# Patient Record
Sex: Female | Born: 1980 | Race: White | Hispanic: No | Smoking: Current some day smoker
Health system: Southern US, Community
[De-identification: ages and names within clinical notes are randomized; demographics above are authoritative.]

## PROBLEM LIST (undated history)

## (undated) DIAGNOSIS — G43909 Migraine, unspecified, not intractable, without status migrainosus: Secondary | ICD-10-CM

## (undated) DIAGNOSIS — Z9221 Personal history of antineoplastic chemotherapy: Secondary | ICD-10-CM

## (undated) DIAGNOSIS — D649 Anemia, unspecified: Secondary | ICD-10-CM

## (undated) DIAGNOSIS — Z923 Personal history of irradiation: Secondary | ICD-10-CM

## (undated) DIAGNOSIS — C801 Malignant (primary) neoplasm, unspecified: Secondary | ICD-10-CM

## (undated) DIAGNOSIS — Z8541 Personal history of malignant neoplasm of cervix uteri: Secondary | ICD-10-CM

## (undated) DIAGNOSIS — N189 Chronic kidney disease, unspecified: Secondary | ICD-10-CM

## (undated) DIAGNOSIS — N135 Crossing vessel and stricture of ureter without hydronephrosis: Secondary | ICD-10-CM

## (undated) HISTORY — PX: MANDIBLE FRACTURE SURGERY: SHX706

## (undated) HISTORY — PX: CHOLECYSTECTOMY: SHX55

---

## 2003-01-31 ENCOUNTER — Emergency Department (HOSPITAL_COMMUNITY): Admission: EM | Admit: 2003-01-31 | Discharge: 2003-01-31 | Payer: Self-pay | Admitting: Emergency Medicine

## 2003-02-22 ENCOUNTER — Encounter: Payer: Self-pay | Admitting: *Deleted

## 2003-02-22 ENCOUNTER — Emergency Department (HOSPITAL_COMMUNITY): Admission: EM | Admit: 2003-02-22 | Discharge: 2003-02-22 | Payer: Self-pay | Admitting: *Deleted

## 2003-04-09 ENCOUNTER — Inpatient Hospital Stay (HOSPITAL_COMMUNITY): Admission: EM | Admit: 2003-04-09 | Discharge: 2003-04-11 | Payer: Self-pay | Admitting: Emergency Medicine

## 2003-04-09 HISTORY — PX: INCISION AND DRAINAGE ABSCESS: SHX5864

## 2004-09-25 ENCOUNTER — Emergency Department (HOSPITAL_COMMUNITY): Admission: EM | Admit: 2004-09-25 | Discharge: 2004-09-25 | Payer: Self-pay | Admitting: Emergency Medicine

## 2007-01-13 ENCOUNTER — Emergency Department (HOSPITAL_COMMUNITY): Admission: EM | Admit: 2007-01-13 | Discharge: 2007-01-13 | Payer: Self-pay | Admitting: Emergency Medicine

## 2007-05-07 ENCOUNTER — Emergency Department (HOSPITAL_COMMUNITY): Admission: EM | Admit: 2007-05-07 | Discharge: 2007-05-07 | Payer: Self-pay | Admitting: Emergency Medicine

## 2007-09-24 ENCOUNTER — Emergency Department (HOSPITAL_COMMUNITY): Admission: EM | Admit: 2007-09-24 | Discharge: 2007-09-24 | Payer: Self-pay | Admitting: Emergency Medicine

## 2007-10-03 ENCOUNTER — Emergency Department (HOSPITAL_COMMUNITY): Admission: EM | Admit: 2007-10-03 | Discharge: 2007-10-04 | Payer: Self-pay | Admitting: Emergency Medicine

## 2007-10-14 ENCOUNTER — Emergency Department (HOSPITAL_COMMUNITY): Admission: EM | Admit: 2007-10-14 | Discharge: 2007-10-14 | Payer: Self-pay | Admitting: Emergency Medicine

## 2007-10-23 ENCOUNTER — Emergency Department (HOSPITAL_COMMUNITY): Admission: EM | Admit: 2007-10-23 | Discharge: 2007-10-23 | Payer: Self-pay | Admitting: Emergency Medicine

## 2007-10-28 ENCOUNTER — Emergency Department (HOSPITAL_COMMUNITY): Admission: EM | Admit: 2007-10-28 | Discharge: 2007-10-29 | Payer: Self-pay | Admitting: Emergency Medicine

## 2007-11-12 ENCOUNTER — Emergency Department (HOSPITAL_COMMUNITY): Admission: EM | Admit: 2007-11-12 | Discharge: 2007-11-12 | Payer: Self-pay | Admitting: Emergency Medicine

## 2007-11-24 ENCOUNTER — Emergency Department (HOSPITAL_COMMUNITY): Admission: EM | Admit: 2007-11-24 | Discharge: 2007-11-24 | Payer: Self-pay | Admitting: Emergency Medicine

## 2007-12-29 ENCOUNTER — Emergency Department (HOSPITAL_COMMUNITY): Admission: EM | Admit: 2007-12-29 | Discharge: 2007-12-29 | Payer: Self-pay | Admitting: Emergency Medicine

## 2008-02-10 ENCOUNTER — Emergency Department (HOSPITAL_COMMUNITY): Admission: EM | Admit: 2008-02-10 | Discharge: 2008-02-10 | Payer: Self-pay | Admitting: Emergency Medicine

## 2008-04-28 ENCOUNTER — Emergency Department (HOSPITAL_COMMUNITY): Admission: EM | Admit: 2008-04-28 | Discharge: 2008-04-28 | Payer: Self-pay | Admitting: Emergency Medicine

## 2008-08-03 ENCOUNTER — Emergency Department (HOSPITAL_COMMUNITY): Admission: EM | Admit: 2008-08-03 | Discharge: 2008-08-03 | Payer: Self-pay | Admitting: Emergency Medicine

## 2008-09-21 IMAGING — CR DG ABDOMEN ACUTE W/ 1V CHEST
3 series · 3 of 3 positions shown · non-contrast
Comparison: No abdomen films.  Chest x-ray 01/13/2007

CLINICAL DATA: Abdominal pain, nausea and vomiting

ACUTE ABDOMEN SERIES (ABDOMEN 2 VIEW & CHEST 1 VIEW)

[view not recorded (1 of 3)]
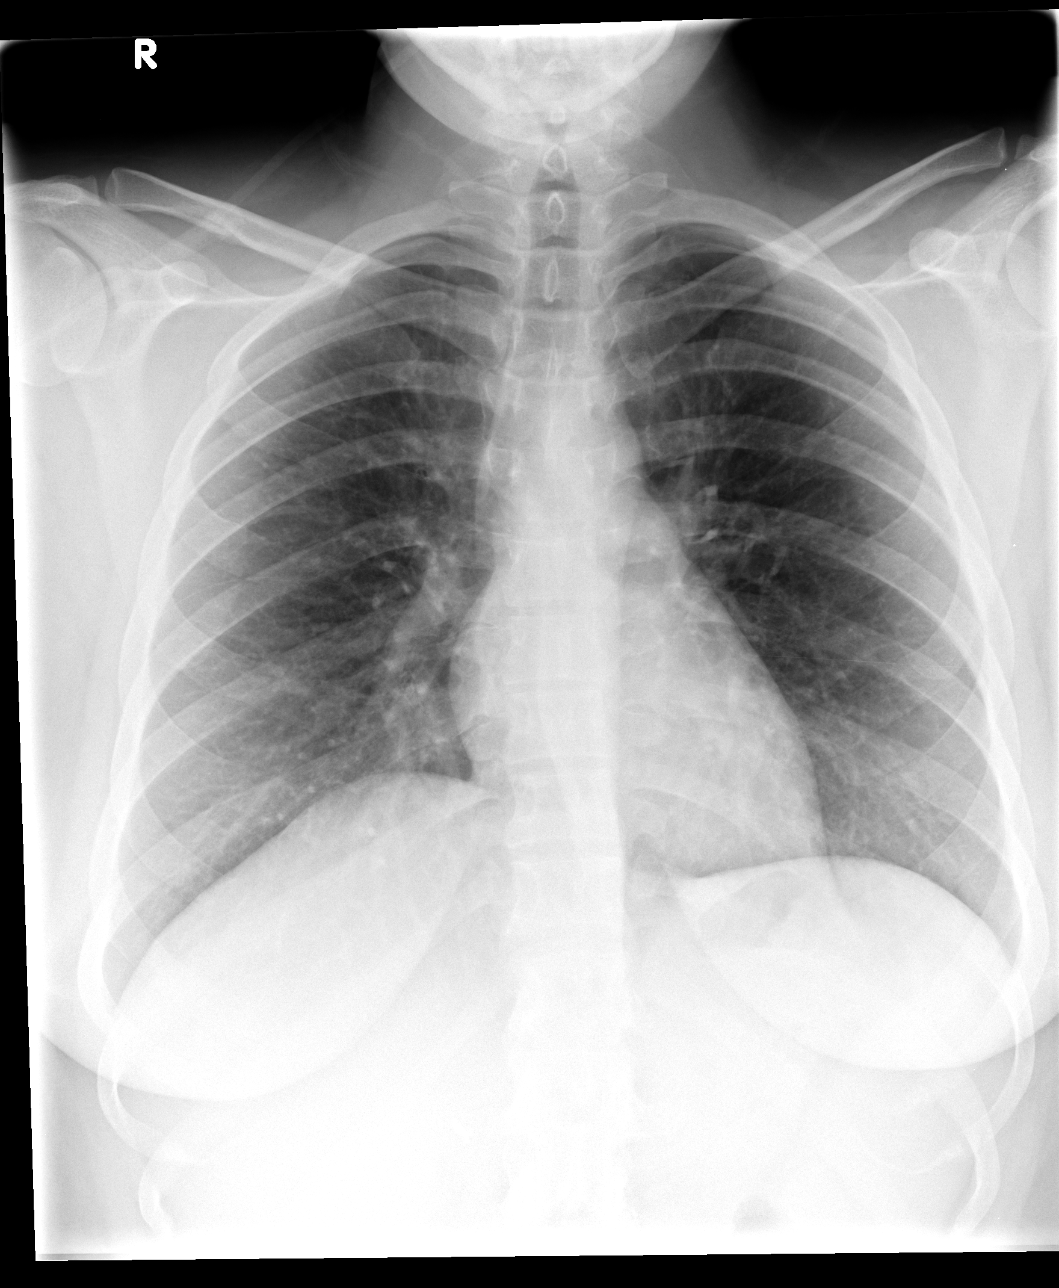

[view not recorded (2 of 3)]
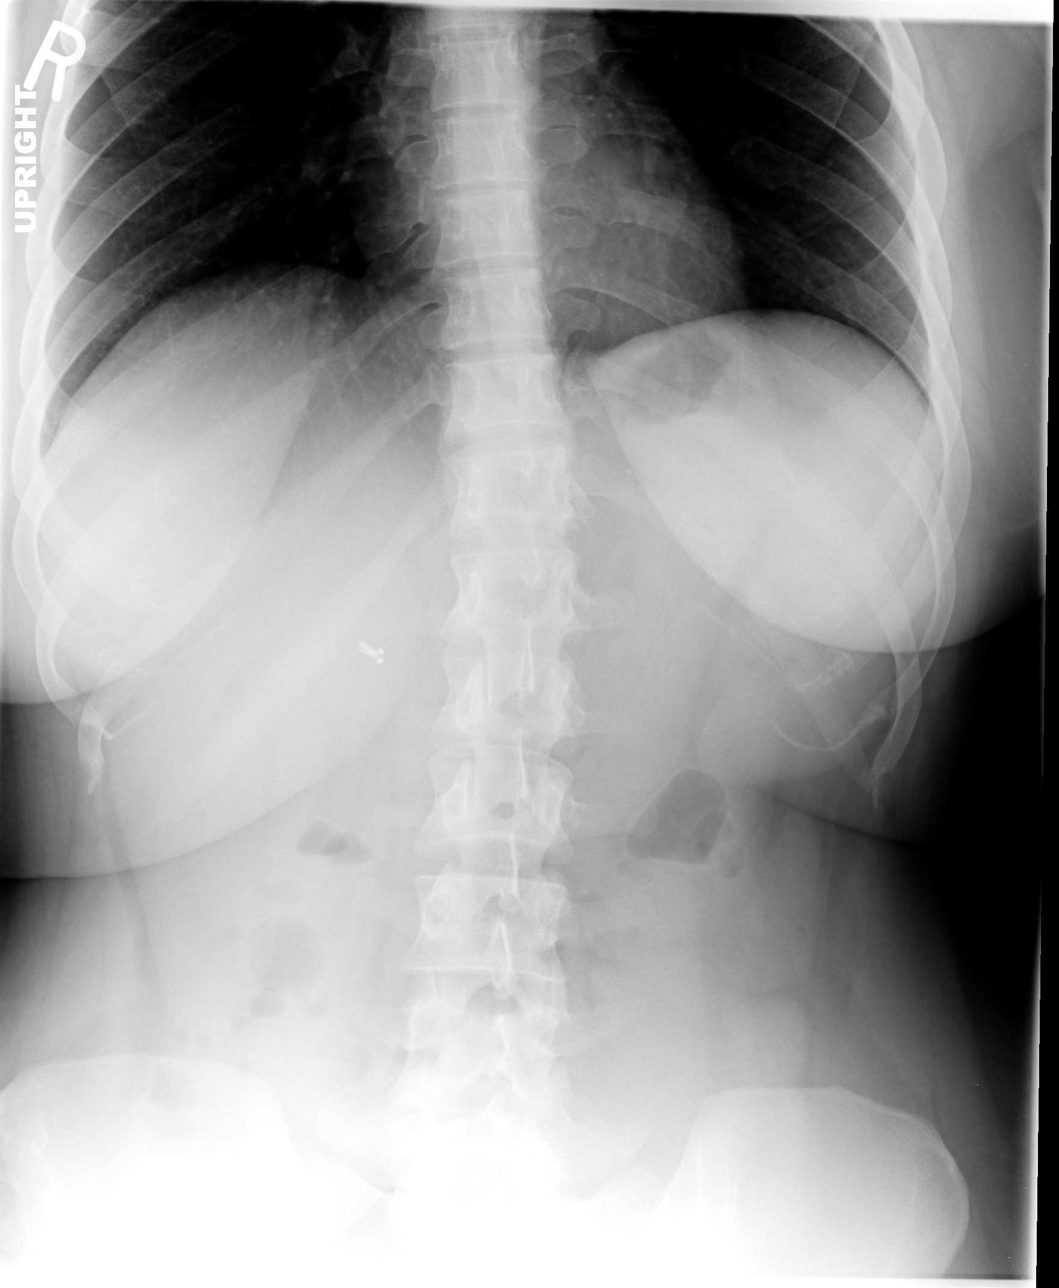

[view not recorded (3 of 3)]
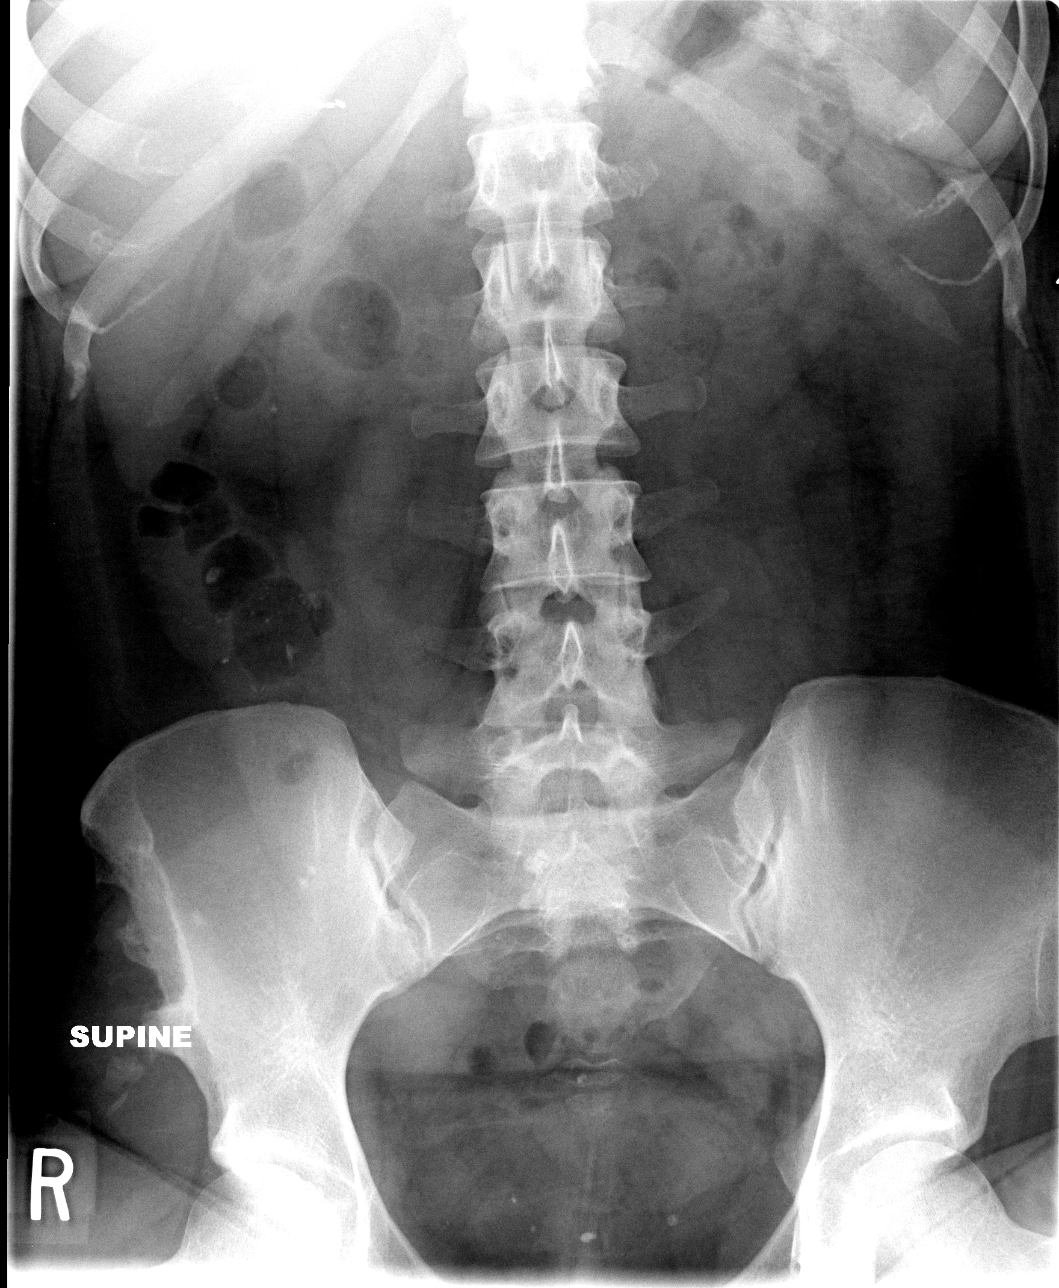

[3 of 3 positions shown; findings below may reference images not displayed]

FINDINGS: The lungs are clear.  The cardiac and mediastinal
silhouette is normal.  There is no bony abnormality. No change from
prior.

Intestinal gas pattern nonobstructive.  No evidence for free air.
Previous cholecystectomy.  Radiopaque material overlies the colon
on the right, likely retained barium or ingested food.  No acute
osseous findings. Popcorn like calcification overlying the right
sacrum likely represents a small calcified lymph node.
IMPRESSION: Negative.

## 2008-10-09 ENCOUNTER — Emergency Department (HOSPITAL_COMMUNITY): Admission: EM | Admit: 2008-10-09 | Discharge: 2008-10-09 | Payer: Self-pay | Admitting: Emergency Medicine

## 2009-08-31 DIAGNOSIS — Z8541 Personal history of malignant neoplasm of cervix uteri: Secondary | ICD-10-CM

## 2009-08-31 HISTORY — DX: Personal history of malignant neoplasm of cervix uteri: Z85.41

## 2010-11-10 LAB — DIFFERENTIAL
Basophils Absolute: 0 10*3/uL (ref 0.0–0.1)
Lymphocytes Relative: 15 % (ref 12–46)
Monocytes Absolute: 0.2 10*3/uL (ref 0.1–1.0)
Monocytes Relative: 2 % — ABNORMAL LOW (ref 3–12)
Neutro Abs: 9.6 10*3/uL — ABNORMAL HIGH (ref 1.7–7.7)

## 2010-11-10 LAB — BASIC METABOLIC PANEL
Calcium: 9.5 mg/dL (ref 8.4–10.5)
GFR calc Af Amer: >60 mL/min (ref >60)
GFR calc non Af Amer: >60 mL/min (ref >60)
Sodium: 141 mEq/L (ref 135–145)

## 2010-11-10 LAB — CBC
Hemoglobin: 13.2 g/dL (ref 12.0–15.0)
RBC: 4.22 MIL/uL (ref 3.87–5.11)
RDW: 14.1 % (ref 11.5–15.5)
WBC: 11.5 10*3/uL — ABNORMAL HIGH (ref 4.0–10.5)

## 2010-11-10 LAB — URINE MICROSCOPIC-ADD ON

## 2010-11-10 LAB — URINALYSIS, ROUTINE W REFLEX MICROSCOPIC
Hgb urine dipstick: NEGATIVE
Specific Gravity, Urine: >1.03 — ABNORMAL HIGH (ref 1.005–1.030)
Urobilinogen, UA: 0.2 mg/dL (ref 0.0–1.0)

## 2010-11-10 LAB — RAPID URINE DRUG SCREEN, HOSP PERFORMED
Amphetamines: NOT DETECTED
Barbiturates: NOT DETECTED
Tetrahydrocannabinol: POSITIVE — AB

## 2010-12-16 NOTE — Op Note (Signed)
NAME:  Brewer, Marie                         ACCOUNT NO.:  [id number]   MEDICAL RECORD NO.:  [id number]                   PATIENT TYPE:  INP   LOCATION:  6123                                 FACILITY:  [facility]   PHYSICIAN:  Kathlene November., D.D.S.        DATE OF BIRTH:  [birth year]   DATE OF PROCEDURE:  04/09/2003  DATE OF DISCHARGE:                                 OPERATIVE REPORT   PREOPERATIVE DIAGNOSIS:  Right submandibular space abscess with multiple  necrotic and abscessed teeth including teeth numbers 30, 31, 32, 17, 18, and  19.   POSTOPERATIVE DIAGNOSIS:  Right submandibular space abscess with multiple  necrotic and abscessed teeth including teeth numbers 30, 31, 32, 17, 18, and  19.   OPERATION PERFORMED:  Extraoral incision and drainage of right submandibular  and masticator spaces and extraction of teeth 17, 18, 19, 30, 31 and 32.   SURGEON:  Verneda Skill, D.D.S.   ANESTHESIA:  General orotracheal anesthesia.   INDICATIONS FOR PROCEDURE:  Marie Brewer is an otherwise healthy 30 year old female  who presented to the [facility] Emergency Department early in the morning of  April 09, 2003 with massive right facial swelling and pain.  Prompt  surgical incision and drainage was therefore indicated.   DESCRIPTION OF PROCEDURE:  The patient was brought to the operating room in  satisfactory preoperative condition, placed on the operating room table in  the supine position.  Following successful induction of general anesthesia  via oral endotracheal intubation the patient was prepped and draped in the  usual sterile fashion for a procedure of this type.  Initially, the oral  cavity was thoroughly irrigated with normal saline and suctioned dry.  Next,  an oropharyngeal throat pack was placed which was removed at the conclusion  of the case.  Following this, attention was directed extraorally into the  right submandibular region.  Approximately 72mL of a 2% lidocaine  solution  with 1:100,000 epinephrine was infiltrated into the subcutaneous tissues.  Next, a #15 scalpel was used to create an incision extending approximately 1  cm in length and this was created about 3 cm inferior to the right angle of  the mandible in the neck.  Blunt dissection was then carried out with a  hemostat through the skin and subcutaneous tissues and the hemostat was  directed superiorly to the inferior border of the mandible.  Upon  advancement of the hemostat, copious purulent material exited through the  incision. Approximately 7 to 43mL of purulent drainage was obtained.  Loculations were broken up both medially and lateral to the angle of the  mandible.  Next, attention was directed intraorally where teeth numbers 30,  31, and impacted 32 were then surgically removed.  The operative site was  then thoroughly irrigated and closed intraorally with 3-0 interrupted  chromic gut sutures.  A 6 cm x 1/2 inch Penrose drain was then placed into  the  extraoral wound and sutured to the skin at the exit with 4-0 nylon  sutures.  A sterile dressing was then applied over the drainage site.  Attention was then directed contralaterally, local anesthesia was also  administered intraorally.  Approximately 11mL of local anesthetic was  injected in total and teeth numbers 17, 18 and 19 which were necrotic were  also surgically removed.  The operative  site again was debrided, irrigated and sutured with interrupted 3-0 chromic  gut sutures.  This completed the procedure.  The throat pack was removed and  the patient was allowed to recover from general anesthesia and was  transported to the post anesthesia care unit in satisfactory postoperative  condition.                                                Kathlene November., D.D.S.    JWB/MEDQ  D:  04/09/2003  T:  04/09/2003  Job:  [id number]

## 2010-12-16 NOTE — Discharge Summary (Signed)
   NAME:  Marie Brewer, Marie Brewer                         ACCOUNT NO.:  [id number]   MEDICAL RECORD NO.:  [id number]                   PATIENT TYPE:  INP   LOCATION:  6123                                 FACILITY:  [facility]   PHYSICIAN:  Kathlene November., D.D.S.        DATE OF BIRTH:  [birth year]   DATE OF ADMISSION:  04/08/2003  DATE OF DISCHARGE:  04/11/2003                                 DISCHARGE SUMMARY   HOSPITAL COURSE:  Marie Brewer was initially seen at [facility] in  [place], [place], by ENT, Dr. Tamala Julian, on April 08, 2003.  She was then  referred to [facility] as they were not equipped to  treat her.   She presented to the emergency department at [facility]  at approximately 2:30 a.m. on April 09, 2003, with massive right facial  and pharyngeal swelling.  Immediate treatment was consequently indicated.  The patient had a CT scan which revealed a large cystic abscess in the right  angle of the mandible region extending medially and inferior to the  mandible.   Subsequently, she was taken to the operating room and incision and drainage  was performed extraorally.  Necrotic teeth 17, 18, 19, 30, 31 and 32 were  surgically removed.  The operative site was thoroughly irrigated and a half  inch Penrose drain, 6 cm in length, was then sutured at the exit wound of  the skin approximately 3 cm inferior to the right angle of the mandible  inferior border.  The patient was then treated with IV antibiotics,  Clindamycin 600 mg q.6h., for the following two days.  She defervesced,  facial swelling was reduced.  Clinically she improved.  She had no dysphagia  and minimal trismus upon discharge on Saturday, April 11, 2003.   She was given home care instructions including maintaining a sterile  dressing over the drain, p.o. saline rinses q.i.d., chlorhexidine p.o.  rinses b.i.d., clindamycin 300 mg p.o. q.6h. and Vicodin one to two  tablets  q.6h. p.r.n. pain.  She was instructed to call the office on Monday,  April 13, 2003, for a follow-up appointment at [phone].                                                Kathlene November., D.D.S.    JWB/MEDQ  D:  04/13/2003  T:  04/13/2003  Job:  [id number]

## 2011-04-24 LAB — URINALYSIS, ROUTINE W REFLEX MICROSCOPIC
Glucose, UA: NEGATIVE
Nitrite: NEGATIVE
Urobilinogen, UA: 0.2

## 2011-04-24 LAB — URINE MICROSCOPIC-ADD ON

## 2011-04-24 LAB — PREGNANCY, URINE: Preg Test, Ur: NEGATIVE

## 2011-04-25 LAB — DIFFERENTIAL
Basophils Absolute: 0
Basophils Relative: 0
Eosinophils Absolute: 0.1
Monocytes Absolute: 0.7
Monocytes Relative: 4
Neutro Abs: 16.7 — ABNORMAL HIGH

## 2011-04-25 LAB — URINALYSIS, ROUTINE W REFLEX MICROSCOPIC
Bilirubin Urine: NEGATIVE
Glucose, UA: NEGATIVE
Hgb urine dipstick: NEGATIVE
Ketones, ur: NEGATIVE

## 2011-04-25 LAB — URINE MICROSCOPIC-ADD ON

## 2011-04-25 LAB — COMPREHENSIVE METABOLIC PANEL
ALT: 41 — ABNORMAL HIGH
Albumin: 4
Alkaline Phosphatase: 72
BUN: 14
Chloride: 102
Glucose, Bld: 134 — ABNORMAL HIGH
Potassium: 3.7
Sodium: 137
Total Bilirubin: 0.7

## 2011-04-25 LAB — CBC
HCT: 42.5
Hemoglobin: 14.9
Platelets: 279
WBC: 18 — ABNORMAL HIGH

## 2011-10-23 ENCOUNTER — Emergency Department (HOSPITAL_COMMUNITY)
Admission: EM | Admit: 2011-10-23 | Discharge: 2011-10-24 | Disposition: A | Discharge status: Home / Self Care | Payer: Medicaid Other | Attending: Emergency Medicine | Admitting: Emergency Medicine

## 2011-10-23 ENCOUNTER — Encounter (HOSPITAL_COMMUNITY): Payer: Self-pay | Admitting: Emergency Medicine

## 2011-10-23 DIAGNOSIS — G629 Polyneuropathy, unspecified: Secondary | ICD-10-CM

## 2011-10-23 DIAGNOSIS — G609 Hereditary and idiopathic neuropathy, unspecified: Secondary | ICD-10-CM | POA: Insufficient documentation

## 2011-10-23 DIAGNOSIS — G43909 Migraine, unspecified, not intractable, without status migrainosus: Secondary | ICD-10-CM | POA: Insufficient documentation

## 2011-10-23 DIAGNOSIS — R209 Unspecified disturbances of skin sensation: Secondary | ICD-10-CM | POA: Insufficient documentation

## 2011-10-23 DIAGNOSIS — F17200 Nicotine dependence, unspecified, uncomplicated: Secondary | ICD-10-CM | POA: Insufficient documentation

## 2011-10-23 HISTORY — DX: Migraine, unspecified, not intractable, without status migrainosus: G43.909

## 2011-10-23 HISTORY — DX: Personal history of malignant neoplasm of cervix uteri: Z85.41

## 2011-10-23 LAB — DIFFERENTIAL
Basophils Absolute: 0 10*3/uL (ref 0.0–0.1)
Basophils Relative: 0 % (ref 0–1)
Eosinophils Absolute: 0.2 10*3/uL (ref 0.0–0.7)
Eosinophils Relative: 2 % (ref 0–5)
Lymphocytes Relative: 27 % (ref 12–46)
Lymphs Abs: 2.3 K/uL (ref 0.7–4.0)
Monocytes Absolute: 0.7 10*3/uL (ref 0.1–1.0)
Monocytes Relative: 8 % (ref 3–12)
Neutro Abs: 5.3 K/uL (ref 1.7–7.7)
Neutrophils Relative %: 63 % (ref 43–77)

## 2011-10-23 LAB — BASIC METABOLIC PANEL WITH GFR
BUN: 13 mg/dL (ref 6–23)
Chloride: 101 meq/L (ref 96–112)
Glucose, Bld: 93 mg/dL (ref 70–99)
Potassium: 4.2 meq/L (ref 3.5–5.1)

## 2011-10-23 LAB — CBC
HCT: 37.9 % (ref 36.0–46.0)
Hemoglobin: 12.4 g/dL (ref 12.0–15.0)
MCH: 30.8 pg (ref 26.0–34.0)
MCHC: 32.7 g/dL (ref 30.0–36.0)
MCV: 94 fL (ref 78.0–100.0)
Platelets: 249 10*3/uL (ref 150–400)
RBC: 4.03 MIL/uL (ref 3.87–5.11)
RDW: 13.8 % (ref 11.5–15.5)
WBC: 8.5 10*3/uL (ref 4.0–10.5)

## 2011-10-23 LAB — BASIC METABOLIC PANEL
CO2: 31 mEq/L (ref 19–32)
Calcium: 10 mg/dL (ref 8.4–10.5)
Creatinine, Ser: 0.88 mg/dL (ref 0.50–1.10)
GFR calc Af Amer: >90 mL/min (ref >90)
GFR calc non Af Amer: 87 mL/min — ABNORMAL LOW (ref >90)
Sodium: 140 mEq/L (ref 135–145)

## 2011-10-23 NOTE — ED Notes (Signed)
PT. REPORTS MIGRAINE HEADACHE WITH RIGHT ARM/HAND NUMBNESS/TINGLING FOR SEVERAL DAYS , DENIES NAUSEA .

## 2011-10-24 ENCOUNTER — Encounter (HOSPITAL_COMMUNITY): Payer: Self-pay | Admitting: Emergency Medicine

## 2011-10-24 MED ORDER — KETOROLAC TROMETHAMINE 30 MG/ML IJ SOLN
30.0000 mg | Freq: Once | INTRAMUSCULAR | Status: AC
  Administered 2011-10-24: 30 mg via INTRAMUSCULAR
  Filled 2011-10-24: qty 1

## 2011-10-24 MED ORDER — METOCLOPRAMIDE HCL 5 MG/ML IJ SOLN
10.0000 mg | Freq: Once | INTRAMUSCULAR | Status: AC
  Administered 2011-10-24: 10 mg via INTRAMUSCULAR
  Filled 2011-10-24: qty 2

## 2011-10-24 MED ORDER — DIPHENHYDRAMINE HCL 25 MG PO CAPS
50.0000 mg | ORAL_CAPSULE | Freq: Once | ORAL | Status: AC
  Administered 2011-10-24: 50 mg via ORAL
  Filled 2011-10-24: qty 2

## 2011-10-24 NOTE — ED Notes (Signed)
Pt reports migraine since yesterday am.  States that she has vomited x several times, pt has photophobia.  Pt ambulatory without difficulty.  Hx of migraines.

## 2011-10-24 NOTE — ED Provider Notes (Addendum)
History     CSN: [id number]  Arrival date & time 10/23/11  2152   First MD Initiated Contact with Patient 10/24/11 0119      Chief Complaint  Patient presents with  . Migraine    (Consider location/radiation/quality/duration/timing/severity/associated sxs/prior treatment) HPI Comments: Patient reports that she's had intermittent tingling and numbness to her right hand and wrist and part of her forearm. She reports no trauma. She denies headache until she developed a typical migraine this morning. She denies weakness. She reports that she does not use her right arm excessively and does not currently work. She denies weakness. She denies numbness or weakness to her face, legs. She reports that today she has had some nausea, photophobia with pain at her temples similar to prior migraines. She reports that she has not had a migraine in several years however. She denies any recent trauma. She has a significant history of cervical cancer that is currently in remission. She reports it was stage II that she was treated with radiation and chemotherapy. She reports no blurred or loss of vision. She reports the numbness and tingling seems worse when she wakes up from sleep.  She reports is starts at elbow, tracks along the top of her forearm only and into middle 3 fingers.    Patient is a 31 y.o. female presenting with migraine. The history is provided by the patient.  Migraine Associated symptoms include headaches. Pertinent negatives include no shortness of breath.    Past Medical History  Diagnosis Date  . History of cervical cancer   . Migraine headache     Past Surgical History  Procedure Date  . Mandible fracture surgery     History reviewed. No pertinent family history.  History  Substance Use Topics  . Smoking status: Current Everyday Smoker  . Smokeless tobacco: Not on file  . Alcohol Use: No    OB History    Grav Para Term Preterm Abortions TAB SAB Ect Mult Living          Review of Systems  Constitutional: Negative for fever and chills.  HENT: Negative for neck pain and neck stiffness.   Eyes: Positive for photophobia. Negative for pain, redness and visual disturbance.  Respiratory: Negative for shortness of breath.   Skin: Negative for rash.  Neurological: Positive for numbness and headaches. Negative for facial asymmetry and weakness.  All other systems reviewed and are negative.    Allergies  Augmentin; Imitrex; and Keflex  Home Medications   Current Outpatient Rx  Name Route Sig Dispense Refill  . ESTROGENS CONJUGATED 25 MG IJ SOLR Intravenous Inject 25 mg into the vein 2 (two) times a week.    . IBUPROFEN 200 MG PO TABS Oral Take 400 mg by mouth every 6 (six) hours as needed. For pain      BP 107/63  Pulse 74  Temp(Src) 98.4 F (36.9 C) (Oral)  Resp 20  SpO2 99%  Physical Exam  Nursing note and vitals reviewed. Constitutional: She is oriented to person, place, and time. She appears well-developed and well-nourished. No distress.  HENT:  Head: Normocephalic and atraumatic.  Eyes: Pupils are equal, round, and reactive to light. No scleral icterus.  Neck: Normal range of motion. Neck supple.  Cardiovascular: Normal rate.   Pulmonary/Chest: Effort normal.  Abdominal: Soft.  Musculoskeletal: Normal range of motion. She exhibits no edema and no tenderness.  Neurological: She is alert and oriented to person, place, and time. She has normal strength and normal  reflexes. No cranial nerve deficit. She exhibits normal muscle tone. Coordination normal. GCS eye subscore is 4. GCS verbal subscore is 5. GCS motor subscore is 6.  Skin: Skin is warm and dry. No rash noted.  Psychiatric: She has a normal mood and affect.    ED Course  Procedures (including critical care time)  Labs Reviewed  BASIC METABOLIC PANEL - Abnormal; Notable for the following:    GFR calc non Af Amer 87 (*)    All other components within normal limits  CBC    DIFFERENTIAL   No results found.   1. Peripheral neuropathy   2. Migraine       MDM  Pt reports numbness and tingling is just to middle 3 fingers, and along inner portion along radial side of forearm.  Appears to be along median nerve distribution, likely a peripheral neuropathy, she is told to follow up with PCP.  IM migraine cocktail given.  No fever or neck stiffness.  [place] for discharge to home.  Pt is reassured, agrees with pain.          Saddie Benders. Dorna Mai, MD 10/24/11 0140  Saddie Benders. Lenoir Facchini, MD 10/24/11 0140

## 2012-05-05 ENCOUNTER — Encounter (HOSPITAL_COMMUNITY): Payer: Self-pay | Admitting: *Deleted

## 2012-05-05 ENCOUNTER — Emergency Department (HOSPITAL_COMMUNITY)
Admission: EM | Admit: 2012-05-05 | Discharge: 2012-05-05 | Disposition: A | Discharge status: Home / Self Care | Payer: Self-pay | Attending: Emergency Medicine | Admitting: Emergency Medicine

## 2012-05-05 DIAGNOSIS — G5603 Carpal tunnel syndrome, bilateral upper limbs: Secondary | ICD-10-CM

## 2012-05-05 DIAGNOSIS — Z8541 Personal history of malignant neoplasm of cervix uteri: Secondary | ICD-10-CM | POA: Insufficient documentation

## 2012-05-05 DIAGNOSIS — F17200 Nicotine dependence, unspecified, uncomplicated: Secondary | ICD-10-CM | POA: Insufficient documentation

## 2012-05-05 DIAGNOSIS — G5600 Carpal tunnel syndrome, unspecified upper limb: Secondary | ICD-10-CM | POA: Insufficient documentation

## 2012-05-05 NOTE — Progress Notes (Signed)
Orthopedic Tech Progress Note Patient Details:  Marie Brewer 1981-02-22 [id number]  Ortho Devices Type of Ortho Device: Thumb velcro splint Ortho Device/Splint Location: RIGHT THUMB SPICA SPLINT Ortho Device/Splint Interventions: Application   Cammer, Theodoro Parma 05/05/2012, 12:20 PM

## 2012-05-05 NOTE — ED Notes (Signed)
Pt in c/o wrist pain and finger swelling and numbness over the last few months, states symptoms have worsened recently and they are worse at night.

## 2012-05-05 NOTE — ED Notes (Signed)
Ortho paged for thumb spica splint. 

## 2012-05-05 NOTE — ED Provider Notes (Signed)
History     CSN: [id number]  Arrival date & time 05/05/12  [id number]   First MD Initiated Contact with Patient 05/05/12 [phone]      Chief Complaint  Patient presents with  . Wrist Pain    (Consider location/radiation/quality/duration/timing/severity/associated sxs/prior treatment) The history is provided by the patient and medical records.    Marie Brewer is a 31 y.o. female presents to the emergency department complaining of tingling in hands and fingers.  The onset of the symptoms was  gradual starting 10 months ago.  The patient has associated paresthesias, weakness, difficulty writing.  The symptoms have been  intermittent, gradually worsened.  nothing makes the symptoms worse and hanging her hand off the side of the bed makes symptoms better.  The patient denies fever, chills, neck pain, headache, , abdominal pain, nausea, vomiting, diarrhea.  Pt states episodes began in January and came and went intermittently until July when the episode persisted.  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  Pt describes the pain as pins and needles with intermittent swelling of the finger.  Denies trauma, fall or known injury to the extremity.  Past Medical History  Diagnosis Date  . History of cervical cancer   . Migraine headache     Past Surgical History  Procedure Date  . Mandible fracture surgery     History reviewed. No pertinent family history.  History  Substance Use Topics  . Smoking status: Current Every Day Smoker  . Smokeless tobacco: Not on file  . Alcohol Use: No    OB History    Grav Para Term Preterm Abortions TAB SAB Ect Mult Living                  Review of Systems  Musculoskeletal: Positive for arthralgias. Negative for joint swelling.  Skin: Negative for color change and wound.  Neurological: Positive for weakness and numbness.  All other systems reviewed and are negative.    Allergies  Amoxicillin-pot clavulanate;  Imitrex; and Cephalexin  Home Medications   Current Outpatient Rx  Name Route Sig Dispense Refill  . IBUPROFEN 200 MG PO TABS Oral Take 400 mg by mouth every 6 (six) hours as needed. For pain      BP 135/57  Pulse 101  Temp 98 F (36.7 C) (Oral)  Resp 20  SpO2 100%  Physical Exam  Nursing note and vitals reviewed. Constitutional: She is oriented to person, place, and time. She appears well-developed and well-nourished. No distress.  HENT:  Head: Normocephalic and atraumatic.  Eyes: Conjunctivae normal are normal.  Cardiovascular: Normal rate, regular rhythm, normal heart sounds and intact distal pulses.  Exam reveals no gallop and no friction rub.   No murmur heard.      Capillary refill less than 3 seconds  Pulmonary/Chest: Effort normal and breath sounds normal. No respiratory distress. She has no wheezes. She has no rales.  Musculoskeletal: She exhibits no edema and no tenderness.       ROM: full ROM without pain  Neurological: She is alert and oriented to person, place, and time. She has normal reflexes. Coordination normal.       Sensation to dull and sharp intact Strength 5/5 in the wrist bilaterally; strong grip strength bilaterally Positive Tinel's and Phalen's - worse on the Right Negative Finkelstein test  Skin: Skin is warm and dry. No rash noted. She is not diaphoretic.    ED Course  Procedures (including critical care time)  Labs Reviewed - No data to display No results found.   1. Carpal tunnel syndrome on both sides       MDM  [name] presents for numbness, tingling and weakness in the R hand/wrist.  Pt with positive Tinel's and Phalen's signs, negative Finkelstein sign indicating carpal tunnel syndrome.  Pt with c/o paresthesias, but is neurologically intact.  No indication for imaging as there was no known injury and this has been of gradual onset.  Pt given a thumb spica and instructed to f/u with hand surgery.  This was all discussed with  the patient.  I have also discussed reasons to return immediately to the ER.  Patient expresses understanding and agrees with plan.   1. Medications: Usual home medications 2. Treatment: Rest, ice, wear brace 3. Follow Up: With Dr. Caralyn Guile with hand surgery.        Jarrett Soho Georganne Siple, PA-C 05/05/12 1718

## 2012-05-05 NOTE — ED Notes (Signed)
Does repetitive work as Scientist, water quality. C/o ongoing right wrist pain and tingling. Also states that left wrist has recently started hurting and tingling too.

## 2012-05-06 NOTE — ED Provider Notes (Signed)
Medical screening examination/treatment/procedure(s) were performed by non-physician practitioner and as supervising physician I was immediately available for consultation/collaboration.  Jasper Riling. Alvino Chapel, MD 05/06/12 [phone]

## 2013-07-08 ENCOUNTER — Ambulatory Visit: Payer: MEDICAID | Admitting: Gynecologic Oncology

## 2013-07-30 ENCOUNTER — Ambulatory Visit: Payer: BC Managed Care – PPO | Attending: Gynecologic Oncology | Admitting: Gynecologic Oncology

## 2013-07-30 ENCOUNTER — Encounter: Payer: Self-pay | Admitting: Gynecologic Oncology

## 2013-07-30 VITALS — BP 136/78 | HR 80 | Temp 99.0°F | Resp 20 | Ht 67.0 in | Wt 289.6 lb

## 2013-07-30 DIAGNOSIS — R635 Abnormal weight gain: Secondary | ICD-10-CM

## 2013-07-30 DIAGNOSIS — C539 Malignant neoplasm of cervix uteri, unspecified: Secondary | ICD-10-CM

## 2013-07-30 NOTE — Addendum Note (Signed)
Addended by: Joylene John D on: 07/30/2013 01:39 PM   Modules accepted: Orders

## 2013-07-30 NOTE — Patient Instructions (Addendum)
Return to clinic in 4 months to see Dr. Benjie Karvonen and return to see Dr. Alycia Rossetti in 8 months. Follow up with your other providers as scheduled.

## 2013-07-30 NOTE — Progress Notes (Signed)
Consult Note: Gyn-Onc  Marie Brewer 32 y.o. female  CC:  Chief Complaint  Patient presents with  . Cervical Cancer    Follow up    HPI: Patient is seen today in consultation request of Dr. Benjie Karvonen as a transfer care.   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 She was dispositioned to chemotherapy with radiation which she completed in June of 2011. She's been followed at [facility] since that time. The patient currently lives in [place] wished to transfer care as this is closer for her. Prior to her diagnosis the patient did have a history of high grade dysplasia in March of 2004. A LEEP was recommended that the patient did not followup. In September 2004 Pap smear again showed atypical squamous cells of undetermined significance but the patient was again lost to followup until she presented with heavy vaginal bleeding in February of 2011 to the emergency room [facility]. At that time and exam revealed an eroding ulcerating 4 cm necrotic cervical mass. Biopsies were performed that was consistent with squamous cell carcinoma cervix.  Pretreatment PET CT was negative for recurrent disease. Posttreatment PET scan was similarly unremarkable. She did undergo exam under anesthesia and biopsies 11/17/2012 that were negative. This was done secondary with significant pelvic pain as well as intermittent vaginal bleeding. Her last pelvic examination was in November 2014. Pap smear at that time revealed  low-grade dysplasia and was negative for high risk HPV.  The patient continues to have issues with pelvic pain. She been on methadone clinic and dispensed 6 tablets every week. She states that the methadone much better than any other pain regimen has for her in the past. It does cause her that $100 a week. She said that she is slowly been weaning herself as had she's  required less pain medication. She states that she has occasional pain with voiding and has a history of frequent UTIs does not had one in about a year. She does have occasional dyspareunia. Occasionally she'll have some spotting. She states she uses her dilator 2 days a week. She continues to smoke approximately 6 cigarettes per day. The biggest issue she has gained a fairly significant amount of weight. She states that prior to starting on methadone her baseline weight was about 150-160 pounds and now she weighs 289 pounds. She would be interested in seeing a nutritionist here at [facility].  Review of Systems:  Constitutional:  Denies fever. Skin: No rash, sores, jaundice, itching, or dryness.  Cardiovascular: No chest pain, shortness of breath, or edema  Pulmonary: No cough or wheeze.  Gastro Intestinal: No nausea, vomiting, constipation, or diarrhea reported. No bright red blood per rectum or change in bowel movement.  Genitourinary: No frequency, urgency, or dysuria.  Occ. vaginal bleeding,no discharge.  Musculoskeletal: No myalgia, arthralgia, joint swelling or pain.  Neurologic: No weakness, numbness, or change in gait.  Psychology: No changes    Current Meds:  Outpatient Encounter Prescriptions as of 07/30/2013  Medication Sig  . ibuprofen (ADVIL,MOTRIN) 200 MG tablet Take 400 mg by mouth every 6 (six) hours as needed. For pain  . METHADONE HCL PO Take 130 mg by mouth daily.    Allergy:  Allergies  Allergen Reactions  . Amoxicillin-Pot Clavulanate Anaphylaxis  . Imitrex [Sumatriptan Base] Other (See Comments)    Heart races  . Cephalexin Rash  Social Hx:   History   Social History  . Marital Status: Divorced    Spouse Name: N/A    Number of Children: N/A  . Years of Education: N/A   Occupational History  . Not on file.   Social History Main Topics  . Smoking status: Current Every Day Smoker  . Smokeless tobacco: Not on file  . Alcohol Use: No  . Drug Use: No   . Sexual Activity:    Other Topics Concern  . Not on file   Social History Narrative  . No narrative on file    Past Surgical Hx:  Past Surgical History  Procedure Laterality Date  . Mandible fracture surgery      Past Medical Hx:  Past Medical History  Diagnosis Date  . History of cervical cancer   . Migraine headache     Oncology Hx:    Cervical ca   09/27/2009 Initial Diagnosis Cervical ca, IIB SCCA diagnosed in [facility]    - 01/27/2010 Radiation Therapy Compelted chemorads and brachy   11/18/2010 Surgery EUA  and biopsies negative    Family Hx: No family history on file.  Vitals:  Blood pressure 136/78, pulse 80, temperature 99 F (37.2 C), temperature source Oral, resp. rate 20, height 5\' 7"  (1.702 m), weight 289 lb 9.6 oz (131.362 kg).  Physical Exam:  Well-nourished well-developed female in no acute distress.  Neck: Supple, no lymphadenopathy, no thyromegaly.  Lungs: Clear to auscultation bilaterally.  Cardiovascular: Regular rate and rhythm.  Abdomen: Morbidly obese, soft, nontender, and nondistended. There are no palpable masses or hepatosplenomegaly. Exam is limited by habitus.  Groins: No lymphadenopathy.  Extremities: No edema.  Pelvic: Normal external female genitalia. The vagina slightly atrophic. The cervix is agglutinated with the vagina. There are no visible lesions. Bimanual examination reveals an agglutinated cervix/vagina. There are no palpable mass or nodularity but exam is limited by habitus.  Assessment/Plan: 32 year old with history of a stage IIB squamous cell carcinoma the cervix treated with primarily chemoradiation at [facility]. She completed all therapy in June of 2011 has no evidence of recurrent disease. Secondary to her persistent pain will get her scheduled for CT scan of the abdomen and pelvis to rule out any evidence of recurrence. Clinically it sounds like her pain is slightly improving but still persistent. I  will contact her with these results. We'll get her scheduled to see a nutritionist at [place] long to assist with what she believes is methadone related weight gain. Her weight is the most doubled in the past several years which is fairly excessive. She will followup with Dr. Benjie Karvonen in 4 months return to see [place] in 8 months.  Adeleine Pask A., MD 07/30/2013, 11:19 AM

## 2013-08-08 ENCOUNTER — Ambulatory Visit (HOSPITAL_COMMUNITY)
Admission: RE | Admit: 2013-08-08 | Discharge: 2013-08-08 | Disposition: A | Discharge status: Home / Self Care | Payer: BC Managed Care – PPO | Source: Ambulatory Visit | Attending: Gynecologic Oncology | Admitting: Gynecologic Oncology

## 2013-08-11 ENCOUNTER — Telehealth: Payer: Self-pay | Admitting: *Deleted

## 2013-08-11 NOTE — Telephone Encounter (Signed)
Call to pt regarding no show to CT scan 1/9.Unable to reach pt, lmovm for pt to call back to r/s scan.

## 2013-08-13 ENCOUNTER — Telehealth: Payer: Self-pay | Admitting: *Deleted

## 2013-08-13 ENCOUNTER — Encounter: Payer: BC Managed Care – PPO | Admitting: Nutrition

## 2013-08-13 NOTE — Telephone Encounter (Signed)
Called Nutrition regarding pt referral appt, nutrition called pt on Jan 5 and lmovm for pt. Called pt today and lmovm ph# [phone] gave pt number for nutrition with message for pt to call and r/s her appt.

## 2013-08-13 NOTE — Telephone Encounter (Signed)
Pt called states " I r/s my CT scan for 1/23, I didn't make it to the last one as I overslept and the message to remind me was on a different phone number that I don't use anymore." Verified pt's phone number in system- [phone]- pt verbalized this is the correct # to contact her at. Pt also requested to r/s her nutrition appt as this was missed as well. Will contact nutrition to r/s pt appt.

## 2013-08-18 ENCOUNTER — Ambulatory Visit: Payer: BC Managed Care – PPO | Admitting: Dietician

## 2013-08-18 ENCOUNTER — Telehealth: Payer: Self-pay | Admitting: Gynecologic Oncology

## 2013-08-18 NOTE — Telephone Encounter (Signed)
Spoke with the patient about request for a different site for nutrition education and management.  Spoke with Ernestene Kiel, who recommended the patient call Iver Nestle with [facility].  Contact information given to the patient.  Patient advised to call the office for any questions or concerns.

## 2013-08-22 ENCOUNTER — Ambulatory Visit (HOSPITAL_COMMUNITY): Admission: RE | Admit: 2013-08-22 | Payer: BC Managed Care – PPO | Source: Ambulatory Visit

## 2013-10-02 ENCOUNTER — Ambulatory Visit: Payer: BC Managed Care – PPO | Admitting: Family Medicine

## 2015-06-11 ENCOUNTER — Emergency Department (INDEPENDENT_AMBULATORY_CARE_PROVIDER_SITE_OTHER)
Admission: EM | Admit: 2015-06-11 | Discharge: 2015-06-11 | Disposition: A | Discharge status: Home / Self Care | Payer: No Typology Code available for payment source | Source: Home / Self Care

## 2015-06-11 ENCOUNTER — Encounter (HOSPITAL_COMMUNITY): Payer: Self-pay | Admitting: Emergency Medicine

## 2015-06-11 DIAGNOSIS — I8312 Varicose veins of left lower extremity with inflammation: Secondary | ICD-10-CM | POA: Diagnosis not present

## 2015-06-11 MED ORDER — SULFAMETHOXAZOLE-TRIMETHOPRIM 800-160 MG PO TABS: 1.0000 | ORAL_TABLET | Freq: Two times a day (BID) | ORAL | Status: AC

## 2015-06-11 MED ORDER — SILVER SULFADIAZINE 1 % EX CREA: 1.0000 "application " | TOPICAL_CREAM | Freq: Every day | CUTANEOUS | Status: DC

## 2015-06-11 NOTE — ED Provider Notes (Signed)
CSN: [id number]     Arrival date & time 06/11/15  1422 History   None    Chief Complaint  Patient presents with  . Leg Swelling   (Consider location/radiation/quality/duration/timing/severity/associated sxs/prior Treatment) Patient is a 34 y.o. female presenting with rash. The history is provided by the patient.  Rash Location:  Leg Leg rash location:  L lower leg Quality: blistering, redness and swelling   Severity:  Moderate Onset quality:  Gradual Duration:  2 weeks Progression:  Unchanged Chronicity:  Recurrent (similar event 110mo ago.) Context comment:  Stands 18hr on job   Past Medical History  Diagnosis Date  . History of cervical cancer   . Migraine headache    Past Surgical History  Procedure Laterality Date  . Mandible fracture surgery     No family history on file. Social History  Substance Use Topics  . Smoking status: Current Every Day Smoker  . Smokeless tobacco: None  . Alcohol Use: No   OB History    No data available     Review of Systems  Constitutional: Negative.   Musculoskeletal: Positive for gait problem.  Skin: Positive for rash.  All other systems reviewed and are negative.   Allergies  Amoxicillin-pot clavulanate; Imitrex; and Cephalexin  Home Medications   Prior to Admission medications   Medication Sig Start Date End Date Taking? Authorizing Provider  ibuprofen (ADVIL,MOTRIN) 200 MG tablet Take 400 mg by mouth every 6 (six) hours as needed. For pain    Historical Provider, MD  METHADONE HCL PO Take 130 mg by mouth daily.    Historical Provider, MD  silver sulfADIAZINE (SILVADENE) 1 % cream Apply 1 application topically daily. Thin layer after washing tid 06/11/15   Billy Fischer, MD  sulfamethoxazole-trimethoprim (BACTRIM DS,SEPTRA DS) 800-160 MG tablet Take 1 tablet by mouth 2 (two) times daily. 06/11/15 06/18/15  Billy Fischer, MD   Meds Ordered and Administered this Visit  Medications - No data to display  BP 104/71 mmHg   Pulse 73  Temp(Src) 98.1 F (36.7 C) (Oral)  Resp 16  SpO2 98% No data found.   Physical Exam  Constitutional: She is oriented to person, place, and time. She appears well-developed and well-nourished. She appears distressed.  Musculoskeletal: She exhibits edema and tenderness.  Neurological: She is alert and oriented to person, place, and time.  Skin: Skin is warm and dry. Rash noted. There is erythema.  Blistering erythema and edema to left lower leg, sl warmth  Nursing note and vitals reviewed.   ED Course  Procedures (including critical care time)  Labs Review Labs Reviewed - No data to display  Imaging Review No results found.   Visual Acuity Review  Right Eye Distance:   Left Eye Distance:   Bilateral Distance:    Right Eye Near:   Left Eye Near:    Bilateral Near:         MDM   1. Acute stasis dermatitis, left        Billy Fischer, MD 06/11/15 1525

## 2015-06-11 NOTE — Discharge Instructions (Signed)
Wash 3 times a day, you must wear support hose, see your doctor as needed.

## 2015-06-11 NOTE — ED Notes (Signed)
Reports a 5 month history of left lower leg swelling and blisters.  Reports it seemed to be healing.  2 weeks ago, leg condition worsened again.

## 2016-08-01 ENCOUNTER — Emergency Department (HOSPITAL_COMMUNITY)
Admission: EM | Admit: 2016-08-01 | Discharge: 2016-08-01 | Disposition: A | Discharge status: Home / Self Care | Payer: Worker's Compensation | Attending: Emergency Medicine | Admitting: Emergency Medicine

## 2016-08-01 ENCOUNTER — Emergency Department (HOSPITAL_COMMUNITY): Payer: Worker's Compensation

## 2016-08-01 ENCOUNTER — Encounter (HOSPITAL_COMMUNITY): Payer: Self-pay

## 2016-08-01 DIAGNOSIS — Y929 Unspecified place or not applicable: Secondary | ICD-10-CM | POA: Diagnosis not present

## 2016-08-01 DIAGNOSIS — Y939 Activity, unspecified: Secondary | ICD-10-CM | POA: Insufficient documentation

## 2016-08-01 DIAGNOSIS — M62838 Other muscle spasm: Secondary | ICD-10-CM | POA: Diagnosis not present

## 2016-08-01 DIAGNOSIS — M25512 Pain in left shoulder: Secondary | ICD-10-CM | POA: Diagnosis present

## 2016-08-01 DIAGNOSIS — Y990 Civilian activity done for income or pay: Secondary | ICD-10-CM | POA: Insufficient documentation

## 2016-08-01 DIAGNOSIS — F17200 Nicotine dependence, unspecified, uncomplicated: Secondary | ICD-10-CM | POA: Insufficient documentation

## 2016-08-01 DIAGNOSIS — W228XXA Striking against or struck by other objects, initial encounter: Secondary | ICD-10-CM | POA: Diagnosis not present

## 2016-08-01 NOTE — Discharge Instructions (Signed)
Please continue taking your Aleve at home as needed for pain. Use warm compress on the area to help relieve pain.  SEEK MEDICAL CARE IF:  Your cramps or spasms get more severe, more frequent, or do not improve over time.

## 2016-08-01 NOTE — ED Triage Notes (Signed)
Pt presents for evaluation of L shoulder pain beginning Saturday following metal poles falling on shoulder while working. Pt denies LOC associated with event. Pt denies bruising to L shoulder, full ROM. Reports soreness, states job will not let her return until examined. Pt. AxO x4.

## 2016-08-01 NOTE — ED Notes (Signed)
Patient transported to X-ray 

## 2016-08-01 NOTE — ED Provider Notes (Signed)
[facility] DEPT Provider Note   CSN: [id number] Arrival date & time: 08/01/16  4098  By signing my name below, I, Soijett Blue, attest that this documentation has been prepared under the direction and in the presence of Heath Lark, PA-C Electronically Signed: Soijett Blue, ED Scribe. 08/01/16. 1:07 PM.  History   Chief Complaint Chief Complaint  Patient presents with  . Shoulder Pain    HPI Marie Brewer is a 36 y.o. female who presents to the Emergency Department complaining of 5/10, gradually improving, left shoulder pain onset 3 days ago. Pt notes that she was at work when metal pans and 10 gallon tubs fell, striking her left upper back and left shoulder prior to the onset of her symptoms. Pt states that she is in the ED today due to her place of employment requesting a work note. She has tried aleve with relief of her symptoms. She denies numbness, tingling, swelling, CP, SOB, fever, chills, nausea, vomiting, diarrhea, and any other symptoms.    The history is provided by the patient. No language interpreter was used.    Past Medical History:  Diagnosis Date  . History of cervical cancer   . Migraine headache     Patient Active Problem List   Diagnosis Date Noted  . Cervical ca ([place]) 07/30/2013    Past Surgical History:  Procedure Laterality Date  . MANDIBLE FRACTURE SURGERY      OB History    No data available       Home Medications    Prior to Admission medications   Medication Sig Start Date End Date Taking? Authorizing Provider  ibuprofen (ADVIL,MOTRIN) 200 MG tablet Take 400 mg by mouth every 6 (six) hours as needed. For pain    Historical Provider, MD  METHADONE HCL PO Take 130 mg by mouth daily.    Historical Provider, MD  silver sulfADIAZINE (SILVADENE) 1 % cream Apply 1 application topically daily. Thin layer after washing tid 06/11/15   Billy Fischer, MD    Family History No family history on file.  Social History Social History  Substance  Use Topics  . Smoking status: Current Every Day Smoker  . Smokeless tobacco: Not on file  . Alcohol use No     Allergies   Amoxicillin-pot clavulanate; Imitrex [sumatriptan base]; and Cephalexin   Review of Systems Review of Systems  Constitutional: Negative for chills and fever.  Respiratory: Negative for shortness of breath.   Cardiovascular: Negative for chest pain.  Gastrointestinal: Negative for diarrhea, nausea and vomiting.  Musculoskeletal: Positive for arthralgias (left shoulder) and back pain (left upper back). Negative for joint swelling.  Neurological: Negative for numbness.       No tingling     Physical Exam Updated Vital Signs BP 127/76 (BP Location: Right Arm)   Pulse (!) 57   Temp 98.4 F (36.9 C) (Oral)   Resp 20   Ht 5\' 5"  (1.651 m)   Wt 113.4 kg   SpO2 100%   BMI 41.60 kg/m   Physical Exam  Constitutional: She is oriented to person, place, and time. She appears well-developed and well-nourished. No distress.  HENT:  Head: Normocephalic and atraumatic.  Eyes: EOM are normal.  Neck: Neck supple.  Cardiovascular: Normal rate.   Pulmonary/Chest: Effort normal. No respiratory distress.  Abdominal: She exhibits no distension.  Musculoskeletal: Normal range of motion. She exhibits tenderness.       Left shoulder: She exhibits no tenderness, no bony tenderness, no swelling and no  deformity.  Left shoulder without erythema, edema, or deformities. Sensory intact bilaterally. Muscles strength 5/5. Negative Hawkins-Kennedy impingement test. Negative neer's test. Negative empty can test. No TTP of left shoulder. TTP of left upper back/trapezius.   Neurological: She is alert and oriented to person, place, and time.  Skin: Skin is warm and dry.  Psychiatric: She has a normal mood and affect. Her behavior is normal.  Nursing note and vitals reviewed.    ED Treatments / Results  DIAGNOSTIC STUDIES: Oxygen Saturation is 100% on RA, nl by my interpretation.      COORDINATION OF CARE: 1:01 PM Discussed treatment plan with pt at bedside which includes symptomatic treatment and pt agreed to plan.   Radiology Dg Shoulder Left  Result Date: 08/01/2016 CLINICAL DATA:  Fall onto left shoulder.  Left shoulder pain. EXAM: LEFT SHOULDER - 2+ VIEW COMPARISON:  None. FINDINGS: No acute bony abnormality. Specifically, no fracture, subluxation, or dislocation. Soft tissues are intact. Early spurring at the greater tuberosity at the rotator cuff insertion. IMPRESSION: No acute bony abnormality. Electronically Signed   By: Rolm Baptise M.D.   On: 08/01/2016 12:04    Procedures Procedures (including critical care time)  Medications Ordered in ED Medications - No data to display   Initial Impression / Assessment and Plan / ED Course  I have reviewed the triage vital signs and the nursing notes.  Pertinent imaging results that were available during my care of the patient were reviewed by me and considered in my medical decision making (see chart for details).  Clinical Course   Patient is a 36 year old female presenting with left upper back pain. She states she is just here to get medical clearance for work. On exam pt afebrile, VSS, NAD. Patient left shoulder not TTP to palpation. Full active and passive ROM. Left paraspinal/ trapezius TTP. No edema, erythema visualized. No midline, cervical, thoracic, or lumbar spine tenderness.  Patient X-Ray negative for obvious fracture or dislocation.  Pt advised to follow up with PCP. Conservative therapy recommended and discussed. Patient will be discharged home & is agreeable with above plan. Returns precautions discussed. Pt appears safe for discharge.  Final Clinical Impressions(s) / ED Diagnoses   Final diagnoses:  Muscle spasm    New Prescriptions Discharge Medication List as of 08/01/2016  1:02 PM     I personally performed the services described in this documentation, which was scribed in my presence. The  recorded information has been reviewed and is accurate.     [place], [place] 08/01/16 [place], MD 08/01/16 2028

## 2017-11-13 ENCOUNTER — Encounter (HOSPITAL_COMMUNITY): Payer: Self-pay | Admitting: Emergency Medicine

## 2017-11-13 ENCOUNTER — Emergency Department (HOSPITAL_COMMUNITY)
Admission: EM | Admit: 2017-11-13 | Discharge: 2017-11-13 | Disposition: A | Discharge status: Home / Self Care | Payer: Self-pay | Attending: Emergency Medicine | Admitting: Emergency Medicine

## 2017-11-13 ENCOUNTER — Emergency Department (HOSPITAL_COMMUNITY): Payer: Self-pay

## 2017-11-13 DIAGNOSIS — R109 Unspecified abdominal pain: Secondary | ICD-10-CM | POA: Insufficient documentation

## 2017-11-13 DIAGNOSIS — N390 Urinary tract infection, site not specified: Secondary | ICD-10-CM | POA: Insufficient documentation

## 2017-11-13 DIAGNOSIS — Z79899 Other long term (current) drug therapy: Secondary | ICD-10-CM | POA: Insufficient documentation

## 2017-11-13 DIAGNOSIS — F17210 Nicotine dependence, cigarettes, uncomplicated: Secondary | ICD-10-CM | POA: Insufficient documentation

## 2017-11-13 LAB — BASIC METABOLIC PANEL
ANION GAP: 8 (ref 5–15)
BUN: 14 mg/dL (ref 6–20)
CALCIUM: 9.3 mg/dL (ref 8.9–10.3)
CO2: 29 mmol/L (ref 22–32)
Chloride: 101 mmol/L (ref 101–111)
Creatinine, Ser: 0.8 mg/dL (ref 0.44–1.00)
GLUCOSE: 132 mg/dL — AB (ref 65–99)
Potassium: 4.6 mmol/L (ref 3.5–5.1)
Sodium: 138 mmol/L (ref 135–145)

## 2017-11-13 LAB — URINALYSIS, ROUTINE W REFLEX MICROSCOPIC
BACTERIA UA: NONE SEEN
BILIRUBIN URINE: NEGATIVE
Glucose, UA: NEGATIVE mg/dL
Ketones, ur: NEGATIVE mg/dL
Nitrite: NEGATIVE
Protein, ur: NEGATIVE mg/dL
SPECIFIC GRAVITY, URINE: 1.025 (ref 1.005–1.030)
pH: 6 (ref 5.0–8.0)

## 2017-11-13 LAB — CBC
HCT: 37.1 % (ref 36.0–46.0)
HEMOGLOBIN: 11.8 g/dL — AB (ref 12.0–15.0)
MCH: 28 pg (ref 26.0–34.0)
MCHC: 31.8 g/dL (ref 30.0–36.0)
MCV: 88.1 fL (ref 78.0–100.0)
PLATELETS: 294 10*3/uL (ref 150–400)
RBC: 4.21 MIL/uL (ref 3.87–5.11)
RDW: 13.7 % (ref 11.5–15.5)
WBC: 10 10*3/uL (ref 4.0–10.5)

## 2017-11-13 LAB — I-STAT BETA HCG BLOOD, ED (MC, WL, AP ONLY)

## 2017-11-13 MED ORDER — NITROFURANTOIN MONOHYD MACRO 100 MG PO CAPS: 100.0000 mg | ORAL_CAPSULE | Freq: Two times a day (BID) | ORAL | 0 refills | Status: DC

## 2017-11-13 NOTE — Discharge Instructions (Addendum)
Follow-up with your OB/GYN for further evaluation for the enlarged inguinal lymph nodes found on your CT.

## 2017-11-13 NOTE — ED Provider Notes (Signed)
[facility] EMERGENCY DEPARTMENT Provider Note   CSN: [id number] Arrival date & time: 11/13/17  3762     History   Chief Complaint Chief Complaint  Patient presents with  . Urinary Tract Infection    HPI Marie Brewer is a 37 y.o. female for the past medical history of cervical cancer, who presents to ED for evaluation of urinary frequency, dysuria and lower abdominal head she states that this feels similar to her prior UTIs.  Began 2 days ago.  She does not take any medications prior to arrival to help with symptoms.  Denies any fevers, back pain, history of kidney stones, hematuria, vaginal bleeding, vaginal discharge.  HPI  Past Medical History:  Diagnosis Date  . History of cervical cancer   . Migraine headache     Patient Active Problem List   Diagnosis Date Noted  . Cervical ca ([place]) 07/30/2013    Past Surgical History:  Procedure Laterality Date  . MANDIBLE FRACTURE SURGERY       OB History   None      Home Medications    Prior to Admission medications   Medication Sig Start Date End Date Taking? Authorizing Provider  ibuprofen (ADVIL,MOTRIN) 200 MG tablet Take 400 mg by mouth every 6 (six) hours as needed. For pain    [provider]  METHADONE HCL PO Take 130 mg by mouth daily.    [provider]  nitrofurantoin, macrocrystal-monohydrate, (MACROBID) 100 MG capsule Take 1 capsule (100 mg total) by mouth 2 (two) times daily. 11/13/17   Teresha Hanks, PA-C  silver sulfADIAZINE (SILVADENE) 1 % cream Apply 1 application topically daily. Thin layer after washing tid 06/11/15   Billy Fischer, MD    Family History History reviewed. No pertinent family history.  Social History Social History   Tobacco Use  . Smoking status: Current Every Day Smoker  . Smokeless tobacco: Never Used  Substance Use Topics  . Alcohol use: No  . Drug use: No     Allergies   Amoxicillin-pot clavulanate; Imitrex [sumatriptan base]; and  Cephalexin   Review of Systems Review of Systems  Constitutional: Negative for appetite change, chills and fever.  HENT: Negative for ear pain, rhinorrhea, sneezing and sore throat.   Eyes: Negative for photophobia and visual disturbance.  Respiratory: Negative for cough, chest tightness, shortness of breath and wheezing.   Cardiovascular: Negative for chest pain and palpitations.  Gastrointestinal: Positive for abdominal pain. Negative for blood in stool, constipation, diarrhea, nausea and vomiting.  Genitourinary: Positive for dysuria and frequency. Negative for decreased urine volume, hematuria and urgency.  Musculoskeletal: Negative for myalgias.  Skin: Negative for rash.  Neurological: Negative for dizziness, weakness and light-headedness.     Physical Exam Updated Vital Signs BP 127/70 (BP Location: Right Arm)   Pulse (!) 54   Temp 97.9 F (36.6 C) (Oral)   Resp 18   SpO2 99%   Physical Exam  Constitutional: She appears well-developed and well-nourished. No distress.  Nontoxic appearing and in no acute distress.  HENT:  Head: Normocephalic and atraumatic.  Nose: Nose normal.  Eyes: Conjunctivae and EOM are normal. Right eye exhibits no discharge. Left eye exhibits no discharge. No scleral icterus.  Neck: Normal range of motion. Neck supple.  Cardiovascular: Normal rate, regular rhythm, normal heart sounds and intact distal pulses. Exam reveals no gallop and no friction rub.  No murmur heard. Pulmonary/Chest: Effort normal and breath sounds normal. No respiratory distress.  Abdominal: Soft.  Bowel sounds are normal. She exhibits no distension. There is no tenderness. There is no rebound and no guarding.  No CVA tenderness bilaterally.  Musculoskeletal: Normal range of motion. She exhibits no edema.  Neurological: She is alert. She exhibits normal muscle tone. Coordination normal.  Skin: Skin is warm and dry. No rash noted.  Psychiatric: She has a normal mood and affect.   Nursing note and vitals reviewed.    ED Treatments / Results  Labs (all labs ordered are listed, but only abnormal results are displayed) Labs Reviewed  URINALYSIS, ROUTINE W REFLEX MICROSCOPIC - Abnormal; Notable for the following components:      Result Value   APPearance HAZY (*)    Hgb urine dipstick SMALL (*)    Leukocytes, UA MODERATE (*)    Squamous Epithelial / LPF 0-5 (*)    All other components within normal limits  BASIC METABOLIC PANEL - Abnormal; Notable for the following components:   Glucose, Bld 132 (*)    All other components within normal limits  CBC - Abnormal; Notable for the following components:   Hemoglobin 11.8 (*)    All other components within normal limits  URINE CULTURE  I-STAT BETA HCG BLOOD, ED ([facility], [facility], [facility] ONLY)    EKG None  Radiology Ct Renal Stone Study  Result Date: 11/13/2017 CLINICAL DATA:  Hematuria. EXAM: CT ABDOMEN AND PELVIS WITHOUT CONTRAST TECHNIQUE: Multidetector CT imaging of the abdomen and pelvis was performed following the standard protocol without IV contrast. COMPARISON:  None. FINDINGS: Lower chest: No acute abnormality. Hepatobiliary: Hepatic steatosis. No focal liver abnormality. Prior cholecystectomy. No biliary dilatation. Pancreas: Unremarkable. No pancreatic ductal dilatation or surrounding inflammatory changes. Spleen: Normal in size without focal abnormality. Adrenals/Urinary Tract: Adrenal glands are unremarkable. Kidneys are normal, without renal calculi, focal lesion, or hydronephrosis. Bladder is under distended. Stomach/Bowel: Stomach is within normal limits. Appendix appears normal. No evidence of bowel wall thickening, distention, or inflammatory changes. Vascular/Lymphatic: Prominently enlarged bilateral inguinal lymph nodes measuring up to 2.1 cm in short axis. No additional lymphadenopathy. No significant vascular findings. Reproductive: Atrophy of the lower uterine segment and proximal vagina is likely related to  prior radiation. No adnexal mass. Other: Tiny fat containing umbilical hernia. No free fluid or pneumoperitoneum. Musculoskeletal: No acute or significant osseous findings. IMPRESSION: 1. No explanation for the patient's hematuria. 2. Enlarged bilateral inguinal lymph nodes measuring up to 2.1 cm in short axis. Recommend tissue sampling for further evaluation. 3. Hepatic steatosis. Electronically Signed   By: Titus Dubin M.D.   On: 11/13/2017 13:33    Procedures Procedures (including critical care time)  Medications Ordered in ED Medications - No data to display   Initial Impression / Assessment and Plan / ED Course  I have reviewed the triage vital signs and the nursing notes.  Pertinent labs & imaging results that were available during my care of the patient were reviewed by me and considered in my medical decision making (see chart for details).     Patient presents to ED for evaluation of lower abdominal discomfort, urinary frequency and dysuria for the past 2 days.  She does have a history of UTIs and states that this feels similar.  Denies any vaginal discharge, fevers, back pain.  Abdomen soft, nontender.  She has no CVA tenderness bilaterally.  She is afebrile.  Lab work including CBC, BMP, hCG unremarkable.  Urinalysis with leukocytes and WBCs.  It did show TNTC RBCs which is not present in her prior  UTIs.  She denies being on her menstrual cycle or history of kidney stones.  CT renal stone study with no renal etiology but did show bilateral inguinal lymph nodes recommend outpatient follow-up for.  Patient was informed of these findings.  Will begin Macrobid for UTI exam urine for culture.  Advised to return for any severe worsening symptoms.  Portions of this note were generated with [employer]. Dictation errors may occur despite best attempts at proofreading.   Final Clinical Impressions(s) / ED Diagnoses   Final diagnoses:  Lower urinary tract infectious  disease    ED Discharge Orders        Ordered    nitrofurantoin, macrocrystal-monohydrate, (MACROBID) 100 MG capsule  2 times daily     11/13/17 1357       Delia Heady, PA-C 11/13/17 1400    Lajean Saver, MD 11/14/17 1251

## 2017-11-13 NOTE — ED Notes (Signed)
Patient transported to CT 

## 2017-11-13 NOTE — ED Triage Notes (Signed)
Pt states she has been having pain when urinating for a few days and feels like "I have to pee constantly." Pt feels like she has a UTI

## 2017-11-13 NOTE — ED Notes (Signed)
Urine culture sent to lab with urine sample.  

## 2017-11-15 LAB — URINE CULTURE: Culture: 50000 — AB

## 2017-11-16 ENCOUNTER — Telehealth: Payer: Self-pay | Admitting: *Deleted

## 2017-11-16 MED FILL — NITROFURANTOIN MONO-MCR 100: 100 | 5 days supply | Qty: 10 | Fill #0

## 2017-11-16 NOTE — Telephone Encounter (Signed)
Post ED Visit - Positive Culture Follow-up  Culture report reviewed by antimicrobial stewardship pharmacist:  []  Elenor Quinones, Pharm.D. []  Heide Guile, Pharm.D., BCPS AQ-ID []  Parks Neptune, Pharm.D., BCPS []  Alycia Rossetti, Pharm.D., BCPS []  [place], Pharm.D., BCPS, AAHIVP [x]  Legrand Como, Pharm.D., BCPS, AAHIVP []  Salome Arnt, PharmD, BCPS []  Jalene Mullet, PharmD []  Vincenza Hews, PharmD, BCPS  Positive urine culture Treated with Nitrofurantoin Monohyd Macro, organism sensitive to the same and no further patient follow-up is required at this time.  Harlon Flor [facility] 11/16/2017, 9:38 AM

## 2018-06-06 ENCOUNTER — Emergency Department (HOSPITAL_COMMUNITY)
Admission: EM | Admit: 2018-06-06 | Discharge: 2018-06-06 | Disposition: A | Discharge status: Home / Self Care | Payer: No Typology Code available for payment source | Attending: Emergency Medicine | Admitting: Emergency Medicine

## 2018-06-06 ENCOUNTER — Other Ambulatory Visit: Payer: Self-pay

## 2018-06-06 ENCOUNTER — Encounter (HOSPITAL_COMMUNITY): Payer: Self-pay | Admitting: *Deleted

## 2018-06-06 DIAGNOSIS — N3090 Cystitis, unspecified without hematuria: Secondary | ICD-10-CM | POA: Insufficient documentation

## 2018-06-06 DIAGNOSIS — Z79899 Other long term (current) drug therapy: Secondary | ICD-10-CM | POA: Insufficient documentation

## 2018-06-06 DIAGNOSIS — F17200 Nicotine dependence, unspecified, uncomplicated: Secondary | ICD-10-CM | POA: Insufficient documentation

## 2018-06-06 LAB — URINALYSIS, ROUTINE W REFLEX MICROSCOPIC
Bilirubin Urine: NEGATIVE
GLUCOSE, UA: NEGATIVE mg/dL
Ketones, ur: NEGATIVE mg/dL
NITRITE: NEGATIVE
PROTEIN: NEGATIVE mg/dL
Specific Gravity, Urine: 1.02 (ref 1.005–1.030)
WBC, UA: >50 WBC/hpf — ABNORMAL HIGH (ref 0–5)
pH: 5 (ref 5.0–8.0)

## 2018-06-06 LAB — POC URINE PREG, ED: Preg Test, Ur: NEGATIVE

## 2018-06-06 MED ORDER — CIPROFLOXACIN HCL 500 MG PO TABS: 500.0000 mg | ORAL_TABLET | Freq: Two times a day (BID) | ORAL | 0 refills | Status: DC

## 2018-06-06 MED FILL — CIPROFLOXACIN HCL 500 MG TA: 500 | 7 days supply | Qty: 14 | Fill #0

## 2018-06-06 NOTE — ED Notes (Signed)
Pt stable, ambulatory, states understanding of discharge instructions 

## 2018-06-06 NOTE — ED Provider Notes (Signed)
[facility]PART[facility] Provider Note   CSN: [id number] Arrival date & time: 06/06/18  1352     History   Chief Complaint Chief Complaint  Patient presents with  . Recurrent UTI    HPI Marie Brewer is a 37 y.o. female.  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  A few days ago she began having burning with urination that became more severe.  This morning she woke up and noted a fever of 101.9.  She took Tylenol and the fever went down but then it came back up again and her boss sent her home from work.  She denies any associated abdominal pain, nausea, vomiting, diarrhea, back pain, or cough.  She has had mild sinus congestion.  She had some mild vaginal spotting tonight that resolved today and no associated vaginal discharge.  Symptoms are similar to previous episode of UTI in the past.  The history is provided by the patient.    Past Medical History:  Diagnosis Date  . History of cervical cancer   . Migraine headache     Patient Active Problem List   Diagnosis Date Noted  . Cervical ca ([place]) 07/30/2013    Past Surgical History:  Procedure Laterality Date  . MANDIBLE FRACTURE SURGERY       OB History   None      Home Medications    Prior to Admission medications   Medication Sig Start Date End Date Taking? Authorizing Provider  ciprofloxacin (CIPRO) 500 MG tablet Take 1 tablet (500 mg total) by mouth every 12 (twelve) hours. 06/06/18   Deanne Bedgood, Wenda Overland, MD  ibuprofen (ADVIL,MOTRIN) 200 MG tablet Take 400 mg by mouth every 6 (six) hours as needed. For pain    [provider]  METHADONE HCL PO Take 130 mg by mouth daily.    [provider]  nitrofurantoin, macrocrystal-monohydrate, (MACROBID) 100 MG capsule Take 1 capsule (100 mg total) by mouth 2 (two) times daily. 11/13/17    Khatri, Hina, PA-C  silver sulfADIAZINE (SILVADENE) 1 % cream Apply 1 application topically daily. Thin layer after washing tid 06/11/15   Billy Fischer, MD    Family History History reviewed. No pertinent family history.  Social History Social History   Tobacco Use  . Smoking status: Current Every Day Smoker  . Smokeless tobacco: Never Used  Substance Use Topics  . Alcohol use: No  . Drug use: No     Allergies   Amoxicillin-pot clavulanate; Imitrex [sumatriptan base]; and Cephalexin   Review of Systems Review of Systems All other systems reviewed and are negative except that which was mentioned in HPI   Physical Exam Updated Vital Signs BP (!) 120/98 (BP Location: Right Arm)   Pulse (!) 101   Temp 98.8 F (37.1 C) (Oral)   Ht 5\' 6"  (1.676 m)   Wt 112.5 kg   SpO2 97%   BMI 40.03 kg/m   Physical Exam  Constitutional: She is oriented to person, place, and time. She appears well-developed and well-nourished. No distress.  HENT:  Head: Normocephalic and atraumatic.  Moist mucous membranes  Eyes: Conjunctivae are normal.  Neck: Neck supple.  Cardiovascular: Normal rate, regular rhythm and normal heart sounds.  No murmur heard. Pulmonary/Chest: Effort normal and breath sounds normal.  Abdominal: Soft. Bowel sounds are normal. She exhibits no distension. There is no tenderness.  Musculoskeletal:  She exhibits no edema.  Neurological: She is alert and oriented to person, place, and time.  Fluent speech  Skin: Skin is warm and dry.  Psychiatric: She has a normal mood and affect. Judgment normal.  Nursing note and vitals reviewed.    ED Treatments / Results  Labs (all labs ordered are listed, but only abnormal results are displayed) Labs Reviewed  URINALYSIS, ROUTINE W REFLEX MICROSCOPIC - Abnormal; Notable for the following components:      Result Value   APPearance HAZY (*)    Hgb urine dipstick MODERATE (*)    Leukocytes, UA MODERATE (*)    RBC / HPF >50  (*)    WBC, UA >50 (*)    Bacteria, UA FEW (*)    All other components within normal limits  URINE CULTURE  POC URINE PREG, ED    EKG None  Radiology No results found.  Procedures Procedures (including critical care time)  Medications Ordered in ED Medications - No data to display   Initial Impression / Assessment and Plan / ED Course  I have reviewed the triage vital signs and the nursing notes.  Pertinent labs that were available during my care of the patient were reviewed by me and considered in my medical decision making (see chart for details).     Well appearing, normal VS. No abd tenderness. No back pain or vomiting to suggest pyelonephritis or kidney stone. UA indicates infection. Culture sent.  Patient states she had stomach upset with previous prescription which was Macrobid.  Because of her allergies to penicillins and cephalosporins, provided with ciprofloxacin.  Cautioned on side effect of tendon rupture.  Discussed supportive measures including Pyridium as needed.  Extensively reviewed return precautions regarding worsening symptoms.  She voiced understanding.  Final Clinical Impressions(s) / ED Diagnoses   Final diagnoses:  Cystitis    ED Discharge Orders         Ordered    ciprofloxacin (CIPRO) 500 MG tablet  Every 12 hours     06/06/18 1603           Curtina Grills, Wenda Overland, MD 06/06/18 1607

## 2018-06-06 NOTE — ED Triage Notes (Signed)
Pt reports a possible UTI . Pt reports burning and spotting on tissue. Pt has also had an fever off and on.

## 2018-06-07 LAB — URINE CULTURE: SPECIAL REQUESTS: NORMAL

## 2020-05-04 ENCOUNTER — Emergency Department (HOSPITAL_COMMUNITY): Payer: Self-pay

## 2020-05-04 ENCOUNTER — Encounter (HOSPITAL_COMMUNITY): Payer: Self-pay | Admitting: Emergency Medicine

## 2020-05-04 ENCOUNTER — Other Ambulatory Visit: Payer: Self-pay

## 2020-05-04 ENCOUNTER — Inpatient Hospital Stay (HOSPITAL_COMMUNITY)
Admission: EM | Admit: 2020-05-04 | Discharge: 2020-05-07 | DRG: 872 | Discharge status: Against Medical Advice | Payer: Self-pay | Attending: Internal Medicine | Admitting: Internal Medicine

## 2020-05-04 DIAGNOSIS — Z8541 Personal history of malignant neoplasm of cervix uteri: Secondary | ICD-10-CM

## 2020-05-04 DIAGNOSIS — E871 Hypo-osmolality and hyponatremia: Secondary | ICD-10-CM | POA: Diagnosis present

## 2020-05-04 DIAGNOSIS — Z20822 Contact with and (suspected) exposure to covid-19: Secondary | ICD-10-CM | POA: Diagnosis present

## 2020-05-04 DIAGNOSIS — F17200 Nicotine dependence, unspecified, uncomplicated: Secondary | ICD-10-CM | POA: Diagnosis present

## 2020-05-04 DIAGNOSIS — Z79891 Long term (current) use of opiate analgesic: Secondary | ICD-10-CM

## 2020-05-04 DIAGNOSIS — L03115 Cellulitis of right lower limb: Secondary | ICD-10-CM | POA: Diagnosis present

## 2020-05-04 DIAGNOSIS — E876 Hypokalemia: Secondary | ICD-10-CM | POA: Diagnosis present

## 2020-05-04 DIAGNOSIS — Z6841 Body Mass Index (BMI) 40.0 and over, adult: Secondary | ICD-10-CM

## 2020-05-04 DIAGNOSIS — L03116 Cellulitis of left lower limb: Secondary | ICD-10-CM | POA: Diagnosis present

## 2020-05-04 DIAGNOSIS — L02419 Cutaneous abscess of limb, unspecified: Principal | ICD-10-CM

## 2020-05-04 DIAGNOSIS — M009 Pyogenic arthritis, unspecified: Secondary | ICD-10-CM | POA: Diagnosis present

## 2020-05-04 DIAGNOSIS — F1910 Other psychoactive substance abuse, uncomplicated: Secondary | ICD-10-CM | POA: Diagnosis present

## 2020-05-04 DIAGNOSIS — Z881 Allergy status to other antibiotic agents status: Secondary | ICD-10-CM

## 2020-05-04 DIAGNOSIS — L03114 Cellulitis of left upper limb: Secondary | ICD-10-CM | POA: Diagnosis present

## 2020-05-04 DIAGNOSIS — L03119 Cellulitis of unspecified part of limb: Secondary | ICD-10-CM

## 2020-05-04 DIAGNOSIS — A419 Sepsis, unspecified organism: Principal | ICD-10-CM | POA: Diagnosis present

## 2020-05-04 DIAGNOSIS — Z880 Allergy status to penicillin: Secondary | ICD-10-CM

## 2020-05-04 DIAGNOSIS — Z923 Personal history of irradiation: Secondary | ICD-10-CM

## 2020-05-04 DIAGNOSIS — Z79899 Other long term (current) drug therapy: Secondary | ICD-10-CM

## 2020-05-04 DIAGNOSIS — L0390 Cellulitis, unspecified: Secondary | ICD-10-CM | POA: Diagnosis present

## 2020-05-04 DIAGNOSIS — L02414 Cutaneous abscess of left upper limb: Secondary | ICD-10-CM | POA: Diagnosis present

## 2020-05-04 DIAGNOSIS — Z792 Long term (current) use of antibiotics: Secondary | ICD-10-CM

## 2020-05-04 DIAGNOSIS — Z9221 Personal history of antineoplastic chemotherapy: Secondary | ICD-10-CM

## 2020-05-04 DIAGNOSIS — Z888 Allergy status to other drugs, medicaments and biological substances status: Secondary | ICD-10-CM

## 2020-05-04 MED ORDER — OXYCODONE-ACETAMINOPHEN 5-325 MG PO TABS
1.0000 | ORAL_TABLET | Freq: Once | ORAL | Status: AC
  Administered 2020-05-04: 1 via ORAL
  Filled 2020-05-04: qty 1

## 2020-05-04 NOTE — ED Triage Notes (Signed)
Patient states she fell a few days ago and has had RL pain so she has been supporting herself with her right wrist. Patient states today she put her weight on her hand and felt a pop in her wrist. Bruising and swelling noted, no deformity. Patient extremely tearful, unable to palpate patient's wrist due to pain.
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
## 2020-05-05 ENCOUNTER — Observation Stay (HOSPITAL_COMMUNITY): Payer: Self-pay | Admitting: Certified Registered"

## 2020-05-05 ENCOUNTER — Encounter (HOSPITAL_COMMUNITY): Payer: Self-pay | Admitting: Family Medicine

## 2020-05-05 ENCOUNTER — Encounter (HOSPITAL_COMMUNITY)
Admission: EM | Discharge status: Against Medical Advice | Payer: Self-pay | Source: Home / Self Care | Attending: Internal Medicine

## 2020-05-05 DIAGNOSIS — F1910 Other psychoactive substance abuse, uncomplicated: Secondary | ICD-10-CM

## 2020-05-05 DIAGNOSIS — E871 Hypo-osmolality and hyponatremia: Secondary | ICD-10-CM

## 2020-05-05 DIAGNOSIS — L0390 Cellulitis, unspecified: Secondary | ICD-10-CM

## 2020-05-05 DIAGNOSIS — A419 Sepsis, unspecified organism: Principal | ICD-10-CM

## 2020-05-05 DIAGNOSIS — L02414 Cutaneous abscess of left upper limb: Secondary | ICD-10-CM

## 2020-05-05 DIAGNOSIS — E876 Hypokalemia: Secondary | ICD-10-CM

## 2020-05-05 HISTORY — PX: IRRIGATION AND DEBRIDEMENT ABSCESS: SHX5252

## 2020-05-05 HISTORY — DX: Other psychoactive substance abuse, uncomplicated: F19.10

## 2020-05-05 LAB — CBC WITH DIFFERENTIAL/PLATELET
Abs Immature Granulocytes: 0.21 10*3/uL — ABNORMAL HIGH (ref 0.00–0.07)
Basophils Absolute: 0 10*3/uL (ref 0.0–0.1)
Basophils Relative: 0 %
Eosinophils Absolute: 0 10*3/uL (ref 0.0–0.5)
Eosinophils Relative: 0 %
HCT: 31.9 % — ABNORMAL LOW (ref 36.0–46.0)
Hemoglobin: 10.5 g/dL — ABNORMAL LOW (ref 12.0–15.0)
Immature Granulocytes: 1 %
Lymphocytes Relative: 4 %
Lymphs Abs: 0.7 10*3/uL (ref 0.7–4.0)
MCH: 28.1 pg (ref 26.0–34.0)
MCHC: 32.9 g/dL (ref 30.0–36.0)
MCV: 85.3 fL (ref 80.0–100.0)
Monocytes Absolute: 0.8 10*3/uL (ref 0.1–1.0)
Monocytes Relative: 5 %
Neutro Abs: 14.7 10*3/uL — ABNORMAL HIGH (ref 1.7–7.7)
Neutrophils Relative %: 90 %
Platelets: 262 10*3/uL (ref 150–400)
RBC: 3.74 MIL/uL — ABNORMAL LOW (ref 3.87–5.11)
RDW: 14.2 % (ref 11.5–15.5)
WBC: 16.4 10*3/uL — ABNORMAL HIGH (ref 4.0–10.5)
nRBC: 0 % (ref 0.0–0.2)

## 2020-05-05 LAB — BASIC METABOLIC PANEL
Anion gap: 10 (ref 5–15)
Anion gap: 12 (ref 5–15)
BUN: 11 mg/dL (ref 6–20)
BUN: 9 mg/dL (ref 6–20)
CO2: 23 mmol/L (ref 22–32)
CO2: 25 mmol/L (ref 22–32)
Calcium: 8.3 mg/dL — ABNORMAL LOW (ref 8.9–10.3)
Calcium: 8.5 mg/dL — ABNORMAL LOW (ref 8.9–10.3)
Chloride: 96 mmol/L — ABNORMAL LOW (ref 98–111)
Chloride: 98 mmol/L (ref 98–111)
Creatinine, Ser: 0.72 mg/dL (ref 0.44–1.00)
Creatinine, Ser: 0.75 mg/dL (ref 0.44–1.00)
GFR calc non Af Amer: >60 mL/min (ref >60)
GFR calc non Af Amer: >60 mL/min (ref >60)
Glucose, Bld: 104 mg/dL — ABNORMAL HIGH (ref 70–99)
Glucose, Bld: 97 mg/dL (ref 70–99)
Potassium: 3.1 mmol/L — ABNORMAL LOW (ref 3.5–5.1)
Potassium: 3.3 mmol/L — ABNORMAL LOW (ref 3.5–5.1)
Sodium: 131 mmol/L — ABNORMAL LOW (ref 135–145)
Sodium: 133 mmol/L — ABNORMAL LOW (ref 135–145)

## 2020-05-05 LAB — C-REACTIVE PROTEIN: CRP: 23.1 mg/dL — ABNORMAL HIGH (ref <1.0)

## 2020-05-05 LAB — RAPID URINE DRUG SCREEN, HOSP PERFORMED
Amphetamines: POSITIVE — AB
Barbiturates: NOT DETECTED
Benzodiazepines: NOT DETECTED
Cocaine: NOT DETECTED
Opiates: POSITIVE — AB
Tetrahydrocannabinol: NOT DETECTED

## 2020-05-05 LAB — LACTIC ACID, PLASMA: Lactic Acid, Venous: 1.3 mmol/L (ref 0.5–1.9)

## 2020-05-05 LAB — HIV ANTIBODY (ROUTINE TESTING W REFLEX): HIV Screen 4th Generation wRfx: NONREACTIVE

## 2020-05-05 LAB — CBC
HCT: 29.2 % — ABNORMAL LOW (ref 36.0–46.0)
Hemoglobin: 9.7 g/dL — ABNORMAL LOW (ref 12.0–15.0)
MCH: 27.8 pg (ref 26.0–34.0)
MCHC: 33.2 g/dL (ref 30.0–36.0)
MCV: 83.7 fL (ref 80.0–100.0)
Platelets: 235 10*3/uL (ref 150–400)
RBC: 3.49 MIL/uL — ABNORMAL LOW (ref 3.87–5.11)
RDW: 14.3 % (ref 11.5–15.5)
WBC: 21.4 10*3/uL — ABNORMAL HIGH (ref 4.0–10.5)
nRBC: 0 % (ref 0.0–0.2)

## 2020-05-05 LAB — RESPIRATORY PANEL BY RT PCR (FLU A&B, COVID)
Influenza A by PCR: NEGATIVE
Influenza B by PCR: NEGATIVE
SARS Coronavirus 2 by RT PCR: NEGATIVE

## 2020-05-05 LAB — MAGNESIUM: Magnesium: 1.5 mg/dL — ABNORMAL LOW (ref 1.7–2.4)

## 2020-05-05 LAB — SEDIMENTATION RATE: Sed Rate: 96 mm/hr — ABNORMAL HIGH (ref 0–22)

## 2020-05-05 LAB — PREGNANCY, URINE: Preg Test, Ur: NEGATIVE

## 2020-05-05 SURGERY — MINOR INCISION AND DRAINAGE OF ABSCESS
Anesthesia: General | Site: Arm Lower | Laterality: Bilateral

## 2020-05-05 MED ORDER — ACETAMINOPHEN 325 MG PO TABS: 325.0000 mg | ORAL_TABLET | Freq: Once | ORAL | Status: DC | PRN

## 2020-05-05 MED ORDER — FENTANYL CITRATE (PF) 250 MCG/5ML IJ SOLN
INTRAMUSCULAR | Status: AC
  Filled 2020-05-05: qty 5

## 2020-05-05 MED ORDER — VANCOMYCIN HCL IN DEXTROSE 1-5 GM/200ML-% IV SOLN
INTRAVENOUS | Status: AC
  Filled 2020-05-05: qty 200

## 2020-05-05 MED ORDER — 0.9 % SODIUM CHLORIDE (POUR BTL) OPTIME
TOPICAL | Status: DC | PRN
  Administered 2020-05-05: 2000 mL

## 2020-05-05 MED ORDER — PHENYLEPHRINE 40 MCG/ML (10ML) SYRINGE FOR IV PUSH (FOR BLOOD PRESSURE SUPPORT)
PREFILLED_SYRINGE | INTRAVENOUS | Status: AC
  Filled 2020-05-05: qty 10

## 2020-05-05 MED ORDER — LACTATED RINGERS IV SOLN: INTRAVENOUS | Status: DC

## 2020-05-05 MED ORDER — DEXMEDETOMIDINE (PRECEDEX) IN NS 20 MCG/5ML (4 MCG/ML) IV SYRINGE
PREFILLED_SYRINGE | INTRAVENOUS | Status: DC | PRN
  Administered 2020-05-05 (×3): 8 ug via INTRAVENOUS
  Administered 2020-05-05: 4 ug via INTRAVENOUS
  Administered 2020-05-05: 8 ug via INTRAVENOUS

## 2020-05-05 MED ORDER — ONDANSETRON HCL 4 MG/2ML IJ SOLN
INTRAMUSCULAR | Status: DC | PRN
  Administered 2020-05-05: 4 mg via INTRAVENOUS

## 2020-05-05 MED ORDER — LIDOCAINE 2% (20 MG/ML) 5 ML SYRINGE
INTRAMUSCULAR | Status: AC
  Filled 2020-05-05: qty 5

## 2020-05-05 MED ORDER — ACETAMINOPHEN 10 MG/ML IV SOLN: 1000.0000 mg | Freq: Once | INTRAVENOUS | Status: DC | PRN

## 2020-05-05 MED ORDER — DROPERIDOL 2.5 MG/ML IJ SOLN
2.5000 mg | Freq: Once | INTRAMUSCULAR | Status: AC
  Administered 2020-05-05: 2.5 mg via INTRAVENOUS
  Filled 2020-05-05: qty 2

## 2020-05-05 MED ORDER — VANCOMYCIN HCL 1000 MG IV SOLR
INTRAVENOUS | Status: DC | PRN
  Administered 2020-05-05: 1000 mg via INTRAVENOUS

## 2020-05-05 MED ORDER — ACETAMINOPHEN 325 MG PO TABS: 650.0000 mg | ORAL_TABLET | Freq: Four times a day (QID) | ORAL | Status: DC | PRN

## 2020-05-05 MED ORDER — FENTANYL CITRATE (PF) 100 MCG/2ML IJ SOLN
INTRAMUSCULAR | Status: AC
  Filled 2020-05-05: qty 2

## 2020-05-05 MED ORDER — MIDAZOLAM HCL 2 MG/2ML IJ SOLN
INTRAMUSCULAR | Status: AC
  Filled 2020-05-05: qty 2

## 2020-05-05 MED ORDER — MAGNESIUM OXIDE 400 (241.3 MG) MG PO TABS
400.0000 mg | ORAL_TABLET | Freq: Two times a day (BID) | ORAL | Status: AC
  Administered 2020-05-05 – 2020-05-07 (×6): 400 mg via ORAL
  Filled 2020-05-05 (×6): qty 1

## 2020-05-05 MED ORDER — VANCOMYCIN HCL IN DEXTROSE 1-5 GM/200ML-% IV SOLN
1000.0000 mg | Freq: Once | INTRAVENOUS | Status: AC
  Administered 2020-05-05: 1000 mg via INTRAVENOUS
  Filled 2020-05-05: qty 200

## 2020-05-05 MED ORDER — POLYETHYLENE GLYCOL 3350 17 G PO PACK: 17.0000 g | PACK | Freq: Every day | ORAL | Status: DC | PRN

## 2020-05-05 MED ORDER — LABETALOL HCL 5 MG/ML IV SOLN
INTRAVENOUS | Status: DC | PRN
  Administered 2020-05-05: 5 mg via INTRAVENOUS

## 2020-05-05 MED ORDER — KETOROLAC TROMETHAMINE 30 MG/ML IJ SOLN
30.0000 mg | Freq: Four times a day (QID) | INTRAMUSCULAR | Status: DC | PRN
  Administered 2020-05-05 – 2020-05-07 (×7): 30 mg via INTRAVENOUS
  Filled 2020-05-05 (×7): qty 1

## 2020-05-05 MED ORDER — MORPHINE SULFATE (PF) 4 MG/ML IV SOLN
4.0000 mg | Freq: Once | INTRAVENOUS | Status: AC
  Administered 2020-05-05: 4 mg via INTRAVENOUS
  Filled 2020-05-05: qty 1

## 2020-05-05 MED ORDER — ONDANSETRON HCL 4 MG/2ML IJ SOLN: 4.0000 mg | Freq: Four times a day (QID) | INTRAMUSCULAR | Status: DC | PRN

## 2020-05-05 MED ORDER — FENTANYL CITRATE (PF) 100 MCG/2ML IJ SOLN
INTRAMUSCULAR | Status: AC
  Administered 2020-05-05: 50 ug via INTRAVENOUS
  Filled 2020-05-05: qty 2

## 2020-05-05 MED ORDER — LORAZEPAM 2 MG/ML IJ SOLN
1.0000 mg | Freq: Once | INTRAMUSCULAR | Status: AC
  Administered 2020-05-05: 1 mg via INTRAVENOUS
  Filled 2020-05-05: qty 1

## 2020-05-05 MED ORDER — DEXMEDETOMIDINE (PRECEDEX) IN NS 20 MCG/5ML (4 MCG/ML) IV SYRINGE
PREFILLED_SYRINGE | INTRAVENOUS | Status: AC
  Filled 2020-05-05: qty 10

## 2020-05-05 MED ORDER — SODIUM CHLORIDE 0.9% FLUSH
3.0000 mL | Freq: Two times a day (BID) | INTRAVENOUS | Status: DC
  Administered 2020-05-05 – 2020-05-07 (×2): 3 mL via INTRAVENOUS

## 2020-05-05 MED ORDER — NICOTINE 21 MG/24HR TD PT24
21.0000 mg | MEDICATED_PATCH | Freq: Once | TRANSDERMAL | Status: AC
  Administered 2020-05-05: 21 mg via TRANSDERMAL
  Filled 2020-05-05: qty 1

## 2020-05-05 MED ORDER — PROPOFOL 10 MG/ML IV BOLUS
INTRAVENOUS | Status: AC
  Filled 2020-05-05: qty 20

## 2020-05-05 MED ORDER — HYDROMORPHONE HCL 1 MG/ML IJ SOLN
0.5000 mg | Freq: Once | INTRAMUSCULAR | Status: AC
  Administered 2020-05-05: 0.5 mg via INTRAVENOUS
  Filled 2020-05-05: qty 1

## 2020-05-05 MED ORDER — FENTANYL CITRATE (PF) 100 MCG/2ML IJ SOLN
25.0000 ug | INTRAMUSCULAR | Status: DC | PRN
  Administered 2020-05-05: 50 ug via INTRAVENOUS

## 2020-05-05 MED ORDER — POTASSIUM CHLORIDE CRYS ER 20 MEQ PO TBCR
40.0000 meq | EXTENDED_RELEASE_TABLET | Freq: Once | ORAL | Status: AC
  Administered 2020-05-05: 40 meq via ORAL
  Filled 2020-05-05: qty 2

## 2020-05-05 MED ORDER — MEPERIDINE HCL 50 MG/ML IJ SOLN: 6.2500 mg | INTRAMUSCULAR | Status: DC | PRN

## 2020-05-05 MED ORDER — SUCCINYLCHOLINE CHLORIDE 200 MG/10ML IV SOSY
PREFILLED_SYRINGE | INTRAVENOUS | Status: AC
  Filled 2020-05-05: qty 10

## 2020-05-05 MED ORDER — ACETAMINOPHEN 160 MG/5ML PO SOLN: 325.0000 mg | Freq: Once | ORAL | Status: DC | PRN

## 2020-05-05 MED ORDER — PROPOFOL 10 MG/ML IV BOLUS
INTRAVENOUS | Status: DC | PRN
  Administered 2020-05-05: 200 mg via INTRAVENOUS

## 2020-05-05 MED ORDER — VANCOMYCIN HCL 2000 MG/400ML IV SOLN
2000.0000 mg | Freq: Once | INTRAVENOUS | Status: AC
  Administered 2020-05-05: 2000 mg via INTRAVENOUS
  Filled 2020-05-05: qty 400

## 2020-05-05 MED ORDER — ONDANSETRON HCL 4 MG/2ML IJ SOLN
4.0000 mg | Freq: Once | INTRAMUSCULAR | Status: AC
  Administered 2020-05-05: 4 mg via INTRAVENOUS
  Filled 2020-05-05: qty 2

## 2020-05-05 MED ORDER — LIDOCAINE HCL (CARDIAC) PF 50 MG/5ML IV SOSY
PREFILLED_SYRINGE | INTRAVENOUS | Status: DC | PRN
  Administered 2020-05-05: 100 mg via INTRAVENOUS

## 2020-05-05 MED ORDER — POTASSIUM CHLORIDE CRYS ER 20 MEQ PO TBCR: 40.0000 meq | EXTENDED_RELEASE_TABLET | Freq: Once | ORAL | Status: DC

## 2020-05-05 MED ORDER — POTASSIUM CHLORIDE CRYS ER 20 MEQ PO TBCR
20.0000 meq | EXTENDED_RELEASE_TABLET | Freq: Once | ORAL | Status: AC
  Administered 2020-05-05: 20 meq via ORAL
  Filled 2020-05-05: qty 1

## 2020-05-05 MED ORDER — VANCOMYCIN HCL 1500 MG/300ML IV SOLN: 1500.0000 mg | Freq: Two times a day (BID) | INTRAVENOUS | Status: DC

## 2020-05-05 MED ORDER — AMISULPRIDE (ANTIEMETIC) 5 MG/2ML IV SOLN: 10.0000 mg | Freq: Once | INTRAVENOUS | Status: DC | PRN

## 2020-05-05 MED ORDER — VANCOMYCIN HCL 1500 MG/300ML IV SOLN
1500.0000 mg | Freq: Two times a day (BID) | INTRAVENOUS | Status: DC
  Administered 2020-05-06 – 2020-05-07 (×4): 1500 mg via INTRAVENOUS
  Filled 2020-05-05 (×5): qty 300

## 2020-05-05 MED ORDER — EPHEDRINE 5 MG/ML INJ
INTRAVENOUS | Status: AC
  Filled 2020-05-05: qty 10

## 2020-05-05 MED ORDER — ONDANSETRON HCL 4 MG PO TABS: 4.0000 mg | ORAL_TABLET | Freq: Four times a day (QID) | ORAL | Status: DC | PRN

## 2020-05-05 MED ORDER — LORAZEPAM 2 MG/ML IJ SOLN
1.0000 mg | INTRAMUSCULAR | Status: DC | PRN
  Administered 2020-05-05 – 2020-05-06 (×4): 1 mg via INTRAVENOUS
  Filled 2020-05-05 (×4): qty 1

## 2020-05-05 MED ORDER — BACITRACIN ZINC 500 UNIT/GM EX OINT
TOPICAL_OINTMENT | CUTANEOUS | Status: AC
  Filled 2020-05-05: qty 28.35

## 2020-05-05 MED ORDER — SODIUM CHLORIDE 0.9 % IV BOLUS
1000.0000 mL | Freq: Once | INTRAVENOUS | Status: AC
  Administered 2020-05-05: 1000 mL via INTRAVENOUS

## 2020-05-05 MED ORDER — BUPIVACAINE-EPINEPHRINE (PF) 0.25% -1:200000 IJ SOLN
INTRAMUSCULAR | Status: AC
  Filled 2020-05-05: qty 30

## 2020-05-05 MED ORDER — LIDOCAINE-EPINEPHRINE 1 %-1:100000 IJ SOLN
INTRAMUSCULAR | Status: AC
  Filled 2020-05-05: qty 1

## 2020-05-05 MED ORDER — ACETAMINOPHEN 650 MG RE SUPP: 650.0000 mg | Freq: Four times a day (QID) | RECTAL | Status: DC | PRN

## 2020-05-05 MED ORDER — OXYCODONE HCL 5 MG PO TABS
10.0000 mg | ORAL_TABLET | ORAL | Status: DC | PRN
  Administered 2020-05-05 – 2020-05-07 (×8): 10 mg via ORAL
  Filled 2020-05-05 (×8): qty 2

## 2020-05-05 MED ORDER — POTASSIUM CHLORIDE IN NACL 20-0.9 MEQ/L-% IV SOLN
INTRAVENOUS | Status: AC
  Filled 2020-05-05: qty 1000

## 2020-05-05 MED ORDER — MAGNESIUM SULFATE 2 GM/50ML IV SOLN
2.0000 g | Freq: Once | INTRAVENOUS | Status: AC
  Administered 2020-05-05: 2 g via INTRAVENOUS
  Filled 2020-05-05: qty 50

## 2020-05-05 MED ORDER — FENTANYL CITRATE (PF) 100 MCG/2ML IJ SOLN
INTRAMUSCULAR | Status: DC | PRN
  Administered 2020-05-05: 50 ug via INTRAVENOUS
  Administered 2020-05-05: 100 ug via INTRAVENOUS
  Administered 2020-05-05 (×4): 50 ug via INTRAVENOUS
  Administered 2020-05-05 (×2): 100 ug via INTRAVENOUS
  Administered 2020-05-05: 50 ug via INTRAVENOUS

## 2020-05-05 SURGICAL SUPPLY — 73 items
ADH SKN CLS APL DERMABOND .7 (GAUZE/BANDAGES/DRESSINGS)
APL SKNCLS STERI-STRIP NONHPOA (GAUZE/BANDAGES/DRESSINGS)
BAG DECANTER FOR FLEXI CONT (MISCELLANEOUS) IMPLANT
BALL CTTN LRG ABS STRL LF (GAUZE/BANDAGES/DRESSINGS) ×2
BENZOIN TINCTURE PRP APPL 2/3 (GAUZE/BANDAGES/DRESSINGS) IMPLANT
BLADE CLIPPER SURG (BLADE) IMPLANT
BLADE DERMATOME SS (BLADE) IMPLANT
BLADE HEX COATED 2.75 (ELECTRODE) IMPLANT
BLADE SURG 15 STRL LF DISP TIS (BLADE) ×1 IMPLANT
BLADE SURG 15 STRL SS (BLADE) ×3
BLADE SURG SZ10 CARB STEEL (BLADE) IMPLANT
BNDG COHESIVE 4X5 TAN STRL (GAUZE/BANDAGES/DRESSINGS) IMPLANT
BNDG ELASTIC 3X5.8 VLCR STR LF (GAUZE/BANDAGES/DRESSINGS) IMPLANT
BNDG ELASTIC 4X5.8 VLCR STR LF (GAUZE/BANDAGES/DRESSINGS) ×3 IMPLANT
BNDG ELASTIC 6X5.8 VLCR STR LF (GAUZE/BANDAGES/DRESSINGS) IMPLANT
BNDG GAUZE ELAST 4 BULKY (GAUZE/BANDAGES/DRESSINGS) ×3 IMPLANT
CLOSURE WOUND 1/2 X4 (GAUZE/BANDAGES/DRESSINGS)
COTTONBALL LRG STERILE PKG (GAUZE/BANDAGES/DRESSINGS) ×6 IMPLANT
COVER WAND RF STERILE (DRAPES) IMPLANT
DECANTER SPIKE VIAL GLASS SM (MISCELLANEOUS) IMPLANT
DERMABOND ADVANCED (GAUZE/BANDAGES/DRESSINGS)
DERMABOND ADVANCED .7 DNX12 (GAUZE/BANDAGES/DRESSINGS) IMPLANT
DERMACARRIERS GRAFT 1 TO 1.5 (DISPOSABLE)
DRAPE EXTREMITY T 121X128X90 (DISPOSABLE) IMPLANT
DRAPE IMP U-DRAPE 54X76 (DRAPES) IMPLANT
DRAPE INCISE IOBAN 66X45 STRL (DRAPES) IMPLANT
DRAPE LAPAROTOMY T 98X78 PEDS (DRAPES) IMPLANT
DRAPE SHEET LG 3/4 BI-LAMINATE (DRAPES) IMPLANT
DRAPE SURG 17X23 STRL (DRAPES) ×12 IMPLANT
DRSG ADAPTIC 3X8 NADH LF (GAUZE/BANDAGES/DRESSINGS) IMPLANT
DRSG EMULSION OIL 3X3 NADH (GAUZE/BANDAGES/DRESSINGS) ×3 IMPLANT
DRSG PAD ABDOMINAL 8X10 ST (GAUZE/BANDAGES/DRESSINGS) IMPLANT
ELECT REM PT RETURN 15FT ADLT (MISCELLANEOUS) IMPLANT
GAUZE 4X4 16PLY RFD (DISPOSABLE) IMPLANT
GAUZE PACKING IODOFORM 1/4X15 (PACKING) ×3 IMPLANT
GAUZE SPONGE 4X4 12PLY STRL (GAUZE/BANDAGES/DRESSINGS) ×3 IMPLANT
GOWN STRL REUS W/ TWL LRG LVL3 (GOWN DISPOSABLE) ×1 IMPLANT
GOWN STRL REUS W/TWL LRG LVL3 (GOWN DISPOSABLE) ×3
GRAFT DERMACARRIERS 1 TO 1.5 (DISPOSABLE) IMPLANT
KIT BASIN OR (CUSTOM PROCEDURE TRAY) ×3 IMPLANT
NEEDLE PRECISIONGLIDE 27X1.5 (NEEDLE) ×3 IMPLANT
NS IRRIG 1000ML POUR BTL (IV SOLUTION) ×3 IMPLANT
PACK BASIC VI WITH GOWN DISP (CUSTOM PROCEDURE TRAY) ×3 IMPLANT
PAD CAST 3X4 CTTN HI CHSV (CAST SUPPLIES) IMPLANT
PAD CAST 4YDX4 CTTN HI CHSV (CAST SUPPLIES) IMPLANT
PADDING CAST ABS 4INX4YD NS (CAST SUPPLIES)
PADDING CAST ABS COTTON 4X4 ST (CAST SUPPLIES) IMPLANT
PADDING CAST COTTON 3X4 STRL (CAST SUPPLIES)
PADDING CAST COTTON 4X4 STRL (CAST SUPPLIES)
PENCIL SMOKE EVACUATOR (MISCELLANEOUS) IMPLANT
SPONGE LAP 18X18 RF (DISPOSABLE) ×3 IMPLANT
STAPLER VISISTAT 35W (STAPLE) IMPLANT
STOCKINETTE 4X48 STRL (DRAPES) IMPLANT
STOCKINETTE 6  STRL (DRAPES)
STOCKINETTE 6 STRL (DRAPES) IMPLANT
STOCKINETTE 8 INCH (MISCELLANEOUS) IMPLANT
STRIP CLOSURE SKIN 1/2X4 (GAUZE/BANDAGES/DRESSINGS) IMPLANT
SUCTION FRAZIER HANDLE 10FR (MISCELLANEOUS)
SUCTION TUBE FRAZIER 10FR DISP (MISCELLANEOUS) IMPLANT
SURGILUBE 2OZ TUBE FLIPTOP (MISCELLANEOUS) IMPLANT
SUT MON AB 5-0 PS2 18 (SUTURE) IMPLANT
SUT SILK 3 0 SH CR/8 (SUTURE) IMPLANT
SUT SILK 4 0 PS 2 (SUTURE) IMPLANT
SUT VIC AB 3-0 FS2 27 (SUTURE) IMPLANT
SUT VIC AB 4-0 PS2 18 (SUTURE) IMPLANT
SYR BULB EAR ULCER 3OZ GRN STR (SYRINGE) ×3 IMPLANT
SYR CONTROL 10ML LL (SYRINGE) ×3 IMPLANT
TOWEL OR 17X26 10 PK STRL BLUE (TOWEL DISPOSABLE) ×3 IMPLANT
TRAY PREP A LATEX SAFE STRL (SET/KITS/TRAYS/PACK) ×3 IMPLANT
TUBING CONNECTING 10 (TUBING) IMPLANT
TUBING CONNECTING 10' (TUBING)
UNDERPAD 30X36 HEAVY ABSORB (UNDERPADS AND DIAPERS) IMPLANT
YANKAUER SUCT BULB TIP NO VENT (SUCTIONS) IMPLANT

## 2020-05-05 NOTE — ED Notes (Signed)
Marie Brewer [phone] is available for transport home at discharge.

## 2020-05-05 NOTE — Transfer of Care (Signed)
Immediate Anesthesia Transfer of Care Note  Patient: Marie Brewer  Procedure(s) Performed: left forearm incision and drainage and aspiration of the right wrist joint (Bilateral Arm Lower)  Patient Location: PACU  Anesthesia Type:General  Level of Consciousness: awake, alert  and patient cooperative  Airway & Oxygen Therapy: Patient Spontanous Breathing and Patient connected to face mask oxygen  Post-op Assessment: Report given to RN, Post -op Vital signs reviewed and stable and Patient moving all extremities X 4  Post vital signs: stable  Last Vitals:  Vitals Value Taken Time  BP 144/80 05/05/20 1823  Temp    Pulse 99 05/05/20 1827  Resp 20 05/05/20 1827  SpO2 99 % 05/05/20 1827  Vitals shown include unvalidated device data.  Last Pain:  Vitals:   05/05/20 1604  TempSrc: Oral  PainSc:          Complications: No complications documented.

## 2020-05-05 NOTE — Op Note (Signed)
Operative Note   DATE OF OPERATION: 05/05/2020  SURGICAL DEPARTMENT: Plastic Surgery  PREOPERATIVE DIAGNOSES: Right wrist pain and left forearm abscess  POSTOPERATIVE DIAGNOSES:  same  PROCEDURE: 1.  Right wrist radiocarpal joint aspiration 2.  Incision and drainage of left forearm abscesses x2.  SURGEON: Talmadge Coventry, MD  ASSISTANT: Phoebe Sharps, PA The advanced practice practitioner (APP) assisted throughout the case.  The APP was essential in retraction and counter traction when needed to make the case progress smoothly.  This retraction and assistance made it possible to see the tissue plans for the procedure.  The assistance was needed for blood control, tissue re-approximation and assisted with closure of the incision site.  ANESTHESIA:  General.   COMPLICATIONS: None.   INDICATIONS FOR PROCEDURE:  The patient, Marie Brewer is a 39 y.o. female born on 1981/03/07, is here for treatment of bilateral upper extremity infections.  She is a chronic substance abuser who presents with fevers and significantly elevated white blood cell count.  She has quite a bit of right wrist pain with no known trauma.  Her exam is somewhat unreliable due to intoxication.  She also has some obvious abscess collections in the left forearm and additional in addition to several other areas of firmness or fat necrosis that are probably from previous injection. MRN: [id number]  CONSENT:  Informed consent was obtained directly from the patient. Risks, benefits and alternatives were fully discussed. Specific risks including but not limited to bleeding, infection, hematoma, seroma, scarring, pain, contracture, asymmetry, wound healing problems, and need for further surgery were all discussed. The patient did have an ample opportunity to have questions answered to satisfaction.   DESCRIPTION OF PROCEDURE:  The patient was taken to the operating room. SCDs were placed and antibiotics were given.  General  anesthesia was administered.  The patient's operative site was prepped and draped in a sterile fashion. A time out was performed and all information was confirmed to be correct.  I started by aspirating the right wrist.  I was able to palpate Lister's tubercle and found a soft spot at the 3 4 interval about a centimeter distal to that.  The radiocarpal joint was accessed with a 21-gauge needle.  I was able to draw back 1 or 2 cc of clear yellow fluid.  This was sent off for analysis.  I then turned my attention to the left arm.  There was one spot in the lateral aspect of the AC fossa and another in the volar ulnar forearm where there was clear erythema and fluctuance.  There were several other firm areas in the upper arm more proximally with a bit of erythema around them as well.  I started by incising each of these with a 1 to 2 cm incision to ensure there was no purulence tracking the proximal arm.  Hemostat was used to explore the firm areas with no return of purulence in any of the upper arm locations.  Hemostasis was obtained with cautery and a partial closure was performed followed by packing in those areas.  Distally I was able to get some purulence in the [facility] fossa and from the volar ulnar forearm by incising each of those areas with a 15 blade and breaking up the loculations with a hemostat.  The infections did not seem to track deep to the fascia and either location.  Copious saline irrigation was performed with about 2 L worth.  Meticulous hemostasis was performed and these areas were then packed with  iodoform gauze after partial closure with chromic sutures.  Patient's arm was then wrapped with 4 x 4's, Kerlix and an Ace wrap.  The patient tolerated the procedure well.  There were no complications. The patient was allowed to wake from anesthesia, extubated and taken to the recovery room in satisfactory condition.

## 2020-05-05 NOTE — Progress Notes (Signed)
Pharmacy Antibiotic Note  Marie Brewer is a 39 y.o. female admitted on 05/04/2020 with medical history significant for cervical cancer treated with chemoradiation 10 years ago, polysubstance abuse, and now presenting to the emergency department for evaluation of severe right wrist pain.  Pharmacy has been consulted for vancomycin for cellultis.  Plan: Vancomycin 2gm IV x 1 then 1500mg  IV q12h Follow renal function, cultures and clinical course     Temp (24hrs), Avg:99.8 F (37.7 C), Min:99.8 F (37.7 C), Max:99.8 F (37.7 C)  Recent Labs  Lab 05/04/20 2338 05/05/20 0143  WBC 16.4*  --   CREATININE 0.75  --   LATICACIDVEN  --  1.3    CrCl cannot be calculated (Unknown ideal weight.).    Allergies  Allergen Reactions   Amoxicillin-Pot Clavulanate Anaphylaxis   Imitrex [Sumatriptan Base] Other (See Comments)    Heart races   Cephalexin Rash     Thank you for allowing pharmacy to be a part of this patients care.  Dolly Rias RPh 05/05/2020, 4:25 AM

## 2020-05-05 NOTE — Progress Notes (Addendum)
Pt A/O X 4. Unable to complete admission history. Pt states she is sleepy. Refusing to answer admission questions and refusing SCD's(educated pt).

## 2020-05-05 NOTE — H&P (Signed)
History and Physical    Marda Breidenbach TIW:[id number] DOB: [birth year] DOA: 05/04/2020  PCP: Pcp, No   Patient coming from: Home   Chief Complaint: Right wrist pain   HPI: Marie Brewer is a 39 y.o. female with medical history significant for cervical cancer treated with chemoradiation 10 years ago, polysubstance abuse, and now presenting to the emergency department for evaluation of severe right wrist pain.  Patient reports longstanding erythema and ulcerations involving the bilateral lower legs, notes some more recent redness and swelling of the left forearm, but is primarily concerned with severe right wrist pain.  Patient reports that she put her weight on her right hand last night, heard a "pop," and has been experiencing severe pain since.  She has difficulty moving the wrist due to severe pain.  She has not noticed any drainage from the right wrist but reports occasional purulent drainage from her lower leg wounds.  She eventually acknowledges IV drug use in the ED.  History and exam are somewhat limited by the patient's agitation and poor cooperation.  ED Course: Upon arrival to the ED, patient is found to be afebrile, saturating well on room air, tachycardic to 130, and with stable blood pressure.  Radiographs of the right foot demonstrate soft tissue swelling without acute fracture or dislocation.  Radiographs of the right wrist are negative.  Chemistry panel is notable for sodium of 131 and potassium 3.4.  CBC features a normocytic anemia and leukocytosis to 16,400.  ESR is elevated to 96, CRP elevated to 23, and lactic acid is reassuringly normal.  UDS is positive for amphetamines and opiates.  Covid screening test not yet performed.  Blood cultures were collected in the ED, 1 L of saline was administered, and the patient was treated with vancomycin in addition to analgesics, Ativan, and droperidol.  Review of Systems:  All other systems reviewed and apart from HPI, are negative.  Past  Medical History:  Diagnosis Date  . History of cervical cancer   . Migraine headache     Past Surgical History:  Procedure Laterality Date  . MANDIBLE FRACTURE SURGERY      Social History:   reports that she has been smoking. She has never used smokeless tobacco. She reports that she does not drink alcohol and does not use drugs.  Allergies  Allergen Reactions  . Amoxicillin-Pot Clavulanate Anaphylaxis  . Imitrex [Sumatriptan Base] Other (See Comments)    Heart races  . Cephalexin Rash    History reviewed. No pertinent family history.   Prior to Admission medications   Medication Sig Start Date End Date Taking? Authorizing Provider  ciprofloxacin (CIPRO) 500 MG tablet Take 1 tablet (500 mg total) by mouth every 12 (twelve) hours. 06/06/18   Little, Wenda Overland, MD  ibuprofen (ADVIL,MOTRIN) 200 MG tablet Take 400 mg by mouth every 6 (six) hours as needed. For pain    [provider]  METHADONE HCL PO Take 130 mg by mouth daily.    [provider]  nitrofurantoin, macrocrystal-monohydrate, (MACROBID) 100 MG capsule Take 1 capsule (100 mg total) by mouth 2 (two) times daily. 11/13/17   Khatri, Hina, PA-C  silver sulfADIAZINE (SILVADENE) 1 % cream Apply 1 application topically daily. Thin layer after washing tid 06/11/15   Billy Fischer, MD    Physical Exam: Vitals:   05/04/20 2233 05/05/20 0100 05/05/20 0155  BP: (!) 134/105 (!) 137/108 (!) 138/115  Pulse: (!) 129  (!) 129  Resp: 20  19  Temp:  99.8 F (37.7 C)    TempSrc: Oral    SpO2: 100%  99%    Constitutional: No respiratory distress, agitated, disheveled   Eyes: PERTLA, lids and conjunctivae normal ENMT: Mucous membranes are moist. Posterior pharynx clear of any exudate or lesions.   Neck: normal, supple, no masses, no thyromegaly Respiratory: no wheezing, no crackles. No accessory muscle use.  Cardiovascular: S1 & S2 heard, regular rate and rhythm. No significant JVD. Abdomen: No distension,  no tenderness, soft. Bowel sounds active.  Musculoskeletal: no clubbing / cyanosis. No joint deformity upper and lower extremities.   Skin: Bilateral lower leg ulcerations with surrounding erythema. Left forearm erythema, edema, heat, and tenderness. Right wrist exquisitely tender. Warm, dry, well-perfused. Neurologic: No gross facial asymmetry. Sensation intact. Moving all extremities.  Psychiatric: Alert. Agitated, poorly cooperative.     Labs and Imaging on Admission: I have personally reviewed following labs and imaging studies  CBC: Recent Labs  Lab 05/04/20 2338  WBC 16.4*  NEUTROABS 14.7*  HGB 10.5*  HCT 31.9*  MCV 85.3  PLT 453   Basic Metabolic Panel: Recent Labs  Lab 05/04/20 2338  NA 131*  K 3.1*  CL 96*  CO2 25  GLUCOSE 104*  BUN 11  CREATININE 0.75  CALCIUM 8.5*   GFR: CrCl cannot be calculated (Unknown ideal weight.). Liver Function Tests: No results for input(s): AST, ALT, ALKPHOS, BILITOT, PROT, ALBUMIN in the last 168 hours. No results for input(s): LIPASE, AMYLASE in the last 168 hours. No results for input(s): AMMONIA in the last 168 hours. Coagulation Profile: No results for input(s): INR, PROTIME in the last 168 hours. Cardiac Enzymes: No results for input(s): CKTOTAL, CKMB, CKMBINDEX, TROPONINI in the last 168 hours. BNP (last 3 results) No results for input(s): PROBNP in the last 8760 hours. HbA1C: No results for input(s): HGBA1C in the last 72 hours. CBG: No results for input(s): GLUCAP in the last 168 hours. Lipid Profile: No results for input(s): CHOL, HDL, LDLCALC, TRIG, CHOLHDL, LDLDIRECT in the last 72 hours. Thyroid Function Tests: No results for input(s): TSH, T4TOTAL, FREET4, T3FREE, THYROIDAB in the last 72 hours. Anemia Panel: No results for input(s): VITAMINB12, FOLATE, FERRITIN, TIBC, IRON, RETICCTPCT in the last 72 hours. Urine analysis:    Component Value Date/Time   COLORURINE YELLOW 06/06/2018 1421   APPEARANCEUR HAZY  (A) 06/06/2018 1421   LABSPEC 1.020 06/06/2018 1421   PHURINE 5.0 06/06/2018 1421   GLUCOSEU NEGATIVE 06/06/2018 1421   HGBUR MODERATE (A) 06/06/2018 1421   BILIRUBINUR NEGATIVE 06/06/2018 1421   KETONESUR NEGATIVE 06/06/2018 1421   PROTEINUR NEGATIVE 06/06/2018 1421   UROBILINOGEN 0.2 10/09/2008 1457   NITRITE NEGATIVE 06/06/2018 1421   LEUKOCYTESUR MODERATE (A) 06/06/2018 1421   Sepsis Labs: $[email or web address]$ (procalcitonin:4,lacticidven:4) )No results found for this or any previous visit (from the past 240 hour(s)).   Radiological Exams on Admission: DG Wrist Complete Right  Result Date: 05/04/2020 CLINICAL DATA:  Pain EXAM: RIGHT WRIST - COMPLETE 3+ VIEW COMPARISON:  None. FINDINGS: There is no evidence of fracture or dislocation. There is no evidence of arthropathy or other focal bone abnormality. Soft tissues are unremarkable. IMPRESSION: Negative. Electronically Signed   By: Constance Holster M.D.   On: 05/04/2020 23:05   DG Foot Complete Right  Result Date: 05/04/2020 CLINICAL DATA:  Pain and swelling EXAM: RIGHT FOOT COMPLETE - 3+ VIEW COMPARISON:  None. FINDINGS: There is no evidence of fracture or dislocation. There are degenerative changes of the midfoot. There is a large  plantar calcaneal spur. There is soft tissue swelling about the foot. IMPRESSION: Soft tissue swelling without evidence of an acute fracture or dislocation. Electronically Signed   By: Constance Holster M.D.   On: 05/04/2020 23:06    Assessment/Plan   1. Sepsis secondary to cellulitis  - Patient with hx of recent IVDA presents with right wrist pain and is found to have cellulitis involving bilateral lower legs and left forearm with leukocytosis, tachycardia, and elevated inflammatory markers  - BP has remained stable, lactate is wnl, and tachycardia may be secondary to amphetamine intoxication rather than the infection  - Blood culture collected in ED and empiric antibiotics started  - Hand surgery consulted by  ED physician and much appreciated  - Continue vancomycin, follow culture, trend inflammatory markers, monitor clinical response, follow-up surgeon's recommendations  2. Polysubstance abuse; suspected amphetamine intoxication   - Patient is agitated in ED, in sinus tachycardia with rate 130s despite analgesia and IVF, UDS is positive for amphetamines and opiates, and acute amphetamine intoxication is suspected  - Continue supportive care with prn Ativan, encourage cessation    3. Hypokalemia  - Serum potassium is 3.1 on admission  - Replace potassium, repeat chemistry panel    4. Hyponatremia  - Serum sodium 131 in setting of hypovolemia  - Continue NS infusion, monitor     DVT prophylaxis: SCDs Code Status: Full  Family Communication: Discussed with patient  Disposition Plan:  Patient is from: Home  Anticipated d/c is to: Home  Anticipated d/c date is: Possibly as early as 05/06/20 Patient currently: Pending improvement with antibiotics, may need I&D as well  Consults called: Hand surgery consulted by ED physician  Admission status: Observation     Vianne Bulls, MD [facility]  05/05/2020, 4:01 AM

## 2020-05-05 NOTE — Brief Op Note (Signed)
05/05/2020  10:00 PM  PATIENT:  Marie Brewer  39 y.o. female  PRE-OPERATIVE DIAGNOSIS:  cellulitis and abscess of upper extremity  POST-OPERATIVE DIAGNOSIS:  cellulitis and abscess of upper extremity  PROCEDURE:  Procedure(s): left forearm incision and drainage and aspiration of the right wrist joint (Bilateral)  SURGEON:  Surgeon(s) and Role:    * Okey Zelek, Steffanie Dunn, MD - Primary  PHYSICIAN ASSISTANT: Phoebe Sharps, PA  ASSISTANTS: none   ANESTHESIA:   general  EBL:  10   BLOOD ADMINISTERED:none  DRAINS: none   LOCAL MEDICATIONS USED:  NONE  SPECIMEN:  Aspirate  DISPOSITION OF SPECIMEN:  PATHOLOGY  COUNTS:  YES  TOURNIQUET:  * No tourniquets in log *  DICTATION: .Dragon Dictation  PLAN OF CARE: Admit to inpatient   PATIENT DISPOSITION:  PACU - hemodynamically stable.   Delay start of Pharmacological VTE agent (>24hrs) due to surgical blood loss or risk of bleeding: not applicable

## 2020-05-05 NOTE — Progress Notes (Signed)
Patient admitted this am with sepsis 2/2 cellulitis rt wrist and right forearm with IVDA history Reviewed admission notes/plan and labs wbc worsening, hemodynamically stable I received message from Dr Claudia Desanctis who will take her to OR for debridement of possible abscess/forearm infection. Cont iv vancomycin.Cont pain control, ivf. Hypokalmeia Hypomagnesemia Hyponatremia Leucocytosis] IVDA/polysubstance abuse, uds + amphetamines, opiates.

## 2020-05-05 NOTE — Progress Notes (Signed)
TOC CM reviewed chart. Pt without PCP or insurance. [place] ED TOC CM [phone]

## 2020-05-05 NOTE — ED Notes (Signed)
Patient thrashing in bed complaining of pain. Attempted explaining to patient multiple times that she continues to move her wrist, move herself around in the bed and bear her full weight on it, it will continue to hurt. Attempted multiple times to get patient comfortable, but patient inconsolable and refusing to follow commands. Patient continually yelling, "I can't take this, I want to go home." During conversation, patient had multiple periods of somnolence where she had to be aroused in order to take PO medications. Patient continually holding her wrist and is restless in the bed.

## 2020-05-05 NOTE — ED Notes (Signed)
This RN walked into patients room to find her sitting at end of bed with her equipment unhooked. When asked if everything was ok, Patient stated "I am putting a flag on the window." Patient redirected and put back in bed and hooked back up to the equipment. Currently A&OX2. Patient educated on importance of vital sign equipment staying hooked up.

## 2020-05-05 NOTE — Anesthesia Procedure Notes (Signed)
Procedure Name: LMA Insertion Date/Time: 05/05/2020 5:29 PM Performed by: Lissa Morales, CRNA Pre-anesthesia Checklist: Patient identified, Emergency Drugs available, Suction available and Patient being monitored Patient Re-evaluated:Patient Re-evaluated prior to induction Oxygen Delivery Method: Circle system utilized Preoxygenation: Pre-oxygenation with 100% oxygen Induction Type: IV induction Ventilation: Mask ventilation without difficulty LMA: LMA with gastric port inserted LMA Size: 4.0 Tube type: Oral Number of attempts: 1 Airway Equipment and Method: Oral airway Placement Confirmation: positive ETCO2 Tube secured with: Tape Dental Injury: Teeth and Oropharynx as per pre-operative assessment

## 2020-05-05 NOTE — Consult Note (Signed)
Reason for Consult/CC: Right wrist pain  Marie Brewer is an 39 y.o. female.  HPI: Patient presents with right wrist pain.  She says she felt a pop.  History is fairly limited because patient is very emotionally distraught and seems a little bit disoriented.  She also may have a infection in her left forearm.  Drug screen was positive for several substances.  Allergies:  Allergies  Allergen Reactions  . Amoxicillin-Pot Clavulanate Anaphylaxis  . Imitrex [Sumatriptan Base] Other (See Comments)    Heart races  . Cephalexin Rash    Medications:  Current Facility-Administered Medications:  .  0.9 % NaCl with KCl 20 mEq/ L  infusion, , Intravenous, Continuous, Opyd, Ilene Qua, MD, Stopped at 05/05/20 0730 .  acetaminophen (TYLENOL) tablet 650 mg, 650 mg, Oral, Q6H PRN **OR** acetaminophen (TYLENOL) suppository 650 mg, 650 mg, Rectal, Q6H PRN, Opyd, Timothy S, MD .  ketorolac (TORADOL) 30 MG/ML injection 30 mg, 30 mg, Intravenous, Q6H PRN, Opyd, Ilene Qua, MD, 30 mg at 05/05/20 0503 .  LORazepam (ATIVAN) injection 1 mg, 1 mg, Intravenous, Q4H PRN, Opyd, Ilene Qua, MD, 1 mg at 05/05/20 0944 .  magnesium oxide (MAG-OX) tablet 400 mg, 400 mg, Oral, BID, Kc, Ramesh, MD, 400 mg at 05/05/20 0942 .  nicotine (NICODERM CQ - dosed in mg/24 hours) patch 21 mg, 21 mg, Transdermal, Once, Horton, Barbette Hair, MD, 21 mg at 05/05/20 0315 .  ondansetron (ZOFRAN) tablet 4 mg, 4 mg, Oral, Q6H PRN **OR** ondansetron (ZOFRAN) injection 4 mg, 4 mg, Intravenous, Q6H PRN, Opyd, Timothy S, MD .  oxyCODONE (Oxy IR/ROXICODONE) immediate release tablet 10 mg, 10 mg, Oral, Q4H PRN, Opyd, Ilene Qua, MD, 10 mg at 05/05/20 0448 .  polyethylene glycol (MIRALAX / GLYCOLAX) packet 17 g, 17 g, Oral, Daily PRN, Opyd, Timothy S, MD .  potassium chloride SA (KLOR-CON) CR tablet 20 mEq, 20 mEq, Oral, Once, Kc, Ramesh, MD .  sodium chloride flush (NS) 0.9 % injection 3 mL, 3 mL, Intravenous, Q12H, Opyd, Ilene Qua, MD, 3 mL at 05/05/20  0944 .  vancomycin (VANCOREADY) IVPB 1500 mg/300 mL, 1,500 mg, Intravenous, Q12H, Angela Adam, [facility]  Current Outpatient Medications:  .  ibuprofen (ADVIL,MOTRIN) 200 MG tablet, Take 400 mg by mouth every 6 (six) hours as needed. For pain, Disp: , Rfl:  .  ciprofloxacin (CIPRO) 500 MG tablet, Take 1 tablet (500 mg total) by mouth every 12 (twelve) hours. (Patient not taking: Reported on 05/05/2020), Disp: 14 tablet, Rfl: 0 .  nitrofurantoin, macrocrystal-monohydrate, (MACROBID) 100 MG capsule, Take 1 capsule (100 mg total) by mouth 2 (two) times daily. (Patient not taking: Reported on 05/05/2020), Disp: 10 capsule, Rfl: 0 .  silver sulfADIAZINE (SILVADENE) 1 % cream, Apply 1 application topically daily. Thin layer after washing tid (Patient not taking: Reported on 05/05/2020), Disp: 50 g, Rfl: 1  Past Medical History:  Diagnosis Date  . History of cervical cancer   . Migraine headache     Past Surgical History:  Procedure Laterality Date  . MANDIBLE FRACTURE SURGERY      History reviewed. No pertinent family history.  Social History:  reports that she has been smoking. She has never used smokeless tobacco. She reports that she does not drink alcohol and does not use drugs.  Physical Exam Blood pressure (!) 150/76, pulse (!) 109, temperature 99.8 F (37.7 C), temperature source Oral, resp. rate 15, height 5\' 7"  (1.702 m), weight (!) 145.2 kg, SpO2 100 %. General: Alert but  does not appear oriented.  Very emotionally distraught. Right hand: Fingers well-perfused with normal capillary refill and palp radial pulse.  Sensation appears to be intact.  There is no swelling or bruising at all that I can tell.  Is difficult to really assess her pain at the moment. Left hand: Fingers are well-perfused normal cap refill and palp radial pulse.  Sensation appears to be intact.  She appears to have good range of motion.  Proximally in the forearm there is a few tense areas that are erythematous and  fluctuant.  Results for orders placed or performed during the hospital encounter of 05/04/20 (from the past 48 hour(s))  CBC with Differential     Status: Abnormal   Collection Time: 05/04/20 11:38 PM  Result Value Ref Range   WBC 16.4 (H) 4.0 - 10.5 K/uL   RBC 3.74 (L) 3.87 - 5.11 MIL/uL   Hemoglobin 10.5 (L) 12.0 - 15.0 g/dL   HCT 31.9 (L) 36 - 46 %   MCV 85.3 80.0 - 100.0 fL   MCH 28.1 26.0 - 34.0 pg   MCHC 32.9 30.0 - 36.0 g/dL   RDW 14.2 11.5 - 15.5 %   Platelets 262 150 - 400 K/uL   nRBC 0.0 0.0 - 0.2 %   Neutrophils Relative % 90 %   Neutro Abs 14.7 (H) 1.7 - 7.7 K/uL   Lymphocytes Relative 4 %   Lymphs Abs 0.7 0.7 - 4.0 K/uL   Monocytes Relative 5 %   Monocytes Absolute 0.8 0 - 1 K/uL   Eosinophils Relative 0 %   Eosinophils Absolute 0.0 0 - 0 K/uL   Basophils Relative 0 %   Basophils Absolute 0.0 0 - 0 K/uL   WBC Morphology VACUOLATED NEUTROPHILS    Immature Granulocytes 1 %   Abs Immature Granulocytes 0.21 (H) 0.00 - 0.07 K/uL    Comment: Performed at [facility], [place] [address] [place], [place] [address]  Basic metabolic panel     Status: Abnormal   Collection Time: 05/04/20 11:38 PM  Result Value Ref Range   Sodium 131 (L) 135 - 145 mmol/L   Potassium 3.1 (L) 3.5 - 5.1 mmol/L   Chloride 96 (L) 98 - 111 mmol/L   CO2 25 22 - 32 mmol/L   Glucose, Bld 104 (H) 70 - 99 mg/dL    Comment: Glucose reference range applies only to samples taken after fasting for at least 8 hours.   BUN 11 6 - 20 mg/dL   Creatinine, Ser 0.75 0.44 - 1.00 mg/dL   Calcium 8.5 (L) 8.9 - 10.3 mg/dL   GFR calc non Af Amer >60 >60 mL/min   Anion gap 10 5 - 15    Comment: Performed at [facility], [place] [address], [place], [place] [address]  Blood culture (routine x 2)     Status: None (Preliminary result)   Collection Time: 05/05/20  1:43 AM   Specimen: BLOOD  Result Value Ref Range   Specimen Description      BLOOD BLOOD LEFT WRIST Performed at  [place] [address], [place], [place] [address]    Special Requests      BOTTLES DRAWN AEROBIC AND ANAEROBIC Blood Culture adequate volume Performed at [place] [address], [place], [place] [address]    Culture      NO GROWTH < 12 HOURS Performed at [facility] [address], [place], [place] [address]  Report Status PENDING   Lactic acid, plasma     Status: None   Collection Time: 05/05/20  1:43 AM  Result Value Ref Range   Lactic Acid, Venous 1.3 0.5 - 1.9 mmol/L    Comment: Performed at [facility], [place] [address] [place], [place] [address]  Sedimentation rate     Status: Abnormal   Collection Time: 05/05/20  1:43 AM  Result Value Ref Range   Sed Rate 96 (H) 0 - 22 mm/hr    Comment: Performed at [facility], [place] [address], [place], [place] [address]  C-reactive protein     Status: Abnormal   Collection Time: 05/05/20  1:43 AM  Result Value Ref Range   CRP 23.1 (H) <1.0 mg/dL    Comment: Performed at [facility], [place] [address], [place], [place] [address]  Magnesium     Status: Abnormal   Collection Time: 05/05/20  1:43 AM  Result Value Ref Range   Magnesium 1.5 (L) 1.7 - 2.4 mg/dL    Comment: Performed at [facility], [place] [address], [place], [place] [address]  Respiratory Panel by RT PCR (Flu A&B, Covid) - Nasopharyngeal Swab     Status: None   Collection Time: 05/05/20  2:57 AM   Specimen: Nasopharyngeal Swab  Result Value Ref Range   SARS Coronavirus 2 by RT PCR NEGATIVE NEGATIVE    Comment: (NOTE) SARS-CoV-2 target nucleic acids are NOT DETECTED.  The SARS-CoV-2 RNA is generally detectable in upper respiratoy specimens during the acute phase of infection. The lowest concentration of SARS-CoV-2 viral copies this assay can detect is 131 copies/mL. A negative result does not preclude SARS-Cov-2 infection and should not be  used as the sole basis for treatment or other patient management decisions. A negative result may occur with  improper specimen collection/handling, submission of specimen other than nasopharyngeal swab, presence of viral mutation(s) within the areas targeted by this assay, and inadequate number of viral copies (<131 copies/mL). A negative result must be combined with clinical observations, patient history, and epidemiological information. The expected result is Negative.  Fact Sheet for Patients:  PinkCheek.be  Fact Sheet for Healthcare Providers:  GravelBags.it  This test is no t yet approved or cleared by the [place] FDA and  has been authorized for detection and/or diagnosis of SARS-CoV-2 by FDA under an Emergency Use Authorization (EUA). This EUA will remain  in effect (meaning this test can be used) for the duration of the COVID-19 declaration under Section 564(b)(1) of the Act, 21 U.S.C. section 360bbb-3(b)(1), unless the authorization is terminated or revoked sooner.     Influenza A by PCR NEGATIVE NEGATIVE   Influenza B by PCR NEGATIVE NEGATIVE    Comment: (NOTE) The Xpert Xpress SARS-CoV-2/FLU/RSV assay is intended as an aid in  the diagnosis of influenza from Nasopharyngeal swab specimens and  should not be used as a sole basis for treatment. Nasal washings and  aspirates are unacceptable for Xpert Xpress SARS-CoV-2/FLU/RSV  testing.  Fact Sheet for Patients: PinkCheek.be  Fact Sheet for Healthcare Providers: GravelBags.it  This test is not yet approved or cleared by the [place] FDA and  has been authorized for detection and/or diagnosis of SARS-CoV-2 by  FDA under an Emergency Use Authorization (EUA). This EUA will remain  in effect (meaning this test can be used) for the duration of the  Covid-19 declaration under Section 564(b)(1) of  the Act, 21  U.S.C. section 360bbb-3(b)(1),  unless the authorization is  terminated or revoked. Performed at [facility], [place] [address], [place], [place] [address]   Rapid urine drug screen (hospital performed)     Status: Abnormal   Collection Time: 05/05/20  3:00 AM  Result Value Ref Range   Opiates POSITIVE (A) NONE DETECTED   Cocaine NONE DETECTED NONE DETECTED   Benzodiazepines NONE DETECTED NONE DETECTED   Amphetamines POSITIVE (A) NONE DETECTED   Tetrahydrocannabinol NONE DETECTED NONE DETECTED   Barbiturates NONE DETECTED NONE DETECTED    Comment: (NOTE) DRUG SCREEN FOR MEDICAL PURPOSES ONLY.  IF CONFIRMATION IS NEEDED FOR ANY PURPOSE, NOTIFY LAB WITHIN 5 DAYS.  LOWEST DETECTABLE LIMITS FOR URINE DRUG SCREEN Drug Class                     Cutoff (ng/mL) Amphetamine and metabolites    1000 Barbiturate and metabolites    200 Benzodiazepine                 628 Tricyclics and metabolites     300 Opiates and metabolites        300 Cocaine and metabolites        300 THC                            50 Performed at [facility], [place] [address] [place], [place] [address]   CBC     Status: Abnormal   Collection Time: 05/05/20  9:17 AM  Result Value Ref Range   WBC 21.4 (H) 4.0 - 10.5 K/uL   RBC 3.49 (L) 3.87 - 5.11 MIL/uL   Hemoglobin 9.7 (L) 12.0 - 15.0 g/dL   HCT 29.2 (L) 36 - 46 %   MCV 83.7 80.0 - 100.0 fL   MCH 27.8 26.0 - 34.0 pg   MCHC 33.2 30.0 - 36.0 g/dL   RDW 14.3 11.5 - 15.5 %   Platelets 235 150 - 400 K/uL   nRBC 0.0 0.0 - 0.2 %    Comment: Performed at [facility], [place] [address], [place], [place] [address]  Basic metabolic panel     Status: Abnormal   Collection Time: 05/05/20  9:17 AM  Result Value Ref Range   Sodium 133 (L) 135 - 145 mmol/L   Potassium 3.3 (L) 3.5 - 5.1 mmol/L   Chloride 98 98 - 111 mmol/L   CO2 23 22 - 32 mmol/L   Glucose, Bld 97 70 - 99 mg/dL    Comment: Glucose  reference range applies only to samples taken after fasting for at least 8 hours.   BUN 9 6 - 20 mg/dL   Creatinine, Ser 0.72 0.44 - 1.00 mg/dL   Calcium 8.3 (L) 8.9 - 10.3 mg/dL   GFR calc non Af Amer >60 >60 mL/min   Anion gap 12 5 - 15    Comment: Performed at [facility], [place] [address] [place], [place] [address]    DG Wrist Complete Right  Result Date: 05/04/2020 CLINICAL DATA:  Pain EXAM: RIGHT WRIST - COMPLETE 3+ VIEW COMPARISON:  None. FINDINGS: There is no evidence of fracture or dislocation. There is no evidence of arthropathy or other focal bone abnormality. Soft tissues are unremarkable. IMPRESSION: Negative. Electronically Signed   By: Constance Holster M.D.   On: 05/04/2020 23:05   DG Foot Complete Right  Result Date: 05/04/2020 CLINICAL DATA:  Pain and  swelling EXAM: RIGHT FOOT COMPLETE - 3+ VIEW COMPARISON:  None. FINDINGS: There is no evidence of fracture or dislocation. There are degenerative changes of the midfoot. There is a large plantar calcaneal spur. There is soft tissue swelling about the foot. IMPRESSION: Soft tissue swelling without evidence of an acute fracture or dislocation. Electronically Signed   By: Constance Holster M.D.   On: 05/04/2020 23:06    Assessment/Plan: Patient with a history of substance abuse presents with right wrist pain and left forearm infection.  I think the left forearm should be drained.  We will plan to do this today.  She does have an elevated white blood cell count and tachycardia which could be explained by the left forearm abscess.  I have a low index of suspicion for septic arthritis of the right wrist however I could perform a tap at the same time when she is under anesthesia.  Unlikely to have much healed but given her challenges with exam at this point in time it would be reasonable to do.  Please keep her n.p.o. as I am planning on doing this this afternoon.  Cindra Presume 05/05/2020, 11:04 AM

## 2020-05-05 NOTE — ED Notes (Signed)
This RN walked in room to find Patient sitting at the edge of bed with IV out, no pulse ox, BP cuff, or monitor on, and bed soiled. This RN got pt cleaned up, linen changed and back in bed. Pt back on monitor and vitals cycling. Pt made aware of importance to keep equipment on. Attempted to start IV with no success. Will let IV team know & will continue to monitor.

## 2020-05-05 NOTE — ED Notes (Signed)
1 set of blood cultures collected, unable to collect a second set.

## 2020-05-05 NOTE — ED Notes (Addendum)
Patient refusing to keep monitoring equipment in place. Patient still tearful and inconsolable. Patient placed on purewick due to her inability to walk.

## 2020-05-05 NOTE — Anesthesia Preprocedure Evaluation (Addendum)
Anesthesia Evaluation  Patient identified by MRN, date of birth, ID band Patient awake    Reviewed: Allergy & Precautions, NPO status , Patient's Chart, lab work & pertinent test results  Airway Mallampati: II  TM Distance: >3 FB Neck ROM: Full    Dental  (+) Edentulous Lower, Edentulous Upper   Pulmonary Current Smoker,    breath sounds clear to auscultation       Cardiovascular negative cardio ROS   Rhythm:Regular Rate:Normal     Neuro/Psych  Headaches, negative psych ROS   GI/Hepatic (+)     substance abuse  IV drug use,   Endo/Other    Renal/GU      Musculoskeletal   Abdominal (+) + obese,   Peds  Hematology   Anesthesia Other Findings   Reproductive/Obstetrics                            Anesthesia Physical Anesthesia Plan  ASA: III  Anesthesia Plan: General   Post-op Pain Management:    Induction: Intravenous  PONV Risk Score and Plan: 3 and Ondansetron and Midazolam  Airway Management Planned: LMA  Additional Equipment: None  Intra-op Plan:   Post-operative Plan: Extubation in OR  Informed Consent: I have reviewed the patients History and Physical, chart, labs and discussed the procedure including the risks, benefits and alternatives for the proposed anesthesia with the patient or authorized representative who has indicated his/her understanding and acceptance.     Dental advisory given  Plan Discussed with: CRNA  Anesthesia Plan Comments:        Anesthesia Quick Evaluation

## 2020-05-06 ENCOUNTER — Encounter (HOSPITAL_COMMUNITY): Payer: Self-pay | Admitting: Plastic Surgery

## 2020-05-06 DIAGNOSIS — A419 Sepsis, unspecified organism: Secondary | ICD-10-CM | POA: Diagnosis present

## 2020-05-06 LAB — SEDIMENTATION RATE: Sed Rate: 105 mm/hr — ABNORMAL HIGH (ref 0–22)

## 2020-05-06 LAB — BASIC METABOLIC PANEL
Anion gap: 9 (ref 5–15)
BUN: 11 mg/dL (ref 6–20)
CO2: 25 mmol/L (ref 22–32)
Calcium: 8.1 mg/dL — ABNORMAL LOW (ref 8.9–10.3)
Chloride: 100 mmol/L (ref 98–111)
Creatinine, Ser: 0.6 mg/dL (ref 0.44–1.00)
GFR calc non Af Amer: >60 mL/min (ref >60)
Glucose, Bld: 90 mg/dL (ref 70–99)
Potassium: 3.6 mmol/L (ref 3.5–5.1)
Sodium: 134 mmol/L — ABNORMAL LOW (ref 135–145)

## 2020-05-06 LAB — CBC
HCT: 28.8 % — ABNORMAL LOW (ref 36.0–46.0)
Hemoglobin: 9.2 g/dL — ABNORMAL LOW (ref 12.0–15.0)
MCH: 27.7 pg (ref 26.0–34.0)
MCHC: 31.9 g/dL (ref 30.0–36.0)
MCV: 86.7 fL (ref 80.0–100.0)
Platelets: 245 10*3/uL (ref 150–400)
RBC: 3.32 MIL/uL — ABNORMAL LOW (ref 3.87–5.11)
RDW: 14.7 % (ref 11.5–15.5)
WBC: 19.5 10*3/uL — ABNORMAL HIGH (ref 4.0–10.5)
nRBC: 0 % (ref 0.0–0.2)

## 2020-05-06 LAB — PHOSPHORUS: Phosphorus: 3.4 mg/dL (ref 2.5–4.6)

## 2020-05-06 LAB — MAGNESIUM: Magnesium: 2.2 mg/dL (ref 1.7–2.4)

## 2020-05-06 LAB — C-REACTIVE PROTEIN: CRP: 24.7 mg/dL — ABNORMAL HIGH (ref <1.0)

## 2020-05-06 NOTE — Progress Notes (Signed)
PROGRESS NOTE    Marie Brewer  BDZ:[id number] DOB: Dec 06, 1980 DOA: 05/04/2020 PCP: Pcp, No   Chief Complaint  Patient presents with  . Wrist Pain    Brief Narrative: 39 y.o. female with medical history significant for cervical cancer treated with chemoradiation 10 years ago, polysubstance abuse, and now presenting to the emergency department for evaluation of severe right wrist pain.  Patient reports longstanding erythema and ulcerations involving the bilateral lower legs, notes some more recent redness and swelling of the left forearm, but is primarily concerned with severe right wrist pain.  Patient reports that she put her weight on her right hand last night, heard a "pop," and has been experiencing severe pain since.  She has difficulty moving the wrist due to severe pain.  She has not noticed any drainage from the right wrist but reports occasional purulent drainage from her lower leg wounds.  She eventually acknowledges IV drug use in the ED.  History and exam are somewhat limited by the patient's agitation and poor cooperation.  ED Course: Upon arrival to the ED, patient is found to be afebrile, saturating well on room air, tachycardic to 130, and with stable blood pressure.  Radiographs of the right foot demonstrate soft tissue swelling without acute fracture or dislocation.  Radiographs of the right wrist are negative.  Chemistry panel is notable for sodium of 131 and potassium 3.4.  CBC features a normocytic anemia and leukocytosis to 16,400.  ESR is elevated to 96, CRP elevated to 23, and lactic acid is reassuringly normal.  UDS is positive for amphetamines and opiates.  Covid screening test not yet performed.  Blood cultures were collected in the ED, 1 L of saline was administered, and the patient was treated with vancomycin in addition to analgesics, Ativan, and droperidol.  Subjective: Seen this morning resting comfortably. on 2 l [place]. Aaox3. Denies drug use. Cellulitis on LE with  open wound on and multiple marks- reports it is much better than before.  Assessment & Plan:  Sepsis due cellulitis and abscess: Right wrist pain and left forearm abscess: Appreciate plastic surgery input status post right wrist radiocarpal joint aspiration and incision and drainage of the left forearm abscess x2 on 10/6.  Afebrile overnight, patient downtrending.Blood culture no growth so far. Culture with abundant WBCs, abundant gram-positive cocci,continue on vancomycin IV.  Continue pain control Tylenol, Toradol and oxycodone. Consulted  Dr Baxter Flattery from [facility].  Cllulitis TMH:[id number] to vital signs above.  Patient reports lower limb redness and swelling is much better than last week.  Polysubstance abuse/IVDA, UDS positive for amphetamines, opiates.  Patient is not forthcoming about drug use.Reports she was bitten by an insect letter similar infection in the hand.  She denies any IV drug use.  She reports she was using ecstasy.  Continue as needed Ativan.  Hypokalemia: improved  Hyponatremia, mild.  Improving.  Anemia likely from anemia of chronic disease and inflammation.  Monitor hemoglobin. . Discussed about her current infection and also adverse effects of drug abuse.  I requested her to stay in the hospital and completed treatment and not to leave [facility] which could lead into worsening of infection sepsis significant disability including death. She verbalized understanding and agrees with current recommendation. Nutrition: Diet Order            Diet regular Room service appropriate? Yes; Fluid consistency: Thin  Diet effective now                 Morbid  Obesity with BMI Body mass index is 50.12 kg/m.  Will benefit with weight loss and healthy lifestyle.  DVT prophylaxis: SCDs Start: 05/05/20 0426 Code Status:   Code Status: Full Code Family Communication: plan of care discussed with patient at bedside.  Status is:  Admitted as Observation Patient will need at  least 2 midnight stay for ongoing management of  cellulitis sepsis with IV antibiotics Dispo: The patient is from: Home              Anticipated d/c is to: Home              Anticipated d/c date is: 2 days              Patient currently is not medically stable to d/c.  Consultants:see note  Procedures:see note  Culture/Microbiology    Component Value Date/Time   SDES  05/05/2020 1804    ABSCESS LEFT FOREARM 2 Performed at [facility], [place] [address], [place], [place] [address]    SPECREQUEST  05/05/2020 1804    NONE Performed at [facility], [place] [address] [place], [place] [address]    CULT PENDING 05/05/2020 1804   REPTSTATUS PENDING 05/05/2020 1804    Other culture-see note  Medications: Scheduled Meds: . magnesium oxide  400 mg Oral BID  . sodium chloride flush  3 mL Intravenous Q12H   Continuous Infusions: . vancomycin 1,500 mg (05/06/20 3419)    Antimicrobials: Anti-infectives (From admission, onward)   Start     Dose/Rate Route Frequency Ordered Stop   05/06/20 0500  vancomycin (VANCOREADY) IVPB 1500 mg/300 mL        1,500 mg 150 mL/hr over 120 Minutes Intravenous Every 12 hours 05/05/20 1624     05/05/20 2200  vancomycin (VANCOREADY) IVPB 1500 mg/300 mL  Status:  Discontinued        1,500 mg 150 mL/hr over 120 Minutes Intravenous Every 12 hours 05/05/20 0429 05/05/20 1624   05/05/20 1621  vancomycin (VANCOCIN) 1-5 GM/200ML-% IVPB       Note to Pharmacy: Charmayne Sheer   : cabinet override      05/05/20 1621 05/06/20 0429   05/05/20 0430  vancomycin (VANCOREADY) IVPB 2000 mg/400 mL        2,000 mg 200 mL/hr over 120 Minutes Intravenous  Once 05/05/20 0418 05/05/20 0701   05/05/20 0030  vancomycin (VANCOCIN) IVPB 1000 mg/200 mL premix        1,000 mg 200 mL/hr over 60 Minutes Intravenous  Once 05/05/20 0027 05/05/20 0241     Objective: Vitals: Today's Vitals   05/06/20 0644 05/06/20 0800 05/06/20 0929 05/06/20  1351  BP:      Pulse:      Resp: 18     Temp:      TempSrc:      SpO2:      Weight:      Height:      PainSc:  8  Asleep 8     Intake/Output Summary (Last 24 hours) at 05/06/2020 1538 Last data filed at 05/06/2020 1300 Gross per 24 hour  Intake 2192.5 ml  Output --  Net 2192.5 ml   Filed Weights   05/05/20 0949  Weight: (!) 145.2 kg   Weight change:   Intake/Output from previous day: 10/06 0701 - 10/07 0700 In: 2487.5 [I.V.:1789; IV Piggyback:698.5] Out: -  Intake/Output this shift: Total I/O In: 480 [P.O.:480] Out: -   Examination: General exam: AAO, obese, mildly sleepy  but woke up and interactive, old for her age  HEENT:Oral mucosa moist, Ear/Nose WNL grossly,dentition normal. Respiratory system: bilaterally clear,no wheezing or crackles,no use of accessory muscle, non tender. Cardiovascular system: S1 & S2 +, regular, No JVD. Gastrointestinal system: Abdomen soft, NT,ND, BS+. Nervous System:Alert, awake, moving extremities and grossly nonfocal Extremities: rt wrist swollen slightly, left forearm in dressing distal peripheral pulses palpable.  Lower extremities with wounds at different stages of healing and extremities with redness erythema swelling Skin: No rashes,no icterus. MSK: Normal muscle bulk,tone, power  Data Reviewed: I have personally reviewed following labs and imaging studies CBC: Recent Labs  Lab 05/04/20 2338 05/05/20 0917 05/06/20 0527  WBC 16.4* 21.4* 19.5*  NEUTROABS 14.7*  --   --   HGB 10.5* 9.7* 9.2*  HCT 31.9* 29.2* 28.8*  MCV 85.3 83.7 86.7  PLT 262 235 659   Basic Metabolic Panel: Recent Labs  Lab 05/04/20 2338 05/05/20 0143 05/05/20 0917 05/06/20 0527  NA 131*  --  133* 134*  K 3.1*  --  3.3* 3.6  CL 96*  --  98 100  CO2 25  --  23 25  GLUCOSE 104*  --  97 90  BUN 11  --  9 11  CREATININE 0.75  --  0.72 0.60  CALCIUM 8.5*  --  8.3* 8.1*  MG  --  1.5*  --  2.2  PHOS  --   --   --  3.4   GFR: Estimated Creatinine  Clearance: 140.2 mL/min (by C-G formula based on SCr of 0.6 mg/dL). Liver Function Tests: No results for input(s): AST, ALT, ALKPHOS, BILITOT, PROT, ALBUMIN in the last 168 hours. No results for input(s): LIPASE, AMYLASE in the last 168 hours. No results for input(s): AMMONIA in the last 168 hours. Coagulation Profile: No results for input(s): INR, PROTIME in the last 168 hours. Cardiac Enzymes: No results for input(s): CKTOTAL, CKMB, CKMBINDEX, TROPONINI in the last 168 hours. BNP (last 3 results) No results for input(s): PROBNP in the last 8760 hours. HbA1C: No results for input(s): HGBA1C in the last 72 hours. CBG: No results for input(s): GLUCAP in the last 168 hours. Lipid Profile: No results for input(s): CHOL, HDL, LDLCALC, TRIG, CHOLHDL, LDLDIRECT in the last 72 hours. Thyroid Function Tests: No results for input(s): TSH, T4TOTAL, FREET4, T3FREE, THYROIDAB in the last 72 hours. Anemia Panel: No results for input(s): VITAMINB12, FOLATE, FERRITIN, TIBC, IRON, RETICCTPCT in the last 72 hours. Sepsis Labs: Recent Labs  Lab 05/05/20 0143  LATICACIDVEN 1.3    Recent Results (from the past 240 hour(s))  Blood culture (routine x 2)     Status: None (Preliminary result)   Collection Time: 05/05/20  1:43 AM   Specimen: BLOOD  Result Value Ref Range Status   Specimen Description   Final    BLOOD BLOOD LEFT WRIST Performed at [facility] [address] [place], [place] [address]    Special Requests   Final    BOTTLES DRAWN AEROBIC AND ANAEROBIC Blood Culture adequate volume Performed at [facility] [address] [place], [place] [address]    Culture   Final    NO GROWTH < 12 HOURS Performed at [facility] [address] [place], [place] [address]    Report Status PENDING  Incomplete  Respiratory Panel by RT PCR (Flu A&B, Covid) - Nasopharyngeal Swab     Status: None   Collection Time: 05/05/20  2:57 AM  Specimen:  Nasopharyngeal Swab  Result Value Ref Range Status   SARS Coronavirus 2 by RT PCR NEGATIVE NEGATIVE Final    Comment: (NOTE) SARS-CoV-2 target nucleic acids are NOT DETECTED.  The SARS-CoV-2 RNA is generally detectable in upper respiratoy specimens during the acute phase of infection. The lowest concentration of SARS-CoV-2 viral copies this assay can detect is 131 copies/mL. A negative result does not preclude SARS-Cov-2 infection and should not be used as the sole basis for treatment or other patient management decisions. A negative result may occur with  improper specimen collection/handling, submission of specimen other than nasopharyngeal swab, presence of viral mutation(s) within the areas targeted by this assay, and inadequate number of viral copies (<131 copies/mL). A negative result must be combined with clinical observations, patient history, and epidemiological information. The expected result is Negative.  Fact Sheet for Patients:  PinkCheek.be  Fact Sheet for Healthcare Providers:  GravelBags.it  This test is no t yet approved or cleared by the [place] FDA and  has been authorized for detection and/or diagnosis of SARS-CoV-2 by FDA under an Emergency Use Authorization (EUA). This EUA will remain  in effect (meaning this test can be used) for the duration of the COVID-19 declaration under Section 564(b)(1) of the Act, 21 U.S.C. section 360bbb-3(b)(1), unless the authorization is terminated or revoked sooner.     Influenza A by PCR NEGATIVE NEGATIVE Final   Influenza B by PCR NEGATIVE NEGATIVE Final    Comment: (NOTE) The Xpert Xpress SARS-CoV-2/FLU/RSV assay is intended as an aid in  the diagnosis of influenza from Nasopharyngeal swab specimens and  should not be used as a sole basis for treatment. Nasal washings and  aspirates are unacceptable for Xpert Xpress SARS-CoV-2/FLU/RSV  testing.  Fact Sheet  for Patients: PinkCheek.be  Fact Sheet for Healthcare Providers: GravelBags.it  This test is not yet approved or cleared by the [place] FDA and  has been authorized for detection and/or diagnosis of SARS-CoV-2 by  FDA under an Emergency Use Authorization (EUA). This EUA will remain  in effect (meaning this test can be used) for the duration of the  Covid-19 declaration under Section 564(b)(1) of the Act, 21  U.S.C. section 360bbb-3(b)(1), unless the authorization is  terminated or revoked. Performed at [facility], [place] [address] [place], [place] [address]   Aerobic/Anaerobic Culture (surgical/deep wound)     Status: None (Preliminary result)   Collection Time: 05/05/20  5:51 PM   Specimen: Joint, Other; Body Fluid  Result Value Ref Range Status   Specimen Description   Final    SYNOVIAL RIGHT WRIST Performed at [facility] [address], [place], [place] [address]    Special Requests   Final    NONE Performed at [facility], [place] [address] [place], [place] [address]    Gram Stain   Final    RARE WBC PRESENT, PREDOMINANTLY PMN NO ORGANISMS SEEN Performed at [facility], [place] [address] [place], [place] [address]    Culture PENDING  Incomplete   Report Status PENDING  Incomplete  Aerobic/Anaerobic Culture (surgical/deep wound)     Status: None (Preliminary result)   Collection Time: 05/05/20  5:53 PM   Specimen: Wound  Result Value Ref Range Status   Specimen Description   Final    ABSCESS LEFT FOREARM 1 Performed at [facility] [address], [place], [place] [address]    Special Requests   Final  NONE Performed at [facility], [place] [address] [place], [place] [address]    Gram Stain   Final    ABUNDANT WBC PRESENT, PREDOMINANTLY PMN ABUNDANT GRAM POSITIVE COCCI Performed at [facility], [place] [address] [place], [place] [address]    Culture PENDING  Incomplete   Report Status PENDING  Incomplete  Aerobic/Anaerobic Culture (surgical/deep wound)     Status: None (Preliminary result)   Collection Time: 05/05/20  6:04 PM   Specimen: Wound  Result Value Ref Range Status   Specimen Description   Final    ABSCESS LEFT FOREARM 2 Performed at [facility] [address], [place], [place] [address]    Special Requests   Final    NONE Performed at [facility], [place] [address] [place], [place] [address]    Gram Stain   Final    ABUNDANT WBC PRESENT, PREDOMINANTLY PMN ABUNDANT GRAM POSITIVE COCCI Performed at [facility], [place] [address] [place], [place] [address]    Culture PENDING  Incomplete   Report Status PENDING  Incomplete     Radiology Studies: DG Wrist Complete Right  Result Date: 05/04/2020 CLINICAL DATA:  Pain EXAM: RIGHT WRIST - COMPLETE 3+ VIEW COMPARISON:  None. FINDINGS: There is no evidence of fracture or dislocation. There is no evidence of arthropathy or other focal bone abnormality. Soft tissues are unremarkable. IMPRESSION: Negative. Electronically Signed   By: Constance Holster M.D.   On: 05/04/2020 23:05   DG Foot Complete Right  Result Date: 05/04/2020 CLINICAL DATA:  Pain and swelling EXAM: RIGHT FOOT COMPLETE - 3+ VIEW COMPARISON:  None. FINDINGS: There is no evidence of fracture or dislocation. There are degenerative changes of the midfoot. There is a large plantar calcaneal spur. There is soft tissue swelling about the foot. IMPRESSION: Soft tissue swelling without evidence of an acute fracture or dislocation. Electronically Signed   By: Constance Holster M.D.   On: 05/04/2020 23:06     LOS: 0 days   Antonieta Pert, MD [facility]  05/06/2020, 3:38 PM

## 2020-05-06 NOTE — Progress Notes (Signed)
Patient is a 39 yr-old female who underwent right wrist radiocarpal joint aspiration and incision & drainage of left forearm abscesses yesterday with Dr. Claudia Desanctis. Patient tolerated procedure well.  Do daily dressing changes to left arm abscesses consisting of Iodoform packing gauze and cover with 4x4 gauze, kerlix, and wrap with ACE wrap.

## 2020-05-06 NOTE — TOC Progression Note (Signed)
Transition of Care [facility]) - Progression Note    Patient Details  Name: Marie Brewer MRN: [id number] Date of Birth: 27-Jul-1981  Transition of Care [facility] CM/SW Contact  Purcell Mouton, RN Phone Number: 05/06/2020, 9:15 AM  Clinical Narrative:    Pt from home with wife. TOC reviewed chart.   Expected Discharge Plan: Home/Self Care Barriers to Discharge: No Barriers Identified  Expected Discharge Plan and Services Expected Discharge Plan: Home/Self Care       Living arrangements for the past 2 months: Single Family Home                                       Social Determinants of Health (SDOH) Interventions    Readmission Risk Interventions No flowsheet data found.

## 2020-05-06 NOTE — Consult Note (Signed)
[facility]  Total days of antibiotics 3               Reason for Consult: cellulitis and SSTI   Referring Physician: Maren Beach  Principal Problem:   Sepsis due to cellulitis [facility]) Active Problems:   Polysubstance abuse ([place])   Hypokalemia   Hyponatremia   Sepsis ([place])    HPI: Marie Brewer is a 39 y.o. female with history of drug use, denies injecting but thinks she had insect bite that led to multiple areas on all extremities with cellulitis, deep tissue abscess to left forearm s/p I x D  X 2 by dr pace, as well as aspiration of right wrist for concern of septic arthritis. She has numerous lower extremity skin lesions c/w fat necrosis from previous injection sites to her lower legs, near ankles. She had elevated wbc and fevers on admit, and unable to give full story due to intoxication. +UDS for amphetamines and opiates. Her cx are growing GPCs still awaiting identification. She has been started on vancomycin. She reports much pain to her right hand due to pronounced swelling.  Past Medical History:  Diagnosis Date  . History of cervical cancer   . Migraine headache     Allergies:  Allergies  Allergen Reactions  . Amoxicillin-Pot Clavulanate Anaphylaxis  . Imitrex [Sumatriptan Base] Other (See Comments)    Heart races  . Cephalexin Rash     MEDICATIONS: . magnesium oxide  400 mg Oral BID  . sodium chloride flush  3 mL Intravenous Q12H    Social History   Tobacco Use  . Smoking status: Current Every Day Smoker  . Smokeless tobacco: Never Used  Substance Use Topics  . Alcohol use: No  . Drug use: No    History reviewed. No pertinent family history.   Review of Systems  Constitutional: Negative for fever, chills, diaphoresis, activity change, appetite change, fatigue and unexpected weight change.  HENT: Negative for congestion, sore throat, rhinorrhea, sneezing, trouble swallowing and sinus pressure.  Eyes: Negative for photophobia and  visual disturbance.  Respiratory: Negative for cough, chest tightness, shortness of breath, wheezing and stridor.  Cardiovascular: Negative for chest pain, palpitations and leg swelling.  Gastrointestinal: Negative for nausea, vomiting, abdominal pain, diarrhea, constipation, blood in stool, abdominal distention and anal bleeding.  Genitourinary: Negative for dysuria, hematuria, flank pain and difficulty urinating.  Musculoskeletal: Negative for myalgias, back pain, joint swelling, arthralgias and gait problem.  Skin:+wounds Negative for color change, pallor, rash and wound.  Neurological: Negative for dizziness, tremors, weakness and light-headedness.  Hematological: Negative for adenopathy. Does not bruise/bleed easily.  Psychiatric/Behavioral: Negative for behavioral problems, confusion, sleep disturbance, dysphoric mood, decreased concentration and agitation.     OBJECTIVE: Temp:  [97.8 F (36.6 C)-98.6 F (37 C)] 97.8 F (36.6 C) (10/07 0615) Pulse Rate:  [81-112] 112 (10/07 0615) Resp:  [16-30] 18 (10/07 0644) BP: (126-165)/(73-107) 165/73 (10/07 0615) SpO2:  [94 %-100 %] 100 % (10/07 0615) Physical Exam  Constitutional:  oriented to person, place, and time. appears well-developed and well-nourished. No distress.  HENT: [place]/AT, PERRLA, no scleral icterus Mouth/Throat: Oropharynx is clear and moist. No oropharyngeal exudate.  Cardiovascular: Normal rate, regular rhythm and normal heart sounds. Exam reveals no gallop and no friction rub.  No murmur heard.  Pulmonary/Chest: Effort normal and breath sounds normal. No respiratory distress.  has no wheezes.  Neck = supple, no nuchal rigidity Abdominal: Soft. Bowel sounds are normal.  exhibits no distension.  There is no tenderness.  Lymphadenopathy: no cervical adenopathy. No axillary adenopathy Neurological: alert and oriented to person, place, and time.  Skin: Skin is warm and dry. No rash noted. No erythema. Scattered shallow ulcers  to legs c/w fat necrosis. Swollen right hand digits, and dorsum of hand, left forearm is wrapped with wicking in place from I x D Psychiatric: a normal mood and affect.  behavior is normal.    LABS: Results for orders placed or performed during the hospital encounter of 05/04/20 (from the past 48 hour(s))  CBC with Differential     Status: Abnormal   Collection Time: 05/04/20 11:38 PM  Result Value Ref Range   WBC 16.4 (H) 4.0 - 10.5 K/uL   RBC 3.74 (L) 3.87 - 5.11 MIL/uL   Hemoglobin 10.5 (L) 12.0 - 15.0 g/dL   HCT 31.9 (L) 36 - 46 %   MCV 85.3 80.0 - 100.0 fL   MCH 28.1 26.0 - 34.0 pg   MCHC 32.9 30.0 - 36.0 g/dL   RDW 14.2 11.5 - 15.5 %   Platelets 262 150 - 400 K/uL   nRBC 0.0 0.0 - 0.2 %   Neutrophils Relative % 90 %   Neutro Abs 14.7 (H) 1.7 - 7.7 K/uL   Lymphocytes Relative 4 %   Lymphs Abs 0.7 0.7 - 4.0 K/uL   Monocytes Relative 5 %   Monocytes Absolute 0.8 0.1 - 1.0 K/uL   Eosinophils Relative 0 %   Eosinophils Absolute 0.0 0 - 0 K/uL   Basophils Relative 0 %   Basophils Absolute 0.0 0 - 0 K/uL   WBC Morphology VACUOLATED NEUTROPHILS    Immature Granulocytes 1 %   Abs Immature Granulocytes 0.21 (H) 0.00 - 0.07 K/uL    Comment: Performed at [facility], [place] [address], [place], [place] [address]  Basic metabolic panel     Status: Abnormal   Collection Time: 05/04/20 11:38 PM  Result Value Ref Range   Sodium 131 (L) 135 - 145 mmol/L   Potassium 3.1 (L) 3.5 - 5.1 mmol/L   Chloride 96 (L) 98 - 111 mmol/L   CO2 25 22 - 32 mmol/L   Glucose, Bld 104 (H) 70 - 99 mg/dL    Comment: Glucose reference range applies only to samples taken after fasting for at least 8 hours.   BUN 11 6 - 20 mg/dL   Creatinine, Ser 0.75 0.44 - 1.00 mg/dL   Calcium 8.5 (L) 8.9 - 10.3 mg/dL   GFR calc non Af Amer >60 >60 mL/min   Anion gap 10 5 - 15    Comment: Performed at [facility], [place] [address], [place], [place] [address]  Blood culture (routine x  2)     Status: None (Preliminary result)   Collection Time: 05/05/20  1:43 AM   Specimen: BLOOD  Result Value Ref Range   Specimen Description      BLOOD BLOOD LEFT WRIST Performed at [facility] [address], [place], [place] [address]    Special Requests      BOTTLES DRAWN AEROBIC AND ANAEROBIC Blood Culture adequate volume Performed at [facility] [address], [place],  [address]    Culture      NO GROWTH 1 DAY Performed at [facility] [address], [place],  [address]    Report Status PENDING   Lactic acid, plasma     Status: None   Collection  Time: 05/05/20  1:43 AM  Result Value Ref Range   Lactic Acid, Venous 1.3 0.5 - 1.9 mmol/L    Comment: Performed at [facility], [place] [address], [place], [place] [address]  Sedimentation rate     Status: Abnormal   Collection Time: 05/05/20  1:43 AM  Result Value Ref Range   Sed Rate 96 (H) 0 - 22 mm/hr    Comment: Performed at [facility], [place] [address] [place], [place] [address]  C-reactive protein     Status: Abnormal   Collection Time: 05/05/20  1:43 AM  Result Value Ref Range   CRP 23.1 (H) <1.0 mg/dL    Comment: Performed at [facility], [place] [address] [place], [place] [address]  Magnesium     Status: Abnormal   Collection Time: 05/05/20  1:43 AM  Result Value Ref Range   Magnesium 1.5 (L) 1.7 - 2.4 mg/dL    Comment: Performed at [facility], [place] [address], [place], [place] [address]  Respiratory Panel by RT PCR (Flu A&B, Covid) - Nasopharyngeal Swab     Status: None   Collection Time: 05/05/20  2:57 AM   Specimen: Nasopharyngeal Swab  Result Value Ref Range   SARS Coronavirus 2 by RT PCR NEGATIVE NEGATIVE    Comment: (NOTE) SARS-CoV-2 target nucleic acids are NOT DETECTED.  The SARS-CoV-2 RNA is generally detectable in upper respiratoy specimens during the acute phase  of infection. The lowest concentration of SARS-CoV-2 viral copies this assay can detect is 131 copies/mL. A negative result does not preclude SARS-Cov-2 infection and should not be used as the sole basis for treatment or other patient management decisions. A negative result may occur with  improper specimen collection/handling, submission of specimen other than nasopharyngeal swab, presence of viral mutation(s) within the areas targeted by this assay, and inadequate number of viral copies (<131 copies/mL). A negative result must be combined with clinical observations, patient history, and epidemiological information. The expected result is Negative.  Fact Sheet for Patients:  PinkCheek.be  Fact Sheet for Healthcare Providers:  GravelBags.it  This test is no t yet approved or cleared by the [place] FDA and  has been authorized for detection and/or diagnosis of SARS-CoV-2 by FDA under an Emergency Use Authorization (EUA). This EUA will remain  in effect (meaning this test can be used) for the duration of the COVID-19 declaration under Section 564(b)(1) of the Act, 21 U.S.C. section 360bbb-3(b)(1), unless the authorization is terminated or revoked sooner.     Influenza A by PCR NEGATIVE NEGATIVE   Influenza B by PCR NEGATIVE NEGATIVE    Comment: (NOTE) The Xpert Xpress SARS-CoV-2/FLU/RSV assay is intended as an aid in  the diagnosis of influenza from Nasopharyngeal swab specimens and  should not be used as a sole basis for treatment. Nasal washings and  aspirates are unacceptable for Xpert Xpress SARS-CoV-2/FLU/RSV  testing.  Fact Sheet for Patients: PinkCheek.be  Fact Sheet for Healthcare Providers: GravelBags.it  This test is not yet approved or cleared by the [place] FDA and  has been authorized for detection and/or diagnosis of SARS-CoV-2 by  FDA  under an Emergency Use Authorization (EUA). This EUA will remain  in effect (meaning this test can be used) for the duration of the  Covid-19 declaration under Section 564(b)(1) of the Act, 21  U.S.C. section 360bbb-3(b)(1), unless the authorization is  terminated or revoked. Performed at [facility], [address] [place], [place] [address]   Rapid urine drug screen (hospital performed)     Status: Abnormal   Collection Time: 05/05/20  3:00 AM  Result Value Ref Range   Opiates POSITIVE (A) NONE DETECTED   Cocaine NONE DETECTED NONE DETECTED   Benzodiazepines NONE DETECTED NONE DETECTED   Amphetamines POSITIVE (A) NONE DETECTED   Tetrahydrocannabinol NONE DETECTED NONE DETECTED   Barbiturates NONE DETECTED NONE DETECTED    Comment: (NOTE) DRUG SCREEN FOR MEDICAL PURPOSES ONLY.  IF CONFIRMATION IS NEEDED FOR ANY PURPOSE, NOTIFY LAB WITHIN 5 DAYS.  LOWEST DETECTABLE LIMITS FOR URINE DRUG SCREEN Drug Class                     Cutoff (ng/mL) Amphetamine and metabolites    1000 Barbiturate and metabolites    200 Benzodiazepine                 751 Tricyclics and metabolites     300 Opiates and metabolites        300 Cocaine and metabolites        300 THC                            50 Performed at [facility], [place] [address], [place], [place] [address]   Pregnancy, urine     Status: None   Collection Time: 05/05/20  3:00 AM  Result Value Ref Range   Preg Test, Ur NEGATIVE NEGATIVE    Comment:        THE SENSITIVITY OF THIS METHODOLOGY IS >20 mIU/mL. Performed at [facility], [place] [address], [place], [place] [address]   Blood culture (routine x 2)     Status: None (Preliminary result)   Collection Time: 05/05/20  9:17 AM   Specimen: BLOOD  Result Value Ref Range   Specimen Description      BLOOD BLOOD RIGHT FOREARM Performed at [facility] [address], [place], [place] [address]     Special Requests      BOTTLES DRAWN AEROBIC AND ANAEROBIC Blood Culture adequate volume Performed at [facility] [address], [place], [place] [address]    Culture      NO GROWTH 1 DAY Performed at [facility] [address], [place], [place] [address]    Report Status PENDING   HIV Antibody (routine testing w rflx)     Status: None   Collection Time: 05/05/20  9:17 AM  Result Value Ref Range   HIV Screen 4th Generation wRfx Non Reactive Non Reactive    Comment: Performed at [facility], [place] [address], [place], [place] [address]  CBC     Status: Abnormal   Collection Time: 05/05/20  9:17 AM  Result Value Ref Range   WBC 21.4 (H) 4.0 - 10.5 K/uL   RBC 3.49 (L) 3.87 - 5.11 MIL/uL   Hemoglobin 9.7 (L) 12.0 - 15.0 g/dL   HCT 29.2 (L) 36 - 46 %   MCV 83.7 80.0 - 100.0 fL   MCH 27.8 26.0 - 34.0 pg   MCHC 33.2 30.0 - 36.0 g/dL   RDW 14.3 11.5 - 15.5 %   Platelets 235 150 - 400 K/uL   nRBC 0.0 0.0 - 0.2 %    Comment: Performed at [facility], [place] [address] [place], [place] [address]  Basic metabolic panel  Status: Abnormal   Collection Time: 05/05/20  9:17 AM  Result Value Ref Range   Sodium 133 (L) 135 - 145 mmol/L   Potassium 3.3 (L) 3.5 - 5.1 mmol/L   Chloride 98 98 - 111 mmol/L   CO2 23 22 - 32 mmol/L   Glucose, Bld 97 70 - 99 mg/dL    Comment: Glucose reference range applies only to samples taken after fasting for at least 8 hours.   BUN 9 6 - 20 mg/dL   Creatinine, Ser 0.72 0.44 - 1.00 mg/dL   Calcium 8.3 (L) 8.9 - 10.3 mg/dL   GFR calc non Af Amer >60 >60 mL/min   Anion gap 12 5 - 15    Comment: Performed at [facility], [place] [address], [place], [place] [address]  Aerobic/Anaerobic Culture (surgical/deep wound)     Status: None (Preliminary result)   Collection Time: 05/05/20  5:51 PM   Specimen: Joint, Other; Body Fluid  Result Value Ref Range   Specimen Description      SYNOVIAL RIGHT  WRIST Performed at [facility] [address] [place], [place] [address]    Special Requests      NONE Performed at [facility], [place] [address] [place], [place] [address]    Gram Stain      RARE WBC PRESENT, PREDOMINANTLY PMN NO ORGANISMS SEEN Performed at [facility], [place] [address], [place], [place] [address]    Culture PENDING    Report Status PENDING   Aerobic/Anaerobic Culture (surgical/deep wound)     Status: None (Preliminary result)   Collection Time: 05/05/20  5:53 PM   Specimen: Wound  Result Value Ref Range   Specimen Description      ABSCESS LEFT FOREARM 1 Performed at [place] [address], [place], [place] [address]    Special Requests      NONE Performed at [facility], [place] [address] [place] [address] [place], [place] [address]    Gram Stain      ABUNDANT WBC PRESENT, PREDOMINANTLY PMN ABUNDANT GRAM POSITIVE COCCI Performed at [facility], [place] [address], [place], [place] [address]    Culture PENDING    Report Status PENDING   Aerobic/Anaerobic Culture (surgical/deep wound)     Status: None (Preliminary result)   Collection Time: 05/05/20  6:04 PM   Specimen: Wound  Result Value Ref Range   Specimen Description      ABSCESS LEFT FOREARM 2 Performed at [facility] [address], [place], [place] [address]    Special Requests      NONE Performed at [facility], [place] [address] [place], [place] [address]    Gram Stain      ABUNDANT WBC PRESENT, PREDOMINANTLY PMN ABUNDANT GRAM POSITIVE COCCI Performed at [facility], [place] [address], [place], [place] [address]    Culture PENDING    Report Status PENDING   Basic metabolic panel     Status: Abnormal   Collection Time: 05/06/20  5:27 AM  Result Value Ref Range   Sodium 134 (L) 135 - 145 mmol/L   Potassium 3.6 3.5 - 5.1 mmol/L   Chloride 100 98 - 111 mmol/L   CO2 25 22  - 32 mmol/L   Glucose, Bld 90 70 - 99 mg/dL    Comment: Glucose reference range applies only to samples taken after fasting for at least 8 hours.   BUN  11 6 - 20 mg/dL   Creatinine, Ser 0.60 0.44 - 1.00 mg/dL   Calcium 8.1 (L) 8.9 - 10.3 mg/dL   GFR calc non Af Amer >60 >60 mL/min   Anion gap 9 5 - 15    Comment: Performed at [facility], [place] [address], [place], [place] [address]  CBC     Status: Abnormal   Collection Time: 05/06/20  5:27 AM  Result Value Ref Range   WBC 19.5 (H) 4.0 - 10.5 K/uL   RBC 3.32 (L) 3.87 - 5.11 MIL/uL   Hemoglobin 9.2 (L) 12.0 - 15.0 g/dL   HCT 28.8 (L) 36 - 46 %   MCV 86.7 80.0 - 100.0 fL   MCH 27.7 26.0 - 34.0 pg   MCHC 31.9 30.0 - 36.0 g/dL   RDW 14.7 11.5 - 15.5 %   Platelets 245 150 - 400 K/uL   nRBC 0.0 0.0 - 0.2 %    Comment: Performed at [facility], [place] [address], [place], [place] [address]  Sedimentation rate     Status: Abnormal   Collection Time: 05/06/20  5:27 AM  Result Value Ref Range   Sed Rate 105 (H) 0 - 22 mm/hr    Comment: Performed at [facility], [place] [address], [place], [place] [address]  C-reactive protein     Status: Abnormal   Collection Time: 05/06/20  5:27 AM  Result Value Ref Range   CRP 24.7 (H) <1.0 mg/dL    Comment: Performed at [facility], [place] [address], [place], [place] [address]  Magnesium     Status: None   Collection Time: 05/06/20  5:27 AM  Result Value Ref Range   Magnesium 2.2 1.7 - 2.4 mg/dL    Comment: Performed at [facility], [place] [address], [place], [place] [address]  Phosphorus     Status: None   Collection Time: 05/06/20  5:27 AM  Result Value Ref Range   Phosphorus 3.4 2.5 - 4.6 mg/dL    Comment: Performed at [facility], [place] [address] [place], [place] [address]    MICRO: reviewed IMAGING: DG Wrist Complete Right  Result Date: 05/04/2020 CLINICAL DATA:  Pain EXAM:  RIGHT WRIST - COMPLETE 3+ VIEW COMPARISON:  None. FINDINGS: There is no evidence of fracture or dislocation. There is no evidence of arthropathy or other focal bone abnormality. Soft tissues are unremarkable. IMPRESSION: Negative. Electronically Signed   By: Constance Holster M.D.   On: 05/04/2020 23:05   DG Foot Complete Right  Result Date: 05/04/2020 CLINICAL DATA:  Pain and swelling EXAM: RIGHT FOOT COMPLETE - 3+ VIEW COMPARISON:  None. FINDINGS: There is no evidence of fracture or dislocation. There are degenerative changes of the midfoot. There is a large plantar calcaneal spur. There is soft tissue swelling about the foot. IMPRESSION: Soft tissue swelling without evidence of an acute fracture or dislocation. Electronically Signed   By: Constance Holster M.D.   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 Continue on vancomycin.  Will follow culture to see if further surgery needs to be done vs. Medical management with IV vancomycin for now. She maybe candidate for oritavancin. If blood cx are positive, then will need to do endocarditis work up.  Elzie Rings [facility] [phone]

## 2020-05-07 LAB — BASIC METABOLIC PANEL
Anion gap: 7 (ref 5–15)
BUN: 14 mg/dL (ref 6–20)
CO2: 25 mmol/L (ref 22–32)
Calcium: 7.8 mg/dL — ABNORMAL LOW (ref 8.9–10.3)
Chloride: 102 mmol/L (ref 98–111)
Creatinine, Ser: 1.03 mg/dL — ABNORMAL HIGH (ref 0.44–1.00)
GFR calc non Af Amer: >60 mL/min (ref >60)
Glucose, Bld: 97 mg/dL (ref 70–99)
Potassium: 3.5 mmol/L (ref 3.5–5.1)
Sodium: 134 mmol/L — ABNORMAL LOW (ref 135–145)

## 2020-05-07 LAB — SEDIMENTATION RATE: Sed Rate: 107 mm/hr — ABNORMAL HIGH (ref 0–22)

## 2020-05-07 LAB — CBC
HCT: 27.3 % — ABNORMAL LOW (ref 36.0–46.0)
Hemoglobin: 8.7 g/dL — ABNORMAL LOW (ref 12.0–15.0)
MCH: 27.9 pg (ref 26.0–34.0)
MCHC: 31.9 g/dL (ref 30.0–36.0)
MCV: 87.5 fL (ref 80.0–100.0)
Platelets: 278 10*3/uL (ref 150–400)
RBC: 3.12 MIL/uL — ABNORMAL LOW (ref 3.87–5.11)
RDW: 15.2 % (ref 11.5–15.5)
WBC: 17 10*3/uL — ABNORMAL HIGH (ref 4.0–10.5)
nRBC: 0 % (ref 0.0–0.2)

## 2020-05-07 LAB — C-REACTIVE PROTEIN: CRP: 22 mg/dL — ABNORMAL HIGH (ref <1.0)

## 2020-05-07 NOTE — Progress Notes (Signed)
Patient signed out AMA. Writer educated patient on risks and seriousness of sepsis. Patient stated she will be back. Writer removed patient IV catheter intact. Provider has been made aware.

## 2020-05-07 NOTE — Anesthesia Postprocedure Evaluation (Signed)
Anesthesia Post Note  Patient: Marie Brewer  Procedure(s) Performed: left forearm incision and drainage and aspiration of the right wrist joint (Bilateral Arm Lower)     Patient location during evaluation: PACU Anesthesia Type: General Level of consciousness: awake and alert Pain management: pain level controlled Vital Signs Assessment: post-procedure vital signs reviewed and stable Respiratory status: spontaneous breathing, nonlabored ventilation, respiratory function stable and patient connected to nasal cannula oxygen Cardiovascular status: blood pressure returned to baseline and stable Postop Assessment: no apparent nausea or vomiting Anesthetic complications: no   No complications documented.               Effie Berkshire

## 2020-05-07 NOTE — Progress Notes (Signed)
2 Days Post-Op  Subjective: Patient is a 39 year old female who underwent right wrist radiocarpal joint aspiration and incision and drainage of left forearm abscesses on 05/05/2020 with Dr. Claudia Desanctis.  Today she is sleeping in bed; easily aroused.  Reports her right wrist still hurts and pain prevents her from moving it.  There is edema of the right hand, wrist, and forearm.  Normal capillary refill, palpable radial pulse, sensation intact. Unable to assess ROM as patient will not attempt to move fingers/hand/wrist as she says it's too painful. Left arm normal capillary refill, palpable radial pulse, motor and sensation intact. Left arm incisions (2 lower arm, 2 upper arm) all have continued induration. Erythema improved some, lower arm incisions > upper arm incisions. Small amount of purulent drainage visible of superior incision on lower arm. Patient denies F/C, N/V, CP, SOB.    Objective: Vital signs in last 24 hours: Temp:  [98.7 F (37.1 C)-99.8 F (37.7 C)] 99.2 F (37.3 C) (10/08 1359) Pulse Rate:  [102-109] 102 (10/08 1359) Resp:  [19-23] 21 (10/08 1800) BP: (145-151)/(79-83) 148/79 (10/08 1359) SpO2:  [95 %-98 %] 98 % (10/08 1359) Last BM Date:  (unknown)  Intake/Output from previous day: 10/07 0701 - 10/08 0700 In: 1251.9 [P.O.:960; IV Piggyback:291.9] Out: -  Intake/Output this shift: Total I/O In: -  Out: 525 [Urine:525]  General appearance: alert, cooperative and no distress Head: Normocephalic, without obvious abnormality, atraumatic Eyes: EOMs intact Resp: nonlabored Extremities: Right hand, wrist, forearm have edema present.  Palpable radial pulse, normal capillary refill, sensation intact.  Patient does not attempt to move fingers or wrist as she reports it is too painful.  Left arm incisions all have iodoform packing gauze in place.  Induration still present.  Erythema has improved some; lower arm incisions > upper arm incisions.  Some purulent drainage is visible from the  superior lower arm incision.  Left arm radial pulse is palpable, normal capillary refill, motor and sensation intact.       Lab Results:  CBC    Component Value Date/Time   WBC 17.0 (H) 05/07/2020 0432   RBC 3.12 (L) 05/07/2020 0432   HGB 8.7 (L) 05/07/2020 0432   HCT 27.3 (L) 05/07/2020 0432   PLT 278 05/07/2020 0432   MCV 87.5 05/07/2020 0432   MCH 27.9 05/07/2020 0432   MCHC 31.9 05/07/2020 0432   RDW 15.2 05/07/2020 0432   LYMPHSABS 0.7 05/04/2020 2338   MONOABS 0.8 05/04/2020 2338   EOSABS 0.0 05/04/2020 2338   BASOSABS 0.0 05/04/2020 2338   BMET Recent Labs    05/06/20 0527 05/07/20 0432  NA 134* 134*  K 3.6 3.5  CL 100 102  CO2 25 25  GLUCOSE 90 97  BUN 11 14  CREATININE 0.60 1.03*  CALCIUM 8.1* 7.8*   PT/INR No results for input(s): LABPROT, INR in the last 72 hours. ABG No results for input(s): PHART, HCO3 in the last 72 hours.  Invalid input(s): PCO2, PO2  Studies/Results: No results found.  Anti-infectives: Anti-infectives (From admission, onward)   Start     Dose/Rate Route Frequency Ordered Stop   05/06/20 0500  vancomycin (VANCOREADY) IVPB 1500 mg/300 mL        1,500 mg 150 mL/hr over 120 Minutes Intravenous Every 12 hours 05/05/20 1624     05/05/20 2200  vancomycin (VANCOREADY) IVPB 1500 mg/300 mL  Status:  Discontinued        1,500 mg 150 mL/hr over 120 Minutes Intravenous Every 12 hours 05/05/20  0156 05/05/20 1624   05/05/20 1621  vancomycin (VANCOCIN) 1-5 GM/200ML-% IVPB       Note to Pharmacy: Charmayne Sheer   : cabinet override      05/05/20 1621 05/06/20 0429   05/05/20 0430  vancomycin (VANCOREADY) IVPB 2000 mg/400 mL        2,000 mg 200 mL/hr over 120 Minutes Intravenous  Once 05/05/20 0418 05/05/20 0701   05/05/20 0030  vancomycin (VANCOCIN) IVPB 1000 mg/200 mL premix        1,000 mg 200 mL/hr over 60 Minutes Intravenous  Once 05/05/20 0027 05/05/20 0241      Assessment/Plan: s/p Procedure(s): left forearm incision and  drainage and aspiration of the right wrist joint  Overall patient appears to be improving.  Right wrist still has swelling and pain present.  Wrist aspiration culture NGTD.  Left arm incision and drainage sites are improving and show less erythema.  Wound cultures from lower arm incision sites growing a group a strep.  Per infectious disease, will continue vancomycin with plans to treat for at least 10 to 14 days.   Continue daily dressing changes of left arm incision sites consisting of packing with iodoform packing gauze (quarter inch), and covering with 4 x 4 gauze, Kerlix, Ace wrap.  Recommend elevating right hand/wrist as much as possible.   LOS: 1 day    Threasa Heads, PA-C 05/07/2020

## 2020-05-07 NOTE — Progress Notes (Signed)
    [facility] for Infectious Disease    Date of Admission:  05/04/2020   Total days of antibiotics 4          ID: Marie Brewer is a 39 y.o. female with multiple skin lesions/abscess and cellulitis involving 4 extremities Principal Problem:   Sepsis due to cellulitis [facility]) Active Problems:   Polysubstance abuse ([place])   Hypokalemia   Hyponatremia   Sepsis (HCC)    Subjective: Improvement in erythema to her legs, still tender/swollen right wrist  Micro cx showing group a strep - left forearm abscess cx. Wrist cx still NGTD and blood cx still NGTD  Medications:  . magnesium oxide  400 mg Oral BID  . sodium chloride flush  3 mL Intravenous Q12H    Objective: Vital signs in last 24 hours: Temp:  [98.7 F (37.1 C)-99.8 F (37.7 C)] 99.2 F (37.3 C) (10/08 1359) Pulse Rate:  [102-109] 102 (10/08 1359) Resp:  [20-23] 23 (10/08 1359) BP: (145-151)/(79-83) 148/79 (10/08 1359) SpO2:  [95 %-98 %] 98 % (10/08 1359) Physical Exam  Constitutional:  oriented to person, place, and time. appears well-developed and well-nourished. No distress.  HENT: [place]/AT, PERRLA, no scleral icterus Mouth/Throat: Oropharynx is clear and moist. No oropharyngeal exudate.  Cardiovascular: Normal rate, regular rhythm and normal heart sounds. Exam reveals no gallop and no friction rub.  No murmur heard.  Pulmonary/Chest: Effort normal and breath sounds normal. No respiratory distress.  has no wheezes.  Neck = supple, no nuchal rigidity Abdominal: Soft. Bowel sounds are normal.  exhibits no distension. There is no tenderness.  Lymphadenopathy: no cervical adenopathy. No axillary adenopathy Neurological: alert and oriented to person, place, and time.  Skin: Skin is warm and dry. Scattered eschar to legs. Swelling is decreased in lower legs. Right hand swelling and erythema persists Psychiatric: a normal mood and affect.  behavior is normal.    Lab Results Recent Labs    05/06/20 0527 05/07/20 0432    WBC 19.5* 17.0*  HGB 9.2* 8.7*  HCT 28.8* 27.3*  NA 134* 134*  K 3.6 3.5  CL 100 102  CO2 25 25  BUN 11 14  CREATININE 0.60 1.03*   Sedimentation Rate Recent Labs    05/07/20 0432  ESRSEDRATE 107*   C-Reactive Protein Recent Labs    05/06/20 0527 05/07/20 0432  CRP 24.7* 22.0*    Microbiology:  Studies/Results: No results found.   Assessment/Plan: Group a strep cellulitis and deep tissue infection s/p I x D of left forearm = continue on vancomycin (she has anaphylaxis to PCN) so will keep on vancomycin. She appears improved from yesterday except for her right hand.  Recommend to treat for at least 10-14 days given burden of infection. May need to still evaluate her right wrist, not completely convinced that she doesn't have septic arthritis.  Can likely discharge on linezolid or give iv dose of oritavancin.  Dr Linus Salmons available for questions over the weekend. Dr Gale Journey to see on Monday   [facility] for Infectious Diseases Cell: [phone] Pager: [phone]  05/07/2020, 4:39 PM

## 2020-05-07 NOTE — Progress Notes (Signed)
PROGRESS NOTE    Marie Brewer  RSW:[id number] DOB: Feb 16, 1981 DOA: 05/04/2020 PCP: Pcp, No   Chief Complaint  Patient presents with  . Wrist Pain    Brief Narrative: 39 y.o. female with medical history significant for cervical cancer treated with chemoradiation 10 years ago, polysubstance abuse, and now presenting to the emergency department for evaluation of severe right wrist pain.  Patient reports longstanding erythema and ulcerations involving the bilateral lower legs, notes some more recent redness and swelling of the left forearm, but is primarily concerned with severe right wrist pain.  Patient reports that she put her weight on her right hand last night, heard a "pop," and has been experiencing severe pain since.  She has difficulty moving the wrist due to severe pain.  She has not noticed any drainage from the right wrist but reports occasional purulent drainage from her lower leg wounds.  She eventually acknowledges IV drug use in the ED.  History and exam are somewhat limited by the patient's agitation and poor cooperation.  ED Course: Upon arrival to the ED, patient is found to be afebrile, saturating well on room air, tachycardic to 130, and with stable blood pressure.  Radiographs of the right foot demonstrate soft tissue swelling without acute fracture or dislocation.  Radiographs of the right wrist are negative.  Chemistry panel is notable for sodium of 131 and potassium 3.4.  CBC features a normocytic anemia and leukocytosis to 16,400.  ESR is elevated to 96, CRP elevated to 23, and lactic acid is reassuringly normal.  UDS is positive for amphetamines and opiates.  Covid screening test not yet performed.  Blood cultures were collected in the ED, 1 L of saline was administered, and the patient was treated with vancomycin in addition to analgesics, Ativan, and droperidol. 10/6: status post right wrist joint aspiration and left forearm abscess drainage x2 in the OR by Dr.  Claudia Desanctis  Subjective: Afebrile overnight.  Vitals are stable Patient improving , Sleepy but able to wake up and interact.  Still complains of pain on the left forearm and right wrist which are also swollen.  Assessment & Plan:  Sepsis due cellulitis and abscess: Abscess on the left forearm and swelling of the right wrist and right hand ?septic arthritis: s/p right wrist joint aspiration and left forearm abscess drainage x2 on 10/6, left forearm wound culture growing gram-positive cocci, rt wrist aspiration no growth blood culture no growth so far.  ID on board, appreciate input.  Continued on vancomycin. Continue pain control Tylenol, Toradol and oxycodone.  Creatinine slightly up.  Monitor renal function closely.  Cellulitis vs chronic wound [id number] to vital signs above.  Patient reports lower limb redness and swelling is much better than last week.  Polysubstance abuse/IVDA, UDS positive for amphetamines, opiates.  Patient is not forthcoming about drug use.Reports she was bitten by an insect  In her forearm. She denies any IV drug use.  She reports she was using ecstasy/molly and other stuffs.Continue as needed Ativan, TOC consulted to help with outpatient resources and rehabilitation..  Hypokalemia: Resolved  Hyponatremia, mild.  Improving.  Anemia likely from anemia of chronic disease and inflammation.  Monitor hemoglobin.  Morbid Obesity with BMI 50.12 kg/m: will benefit with weight loss and healthy lifestyle.  I have had extensive discussion with her about her current infection and also adverse effects of drug abuse.  I requested her to stay in the hospital and completed treatment and not to leave [place] which could  lead into worsening of infection sepsis significant disability including death. She verbalized understanding and agrees with current recommendation. Nutrition: Diet Order            Diet regular Room service appropriate? Yes; Fluid consistency: Thin   Diet effective now               DVT prophylaxis: SCDs Start: 05/05/20 0426 Code Status:   Code Status: Full Code Family Communication: plan of care discussed with patient at bedside.  Status is: Inpatient Patient remains inpatient for further ongoing management of her cellulitis w/ iv antibiotics  dispo: The patient is from: Home              Anticipated d/c is to: Home              Anticipated d/c date is:2 days              Patient currently is not medically stable to d/c. Consultants:see note  Procedures:see note  Culture/Microbiology    Component Value Date/Time   SDES  05/05/2020 1804    ABSCESS LEFT FOREARM 2 Performed at [facility], [place] [address] [place], [place] [address]    SPECREQUEST  05/05/2020 1804    NONE Performed at [facility], [place] [address] [place], [place] [address]    CULT PENDING 05/05/2020 1804   REPTSTATUS PENDING 05/05/2020 1804    Other culture-see note  Medications: Scheduled Meds: . magnesium oxide  400 mg Oral BID  . sodium chloride flush  3 mL Intravenous Q12H   Continuous Infusions: . vancomycin 1,500 mg (05/07/20 0414)    Antimicrobials: Anti-infectives (From admission, onward)   Start     Dose/Rate Route Frequency Ordered Stop   05/06/20 0500  vancomycin (VANCOREADY) IVPB 1500 mg/300 mL        1,500 mg 150 mL/hr over 120 Minutes Intravenous Every 12 hours 05/05/20 1624     05/05/20 2200  vancomycin (VANCOREADY) IVPB 1500 mg/300 mL  Status:  Discontinued        1,500 mg 150 mL/hr over 120 Minutes Intravenous Every 12 hours 05/05/20 0429 05/05/20 1624   05/05/20 1621  vancomycin (VANCOCIN) 1-5 GM/200ML-% IVPB       Note to Pharmacy: Charmayne Sheer   : cabinet override      05/05/20 1621 05/06/20 0429   05/05/20 0430  vancomycin (VANCOREADY) IVPB 2000 mg/400 mL        2,000 mg 200 mL/hr over 120 Minutes Intravenous  Once 05/05/20 0418 05/05/20 0701   05/05/20 0030  vancomycin  (VANCOCIN) IVPB 1000 mg/200 mL premix        1,000 mg 200 mL/hr over 60 Minutes Intravenous  Once 05/05/20 0027 05/05/20 0241     Objective: Vitals: Today's Vitals   05/07/20 0412 05/07/20 0500 05/07/20 0635 05/07/20 0742  BP:   (!) 145/82   Pulse:   (!) 104   Resp:   20   Temp:   99.8 F (37.7 C)   TempSrc:   Oral   SpO2:   95%   Weight:      Height:      PainSc: 8  Asleep  8     Intake/Output Summary (Last 24 hours) at 05/07/2020 0822 Last data filed at 05/07/2020 0414 Gross per 24 hour  Intake 1251.87 ml  Output --  Net 1251.87 ml   Filed Weights   05/05/20 0949  Weight: (!) 145.2 kg   Weight change:  Intake/Output from previous day: 10/07 0701 - 10/08 0700 In: 1251.9 [P.O.:960; IV Piggyback:291.9] Out: -  Intake/Output this shift: No intake/output data recorded.  Examination: General exam: AAOx3, obese , NAD, weak appearing. HEENT:Oral mucosa moist, Ear/Nose WNL grossly, dentition normal. Respiratory system: bilaterally clear,no wheezing or crackles,no use of accessory muscle Cardiovascular system: S1 & S2 +, No JVD,. Gastrointestinal system: Abdomen soft, NT,ND, BS+ Nervous System:Alert, awake, moving extremities and grossly nonfocal Extremities: Left forearm with swelling and dressing intact, right wrist swollen, unable to fully close the right fist but she reports this is improving Skin: No rashes,no icterus. MSK: Normal muscle bulk,tone, power  Data Reviewed: I have personally reviewed following labs and imaging studies CBC: Recent Labs  Lab 05/04/20 2338 05/05/20 0917 05/06/20 0527 05/07/20 0432  WBC 16.4* 21.4* 19.5* 17.0*  NEUTROABS 14.7*  --   --   --   HGB 10.5* 9.7* 9.2* 8.7*  HCT 31.9* 29.2* 28.8* 27.3*  MCV 85.3 83.7 86.7 87.5  PLT 262 235 245 127   Basic Metabolic Panel: Recent Labs  Lab 05/04/20 2338 05/05/20 0143 05/05/20 0917 05/06/20 0527 05/07/20 0432  NA 131*  --  133* 134* 134*  K 3.1*  --  3.3* 3.6 3.5  CL 96*  --  98  100 102  CO2 25  --  _0 GLUCOSE 104*  --  97 90 97  BUN 11  --  _1 CREATININE 0.75  --  0.72 0.60 1.03*  CALCIUM 8.5*  --  8.3* 8.1* 7.8*  MG  --  1.5*  --  2.2  --   PHOS  --   --   --  3.4  --    GFR: Estimated Creatinine Clearance: 108.9 mL/min (A) (by C-G formula based on SCr of 1.03 mg/dL (H)). Liver Function Tests: No results for input(s): AST, ALT, ALKPHOS, BILITOT, PROT, ALBUMIN in the last 168 hours. No results for input(s): LIPASE, AMYLASE in the last 168 hours. No results for input(s): AMMONIA in the last 168 hours. Coagulation Profile: No results for input(s): INR, PROTIME in the last 168 hours. Cardiac Enzymes: No results for input(s): CKTOTAL, CKMB, CKMBINDEX, TROPONINI in the last 168 hours. BNP (last 3 results) No results for input(s): PROBNP in the last 8760 hours. HbA1C: No results for input(s): HGBA1C in the last 72 hours. CBG: No results for input(s): GLUCAP in the last 168 hours. Lipid Profile: No results for input(s): CHOL, HDL, LDLCALC, TRIG, CHOLHDL, LDLDIRECT in the last 72 hours. Thyroid Function Tests: No results for input(s): TSH, T4TOTAL, FREET4, T3FREE, THYROIDAB in the last 72 hours. Anemia Panel: No results for input(s): VITAMINB12, FOLATE, FERRITIN, TIBC, IRON, RETICCTPCT in the last 72 hours. Sepsis Labs: Recent Labs  Lab 05/05/20 0143  LATICACIDVEN 1.3    Recent Results (from the past 240 hour(s))  Blood culture (routine x 2)     Status: None (Preliminary result)   Collection Time: 05/05/20  1:43 AM   Specimen: BLOOD  Result Value Ref Range Status   Specimen Description   Final    BLOOD BLOOD LEFT WRIST Performed at [facility] [address] [place], [place] [address]    Special Requests   Final    BOTTLES DRAWN AEROBIC AND ANAEROBIC Blood Culture adequate volume Performed at [facility] [address] [place], [place] [address]    Culture   Final    NO GROWTH 2  DAYS Performed at [facility], [place] [address] [place], [place] [address]    Report Status PENDING  Incomplete  Respiratory Panel by RT PCR (Flu A&B, Covid) - Nasopharyngeal Swab     Status: None   Collection Time: 05/05/20  2:57 AM   Specimen: Nasopharyngeal Swab  Result Value Ref Range Status   SARS Coronavirus 2 by RT PCR NEGATIVE NEGATIVE Final    Comment: (NOTE) SARS-CoV-2 target nucleic acids are NOT DETECTED.  The SARS-CoV-2 RNA is generally detectable in upper respiratoy specimens during the acute phase of infection. The lowest concentration of SARS-CoV-2 viral copies this assay can detect is 131 copies/mL. A negative result does not preclude SARS-Cov-2 infection and should not be used as the sole basis for treatment or other patient management decisions. A negative result may occur with  improper specimen collection/handling, submission of specimen other than nasopharyngeal swab, presence of viral mutation(s) within the areas targeted by this assay, and inadequate number of viral copies (<131 copies/mL). A negative result must be combined with clinical observations, patient history, and epidemiological information. The expected result is Negative.  Fact Sheet for Patients:  PinkCheek.be  Fact Sheet for Healthcare Providers:  GravelBags.it  This test is no t yet approved or cleared by the [place] FDA and  has been authorized for detection and/or diagnosis of SARS-CoV-2 by FDA under an Emergency Use Authorization (EUA). This EUA will remain  in effect (meaning this test can be used) for the duration of the COVID-19 declaration under Section 564(b)(1) of the Act, 21 U.S.C. section 360bbb-3(b)(1), unless the authorization is terminated or revoked sooner.     Influenza A by PCR NEGATIVE NEGATIVE Final   Influenza B by PCR NEGATIVE NEGATIVE Final    Comment: (NOTE) The Xpert Xpress SARS-CoV-2/FLU/RSV  assay is intended as an aid in  the diagnosis of influenza from Nasopharyngeal swab specimens and  should not be used as a sole basis for treatment. Nasal washings and  aspirates are unacceptable for Xpert Xpress SARS-CoV-2/FLU/RSV  testing.  Fact Sheet for Patients: PinkCheek.be  Fact Sheet for Healthcare Providers: GravelBags.it  This test is not yet approved or cleared by the [place] FDA and  has been authorized for detection and/or diagnosis of SARS-CoV-2 by  FDA under an Emergency Use Authorization (EUA). This EUA will remain  in effect (meaning this test can be used) for the duration of the  Covid-19 declaration under Section 564(b)(1) of the Act, 21  U.S.C. section 360bbb-3(b)(1), unless the authorization is  terminated or revoked. Performed at [facility], [place] [address] [place], [place] [address]   Blood culture (routine x 2)     Status: None (Preliminary result)   Collection Time: 05/05/20  9:17 AM   Specimen: BLOOD  Result Value Ref Range Status   Specimen Description   Final    BLOOD BLOOD RIGHT FOREARM Performed at [facility] [address] [place], [place] [address]    Special Requests   Final    BOTTLES DRAWN AEROBIC AND ANAEROBIC Blood Culture adequate volume Performed at [facility] [address], [place], [place] [address]    Culture   Final    NO GROWTH 2 DAYS Performed at [facility] [address], [place], [place] [address]    Report Status PENDING  Incomplete  Aerobic/Anaerobic Culture (surgical/deep wound)     Status: None (Preliminary result)   Collection Time: 05/05/20  5:51 PM   Specimen: Joint, Other; Body  Fluid  Result Value Ref Range Status   Specimen Description   Final    SYNOVIAL RIGHT WRIST Performed at [facility] [address] [place], [place] [address]    Special Requests    Final    NONE Performed at [facility], [place] [address] [place], [place] [address]    Gram Stain   Final    RARE WBC PRESENT, PREDOMINANTLY PMN NO ORGANISMS SEEN Performed at [facility], [place] [address] [place], [place] [address]    Culture PENDING  Incomplete   Report Status PENDING  Incomplete  Aerobic/Anaerobic Culture (surgical/deep wound)     Status: None (Preliminary result)   Collection Time: 05/05/20  5:53 PM   Specimen: Wound  Result Value Ref Range Status   Specimen Description   Final    ABSCESS LEFT FOREARM 1 Performed at [facility] [address] [place], [place] [address]    Special Requests   Final    NONE Performed at [facility], [place] [address] [place], [place] [address]    Gram Stain   Final    ABUNDANT WBC PRESENT, PREDOMINANTLY PMN ABUNDANT GRAM POSITIVE COCCI Performed at [facility], [place] [address], [place], [place] [address]    Culture PENDING  Incomplete   Report Status PENDING  Incomplete  Aerobic/Anaerobic Culture (surgical/deep wound)     Status: None (Preliminary result)   Collection Time: 05/05/20  6:04 PM   Specimen: Wound  Result Value Ref Range Status   Specimen Description   Final    ABSCESS LEFT FOREARM 2 Performed at [facility] [address], [place], [place] [address]    Special Requests   Final    NONE Performed at [facility], [place] [address], [place], [place] [address]    Gram Stain   Final    ABUNDANT WBC PRESENT, PREDOMINANTLY PMN ABUNDANT GRAM POSITIVE COCCI Performed at [facility], [place] [address] [place],  [address]    Culture PENDING  Incomplete   Report Status PENDING  Incomplete     Radiology Studies: No results found.   LOS: 1 day   Antonieta Pert, MD [facility]  05/07/2020, 8:22 AM

## 2020-05-08 ENCOUNTER — Encounter (HOSPITAL_COMMUNITY): Payer: Self-pay | Admitting: *Deleted

## 2020-05-08 ENCOUNTER — Inpatient Hospital Stay (HOSPITAL_COMMUNITY)
Admission: EM | Admit: 2020-05-08 | Discharge: 2020-05-10 | DRG: 603 | Discharge status: Against Medical Advice | Payer: Self-pay | Attending: Internal Medicine | Admitting: Internal Medicine

## 2020-05-08 ENCOUNTER — Other Ambulatory Visit: Payer: Self-pay

## 2020-05-08 DIAGNOSIS — E876 Hypokalemia: Secondary | ICD-10-CM | POA: Diagnosis present

## 2020-05-08 DIAGNOSIS — F1910 Other psychoactive substance abuse, uncomplicated: Secondary | ICD-10-CM | POA: Diagnosis present

## 2020-05-08 DIAGNOSIS — R600 Localized edema: Secondary | ICD-10-CM | POA: Diagnosis present

## 2020-05-08 DIAGNOSIS — L02414 Cutaneous abscess of left upper limb: Principal | ICD-10-CM | POA: Diagnosis present

## 2020-05-08 DIAGNOSIS — R609 Edema, unspecified: Secondary | ICD-10-CM

## 2020-05-08 DIAGNOSIS — Z6841 Body Mass Index (BMI) 40.0 and over, adult: Secondary | ICD-10-CM

## 2020-05-08 DIAGNOSIS — Z923 Personal history of irradiation: Secondary | ICD-10-CM

## 2020-05-08 DIAGNOSIS — D638 Anemia in other chronic diseases classified elsewhere: Secondary | ICD-10-CM | POA: Diagnosis present

## 2020-05-08 DIAGNOSIS — Z20822 Contact with and (suspected) exposure to covid-19: Secondary | ICD-10-CM | POA: Diagnosis present

## 2020-05-08 DIAGNOSIS — L03114 Cellulitis of left upper limb: Secondary | ICD-10-CM | POA: Diagnosis present

## 2020-05-08 DIAGNOSIS — L03115 Cellulitis of right lower limb: Secondary | ICD-10-CM | POA: Diagnosis present

## 2020-05-08 DIAGNOSIS — Z5329 Procedure and treatment not carried out because of patient's decision for other reasons: Secondary | ICD-10-CM | POA: Diagnosis present

## 2020-05-08 DIAGNOSIS — B950 Streptococcus, group A, as the cause of diseases classified elsewhere: Secondary | ICD-10-CM | POA: Diagnosis present

## 2020-05-08 DIAGNOSIS — Z880 Allergy status to penicillin: Secondary | ICD-10-CM

## 2020-05-08 DIAGNOSIS — Z881 Allergy status to other antibiotic agents status: Secondary | ICD-10-CM

## 2020-05-08 DIAGNOSIS — Z9221 Personal history of antineoplastic chemotherapy: Secondary | ICD-10-CM

## 2020-05-08 DIAGNOSIS — L0390 Cellulitis, unspecified: Secondary | ICD-10-CM | POA: Diagnosis present

## 2020-05-08 DIAGNOSIS — M25431 Effusion, right wrist: Secondary | ICD-10-CM | POA: Diagnosis present

## 2020-05-08 DIAGNOSIS — F17200 Nicotine dependence, unspecified, uncomplicated: Secondary | ICD-10-CM | POA: Diagnosis present

## 2020-05-08 DIAGNOSIS — Z8541 Personal history of malignant neoplasm of cervix uteri: Secondary | ICD-10-CM

## 2020-05-08 DIAGNOSIS — N179 Acute kidney failure, unspecified: Secondary | ICD-10-CM | POA: Diagnosis present

## 2020-05-08 DIAGNOSIS — L03116 Cellulitis of left lower limb: Secondary | ICD-10-CM | POA: Diagnosis present

## 2020-05-08 LAB — BASIC METABOLIC PANEL
Anion gap: 13 (ref 5–15)
BUN: 14 mg/dL (ref 6–20)
CO2: 24 mmol/L (ref 22–32)
Calcium: 8.1 mg/dL — ABNORMAL LOW (ref 8.9–10.3)
Chloride: 100 mmol/L (ref 98–111)
Creatinine, Ser: 1.31 mg/dL — ABNORMAL HIGH (ref 0.44–1.00)
GFR, Estimated: 51 mL/min — ABNORMAL LOW (ref >60)
Glucose, Bld: 123 mg/dL — ABNORMAL HIGH (ref 70–99)
Potassium: 2.8 mmol/L — ABNORMAL LOW (ref 3.5–5.1)
Sodium: 137 mmol/L (ref 135–145)

## 2020-05-08 LAB — CBC WITH DIFFERENTIAL/PLATELET
Abs Immature Granulocytes: 0.67 10*3/uL — ABNORMAL HIGH (ref 0.00–0.07)
Basophils Absolute: 0.1 10*3/uL (ref 0.0–0.1)
Basophils Relative: 0 %
Eosinophils Absolute: 0.2 10*3/uL (ref 0.0–0.5)
Eosinophils Relative: 1 %
HCT: 28.1 % — ABNORMAL LOW (ref 36.0–46.0)
Hemoglobin: 8.9 g/dL — ABNORMAL LOW (ref 12.0–15.0)
Immature Granulocytes: 4 %
Lymphocytes Relative: 15 %
Lymphs Abs: 2.4 10*3/uL (ref 0.7–4.0)
MCH: 27.4 pg (ref 26.0–34.0)
MCHC: 31.7 g/dL (ref 30.0–36.0)
MCV: 86.5 fL (ref 80.0–100.0)
Monocytes Absolute: 1.1 10*3/uL — ABNORMAL HIGH (ref 0.1–1.0)
Monocytes Relative: 7 %
Neutro Abs: 11.2 10*3/uL — ABNORMAL HIGH (ref 1.7–7.7)
Neutrophils Relative %: 73 %
Platelets: 324 10*3/uL (ref 150–400)
RBC: 3.25 MIL/uL — ABNORMAL LOW (ref 3.87–5.11)
RDW: 15.5 % (ref 11.5–15.5)
WBC: 15.6 10*3/uL — ABNORMAL HIGH (ref 4.0–10.5)
nRBC: 0 % (ref 0.0–0.2)

## 2020-05-08 LAB — RESPIRATORY PANEL BY RT PCR (FLU A&B, COVID)
Influenza A by PCR: NEGATIVE
Influenza B by PCR: NEGATIVE
SARS Coronavirus 2 by RT PCR: NEGATIVE

## 2020-05-08 LAB — LACTIC ACID, PLASMA: Lactic Acid, Venous: 2.1 mmol/L (ref 0.5–1.9)

## 2020-05-08 MED ORDER — POTASSIUM CHLORIDE CRYS ER 20 MEQ PO TBCR
40.0000 meq | EXTENDED_RELEASE_TABLET | Freq: Once | ORAL | Status: AC
  Administered 2020-05-08: 40 meq via ORAL
  Filled 2020-05-08: qty 2

## 2020-05-08 MED ORDER — LINEZOLID 600 MG/300ML IV SOLN
600.0000 mg | Freq: Two times a day (BID) | INTRAVENOUS | Status: DC
  Filled 2020-05-08: qty 300

## 2020-05-08 MED ORDER — VANCOMYCIN HCL 1500 MG/300ML IV SOLN
1500.0000 mg | Freq: Two times a day (BID) | INTRAVENOUS | Status: DC
  Administered 2020-05-08 – 2020-05-09 (×2): 1500 mg via INTRAVENOUS
  Filled 2020-05-08 (×2): qty 300

## 2020-05-08 MED ORDER — OXYCODONE-ACETAMINOPHEN 5-325 MG PO TABS
1.0000 | ORAL_TABLET | ORAL | Status: DC | PRN
  Administered 2020-05-09 (×2): 2 via ORAL
  Administered 2020-05-09: 1 via ORAL
  Administered 2020-05-10 (×3): 2 via ORAL
  Filled 2020-05-08: qty 2
  Filled 2020-05-08: qty 1
  Filled 2020-05-08 (×4): qty 2

## 2020-05-08 MED ORDER — POTASSIUM CHLORIDE 10 MEQ/100ML IV SOLN
10.0000 meq | INTRAVENOUS | Status: AC
  Administered 2020-05-08 – 2020-05-09 (×2): 10 meq via INTRAVENOUS
  Filled 2020-05-08 (×2): qty 100

## 2020-05-08 NOTE — H&P (Signed)
Marie Brewer GYF:[id number] DOB: [birth year] DOA: 05/08/2020     PCP: Merryl Hacker, No   Outpatient Specialists:  NONE    Patient arrived to ER on 05/08/20 at [facility] Referred by Attending Dorie Rank, MD   Patient coming from: home Lives With family    Chief Complaint:   Chief Complaint  Patient presents with  . Cellulitis    HPI: Marie Brewer is a 39 y.o. female with medical history significant of cervical cancer treated with chemoradiation 10 years ago, polysubstance abuse, morbid obesity    Presented with  LEft leg swelling She has chronic wounds of her lower extremities Right foot is persistently swollen reports no worse than during recent admission Plain imaging at [facility] at time did not show any osteo She just left hospital AMA yesterday and now returns to continue her care Was seen on 10/5 in ER for severe Right wrist pain  longstanding erythema and ulcerations involving the bilateral lower legs, notes some more recent redness and swelling of the left forearm, but is primarily concerned with severe right wrist pain. Diminished wrist motion She continues to use IV drugs Was non cooperative in ER initially abut at some point admitted IVDU Reports she was bitten by an insect  In her forearm. undergone Wrist aspiration done on 10/6 status post right wrist joint aspiration and left forearm abscess drainage x2 in the OR by Dr. Claudia Desanctis Plain films were unremarkable WBC up to 16, ESR is elevated to 96, CRP elevated to 23,  She was diagnosed to have Sepsis UDS is positive for amphetamines and opiates Id was consulted started on vancomycin, ID rec "She maybe candidate for oritavancin. If blood cx are positive, then will need to do endocarditis work up"  patient unfortunately left AMA on 10/9 at 10:30Pm stating she had to take care of sick family member, Pt reports she has Autistic son who needed help.   Her Left forearm  cultures were positive for strep pyogenes. Blood culture still no  growth  Infectious risk factors:  Reports none   Has  NOt been vaccinated against COVID ( would be interested)   Initial COVID TEST   in house  PCR testing  Pending  Lab Results  Component Value Date   [place] NEGATIVE 05/05/2020    Regarding pertinent Chronic problems:      Morbid obesity-   BMI Readings from Last 1 Encounters:  05/08/20 50.12 kg/m      Chronic anemia - baseline hg Hemoglobin & Hematocrit  Recent Labs    05/06/20 0527 05/07/20 0432 05/08/20 1909  HGB 9.2* 8.7* 8.9*    While in ER: WBC up to 15.6 K 2.8 CR up from baseline lactic acid now 2.1  Vanc was restarted    Hospitalist was called for admission for Cellulitis, hypokalemia and AKI  The following Work up has been ordered so far:  Orders Placed This Encounter  Procedures  . Respiratory Panel by RT PCR (Flu A&B, Covid) - Nasopharyngeal Swab  . CBC with Differential/Platelet  . Basic metabolic panel  . Rapid urine drug screen (hospital performed)  . Lactic acid, plasma  . vancomycin per pharmacy consult  . Consult to hospitalist  ALL PATIENTS BEING ADMITTED/HAVING PROCEDURES NEED COVID-19 SCREENING  . Saline lock IV    Following Medications were ordered in ER: Medications  vancomycin (VANCOREADY) IVPB 1500 mg/300 mL (1,500 mg Intravenous New Bag/Given 05/08/20 2029)  potassium chloride SA (KLOR-CON) CR tablet 40 mEq (40 mEq Oral Given 05/08/20  2114)        Consult Orders  (From admission, onward)         Start     Ordered   05/08/20 2100  Consult to hospitalist  ALL PATIENTS BEING ADMITTED/HAVING PROCEDURES NEED COVID-19 SCREENING  Once       Comments: ALL PATIENTS BEING ADMITTED/HAVING PROCEDURES NEED COVID-19 SCREENING  Provider:  (Not yet assigned)  Question Answer Comment  Place call to: [facility]   Reason for Consult Admit      05/08/20 2059          Significant initial  Findings: Abnormal Labs Reviewed  CBC WITH DIFFERENTIAL/PLATELET - Abnormal;  Notable for the following components:      Result Value   WBC 15.6 (*)    RBC 3.25 (*)    Hemoglobin 8.9 (*)    HCT 28.1 (*)    Neutro Abs 11.2 (*)    Monocytes Absolute 1.1 (*)    Abs Immature Granulocytes 0.67 (*)    All other components within normal limits  BASIC METABOLIC PANEL - Abnormal; Notable for the following components:   Potassium 2.8 (*)    Glucose, Bld 123 (*)    Creatinine, Ser 1.31 (*)    Calcium 8.1 (*)    GFR, Estimated 51 (*)    All other components within normal limits  LACTIC ACID, PLASMA - Abnormal; Notable for the following components:   Lactic Acid, Venous 2.1 (*)    All other components within normal limits    Otherwise labs showing:    Recent Labs  Lab 05/04/20 2338 05/05/20 0143 05/05/20 0917 05/06/20 0527 05/07/20 0432 05/08/20 1909  NA 131*  --  133* 134* 134* 137  K 3.1*  --  3.3* 3.6 3.5 2.8*  CO2 25  --  _0 GLUCOSE 104*  --  97 90 97 123*  BUN 11  --  _1 CREATININE 0.75  --  0.72 0.60 1.03* 1.31*  CALCIUM 8.5*  --  8.3* 8.1* 7.8* 8.1*  MG  --  1.5*  --  2.2  --   --   PHOS  --   --   --  3.4  --   --     Cr   Up from baseline see below Lab Results  Component Value Date   CREATININE 1.31 (H) 05/08/2020   CREATININE 1.03 (H) 05/07/2020   CREATININE 0.60 05/06/2020    No results for input(s): AST, ALT, ALKPHOS, BILITOT, PROT, ALBUMIN in the last 168 hours. Lab Results  Component Value Date   CALCIUM 8.1 (L) 05/08/2020   PHOS 3.4 05/06/2020   WBC        Component Value Date/Time   WBC 15.6 (H) 05/08/2020 1909   LYMPHSABS 2.4 05/08/2020 1909   MONOABS 1.1 (H) 05/08/2020 1909   EOSABS 0.2 05/08/2020 1909   BASOSABS 0.1 05/08/2020 1909    Plt: Lab Results  Component Value Date   PLT 324 05/08/2020    Lactic Acid, Venous    Component Value Date/Time   LATICACIDVEN 2.1 (HH) 05/08/2020 1941     Procalcitonin   Ordered   COVID-19 Labs  Recent Labs    05/06/20 0527 05/07/20 0432  CRP 24.7*  22.0*    Lab Results  Component Value Date   SARSCOV2NAA NEGATIVE 05/05/2020    HG/HCT  stable,       Component Value Date/Time   HGB 8.9 (L) 05/08/2020 1909  HCT 28.1 (L) 05/08/2020 1909   MCV 86.5 05/08/2020 1909    ECG: Ordered     UA   ordered   Urine analysis:    Component Value Date/Time   COLORURINE YELLOW 06/06/2018 1421   APPEARANCEUR HAZY (A) 06/06/2018 1421   LABSPEC 1.020 06/06/2018 1421   PHURINE 5.0 06/06/2018 1421   GLUCOSEU NEGATIVE 06/06/2018 1421   HGBUR MODERATE (A) 06/06/2018 1421   BILIRUBINUR NEGATIVE 06/06/2018 1421   KETONESUR NEGATIVE 06/06/2018 1421   PROTEINUR NEGATIVE 06/06/2018 1421   UROBILINOGEN 0.2 10/09/2008 1457   NITRITE NEGATIVE 06/06/2018 1421   LEUKOCYTESUR MODERATE (A) 06/06/2018 1421      Ordered   ED Triage Vitals  Enc Vitals Group     BP 05/08/20 1849 (!) 191/125     Pulse Rate 05/08/20 1849 (!) 106     Resp 05/08/20 1849 18     Temp 05/08/20 1849 98 F (36.7 C)     Temp Source 05/08/20 1849 Oral     SpO2 05/08/20 1849 98 %     Weight 05/08/20 1852 (!) 320 lb (145.1 kg)     Height 05/08/20 1852 _0  (1.702 m)     Head Circumference --      Peak Flow --      Pain Score 05/08/20 1852 7     Pain Loc --      Pain Edu? --      Excl. in [place]? --   TMAX(24)@       Latest  Blood pressure (!) 146/98, pulse (!) 106, temperature 98 F (36.7 C), temperature source Oral, resp. rate (!) 21, height _1  (1.702 m), weight (!) 145.1 kg, SpO2 96 %.    Review of Systems:    Pertinent positives include:  Fatigue, Right wrist and left foot pain   Constitutional:  No weight loss, night sweats, Fevers, chills, , weight loss  HEENT:  No headaches, Difficulty swallowing,Tooth/dental problems,Sore throat,  No sneezing, itching, ear ache, nasal congestion, post nasal drip,  Cardio-vascular:  No chest pain, Orthopnea, PND, anasarca, dizziness, palpitations.no Bilateral lower extremity swelling  GI:  No heartburn, indigestion,  abdominal pain, nausea, vomiting, diarrhea, change in bowel habits, loss of appetite, melena, blood in stool, hematemesis Resp:  no shortness of breath at rest. No dyspnea on exertion, No excess mucus, no productive cough, No non-productive cough, No coughing up of blood.No change in color of mucus.No wheezing. Skin:  no rash or lesions. No jaundice GU:  no dysuria, change in color of urine, no urgency or frequency. No straining to urinate.  No flank pain.  Musculoskeletal:  No joint pain or no joint swelling. No decreased range of motion. No back pain.  Psych:  No change in mood or affect. No depression or anxiety. No memory loss.  Neuro: no localizing neurological complaints, no tingling, no weakness, no double vision, no gait abnormality, no slurred speech, no confusion  All systems reviewed and apart from [place] all are negative  Past Medical History:   Past Medical History:  Diagnosis Date  . History of cervical cancer   . Migraine headache      Past Surgical History:  Procedure Laterality Date  . IRRIGATION AND DEBRIDEMENT ABSCESS Bilateral 05/05/2020   Procedure: left forearm incision and drainage and aspiration of the right wrist joint;  Surgeon: Cindra Presume, MD;  Location: [facility];  Service: Plastics;  Laterality: Bilateral;  . MANDIBLE FRACTURE SURGERY      Social History:  Ambulatory   Independently     reports that she has been smoking. She has never used smokeless tobacco. She reports that she does not drink alcohol and does not use drugs.   Family History:   Family History  Problem Relation Age of Onset  . Autism spectrum disorder Other     Allergies: Allergies  Allergen Reactions  . Amoxicillin-Pot Clavulanate Anaphylaxis  . Imitrex [Sumatriptan Base] Other (See Comments)    Heart races  . Cephalexin Rash     Prior to Admission medications   Medication Sig Start Date End Date Taking? Authorizing Provider  ibuprofen (ADVIL,MOTRIN) 200 MG tablet  Take 400 mg by mouth every 6 (six) hours as needed. For pain   Yes [provider]  ciprofloxacin (CIPRO) 500 MG tablet Take 1 tablet (500 mg total) by mouth every 12 (twelve) hours. Patient not taking: Reported on 05/05/2020 06/06/18   Little, Wenda Overland, MD  nitrofurantoin, macrocrystal-monohydrate, (MACROBID) 100 MG capsule Take 1 capsule (100 mg total) by mouth 2 (two) times daily. Patient not taking: Reported on 05/05/2020 11/13/17   Delia Heady, PA-C  silver sulfADIAZINE (SILVADENE) 1 % cream Apply 1 application topically daily. Thin layer after washing tid Patient not taking: Reported on 05/05/2020 06/11/15   Billy Fischer, MD   Physical Exam: Vitals with BMI 05/08/2020 05/08/2020 05/08/2020  Height - - -  Weight - - -  BMI - - -  Systolic 834 196 222  Diastolic 98 95 87  Pulse 979 25 92    1. General:  in No  Acute distress    Chronically ill  -appearing 2. Psychological: Alert and   Oriented 3. Head/ENT:   Dry Mucous Membranes                          Head Non traumatic, neck supple                           Poor Dentition 4. SKIN:  decreased Skin turgor,  Skin clean Dry Multiple ulcerations on bilateral legs Right foot red and swollen, LEft forearm with multiple wounds with drains in place  5. Heart: Regular rate and rhythm no Murmur, no Rub or gallop 6. Lungs:  Clear to auscultation bilaterally, no wheezes or crackles   7. Abdomen: Soft, non-tender, Non distended  Obese bowel sounds present 8. Lower extremities: no clubbing, cyanosis,  Edema Right >left 9. Neurologically Grossly intact, moving all 4 extremities equally  10. MSK: Normal range of motion   All other LABS:     Recent Labs  Lab 05/04/20 2338 05/05/20 0917 05/06/20 0527 05/07/20 0432 05/08/20 1909  WBC 16.4* 21.4* 19.5* 17.0* 15.6*  NEUTROABS 14.7*  --   --   --  11.2*  HGB 10.5* 9.7* 9.2* 8.7* 8.9*  HCT 31.9* 29.2* 28.8* 27.3* 28.1*  MCV 85.3 83.7 86.7 87.5 86.5  PLT 262 235 245 278 324       Recent Labs  Lab 05/04/20 2338 05/05/20 0143 05/05/20 0917 05/06/20 0527 05/07/20 0432 05/08/20 1909  NA 131*  --  133* 134* 134* 137  K 3.1*  --  3.3* 3.6 3.5 2.8*  CL 96*  --  98 100 102 100  CO2 25  --  _0 GLUCOSE 104*  --  97 90 97 123*  BUN 11  --  _1 CREATININE 0.75  --  0.72 0.60 1.03* 1.31*  CALCIUM 8.5*  --  8.3* 8.1* 7.8* 8.1*  MG  --  1.5*  --  2.2  --   --   PHOS  --   --   --  3.4  --   --      No results for input(s): AST, ALT, ALKPHOS, BILITOT, PROT, ALBUMIN in the last 168 hours.     Cultures:    Component Value Date/Time   SDES  05/05/2020 1804    ABSCESS LEFT FOREARM 2 Performed at [facility], [place] [address] [place], [place] [address]    SPECREQUEST  05/05/2020 1804    NONE Performed at [facility], [place] [address], [place], [place] [address]    CULT  05/05/2020 1804    RARE GROUP A STREP (S.PYOGENES) ISOLATED Beta hemolytic streptococci are predictably susceptible to penicillin and other beta lactams. Susceptibility testing not routinely performed. NO ANAEROBES ISOLATED; CULTURE IN PROGRESS FOR 5 DAYS    REPTSTATUS PENDING 05/05/2020 1804     Radiological Exams on Admission: No results found.  Chart has been reviewed    Assessment/Plan  39 y.o. female with medical history significant of cervical cancer treated with chemoradiation 10 years ago, polysubstance abuse, morbid obesity   Admitted for cellulitis, aki, hypokalemia  Present on Admission: . Cellulitis - of multiple sites -admit per cellulitis protocol will      Discussed with ID will change to Linezolid      plain films 10/5 showed:  no evidence of osteomyelitis  no    foreign   Objects. Pt deneis swelling getting any worse       Will obtain MRSA screening,       obtain blood cultures if febrile or septic     further antibiotic adjustment pending above results  Wound culture from left forearm grew strep  pyogenes  . Polysubstance abuse ([place]) - repeat Urine tox as pt left Hospital , transition care consult  . Hypokalemia - - will replace and repeat in AM,  check magnesium level and replace as needed  . Edema of right foot - did not improve as per pt unchanged Will obtain dopplers. Given persistent sed rate and CRP elevation would consider MRI or further imaging, can benefit from Orthopedics consult if doppler negative for DVT  . AKI (acute kidney injury) ([place]) - obtain urine electrolytes and rehydrate Check CK  Multiple wounds - will need wound care consult  Other plan as per orders.  DVT prophylaxis:  SCD      Code Status:    Code Status: Prior FULL CODE   as per patient   I had personally discussed CODE STATUS with patient     Family Communication:   Family not at  Bedside   Disposition Plan:     To home once workup is complete and patient is stable   Following barriers for discharge:                            Electrolytes corrected                               Anemia stable                             Pain controlled with PO medications  Will need to be able to tolerate PO                            Will likely need home health, home O2, set up                           Will need consultants to evaluate patient prior to discharge                                        Transition of care consulted                 Consults called: ID is aware  Admission status:  ED Disposition    ED Disposition Condition [place]: [facility] [[id number]]  Level of Care: Telemetry [5]  Admit to tele based on following criteria: Other see comments  Comments: hypokalemia  May admit patient to [facility] or [facility] if equivalent level of care is available:: No  Covid Evaluation: Confirmed COVID Negative  Diagnosis: Cellulitis [381829]  Admitting Physician: Toy Baker [[id number]]   Attending Physician: Toy Baker [[id number]]  Estimated length of stay: past midnight tomorrow  Certification:: I certify this patient will need inpatient services for at least 2 midnights         inpatient     I Expect 2 midnight stay secondary to severity of patient's current illness need for inpatient interventions justified by the following:    Severe lab/radiological/exam abnormalities including:    hypokalemia and extensive comorbidities including:  substance abuse  .    That are currently affecting medical management.   I expect  patient to be hospitalized for 2 midnights requiring inpatient medical care.  Patient is at high risk for adverse outcome (such as loss of life or disability) if not treated.  Indication for inpatient stay as follows:    Need for operative/procedural  intervention    Need for    , IV fluids,    Level of care    tele  For 12H     Lab Results  Component Value Date   [place] 05/08/2020     Precautions: admitted as covid negative  No active isolations    PPE: Used by the provider:   P100  eye Goggles,  Gloves  gown     Xandria Gallaga 05/08/2020, 10:59 PM    [facility]     after 2 AM please page floor coverage PA If 7AM-7PM, please contact the day team taking care of the patient using [email or web address]   Patient was evaluated in the context of the global COVID-19 pandemic, which necessitated consideration that the patient might be at risk for infection with the SARS-CoV-2 virus that causes COVID-19. Institutional protocols and algorithms that pertain to the evaluation of patients at risk for COVID-19 are in a state of rapid change based on information released by regulatory bodies including the CDC and federal and state organizations. These policies and algorithms were followed during the patient's care.

## 2020-05-08 NOTE — ED Provider Notes (Signed)
[facility] DEPT Provider Note   CSN: [id number] Arrival date & time: 05/08/20  1834     History Chief Complaint  Patient presents with  . Cellulitis    Marie Brewer is a 39 y.o. female.  HPI    Patient presents to the ED to be readmitted for cellulitis and abscess treatment.  Patient was initially admitted to the hospital on October 5.  Patient presented with complaints of right wrist pain as well as swelling and pain in the left forearm.  Patient also had chronic wounds of her lower extremities.  Patient admitted to IV drug use.  There was concerns for possible sepsis as well as methamphetamine use.  Patient was admitted to the hospital and started on IV antibiotics.  on the sixth she underwent left forearm incision and drainage and right radiocarpal joint aspiration.  Last evening at about 10:30 PM the patient ended up signing out AMA.  Patient states she had to leave to take care of a sick family member.  Patient returns to the ED now to continue her treatment.  She denies any fevers since discharge Past Medical History:  Diagnosis Date  . History of cervical cancer   . Migraine headache     Patient Active Problem List   Diagnosis Date Noted  . Sepsis ([place]) 05/06/2020  . Sepsis due to cellulitis ([place]) 05/05/2020  . Polysubstance abuse ([place]) 05/05/2020  . Hypokalemia 05/05/2020  . Hyponatremia 05/05/2020  . Cervical ca ([place]) 07/30/2013    Past Surgical History:  Procedure Laterality Date  . IRRIGATION AND DEBRIDEMENT ABSCESS Bilateral 05/05/2020   Procedure: left forearm incision and drainage and aspiration of the right wrist joint;  Surgeon: Cindra Presume, MD;  Location: [facility];  Service: Plastics;  Laterality: Bilateral;  . MANDIBLE FRACTURE SURGERY       OB History   No obstetric history on file.     No family history on file.  Social History   Tobacco Use  . Smoking status: Current Every Day Smoker  . Smokeless tobacco: Never  Used  Substance Use Topics  . Alcohol use: No  . Drug use: No    Home Medications Prior to Admission medications   Medication Sig Start Date End Date Taking? Authorizing Provider  ibuprofen (ADVIL,MOTRIN) 200 MG tablet Take 400 mg by mouth every 6 (six) hours as needed. For pain   Yes [provider]  ciprofloxacin (CIPRO) 500 MG tablet Take 1 tablet (500 mg total) by mouth every 12 (twelve) hours. Patient not taking: Reported on 05/05/2020 06/06/18   Little, Wenda Overland, MD  nitrofurantoin, macrocrystal-monohydrate, (MACROBID) 100 MG capsule Take 1 capsule (100 mg total) by mouth 2 (two) times daily. Patient not taking: Reported on 05/05/2020 11/13/17   Delia Heady, PA-C  silver sulfADIAZINE (SILVADENE) 1 % cream Apply 1 application topically daily. Thin layer after washing tid Patient not taking: Reported on 05/05/2020 06/11/15   Billy Fischer, MD    Allergies    Amoxicillin-pot clavulanate, Imitrex [sumatriptan base], and Cephalexin  Review of Systems   Review of Systems  All other systems reviewed and are negative.   Physical Exam Updated Vital Signs BP (!) 137/95   Pulse (!) 25   Temp 98 F (36.7 C) (Oral)   Resp 20   Ht 1.702 m (5\' 7" )   Wt (!) 145.1 kg   SpO2 98%   BMI 50.12 kg/m   Physical Exam Vitals and nursing note reviewed.  Constitutional:  Appearance: She is well-developed. She is not toxic-appearing or diaphoretic.  HENT:     Head: Normocephalic and atraumatic.     Right Ear: External ear normal.     Left Ear: External ear normal.  Eyes:     General: No scleral icterus.       Right eye: No discharge.        Left eye: No discharge.     Conjunctiva/sclera: Conjunctivae normal.  Neck:     Trachea: No tracheal deviation.  Cardiovascular:     Rate and Rhythm: Regular rhythm. Tachycardia present.  Pulmonary:     Effort: Pulmonary effort is normal. No respiratory distress.     Breath sounds: Normal breath sounds. No stridor. No wheezing or  rales.  Abdominal:     General: Bowel sounds are normal. There is no distension.     Palpations: Abdomen is soft.     Tenderness: There is no abdominal tenderness. There is no guarding or rebound.  Musculoskeletal:        General: Tenderness present.     Cervical back: Neck supple.     Comments: Left forearm with dressing tact, edema noted right wrist, ulcerations of skin bilateral lower extremities  Skin:    General: Skin is warm and dry.     Findings: No rash.  Neurological:     Mental Status: She is alert.     Cranial Nerves: No cranial nerve deficit (no facial droop, extraocular movements intact, no slurred speech).     Sensory: No sensory deficit.     Motor: No abnormal muscle tone or seizure activity.     Coordination: Coordination normal.     ED Results / Procedures / Treatments   Labs (all labs ordered are listed, but only abnormal results are displayed) Labs Reviewed  CBC WITH DIFFERENTIAL/PLATELET - Abnormal; Notable for the following components:      Result Value   WBC 15.6 (*)    RBC 3.25 (*)    Hemoglobin 8.9 (*)    HCT 28.1 (*)    Neutro Abs 11.2 (*)    Monocytes Absolute 1.1 (*)    Abs Immature Granulocytes 0.67 (*)    All other components within normal limits  BASIC METABOLIC PANEL - Abnormal; Notable for the following components:   Potassium 2.8 (*)    Glucose, Bld 123 (*)    Creatinine, Ser 1.31 (*)    Calcium 8.1 (*)    GFR, Estimated 51 (*)    All other components within normal limits  LACTIC ACID, PLASMA - Abnormal; Notable for the following components:   Lactic Acid, Venous 2.1 (*)    All other components within normal limits  RESPIRATORY PANEL BY RT PCR (FLU A&B, COVID)  RAPID URINE DRUG SCREEN, HOSP PERFORMED    EKG None  Radiology No results found.  Procedures Procedures (including critical care time)  Medications Ordered in ED Medications  vancomycin (VANCOREADY) IVPB 1500 mg/300 mL (1,500 mg Intravenous New Bag/Given 05/08/20 2029)   potassium chloride SA (KLOR-CON) CR tablet 40 mEq (40 mEq Oral Given 05/08/20 2114)    ED Course  I have reviewed the triage vital signs and the nursing notes.  Pertinent labs & imaging results that were available during my care of the patient were reviewed by me and considered in my medical decision making (see chart for details).  Clinical Course as of May 08 2118  Sat May 08, 2020  1925 Previous records reviewed.  Patient had been on vancomycin  prior to leaving AMA.  I have ordered additional dose of vancomycin.  Patient's blood pressure is notably elevated.  Will recheck   [JK]  2058 Labs reviewed.  Patient has a persistent leukocytosis.   [JK]  2058 Hemoglobin is stable.   [JK]  2058 Potassium level decreased   [JK]    Clinical Course User Index [JK] Dorie Rank, MD   MDM Rules/Calculators/A&P                          Patient presents back to the emergency room for continued treatment of her skin infections, cellulitis/abscess.  Patient was initially admitted on the fifth.  She left AMA last evening.  Patient returns today for continued treatment.  Patient is currently afebrile nontoxic.  She does have persistent leukocytosis.  She is hypokalemic but has been given oral potassium.  IV antibiotics have been restarted.  Yesterday her sed rate and C-reactive protein remain elevated.  I will contact the hospitalist for admission to continue her IV antibiotic treatment.  Patient was previously on vancomycin and I have reordered that this evening but her cultures were positive for strep pyogenes.  May be able to change to narrower spectrum abx. Final Clinical Impression(s) / ED Diagnoses Final diagnoses:  Cellulitis, unspecified cellulitis site  Hypokalemia      Dorie Rank, MD 05/08/20 2120

## 2020-05-08 NOTE — Progress Notes (Signed)
Pharmacy Antibiotic Note  Marie Brewer is a 39 y.o. female admitted on 05/08/2020 with cellulitis.  Pharmacy has been consulted for vanc dosing. Note patient left AMA 10/8 and returns 10/9 to continue vancomycin treatment that she was already started on. ID recommended 10-14 days of treatment in 10/8 note  Plan: Restart vanc 1500mg  IV q12 that patient was previously on yesterday Check trough at steady state  Height: 5\' 7"  (170.2 cm) Weight: (!) 145.1 kg (320 lb) IBW/kg (Calculated) : 61.6  Temp (24hrs), Avg:98.2 F (36.8 C), Min:98 F (36.7 C), Max:98.4 F (36.9 C)  Recent Labs  Lab 05/04/20 2338 05/05/20 0143 05/05/20 0917 05/06/20 0527 05/07/20 0432  WBC 16.4*  --  21.4* 19.5* 17.0*  CREATININE 0.75  --  0.72 0.60 1.03*  LATICACIDVEN  --  1.3  --   --   --     Estimated Creatinine Clearance: 108.9 mL/min (A) (by C-G formula based on SCr of 1.03 mg/dL (H)).    Allergies  Allergen Reactions  . Amoxicillin-Pot Clavulanate Anaphylaxis  . Imitrex [Sumatriptan Base] Other (See Comments)    Heart races  . Cephalexin Rash     Thank you for allowing pharmacy to be a part of this patient's care.  Kara Mead 05/08/2020 7:41 PM

## 2020-05-08 NOTE — ED Triage Notes (Signed)
Cellulitis on left lower leg, here last night and left, returned for treatment today. States it is in her wrist and ankle also

## 2020-05-09 ENCOUNTER — Inpatient Hospital Stay (HOSPITAL_COMMUNITY): Payer: Self-pay

## 2020-05-09 DIAGNOSIS — L03115 Cellulitis of right lower limb: Secondary | ICD-10-CM

## 2020-05-09 DIAGNOSIS — M7989 Other specified soft tissue disorders: Secondary | ICD-10-CM

## 2020-05-09 LAB — URINALYSIS, ROUTINE W REFLEX MICROSCOPIC
Bilirubin Urine: NEGATIVE
Glucose, UA: NEGATIVE mg/dL
Ketones, ur: NEGATIVE mg/dL
Leukocytes,Ua: NEGATIVE
Nitrite: NEGATIVE
Protein, ur: NEGATIVE mg/dL
Specific Gravity, Urine: 1.013 (ref 1.005–1.030)
pH: 5 (ref 5.0–8.0)

## 2020-05-09 LAB — COMPREHENSIVE METABOLIC PANEL
ALT: 12 U/L (ref 0–44)
AST: 17 U/L (ref 15–41)
Albumin: 2 g/dL — ABNORMAL LOW (ref 3.5–5.0)
Alkaline Phosphatase: 130 U/L — ABNORMAL HIGH (ref 38–126)
Anion gap: 9 (ref 5–15)
BUN: 12 mg/dL (ref 6–20)
CO2: 23 mmol/L (ref 22–32)
Calcium: 7.4 mg/dL — ABNORMAL LOW (ref 8.9–10.3)
Chloride: 100 mmol/L (ref 98–111)
Creatinine, Ser: 1.09 mg/dL — ABNORMAL HIGH (ref 0.44–1.00)
GFR, Estimated: >60 mL/min (ref >60)
Glucose, Bld: 98 mg/dL (ref 70–99)
Potassium: 3.1 mmol/L — ABNORMAL LOW (ref 3.5–5.1)
Sodium: 132 mmol/L — ABNORMAL LOW (ref 135–145)
Total Bilirubin: 0.7 mg/dL (ref 0.3–1.2)
Total Protein: 7.9 g/dL (ref 6.5–8.1)

## 2020-05-09 LAB — CBC WITH DIFFERENTIAL/PLATELET
Abs Immature Granulocytes: 0.61 10*3/uL — ABNORMAL HIGH (ref 0.00–0.07)
Basophils Absolute: 0.1 10*3/uL (ref 0.0–0.1)
Basophils Relative: 0 %
Eosinophils Absolute: 0.3 10*3/uL (ref 0.0–0.5)
Eosinophils Relative: 2 %
HCT: 26.6 % — ABNORMAL LOW (ref 36.0–46.0)
Hemoglobin: 8.4 g/dL — ABNORMAL LOW (ref 12.0–15.0)
Immature Granulocytes: 4 %
Lymphocytes Relative: 15 %
Lymphs Abs: 2.3 10*3/uL (ref 0.7–4.0)
MCH: 27.5 pg (ref 26.0–34.0)
MCHC: 31.6 g/dL (ref 30.0–36.0)
MCV: 86.9 fL (ref 80.0–100.0)
Monocytes Absolute: 1.2 10*3/uL — ABNORMAL HIGH (ref 0.1–1.0)
Monocytes Relative: 8 %
Neutro Abs: 11 10*3/uL — ABNORMAL HIGH (ref 1.7–7.7)
Neutrophils Relative %: 71 %
Platelets: 309 10*3/uL (ref 150–400)
RBC: 3.06 MIL/uL — ABNORMAL LOW (ref 3.87–5.11)
RDW: 15.4 % (ref 11.5–15.5)
WBC: 15.4 10*3/uL — ABNORMAL HIGH (ref 4.0–10.5)
nRBC: 0 % (ref 0.0–0.2)

## 2020-05-09 LAB — FOLATE: Folate: 11.9 ng/mL (ref >5.9)

## 2020-05-09 LAB — RAPID URINE DRUG SCREEN, HOSP PERFORMED
Amphetamines: POSITIVE — AB
Barbiturates: NOT DETECTED
Benzodiazepines: NOT DETECTED
Cocaine: NOT DETECTED
Opiates: NOT DETECTED
Tetrahydrocannabinol: NOT DETECTED

## 2020-05-09 LAB — OSMOLALITY, URINE: Osmolality, Ur: 405 mOsm/kg (ref 300–900)

## 2020-05-09 LAB — RETICULOCYTES
Immature Retic Fract: 29 % — ABNORMAL HIGH (ref 2.3–15.9)
RBC.: 3.04 MIL/uL — ABNORMAL LOW (ref 3.87–5.11)
Retic Count, Absolute: 42.3 10*3/uL (ref 19.0–186.0)
Retic Ct Pct: 1.4 % (ref 0.4–3.1)

## 2020-05-09 LAB — IRON AND TIBC
Iron: 25 ug/dL — ABNORMAL LOW (ref 28–170)
Saturation Ratios: 17 % (ref 10.4–31.8)
TIBC: 147 ug/dL — ABNORMAL LOW (ref 250–450)
UIBC: 122 ug/dL

## 2020-05-09 LAB — MAGNESIUM: Magnesium: 1.9 mg/dL (ref 1.7–2.4)

## 2020-05-09 LAB — MRSA PCR SCREENING: MRSA by PCR: NEGATIVE

## 2020-05-09 LAB — TSH: TSH: 3.236 u[IU]/mL (ref 0.350–4.500)

## 2020-05-09 LAB — VITAMIN B12: Vitamin B-12: 579 pg/mL (ref 180–914)

## 2020-05-09 LAB — SODIUM, URINE, RANDOM: Sodium, Ur: 71 mmol/L

## 2020-05-09 LAB — LACTIC ACID, PLASMA
Lactic Acid, Venous: 1.2 mmol/L (ref 0.5–1.9)
Lactic Acid, Venous: 0.9 mmol/L (ref 0.5–1.9)

## 2020-05-09 LAB — PHOSPHORUS: Phosphorus: 5.3 mg/dL — ABNORMAL HIGH (ref 2.5–4.6)

## 2020-05-09 LAB — FERRITIN: Ferritin: 417 ng/mL — ABNORMAL HIGH (ref 11–307)

## 2020-05-09 LAB — CREATININE, URINE, RANDOM: Creatinine, Urine: 59.9 mg/dL

## 2020-05-09 MED ORDER — ACETAMINOPHEN 650 MG RE SUPP: 650.0000 mg | Freq: Four times a day (QID) | RECTAL | Status: DC | PRN

## 2020-05-09 MED ORDER — LINEZOLID 600 MG PO TABS
600.0000 mg | ORAL_TABLET | Freq: Two times a day (BID) | ORAL | Status: DC
  Administered 2020-05-09 – 2020-05-10 (×4): 600 mg via ORAL
  Filled 2020-05-09 (×5): qty 1

## 2020-05-09 MED ORDER — ACETAMINOPHEN 325 MG PO TABS: 650.0000 mg | ORAL_TABLET | Freq: Four times a day (QID) | ORAL | Status: DC | PRN

## 2020-05-09 MED ORDER — SODIUM CHLORIDE 0.9 % IV SOLN
75.0000 mL/h | INTRAVENOUS | Status: AC
  Administered 2020-05-09 (×2): 75 mL/h via INTRAVENOUS

## 2020-05-09 NOTE — Consult Note (Signed)
[facility] for Infectious Disease       Reason for Consult: cellulitis, abscess    Referring Physician: Dr. Roel Cluck  Active Problems:   Polysubstance abuse ([place])   Hypokalemia   Cellulitis   Edema of right foot   AKI (acute kidney injury) ([place])   . linezolid  600 mg Oral Q12H    Recommendations: linezolid for 2 weeks I will arrange follow up in our clinic  I will sign off, call with questions  Assessment: She has known cellulitis and abscess s/p I and D by Dr. Claudia Desanctis and growth with GAS in 2 of the cultures.  Has a known allergy history.  Left AMA yesterday then came back overnight.    Antibiotics: linezolid  HPI: Marie Brewer is a 39 y.o. female with history of substance abuse, obesity who just left AMA with above infection.  Was on vancomycin and plan to continue with oral linezolid.  She was having right wrist pain but overal feels better.  No pus or signfiicant drainage from her wounds.  She did endorse IVDU.  WBC 15.4, afebrile.  No complaints to me.  Has lesions on her legs and left arm, which is wrapped. Drug screen positive for amphetamines.    Review of Systems:  Constitutional: negative for fevers and chills Gastrointestinal: negative for nausea and diarrhea All other systems reviewed and are negative    Past Medical History:  Diagnosis Date  . History of cervical cancer   . Migraine headache     Social History   Tobacco Use  . Smoking status: Current Every Day Smoker  . Smokeless tobacco: Never Used  Substance Use Topics  . Alcohol use: No  . Drug use: No    Family History  Problem Relation Age of Onset  . Autism spectrum disorder Other     Allergies  Allergen Reactions  . Amoxicillin-Pot Clavulanate Anaphylaxis  . Imitrex [Sumatriptan Base] Other (See Comments)    Heart races  . Cephalexin Rash    Physical Exam: Constitutional: in no apparent distress  Vitals:   05/09/20 1130 05/09/20 1230  BP: 130/77 (!) 140/91  Pulse: 75 89   Resp: 20 (!) 25  Temp:    SpO2: 100% 97%   EYES: anicteric Cardiovascular: Cor RRR Respiratory: clear; Skin: legs with cellulitis, no draiange; left arm wrapped Neuro:non-focal  Lab Results  Component Value Date   WBC 15.4 (H) 05/09/2020   HGB 8.4 (L) 05/09/2020   HCT 26.6 (L) 05/09/2020   MCV 86.9 05/09/2020   PLT 309 05/09/2020    Lab Results  Component Value Date   CREATININE 1.09 (H) 05/09/2020   BUN 12 05/09/2020   NA 132 (L) 05/09/2020   K 3.1 (L) 05/09/2020   CL 100 05/09/2020   CO2 23 05/09/2020    Lab Results  Component Value Date   ALT 12 05/09/2020   AST 17 05/09/2020   ALKPHOS 130 (H) 05/09/2020     Microbiology: Recent Results (from the past 240 hour(s))  Blood culture (routine x 2)     Status: None (Preliminary result)   Collection Time: 05/05/20  1:43 AM   Specimen: BLOOD  Result Value Ref Range Status   Specimen Description   Final    BLOOD BLOOD LEFT WRIST Performed at [facility], [place] [address], [place],  [address]    Special Requests   Final    BOTTLES DRAWN AEROBIC AND ANAEROBIC Blood Culture adequate volume Performed at [facility], [place] [address], [place], [place] [address]    Culture   Final    NO GROWTH 3 DAYS Performed at [facility], [place] [address] [place], [place] [address]    Report Status PENDING  Incomplete  Respiratory Panel by RT PCR (Flu A&B, Covid) - Nasopharyngeal Swab     Status: None   Collection Time: 05/05/20  2:57 AM   Specimen: Nasopharyngeal Swab  Result Value Ref Range Status   SARS Coronavirus 2 by RT PCR NEGATIVE NEGATIVE Final    Comment: (NOTE) SARS-CoV-2 target nucleic acids are NOT DETECTED.  The SARS-CoV-2 RNA is generally detectable in upper respiratoy specimens during the acute phase of infection. The lowest concentration of SARS-CoV-2 viral copies this assay can detect is 131 copies/mL. A negative result does not preclude SARS-Cov-2 infection  and should not be used as the sole basis for treatment or other patient management decisions. A negative result may occur with  improper specimen collection/handling, submission of specimen other than nasopharyngeal swab, presence of viral mutation(s) within the areas targeted by this assay, and inadequate number of viral copies (<131 copies/mL). A negative result must be combined with clinical observations, patient history, and epidemiological information. The expected result is Negative.  Fact Sheet for Patients:  PinkCheek.be  Fact Sheet for Healthcare Providers:  GravelBags.it  This test is no t yet approved or cleared by the [place] FDA and  has been authorized for detection and/or diagnosis of SARS-CoV-2 by FDA under an Emergency Use Authorization (EUA). This EUA will remain  in effect (meaning this test can be used) for the duration of the COVID-19 declaration under Section 564(b)(1) of the Act, 21 U.S.C. section 360bbb-3(b)(1), unless the authorization is terminated or revoked sooner.     Influenza A by PCR NEGATIVE NEGATIVE Final   Influenza B by PCR NEGATIVE NEGATIVE Final    Comment: (NOTE) The Xpert Xpress SARS-CoV-2/FLU/RSV assay is intended as an aid in  the diagnosis of influenza from Nasopharyngeal swab specimens and  should not be used as a sole basis for treatment. Nasal washings and  aspirates are unacceptable for Xpert Xpress SARS-CoV-2/FLU/RSV  testing.  Fact Sheet for Patients: PinkCheek.be  Fact Sheet for Healthcare Providers: GravelBags.it  This test is not yet approved or cleared by the [place] FDA and  has been authorized for detection and/or diagnosis of SARS-CoV-2 by  FDA under an Emergency Use Authorization (EUA). This EUA will remain  in effect (meaning this test can be used) for the duration of the  Covid-19 declaration  under Section 564(b)(1) of the Act, 21  U.S.C. section 360bbb-3(b)(1), unless the authorization is  terminated or revoked. Performed at [facility], [place] [address] [place], [place] [address]   Blood culture (routine x 2)     Status: None (Preliminary result)   Collection Time: 05/05/20  9:17 AM   Specimen: BLOOD  Result Value Ref Range Status   Specimen Description   Final    BLOOD BLOOD RIGHT FOREARM Performed at [facility] [address] [place], [place] [address]    Special Requests   Final    BOTTLES DRAWN AEROBIC AND ANAEROBIC Blood Culture adequate volume Performed at [facility] [address], [place], [place] [address]    Culture   Final    NO GROWTH 3 DAYS Performed at [facility], [place] [address], [place], [place] [address]    Report Status PENDING  Incomplete  Aerobic/Anaerobic Culture (surgical/deep wound)     Status: None (Preliminary result)   Collection Time: 05/05/20  5:51 PM   Specimen: Joint, Other; Body Fluid  Result Value Ref Range Status   Specimen Description   Final    SYNOVIAL RIGHT WRIST Performed at [facility] [address] [place], [place] [address]    Special Requests   Final    NONE Performed at [facility], [place] [address], [place], [place] [address]    Gram Stain   Final    RARE WBC PRESENT, PREDOMINANTLY PMN NO ORGANISMS SEEN    Culture   Final    NO GROWTH 3 DAYS NO ANAEROBES ISOLATED; CULTURE IN PROGRESS FOR 5 DAYS Performed at [facility] [address] [place], [place] [address]    Report Status PENDING  Incomplete  Aerobic/Anaerobic Culture (surgical/deep wound)     Status: None (Preliminary result)   Collection Time: 05/05/20  5:53 PM   Specimen: Wound  Result Value Ref Range Status   Specimen Description   Final    ABSCESS LEFT FOREARM 1 Performed at [place] [address],  [place], [place] [address]    Special Requests   Final    NONE Performed at [facility], [place] [address] [place], [place] [address]    Gram Stain   Final    ABUNDANT WBC PRESENT, PREDOMINANTLY PMN ABUNDANT GRAM POSITIVE COCCI Performed at [facility], [place] [address] [place], [place] [address]    Culture   Final    RARE GROUP A STREP (S.PYOGENES) ISOLATED Beta hemolytic streptococci are predictably susceptible to penicillin and other beta lactams. Susceptibility testing not routinely performed. NO ANAEROBES ISOLATED; CULTURE IN PROGRESS FOR 5 DAYS    Report Status PENDING  Incomplete  Aerobic/Anaerobic Culture (surgical/deep wound)     Status: None (Preliminary result)   Collection Time: 05/05/20  6:04 PM   Specimen: Wound  Result Value Ref Range Status   Specimen Description   Final    ABSCESS LEFT FOREARM 2 Performed at [place] [address] [place], [place] [address]    Special Requests   Final    NONE Performed at [facility], [place] [address] [place], [place] [address]    Gram Stain   Final    ABUNDANT WBC PRESENT, PREDOMINANTLY PMN ABUNDANT GRAM POSITIVE COCCI Performed at [facility], [place] [address] [place], [place] [address]    Culture   Final    RARE GROUP A STREP (S.PYOGENES) ISOLATED Beta hemolytic streptococci are predictably susceptible to penicillin and other beta lactams. Susceptibility testing not routinely performed. NO ANAEROBES ISOLATED; CULTURE IN PROGRESS FOR 5 DAYS    Report Status PENDING  Incomplete  Respiratory Panel by RT PCR (Flu A&B, Covid) - Nasopharyngeal Swab     Status: None   Collection Time: 05/08/20  7:09 PM   Specimen: Nasopharyngeal Swab  Result Value Ref Range Status   SARS Coronavirus 2 by RT PCR NEGATIVE NEGATIVE Final    Comment: (NOTE) SARS-CoV-2 target nucleic acids are NOT DETECTED.  The SARS-CoV-2 RNA is generally detectable in upper  respiratoy specimens during the acute phase of infection. The lowest concentration of SARS-CoV-2 viral copies this assay can detect is 131 copies/mL. A negative result does not preclude SARS-Cov-2 infection and should not be used as the sole basis for treatment or other patient management decisions. A negative  result may occur with  improper specimen collection/handling, submission of specimen other than nasopharyngeal swab, presence of viral mutation(s) within the areas targeted by this assay, and inadequate number of viral copies (<131 copies/mL). A negative result must be combined with clinical observations, patient history, and epidemiological information. The expected result is Negative.  Fact Sheet for Patients:  PinkCheek.be  Fact Sheet for Healthcare Providers:  GravelBags.it  This test is no t yet approved or cleared by the [place] FDA and  has been authorized for detection and/or diagnosis of SARS-CoV-2 by FDA under an Emergency Use Authorization (EUA). This EUA will remain  in effect (meaning this test can be used) for the duration of the COVID-19 declaration under Section 564(b)(1) of the Act, 21 U.S.C. section 360bbb-3(b)(1), unless the authorization is terminated or revoked sooner.     Influenza A by PCR NEGATIVE NEGATIVE Final   Influenza B by PCR NEGATIVE NEGATIVE Final    Comment: (NOTE) The Xpert Xpress SARS-CoV-2/FLU/RSV assay is intended as an aid in  the diagnosis of influenza from Nasopharyngeal swab specimens and  should not be used as a sole basis for treatment. Nasal washings and  aspirates are unacceptable for Xpert Xpress SARS-CoV-2/FLU/RSV  testing.  Fact Sheet for Patients: PinkCheek.be  Fact Sheet for Healthcare Providers: GravelBags.it  This test is not yet approved or cleared by the [place] FDA and  has been  authorized for detection and/or diagnosis of SARS-CoV-2 by  FDA under an Emergency Use Authorization (EUA). This EUA will remain  in effect (meaning this test can be used) for the duration of the  Covid-19 declaration under Section 564(b)(1) of the Act, 21  U.S.C. section 360bbb-3(b)(1), unless the authorization is  terminated or revoked. Performed at [facility], [place] [address], [place], [place] [address]   MRSA PCR Screening     Status: None   Collection Time: 05/09/20 12:07 AM   Specimen: Nasopharyngeal  Result Value Ref Range Status   MRSA by PCR NEGATIVE NEGATIVE Final    Comment:        The GeneXpert MRSA Assay (FDA approved for NASAL specimens only), is one component of a comprehensive MRSA colonization surveillance program. It is not intended to diagnose MRSA infection nor to guide or monitor treatment for MRSA infections. Performed at [facility], [place] [address], [place], [place] [address]     Graciano Batson W Brooklyn Alfredo, MD [facility] for Infectious Disease [facility] [email or web address] 05/09/2020, 1:16 PM

## 2020-05-09 NOTE — Progress Notes (Signed)
PROGRESS NOTE    Teren Franckowiak  ATF:[id number] DOB: 07/23/1981 DOA: 05/08/2020 PCP: Pcp, No   Chief Complaint  Patient presents with  . Cellulitis  Brief Narrative: 39 year old female with history of cervical cancer treated with chemoradiation 10 years ago, polysubstance abuse, morbid obesity was admitted on 10/5 for left arm swelling, right wrist swelling, lower extremity chronic cellulitis and wound, was seen by hand surgery underwent left forearm abscess drainage x2 and right wrist joint aspiration by Dr. Claudia Desanctis on 10/6, was seen by ID and was being treated with IV antibiotics, wound culture grew Streptococcus Pyogenes.But unfortunately patient left AGAINST MEDICAL ADVICE 05/07/2020 during the night. She returned to the ED 05/08/2020 to good treatment.  In the ED was hypokalemia, leukocytosis creatinine at baseline lactic acid 2.1.  Vancomycin was restarted and admission was requested for management of her cellulitis hypokalemia and AKI.  Subjective: She reports she had to leave the hospital to take care of family emergency. Afebrile overnight, hemodynamically stable. No new complaint this morning. Left forearm surgical site draining decreasing  Assessment & Plan:  Streptococcus pyogenes abscess of the left forearm status post drainage x2/cellulitis of the multiple sites lower extremities, right wrist swelling-joint aspiration on 10/6 no growth so far: Was seen by ID reports he left AMA.  ID was consulted and now changed to Zyvox. Continue wound care.  Blood culture from last admission no growth so far.  Blood culture sent again on admission 10/9.  Right wrist swelling s, x-ray wrist no acute finding, continue treatment as above.  Bilateral lower extremity chronic cellulitis/edema/chronic skin changes: Dopplers pending. ?imaging to further assess vs ortho eval. Rt foot xray soft tissues swelling-without evidence of acute infection or dislocation.Obtain b/l LE xray  tibia/fibula.  Polysubstance abuse/IV drug abuse but not forthcoming with her drug use.  UDS 10/9+ for amphetamine and on previous admission positive for opiates, amphetamines.  She again denies any drug use since leaving AMA.  Hypokalemia: Replete and monitor.  Anemia likely from chronic disease?: Hb overall stable.  Monitor, somewhat low iron studies with iron 25 but ferritin high.  Start iron supplementation upon discharge  AKI: Renal function is stable.  Morbid obesity with BMI 50: Will benefit with weight loss and healthy lifestyle.    We discussed about risk of leaving [place], she verbalized understanding and is willing to stay in this time.    Nutrition: Diet Order            Diet Heart Room service appropriate? Yes; Fluid consistency: Thin  Diet effective now                DVT prophylaxis: SCDs Start: 05/09/20 0007 Code Status:   Code Status: Full Code  Family Communication: plan of care discussed with patient at bedside.  Status is: Inpatient Remains inpatient appropriate because:IV treatments appropriate due to intensity of illness or inability to take PO, Inpatient level of care appropriate due to severity of illness and Ongoing management of infection  Dispo: The patient is from: Home              Anticipated d/c is to: Home              Anticipated d/c date is: 2 days              Patient currently is not medically stable to d/c. Consultants:see note  Procedures:see note  Culture/Microbiology    Component Value Date/Time   SDES  05/05/2020 1804  ABSCESS LEFT FOREARM 2 Performed at [place] [address], [place], [place] [address]    SPECREQUEST  05/05/2020 1804    NONE Performed at [facility], [place] [address], [place], [place] [address]    CULT  05/05/2020 1804    RARE GROUP A STREP (S.PYOGENES) ISOLATED Beta hemolytic streptococci are predictably susceptible to penicillin and other beta  lactams. Susceptibility testing not routinely performed. NO ANAEROBES ISOLATED; CULTURE IN PROGRESS FOR 5 DAYS    REPTSTATUS PENDING 05/05/2020 1804    Other culture-see note  Medications: Scheduled Meds: . linezolid  600 mg Oral Q12H   Continuous Infusions: . sodium chloride 75 mL/hr (05/09/20 0048)  . vancomycin Stopped (05/08/20 2307)    Antimicrobials: Anti-infectives (From admission, onward)   Start     Dose/Rate Route Frequency Ordered Stop   05/09/20 0800  linezolid (ZYVOX) IVPB 600 mg  Status:  Discontinued        600 mg 300 mL/hr over 60 Minutes Intravenous Every 12 hours 05/08/20 2212 05/09/20 0011   05/09/20 0015  linezolid (ZYVOX) tablet 600 mg        600 mg Oral Every 12 hours 05/09/20 0006     05/08/20 2000  vancomycin (VANCOREADY) IVPB 1500 mg/300 mL        1,500 mg 150 mL/hr over 120 Minutes Intravenous Every 12 hours 05/08/20 1943       Objective: Vitals: Today's Vitals   05/09/20 0530 05/09/20 0630 05/09/20 0700 05/09/20 0800  BP: 123/78 119/81 119/83 128/82  Pulse:  86 90 90  Resp: (!) 29 19 19 19   Temp:      TempSrc:      SpO2:  100% 99% 95%  Weight:      Height:      PainSc:       No intake or output data in the 24 hours ending 05/09/20 0846 Filed Weights   05/08/20 1852  Weight: (!) 145.1 kg   Weight change:   Intake/Output from previous day: No intake/output data recorded. Intake/Output this shift: No intake/output data recorded.  Examination: General exam: AAOx3, obese,NAD, weak appearing. HEENT:Oral mucosa moist, Ear/Nose WNL grossly,dentition normal. Respiratory system: bilaterally clear. Cardiovascular system: S1 & S2 +, regular, No JVD. Gastrointestinal system: Abdomen soft, NT,ND, BS+. Nervous System:Alert, awake, moving extremities and grossly also. Extremities: No edema, distal peripheral pulses palpable.  Skin: No rashes,no icterus. MSK: Normal muscle bulk,tone, power Left forearm incision drainage site Dressing, minimal  tenderness, essentially no tenderness.  See picture.  Bilateral lower extremities with chronic appearing edema swelling see picture. Rt wrist swelling mildly tender able to close fist some but not completely      Data Reviewed: I have personally reviewed following labs and imaging studies CBC: Recent Labs  Lab 05/04/20 2338 05/04/20 2338 05/05/20 0917 05/06/20 0527 05/07/20 0432 05/08/20 1909 05/09/20 0500  WBC 16.4*   < > 21.4* 19.5* 17.0* 15.6* 15.4*  NEUTROABS 14.7*  --   --   --   --  11.2* 11.0*  HGB 10.5*   < > 9.7* 9.2* 8.7* 8.9* 8.4*  HCT 31.9*   < > 29.2* 28.8* 27.3* 28.1* 26.6*  MCV 85.3   < > 83.7 86.7 87.5 86.5 86.9  PLT 262   < > 235 245 278 324 309   < > = values in this interval not displayed.   Basic Metabolic Panel: Recent Labs  Lab 05/04/20 2338 05/05/20 0143 05/05/20 1093 05/06/20 0527 05/07/20 2355  05/08/20 1909 05/09/20 0500  NA   < >  --  133* 134* 134* 137 132*  K   < >  --  3.3* 3.6 3.5 2.8* 3.1*  CL   < >  --  98 100 102 100 100  CO2   < >  --  23 25 25 24 23   GLUCOSE   < >  --  97 90 97 123* 98  BUN   < >  --  9 11 14 14 12   CREATININE   < >  --  0.72 0.60 1.03* 1.31* 1.09*  CALCIUM   < >  --  8.3* 8.1* 7.8* 8.1* 7.4*  MG  --  1.5*  --  2.2  --   --  1.9  PHOS  --   --   --  3.4  --   --  5.3*   < > = values in this interval not displayed.   GFR: Estimated Creatinine Clearance: 102.9 mL/min (A) (by C-G formula based on SCr of 1.09 mg/dL (H)). Liver Function Tests: Recent Labs  Lab 05/09/20 0500  AST 17  ALT 12  ALKPHOS 130*  BILITOT 0.7  PROT 7.9  ALBUMIN 2.0*   No results for input(s): LIPASE, AMYLASE in the last 168 hours. No results for input(s): AMMONIA in the last 168 hours. Coagulation Profile: No results for input(s): INR, PROTIME in the last 168 hours. Cardiac Enzymes: No results for input(s): CKTOTAL, CKMB, CKMBINDEX, TROPONINI in the last 168 hours. BNP (last 3 results) No results for input(s): PROBNP in the last  8760 hours. HbA1C: No results for input(s): HGBA1C in the last 72 hours. CBG: No results for input(s): GLUCAP in the last 168 hours. Lipid Profile: No results for input(s): CHOL, HDL, LDLCALC, TRIG, CHOLHDL, LDLDIRECT in the last 72 hours. Thyroid Function Tests: Recent Labs    05/09/20 0500  TSH 3.236   Anemia Panel: Recent Labs    05/09/20 0500  VITAMINB12 579  FOLATE 11.9  FERRITIN 417*  TIBC 147*  IRON 25*  RETICCTPCT 1.4   Sepsis Labs: Recent Labs  Lab 05/05/20 0143 05/08/20 1941 05/08/20 2300 05/09/20 0200  LATICACIDVEN 1.3 2.1* 1.2 0.9    Recent Results (from the past 240 hour(s))  Blood culture (routine x 2)     Status: None (Preliminary result)   Collection Time: 05/05/20  1:43 AM   Specimen: BLOOD  Result Value Ref Range Status   Specimen Description   Final    BLOOD BLOOD LEFT WRIST Performed at [facility], [place] [address] [place], [place] [address]    Special Requests   Final    BOTTLES DRAWN AEROBIC AND ANAEROBIC Blood Culture adequate volume Performed at [place] [address], [place], [place] [address]    Culture   Final    NO GROWTH 3 DAYS Performed at [facility], [place] [address] [place], [place] [address]    Report Status PENDING  Incomplete  Respiratory Panel by RT PCR (Flu A&B, Covid) - Nasopharyngeal Swab     Status: None   Collection Time: 05/05/20  2:57 AM   Specimen: Nasopharyngeal Swab  Result Value Ref Range Status   SARS Coronavirus 2 by RT PCR NEGATIVE NEGATIVE Final    Comment: (NOTE) SARS-CoV-2 target nucleic acids are NOT DETECTED.  The SARS-CoV-2 RNA is generally detectable in upper respiratoy specimens during the acute phase of infection. The lowest concentration of SARS-CoV-2 viral copies this  assay can detect is 131 copies/mL. A negative result does not preclude SARS-Cov-2 infection and should not be used as the sole basis for treatment or other patient management  decisions. A negative result may occur with  improper specimen collection/handling, submission of specimen other than nasopharyngeal swab, presence of viral mutation(s) within the areas targeted by this assay, and inadequate number of viral copies (<131 copies/mL). A negative result must be combined with clinical observations, patient history, and epidemiological information. The expected result is Negative.  Fact Sheet for Patients:  PinkCheek.be  Fact Sheet for Healthcare Providers:  GravelBags.it  This test is no t yet approved or cleared by the [place] FDA and  has been authorized for detection and/or diagnosis of SARS-CoV-2 by FDA under an Emergency Use Authorization (EUA). This EUA will remain  in effect (meaning this test can be used) for the duration of the COVID-19 declaration under Section 564(b)(1) of the Act, 21 U.S.C. section 360bbb-3(b)(1), unless the authorization is terminated or revoked sooner.     Influenza A by PCR NEGATIVE NEGATIVE Final   Influenza B by PCR NEGATIVE NEGATIVE Final    Comment: (NOTE) The Xpert Xpress SARS-CoV-2/FLU/RSV assay is intended as an aid in  the diagnosis of influenza from Nasopharyngeal swab specimens and  should not be used as a sole basis for treatment. Nasal washings and  aspirates are unacceptable for Xpert Xpress SARS-CoV-2/FLU/RSV  testing.  Fact Sheet for Patients: PinkCheek.be  Fact Sheet for Healthcare Providers: GravelBags.it  This test is not yet approved or cleared by the [place] FDA and  has been authorized for detection and/or diagnosis of SARS-CoV-2 by  FDA under an Emergency Use Authorization (EUA). This EUA will remain  in effect (meaning this test can be used) for the duration of the  Covid-19 declaration under Section 564(b)(1) of the Act, 21  U.S.C. section 360bbb-3(b)(1), unless the  authorization is  terminated or revoked. Performed at [facility], [place] [address] [place], [place] [address]   Blood culture (routine x 2)     Status: None (Preliminary result)   Collection Time: 05/05/20  9:17 AM   Specimen: BLOOD  Result Value Ref Range Status   Specimen Description   Final    BLOOD BLOOD RIGHT FOREARM Performed at [place] [address] [place], [place] [address]    Special Requests   Final    BOTTLES DRAWN AEROBIC AND ANAEROBIC Blood Culture adequate volume Performed at [place] [address], [place], [place] [address]    Culture   Final    NO GROWTH 3 DAYS Performed at [facility], [place] [address], [place], [place] [address]    Report Status PENDING  Incomplete  Aerobic/Anaerobic Culture (surgical/deep wound)     Status: None (Preliminary result)   Collection Time: 05/05/20  5:51 PM   Specimen: Joint, Other; Body Fluid  Result Value Ref Range Status   Specimen Description   Final    SYNOVIAL RIGHT WRIST Performed at [place] [address], [place],  [address]    Special Requests   Final    NONE Performed at [facility], [place] [address] [place],  [address]    Gram Stain   Final    RARE WBC PRESENT, PREDOMINANTLY PMN NO ORGANISMS SEEN    Culture   Final    NO GROWTH 2 DAYS NO ANAEROBES ISOLATED; CULTURE IN PROGRESS FOR 5 DAYS Performed  at [facility], [place] [address], [place], [place] [address]    Report Status PENDING  Incomplete  Aerobic/Anaerobic Culture (surgical/deep wound)     Status: None (Preliminary result)   Collection Time: 05/05/20  5:53 PM   Specimen: Wound  Result Value Ref Range Status   Specimen Description   Final    ABSCESS LEFT FOREARM 1 Performed at [place] [address], [place], [place] [address]    Special Requests   Final    NONE Performed at [facility], [place] [address], [place], [place] [address]    Gram Stain   Final    ABUNDANT WBC PRESENT, PREDOMINANTLY PMN ABUNDANT GRAM POSITIVE COCCI Performed at [facility], [place] [address] [place], [place] [address]    Culture   Final    RARE GROUP A STREP (S.PYOGENES) ISOLATED Beta hemolytic streptococci are predictably susceptible to penicillin and other beta lactams. Susceptibility testing not routinely performed. NO ANAEROBES ISOLATED; CULTURE IN PROGRESS FOR 5 DAYS    Report Status PENDING  Incomplete  Aerobic/Anaerobic Culture (surgical/deep wound)     Status: None (Preliminary result)   Collection Time: 05/05/20  6:04 PM   Specimen: Wound  Result Value Ref Range Status   Specimen Description   Final    ABSCESS LEFT FOREARM 2 Performed at [place] [address] [place], [place] [address]    Special Requests   Final    NONE Performed at [facility], [place] [address], [place], [place] [address]    Gram Stain   Final    ABUNDANT WBC PRESENT, PREDOMINANTLY PMN ABUNDANT GRAM POSITIVE COCCI Performed at [facility], [place] [address], [place], [place] [address]    Culture   Final    RARE GROUP A STREP (S.PYOGENES) ISOLATED Beta hemolytic streptococci are predictably susceptible to penicillin and other beta lactams. Susceptibility testing not routinely performed. NO ANAEROBES ISOLATED; CULTURE IN PROGRESS FOR 5 DAYS    Report Status PENDING  Incomplete  Respiratory Panel by RT PCR (Flu A&B, Covid) - Nasopharyngeal Swab     Status: None   Collection Time: 05/08/20  7:09 PM   Specimen: Nasopharyngeal Swab  Result Value Ref Range Status   SARS Coronavirus 2 by RT PCR NEGATIVE NEGATIVE Final    Comment: (NOTE) SARS-CoV-2 target nucleic acids are NOT DETECTED.  The SARS-CoV-2 RNA is generally detectable in upper respiratoy specimens during the acute phase of infection. The lowest concentration of SARS-CoV-2  viral copies this assay can detect is 131 copies/mL. A negative result does not preclude SARS-Cov-2 infection and should not be used as the sole basis for treatment or other patient management decisions. A negative result may occur with  improper specimen collection/handling, submission of specimen other than nasopharyngeal swab, presence of viral mutation(s) within the areas targeted by this assay, and inadequate number of viral copies (<131 copies/mL). A negative result must be combined with clinical observations, patient history, and epidemiological information. The expected result is Negative.  Fact Sheet for Patients:  PinkCheek.be  Fact Sheet for Healthcare Providers:  GravelBags.it  This test is no t yet approved or cleared by the [place] FDA and  has been authorized for detection and/or diagnosis of SARS-CoV-2 by FDA under an Emergency Use Authorization (EUA). This EUA will remain  in effect (meaning this test can be used) for the duration of the COVID-19 declaration under Section 564(b)(1) of the Act, 21  U.S.C. section 360bbb-3(b)(1), unless the authorization is terminated or revoked sooner.     Influenza A by PCR NEGATIVE NEGATIVE Final   Influenza B by PCR NEGATIVE NEGATIVE Final    Comment: (NOTE) The Xpert Xpress SARS-CoV-2/FLU/RSV assay is intended as an aid in  the diagnosis of influenza from Nasopharyngeal swab specimens and  should not be used as a sole basis for treatment. Nasal washings and  aspirates are unacceptable for Xpert Xpress SARS-CoV-2/FLU/RSV  testing.  Fact Sheet for Patients: PinkCheek.be  Fact Sheet for Healthcare Providers: GravelBags.it  This test is not yet approved or cleared by the [place] FDA and  has been authorized for detection and/or diagnosis of SARS-CoV-2 by  FDA under an Emergency Use Authorization (EUA).  This EUA will remain  in effect (meaning this test can be used) for the duration of the  Covid-19 declaration under Section 564(b)(1) of the Act, 21  U.S.C. section 360bbb-3(b)(1), unless the authorization is  terminated or revoked. Performed at [facility], [place] [address] [place], [place] [address]   MRSA PCR Screening     Status: None   Collection Time: 05/09/20 12:07 AM   Specimen: Nasopharyngeal  Result Value Ref Range Status   MRSA by PCR NEGATIVE NEGATIVE Final    Comment:        The GeneXpert MRSA Assay (FDA approved for NASAL specimens only), is one component of a comprehensive MRSA colonization surveillance program. It is not intended to diagnose MRSA infection nor to guide or monitor treatment for MRSA infections. Performed at [facility], [place] [address], [place], [place] [address]     Radiology Studies: No results found.   LOS: 1 day   Antonieta Pert, MD [facility]  05/09/2020, 8:46 AM

## 2020-05-09 NOTE — ED Notes (Addendum)
Patient unable to ambulate for pulse/oximetry at this time/patient refusal

## 2020-05-09 NOTE — ED Notes (Signed)
Dinner tray delivered.

## 2020-05-09 NOTE — Discharge Summary (Signed)
Physician Discharge Summary  Talon Witting GGE:[id number] DOB: [birth year] DOA: 05/04/2020  PCP: Merryl Hacker, No  Admit date: 05/04/2020 Discharge date: 05/09/2020  Admitted From: home Disposition:  AMA  Discharge Condition: ama Code Status:   Code Status: Full Code Diet recommendation:  Diet Order    None       Brief/Interim Summary: 39 year-old female with drug abuse other multiple comorbidities was admitted on 10/5 and was being treated for sepsis due to cellulitis and abscess of the left forearm swelling on the right wrist, who drainage of the abscess, was being followed by hand surgery, infection disease and was getting IV antibiotics.  We are waiting for wound culture and further disposition on antibiotic regimen. Unfortunately patient left AGAINST MEDICAL ADVICE in the night of 05/07/2018 despite explaining risk of leaving AMA which includes worsening of infection sepsis not limited to significant disability and death. Please see the progress note done on the same date for more detail.  Discharge Diagnoses:  Principal Problem:   Sepsis due to cellulitis and abscess. Active Problems: Polysubstance abuse (HCC) Hypokalemia Hyponatremia Anemia Morbid obesity Discharge Exam: Vitals:   05/07/20 1800 05/07/20 2131  BP:  (!) 155/88  Pulse:  98  Resp: (!) 21 16  Temp:  98.4 F (36.9 C)  SpO2:  100%   Discharge Instructions   Allergies as of 05/07/2020      Reactions   Amoxicillin-pot Clavulanate Anaphylaxis   Imitrex [sumatriptan Base] Other (See Comments)   Heart races   Cephalexin Rash      Medication List    ASK your doctor about these medications   ciprofloxacin 500 MG tablet Commonly known as: CIPRO Take 1 tablet (500 mg total) by mouth every 12 (twelve) hours.   ibuprofen 200 MG tablet Commonly known as: ADVIL Take 400 mg by mouth every 6 (six) hours as needed. For pain   nitrofurantoin (macrocrystal-monohydrate) 100 MG capsule Commonly known as: MACROBID Take 1  capsule (100 mg total) by mouth 2 (two) times daily.   silver sulfADIAZINE 1 % cream Commonly known as: SILVADENE Apply 1 application topically daily. Thin layer after washing tid       Allergies  Allergen Reactions  . Amoxicillin-Pot Clavulanate Anaphylaxis  . Imitrex [Sumatriptan Base] Other (See Comments)    Heart races  . Cephalexin Rash    The results of significant diagnostics from this hospitalization (including imaging, microbiology, ancillary and laboratory) are listed below for reference.    Microbiology: Recent Results (from the past 240 hour(s))  Blood culture (routine x 2)     Status: None (Preliminary result)   Collection Time: 05/05/20  1:43 AM   Specimen: BLOOD  Result Value Ref Range Status   Specimen Description   Final    BLOOD BLOOD LEFT WRIST Performed at [place] [address], [place], [place] [address]    Special Requests   Final    BOTTLES DRAWN AEROBIC AND ANAEROBIC Blood Culture adequate volume Performed at [facility] [address], [place], [place] [address]    Culture   Final    NO GROWTH 3 DAYS Performed at [facility], [place] [address] [place], [place] [address]    Report Status PENDING  Incomplete  Respiratory Panel by RT PCR (Flu A&B, Covid) - Nasopharyngeal Swab     Status: None   Collection Time: 05/05/20  2:57 AM   Specimen: Nasopharyngeal Swab  Result Value Ref Range Status   SARS Coronavirus 2  by RT PCR NEGATIVE NEGATIVE Final    Comment: (NOTE) SARS-CoV-2 target nucleic acids are NOT DETECTED.  The SARS-CoV-2 RNA is generally detectable in upper respiratoy specimens during the acute phase of infection. The lowest concentration of SARS-CoV-2 viral copies this assay can detect is 131 copies/mL. A negative result does not preclude SARS-Cov-2 infection and should not be used as the sole basis for treatment or other patient management decisions. A negative result may occur with   improper specimen collection/handling, submission of specimen other than nasopharyngeal swab, presence of viral mutation(s) within the areas targeted by this assay, and inadequate number of viral copies (<131 copies/mL). A negative result must be combined with clinical observations, patient history, and epidemiological information. The expected result is Negative.  Fact Sheet for Patients:  PinkCheek.be  Fact Sheet for Healthcare Providers:  GravelBags.it  This test is no t yet approved or cleared by the [place] FDA and  has been authorized for detection and/or diagnosis of SARS-CoV-2 by FDA under an Emergency Use Authorization (EUA). This EUA will remain  in effect (meaning this test can be used) for the duration of the COVID-19 declaration under Section 564(b)(1) of the Act, 21 U.S.C. section 360bbb-3(b)(1), unless the authorization is terminated or revoked sooner.     Influenza A by PCR NEGATIVE NEGATIVE Final   Influenza B by PCR NEGATIVE NEGATIVE Final    Comment: (NOTE) The Xpert Xpress SARS-CoV-2/FLU/RSV assay is intended as an aid in  the diagnosis of influenza from Nasopharyngeal swab specimens and  should not be used as a sole basis for treatment. Nasal washings and  aspirates are unacceptable for Xpert Xpress SARS-CoV-2/FLU/RSV  testing.  Fact Sheet for Patients: PinkCheek.be  Fact Sheet for Healthcare Providers: GravelBags.it  This test is not yet approved or cleared by the [place] FDA and  has been authorized for detection and/or diagnosis of SARS-CoV-2 by  FDA under an Emergency Use Authorization (EUA). This EUA will remain  in effect (meaning this test can be used) for the duration of the  Covid-19 declaration under Section 564(b)(1) of the Act, 21  U.S.C. section 360bbb-3(b)(1), unless the authorization is  terminated or  revoked. Performed at [facility], [place] [address], [place], [place] [address]   Blood culture (routine x 2)     Status: None (Preliminary result)   Collection Time: 05/05/20  9:17 AM   Specimen: BLOOD  Result Value Ref Range Status   Specimen Description   Final    BLOOD BLOOD RIGHT FOREARM Performed at [place] [address] [place], [place] [address]    Special Requests   Final    BOTTLES DRAWN AEROBIC AND ANAEROBIC Blood Culture adequate volume Performed at [place] [address], [place], [place] [address]    Culture   Final    NO GROWTH 3 DAYS Performed at [facility], [place] [address] [place], [place] [address]    Report Status PENDING  Incomplete  Aerobic/Anaerobic Culture (surgical/deep wound)     Status: None (Preliminary result)   Collection Time: 05/05/20  5:51 PM   Specimen: Joint, Other; Body Fluid  Result Value Ref Range Status   Specimen Description   Final    SYNOVIAL RIGHT WRIST Performed at [place] [address], [place], [place] [address]    Special Requests   Final    NONE Performed at [facility], [place] [address] [place], [place] [address]  Gram Stain   Final    RARE WBC PRESENT, PREDOMINANTLY PMN NO ORGANISMS SEEN    Culture   Final    NO GROWTH 2 DAYS NO ANAEROBES ISOLATED; CULTURE IN PROGRESS FOR 5 DAYS Performed at [facility] [address], [place], [place] [address]    Report Status PENDING  Incomplete  Aerobic/Anaerobic Culture (surgical/deep wound)     Status: None (Preliminary result)   Collection Time: 05/05/20  5:53 PM   Specimen: Wound  Result Value Ref Range Status   Specimen Description   Final    ABSCESS LEFT FOREARM 1 Performed at [facility] [address], [place], [place] [address]    Special Requests   Final    NONE Performed at [facility], [place]  [address], [place], [place] [address]    Gram Stain   Final    ABUNDANT WBC PRESENT, PREDOMINANTLY PMN ABUNDANT GRAM POSITIVE COCCI Performed at [facility], [place] [address], [place], [place] [address]    Culture   Final    RARE GROUP A STREP (S.PYOGENES) ISOLATED Beta hemolytic streptococci are predictably susceptible to penicillin and other beta lactams. Susceptibility testing not routinely performed. NO ANAEROBES ISOLATED; CULTURE IN PROGRESS FOR 5 DAYS    Report Status PENDING  Incomplete  Aerobic/Anaerobic Culture (surgical/deep wound)     Status: None (Preliminary result)   Collection Time: 05/05/20  6:04 PM   Specimen: Wound  Result Value Ref Range Status   Specimen Description   Final    ABSCESS LEFT FOREARM 2 Performed at [place] [address] [place], [place] [address]    Special Requests   Final    NONE Performed at [facility], [place] [address], [place], [place] [address]    Gram Stain   Final    ABUNDANT WBC PRESENT, PREDOMINANTLY PMN ABUNDANT GRAM POSITIVE COCCI Performed at [facility], [place] [address], [place], [place] [address]    Culture   Final    RARE GROUP A STREP (S.PYOGENES) ISOLATED Beta hemolytic streptococci are predictably susceptible to penicillin and other beta lactams. Susceptibility testing not routinely performed. NO ANAEROBES ISOLATED; CULTURE IN PROGRESS FOR 5 DAYS    Report Status PENDING  Incomplete  Respiratory Panel by RT PCR (Flu A&B, Covid) - Nasopharyngeal Swab     Status: None   Collection Time: 05/08/20  7:09 PM   Specimen: Nasopharyngeal Swab  Result Value Ref Range Status   SARS Coronavirus 2 by RT PCR NEGATIVE NEGATIVE Final    Comment: (NOTE) SARS-CoV-2 target nucleic acids are NOT DETECTED.  The SARS-CoV-2 RNA is generally detectable in upper respiratoy specimens during the acute phase of infection. The lowest concentration of SARS-CoV-2 viral copies this assay can  detect is 131 copies/mL. A negative result does not preclude SARS-Cov-2 infection and should not be used as the sole basis for treatment or other patient management decisions. A negative result may occur with  improper specimen collection/handling, submission of specimen other than nasopharyngeal swab, presence of viral mutation(s) within the areas targeted by this assay, and inadequate number of viral copies (<131 copies/mL). A negative result must be combined with clinical observations, patient history, and epidemiological information. The expected result is Negative.  Fact Sheet for Patients:  PinkCheek.be  Fact Sheet for Healthcare Providers:  GravelBags.it  This test is no t yet approved or cleared by the [place] and  has been authorized for  detection and/or diagnosis of SARS-CoV-2 by FDA under an Emergency Use Authorization (EUA). This EUA will remain  in effect (meaning this test can be used) for the duration of the COVID-19 declaration under Section 564(b)(1) of the Act, 21 U.S.C. section 360bbb-3(b)(1), unless the authorization is terminated or revoked sooner.     Influenza A by PCR NEGATIVE NEGATIVE Final   Influenza B by PCR NEGATIVE NEGATIVE Final    Comment: (NOTE) The Xpert Xpress SARS-CoV-2/FLU/RSV assay is intended as an aid in  the diagnosis of influenza from Nasopharyngeal swab specimens and  should not be used as a sole basis for treatment. Nasal washings and  aspirates are unacceptable for Xpert Xpress SARS-CoV-2/FLU/RSV  testing.  Fact Sheet for Patients: PinkCheek.be  Fact Sheet for Healthcare Providers: GravelBags.it  This test is not yet approved or cleared by the [place] FDA and  has been authorized for detection and/or diagnosis of SARS-CoV-2 by  FDA under an Emergency Use Authorization (EUA). This EUA will remain  in  effect (meaning this test can be used) for the duration of the  Covid-19 declaration under Section 564(b)(1) of the Act, 21  U.S.C. section 360bbb-3(b)(1), unless the authorization is  terminated or revoked. Performed at [facility], [place] [address], [place], [place] [address]   MRSA PCR Screening     Status: None   Collection Time: 05/09/20 12:07 AM   Specimen: Nasopharyngeal  Result Value Ref Range Status   MRSA by PCR NEGATIVE NEGATIVE Final    Comment:        The GeneXpert MRSA Assay (FDA approved for NASAL specimens only), is one component of a comprehensive MRSA colonization surveillance program. It is not intended to diagnose MRSA infection nor to guide or monitor treatment for MRSA infections. Performed at [facility], [place] [address], [place], [place] [address]     Procedures/Studies: Darletta Moll Wrist Complete Right  Result Date: 05/04/2020 CLINICAL DATA:  Pain EXAM: RIGHT WRIST - COMPLETE 3+ VIEW COMPARISON:  None. FINDINGS: There is no evidence of fracture or dislocation. There is no evidence of arthropathy or other focal bone abnormality. Soft tissues are unremarkable. IMPRESSION: Negative. Electronically Signed   By: Constance Holster M.D.   On: 05/04/2020 23:05   DG Foot Complete Right  Result Date: 05/04/2020 CLINICAL DATA:  Pain and swelling EXAM: RIGHT FOOT COMPLETE - 3+ VIEW COMPARISON:  None. FINDINGS: There is no evidence of fracture or dislocation. There are degenerative changes of the midfoot. There is a large plantar calcaneal spur. There is soft tissue swelling about the foot. IMPRESSION: Soft tissue swelling without evidence of an acute fracture or dislocation. Electronically Signed   By: Constance Holster M.D.   On: 05/04/2020 23:06     Labs: BNP (last 3 results) No results for input(s): BNP in the last 8760 hours. Basic Metabolic Panel: Recent Labs  Lab 05/04/20 2338 05/05/20 0143 05/05/20 0917 05/06/20 0527  05/07/20 0432 05/08/20 1909 05/09/20 0500  NA   < >  --  133* 134* 134* 137 132*  K   < >  --  3.3* 3.6 3.5 2.8* 3.1*  CL   < >  --  98 100 102 100 100  CO2   < >  --  23 25 25 24 23   GLUCOSE   < >  --  97 90 97 123* 98  BUN   < >  --  9 11 14 14 12   CREATININE   < >  --  0.72 0.60 1.03* 1.31* 1.09*  CALCIUM   < >  --  8.3* 8.1* 7.8* 8.1* 7.4*  MG  --  1.5*  --  2.2  --   --  1.9  PHOS  --   --   --  3.4  --   --  5.3*   < > = values in this interval not displayed.   Liver Function Tests: Recent Labs  Lab 05/09/20 0500  AST 17  ALT 12  ALKPHOS 130*  BILITOT 0.7  PROT 7.9  ALBUMIN 2.0*   No results for input(s): LIPASE, AMYLASE in the last 168 hours. No results for input(s): AMMONIA in the last 168 hours. CBC: Recent Labs  Lab 05/04/20 2338 05/04/20 2338 05/05/20 0917 05/06/20 0527 05/07/20 0432 05/08/20 1909 05/09/20 0500  WBC 16.4*   < > 21.4* 19.5* 17.0* 15.6* 15.4*  NEUTROABS 14.7*  --   --   --   --  11.2* 11.0*  HGB 10.5*   < > 9.7* 9.2* 8.7* 8.9* 8.4*  HCT 31.9*   < > 29.2* 28.8* 27.3* 28.1* 26.6*  MCV 85.3   < > 83.7 86.7 87.5 86.5 86.9  PLT 262   < > 235 245 278 324 309   < > = values in this interval not displayed.   Cardiac Enzymes: No results for input(s): CKTOTAL, CKMB, CKMBINDEX, TROPONINI in the last 168 hours. BNP: Invalid input(s): POCBNP CBG: No results for input(s): GLUCAP in the last 168 hours. D-Dimer No results for input(s): DDIMER in the last 72 hours. Hgb A1c No results for input(s): HGBA1C in the last 72 hours. Lipid Profile No results for input(s): CHOL, HDL, LDLCALC, TRIG, CHOLHDL, LDLDIRECT in the last 72 hours. Thyroid function studies Recent Labs    05/09/20 0500  TSH 3.236   Anemia work up Recent Labs    05/09/20 0500  VITAMINB12 579  FOLATE 11.9  FERRITIN 417*  TIBC 147*  IRON 25*  RETICCTPCT 1.4   Urinalysis    Component Value Date/Time   COLORURINE YELLOW 05/08/2020 2158   APPEARANCEUR CLEAR 05/08/2020  2158   LABSPEC 1.013 05/08/2020 2158   PHURINE 5.0 05/08/2020 2158   GLUCOSEU NEGATIVE 05/08/2020 2158   HGBUR SMALL (A) 05/08/2020 2158   BILIRUBINUR NEGATIVE 05/08/2020 2158   KETONESUR NEGATIVE 05/08/2020 2158   PROTEINUR NEGATIVE 05/08/2020 2158   UROBILINOGEN 0.2 10/09/2008 1457   NITRITE NEGATIVE 05/08/2020 2158   LEUKOCYTESUR NEGATIVE 05/08/2020 2158   Sepsis Labs Invalid input(s): PROCALCITONIN,  WBC,  LACTICIDVEN Microbiology Recent Results (from the past 240 hour(s))  Blood culture (routine x 2)     Status: None (Preliminary result)   Collection Time: 05/05/20  1:43 AM   Specimen: BLOOD  Result Value Ref Range Status   Specimen Description   Final    BLOOD BLOOD LEFT WRIST Performed at [facility], [place] [address] [place], [place] [address]    Special Requests   Final    BOTTLES DRAWN AEROBIC AND ANAEROBIC Blood Culture adequate volume Performed at [place] [address], [place], [place] [address]    Culture   Final    NO GROWTH 3 DAYS Performed at [facility], [place] [address], [place], [place] [address]    Report Status PENDING  Incomplete  Respiratory Panel by RT PCR (Flu A&B, Covid) - Nasopharyngeal Swab     Status: None   Collection Time: 05/05/20  2:57 AM   Specimen: Nasopharyngeal Swab  Result Value Ref Range Status   SARS Coronavirus 2 by RT PCR NEGATIVE NEGATIVE Final    Comment: (NOTE) SARS-CoV-2 target nucleic acids are NOT DETECTED.  The SARS-CoV-2 RNA is generally detectable in upper respiratoy specimens during the acute phase of infection. The lowest concentration of SARS-CoV-2 viral copies this assay can detect is 131 copies/mL. A negative result does not preclude SARS-Cov-2 infection and should not be used as the sole basis for treatment or other patient management decisions. A negative result may occur with  improper specimen collection/handling, submission of specimen other than  nasopharyngeal swab, presence of viral mutation(s) within the areas targeted by this assay, and inadequate number of viral copies (<131 copies/mL). A negative result must be combined with clinical observations, patient history, and epidemiological information. The expected result is Negative.  Fact Sheet for Patients:  PinkCheek.be  Fact Sheet for Healthcare Providers:  GravelBags.it  This test is no t yet approved or cleared by the [place] FDA and  has been authorized for detection and/or diagnosis of SARS-CoV-2 by FDA under an Emergency Use Authorization (EUA). This EUA will remain  in effect (meaning this test can be used) for the duration of the COVID-19 declaration under Section 564(b)(1) of the Act, 21 U.S.C. section 360bbb-3(b)(1), unless the authorization is terminated or revoked sooner.     Influenza A by PCR NEGATIVE NEGATIVE Final   Influenza B by PCR NEGATIVE NEGATIVE Final    Comment: (NOTE) The Xpert Xpress SARS-CoV-2/FLU/RSV assay is intended as an aid in  the diagnosis of influenza from Nasopharyngeal swab specimens and  should not be used as a sole basis for treatment. Nasal washings and  aspirates are unacceptable for Xpert Xpress SARS-CoV-2/FLU/RSV  testing.  Fact Sheet for Patients: PinkCheek.be  Fact Sheet for Healthcare Providers: GravelBags.it  This test is not yet approved or cleared by the [place] FDA and  has been authorized for detection and/or diagnosis of SARS-CoV-2 by  FDA under an Emergency Use Authorization (EUA). This EUA will remain  in effect (meaning this test can be used) for the duration of the  Covid-19 declaration under Section 564(b)(1) of the Act, 21  U.S.C. section 360bbb-3(b)(1), unless the authorization is  terminated or revoked. Performed at [facility], [place] [address] [place], [place] [address]   Blood culture (routine x 2)     Status: None (Preliminary result)   Collection Time: 05/05/20  9:17 AM   Specimen: BLOOD  Result Value Ref Range Status   Specimen Description   Final    BLOOD BLOOD RIGHT FOREARM Performed at [place] [address], [place], [place] [address]    Special Requests   Final    BOTTLES DRAWN AEROBIC AND ANAEROBIC Blood Culture adequate volume Performed at [place] [address] [place], [place] [address]    Culture   Final    NO GROWTH 3 DAYS Performed at [facility], [place] [address], [place], [place] [address]    Report Status PENDING  Incomplete  Aerobic/Anaerobic Culture (surgical/deep wound)     Status: None (Preliminary result)   Collection Time: 05/05/20  5:51 PM   Specimen: Joint, Other; Body Fluid  Result Value Ref Range Status   Specimen Description   Final    SYNOVIAL RIGHT WRIST Performed at [place] [address] [place], [place] [address]    Special Requests   Final    NONE Performed at [facility]  [place] [address] [place], [place] [address]    Gram Stain   Final    RARE WBC PRESENT, PREDOMINANTLY PMN NO ORGANISMS SEEN    Culture   Final    NO GROWTH 2 DAYS NO ANAEROBES ISOLATED; CULTURE IN PROGRESS FOR 5 DAYS Performed at [place] [address], [place], [place] [address]    Report Status PENDING  Incomplete  Aerobic/Anaerobic Culture (surgical/deep wound)     Status: None (Preliminary result)   Collection Time: 05/05/20  5:53 PM   Specimen: Wound  Result Value Ref Range Status   Specimen Description   Final    ABSCESS LEFT FOREARM 1 Performed at [facility] [address], [place], [place] [address]    Special Requests   Final    NONE Performed at [facility], [place] [address], [place], [place] [address]    Gram Stain   Final    ABUNDANT WBC  PRESENT, PREDOMINANTLY PMN ABUNDANT GRAM POSITIVE COCCI Performed at [facility], [place] [address], [place], [place] [address]    Culture   Final    RARE GROUP A STREP (S.PYOGENES) ISOLATED Beta hemolytic streptococci are predictably susceptible to penicillin and other beta lactams. Susceptibility testing not routinely performed. NO ANAEROBES ISOLATED; CULTURE IN PROGRESS FOR 5 DAYS    Report Status PENDING  Incomplete  Aerobic/Anaerobic Culture (surgical/deep wound)     Status: None (Preliminary result)   Collection Time: 05/05/20  6:04 PM   Specimen: Wound  Result Value Ref Range Status   Specimen Description   Final    ABSCESS LEFT FOREARM 2 Performed at [place] [address], [place], [place] [address]    Special Requests   Final    NONE Performed at [facility], [place] [address] [place],  [address]    Gram Stain   Final    ABUNDANT WBC PRESENT, PREDOMINANTLY PMN ABUNDANT GRAM POSITIVE COCCI Performed at [facility], [place] [address] [place], [place] [address]    Culture   Final    RARE GROUP A STREP (S.PYOGENES) ISOLATED Beta hemolytic streptococci are predictably susceptible to penicillin and other beta lactams. Susceptibility testing not routinely performed. NO ANAEROBES ISOLATED; CULTURE IN PROGRESS FOR 5 DAYS    Report Status PENDING  Incomplete  Respiratory Panel by RT PCR (Flu A&B, Covid) - Nasopharyngeal Swab     Status: None   Collection Time: 05/08/20  7:09 PM   Specimen: Nasopharyngeal Swab  Result Value Ref Range Status   SARS Coronavirus 2 by RT PCR NEGATIVE NEGATIVE Final    Comment: (NOTE) SARS-CoV-2 target nucleic acids are NOT DETECTED.  The SARS-CoV-2 RNA is generally detectable in upper respiratoy specimens during the acute phase of infection. The lowest concentration of SARS-CoV-2 viral copies this assay can detect is 131 copies/mL. A negative result does not preclude  SARS-Cov-2 infection and should not be used as the sole basis for treatment or other patient management decisions. A negative result may occur with  improper specimen collection/handling, submission of specimen other than nasopharyngeal swab, presence of viral mutation(s) within the areas targeted by this assay, and inadequate number of viral copies (<131 copies/mL). A negative result must be combined with clinical observations, patient history, and epidemiological information. The expected result is Negative.  Fact Sheet for Patients:  PinkCheek.be  Fact Sheet for Healthcare Providers:  GravelBags.it  This test is no t yet approved  or cleared by the [place] and  has been authorized for detection and/or diagnosis of SARS-CoV-2 by FDA under an Emergency Use Authorization (EUA). This EUA will remain  in effect (meaning this test can be used) for the duration of the COVID-19 declaration under Section 564(b)(1) of the Act, 21 U.S.C. section 360bbb-3(b)(1), unless the authorization is terminated or revoked sooner.     Influenza A by PCR NEGATIVE NEGATIVE Final   Influenza B by PCR NEGATIVE NEGATIVE Final    Comment: (NOTE) The Xpert Xpress SARS-CoV-2/FLU/RSV assay is intended as an aid in  the diagnosis of influenza from Nasopharyngeal swab specimens and  should not be used as a sole basis for treatment. Nasal washings and  aspirates are unacceptable for Xpert Xpress SARS-CoV-2/FLU/RSV  testing.  Fact Sheet for Patients: PinkCheek.be  Fact Sheet for Healthcare Providers: GravelBags.it  This test is not yet approved or cleared by the [place] FDA and  has been authorized for detection and/or diagnosis of SARS-CoV-2 by  FDA under an Emergency Use Authorization (EUA). This EUA will remain  in effect (meaning this test can be used) for the duration of the   Covid-19 declaration under Section 564(b)(1) of the Act, 21  U.S.C. section 360bbb-3(b)(1), unless the authorization is  terminated or revoked. Performed at [facility], [place] [address] [place], [place] [address]   MRSA PCR Screening     Status: None   Collection Time: 05/09/20 12:07 AM   Specimen: Nasopharyngeal  Result Value Ref Range Status   MRSA by PCR NEGATIVE NEGATIVE Final    Comment:        The GeneXpert MRSA Assay (FDA approved for NASAL specimens only), is one component of a comprehensive MRSA colonization surveillance program. It is not intended to diagnose MRSA infection nor to guide or monitor treatment for MRSA infections. Performed at [facility], [place] [address] [place], [place] [address]      Time coordinating discharge: 0 minutes  SIGNED: Antonieta Pert, MD  [facility] 05/09/2020, 8:50 AM  If 7PM-7AM, please contact night-coverage [email or web address]

## 2020-05-09 NOTE — Progress Notes (Signed)
Right lower extremity venous duplex completed. Refer to "CV Proc" under chart review to view preliminary results.  05/09/2020 12:02 PM Kelby Aline., MHA, RVT, RDCS, RDMS

## 2020-05-09 NOTE — ED Notes (Signed)
Patient has a visitor ?

## 2020-05-10 ENCOUNTER — Other Ambulatory Visit: Payer: Self-pay

## 2020-05-10 ENCOUNTER — Other Ambulatory Visit (HOSPITAL_COMMUNITY): Payer: Self-pay | Admitting: Internal Medicine

## 2020-05-10 DIAGNOSIS — L03114 Cellulitis of left upper limb: Secondary | ICD-10-CM

## 2020-05-10 LAB — CULTURE, BLOOD (ROUTINE X 2)
Culture: NO GROWTH
Culture: NO GROWTH
Special Requests: ADEQUATE
Special Requests: ADEQUATE

## 2020-05-10 LAB — CBC
HCT: 25.3 % — ABNORMAL LOW (ref 36.0–46.0)
Hemoglobin: 7.7 g/dL — ABNORMAL LOW (ref 12.0–15.0)
MCH: 27.2 pg (ref 26.0–34.0)
MCHC: 30.4 g/dL (ref 30.0–36.0)
MCV: 89.4 fL (ref 80.0–100.0)
Platelets: 306 10*3/uL (ref 150–400)
RBC: 2.83 MIL/uL — ABNORMAL LOW (ref 3.87–5.11)
RDW: 15.2 % (ref 11.5–15.5)
WBC: 14.9 10*3/uL — ABNORMAL HIGH (ref 4.0–10.5)
nRBC: 0 % (ref 0.0–0.2)

## 2020-05-10 LAB — BASIC METABOLIC PANEL
Anion gap: 8 (ref 5–15)
BUN: 12 mg/dL (ref 6–20)
CO2: 25 mmol/L (ref 22–32)
Calcium: 7.6 mg/dL — ABNORMAL LOW (ref 8.9–10.3)
Chloride: 103 mmol/L (ref 98–111)
Creatinine, Ser: 1.09 mg/dL — ABNORMAL HIGH (ref 0.44–1.00)
GFR, Estimated: >60 mL/min (ref >60)
Glucose, Bld: 108 mg/dL — ABNORMAL HIGH (ref 70–99)
Potassium: 2.9 mmol/L — ABNORMAL LOW (ref 3.5–5.1)
Sodium: 136 mmol/L (ref 135–145)

## 2020-05-10 LAB — POTASSIUM: Potassium: 3.2 mmol/L — ABNORMAL LOW (ref 3.5–5.1)

## 2020-05-10 MED ORDER — FERROUS SULFATE 325 (65 FE) MG PO TABS
325.0000 mg | ORAL_TABLET | Freq: Two times a day (BID) | ORAL | Status: DC
  Administered 2020-05-10: 325 mg via ORAL
  Filled 2020-05-10: qty 1

## 2020-05-10 MED ORDER — NICOTINE 14 MG/24HR TD PT24
14.0000 mg | MEDICATED_PATCH | Freq: Every day | TRANSDERMAL | Status: DC
  Administered 2020-05-10: 14 mg via TRANSDERMAL
  Filled 2020-05-10: qty 1

## 2020-05-10 MED ORDER — LINEZOLID 600 MG PO TABS: 600.0000 mg | ORAL_TABLET | Freq: Two times a day (BID) | ORAL | 0 refills | Status: DC

## 2020-05-10 MED ORDER — ALPRAZOLAM 0.5 MG PO TABS: 0.5000 mg | ORAL_TABLET | Freq: Every evening | ORAL | Status: DC | PRN

## 2020-05-10 MED ORDER — SILVER SULFADIAZINE 1 % EX CREA: TOPICAL_CREAM | Freq: Two times a day (BID) | CUTANEOUS | 0 refills | Status: DC

## 2020-05-10 MED ORDER — FERROUS SULFATE 325 (65 FE) MG PO TABS: 325.0000 mg | ORAL_TABLET | Freq: Two times a day (BID) | ORAL | 0 refills | Status: DC

## 2020-05-10 MED ORDER — POTASSIUM CHLORIDE CRYS ER 20 MEQ PO TBCR
40.0000 meq | EXTENDED_RELEASE_TABLET | Freq: Once | ORAL | Status: AC
  Administered 2020-05-10: 40 meq via ORAL
  Filled 2020-05-10: qty 2

## 2020-05-10 MED ORDER — SILVER SULFADIAZINE 1 % EX CREA
TOPICAL_CREAM | Freq: Two times a day (BID) | CUTANEOUS | Status: DC
  Filled 2020-05-10: qty 50

## 2020-05-10 MED ORDER — POTASSIUM CHLORIDE CRYS ER 20 MEQ PO TBCR
30.0000 meq | EXTENDED_RELEASE_TABLET | ORAL | Status: AC
  Administered 2020-05-10 (×2): 30 meq via ORAL
  Filled 2020-05-10 (×2): qty 1

## 2020-05-10 MED ORDER — POTASSIUM CHLORIDE CRYS ER 20 MEQ PO TBCR
40.0000 meq | EXTENDED_RELEASE_TABLET | ORAL | Status: AC
  Administered 2020-05-10: 40 meq via ORAL
  Filled 2020-05-10: qty 2

## 2020-05-10 NOTE — Plan of Care (Signed)

## 2020-05-10 NOTE — Progress Notes (Signed)
Pt leaving AMA at this time. Friend at bedside helping pt to dress and will provide her transportation. Prior to pt leaving AMA, she was educated on risks and benefits of staying the course of treatment until medically cleared and discharged by hospitalist. Hospitalist had also been made aware and ordered more medication to try to help pt cope with one more night in hospital. Pt acknowledged but still decided to leave AMA saying she will establish a PCP and follow up out pt. Pt IV and tele were removed.

## 2020-05-10 NOTE — Consult Note (Signed)
[facility] Nurse Consult Note: Reason for Consult:infectious wounds to left arm, I & D sites present (4). Right wrist radiocarpal joint aspiration with pain with movement.  Bilateral lower legs with cellulitis and erythema.  Has been on IV antibiotics.  Left AMA and returned due to pain and worsening symptoms.   Wound type: infectious Pressure Injury POA: NA Measurement: Left arm, incision sites, left antecubital area is 1 cm round.  Wound AYO:KHTX pink   Drainage (amount, consistency, odor) moderate purulence  No odor.  Periwound:edema, erythema and tenderness. Dressing procedure/placement/frequency: Cleanse legs with NS and pat dry.  Apply silvadene to lower legs as ordered.  Cleanse wounds to left arm with NS.  Gently fill wound bed with iodoform packing strip and cover with kerlix and tape.  Change daily.  Will not follow at this time.  Please re-consult if needed.  Domenic Moras MSN, RN, FNP-BC [facility]ON Wound, Ostomy, Continence Nurse Pager [phone]

## 2020-05-10 NOTE — Progress Notes (Signed)
PROGRESS NOTE    Marie Brewer  CXK:[id number] DOB: [birth year] DOA: 05/08/2020 PCP: Pcp, No   Chief Complaint  Patient presents with  . Cellulitis  Brief Narrative: 39 year old female with history of cervical cancer treated with chemoradiation 10 years ago, polysubstance abuse, morbid obesity was admitted on 10/5 for left arm swelling, right wrist swelling, lower extremity chronic cellulitis and wound, was seen by hand surgery underwent left forearm abscess drainage x2 and right wrist joint aspiration by Dr. Claudia Desanctis on 10/6, was seen by ID and was being treated with IV antibiotics, wound culture grew Streptococcus Pyogenes.But unfortunately patient left AGAINST MEDICAL ADVICE 05/07/2020 during the night. She returned to the ED 05/08/2020 to good treatment.  In the ED was hypokalemia, leukocytosis creatinine at baseline lactic acid 2.1.Vancomycin was restarted and admission was requested for management of her cellulitis hypokalemia and AKI.  Subjective: No new complaints Did not sleep well. Left forearm wound dressing done by WOCN friend coming in to learn ho to dress  Assessment & Plan:  Streptococcus pyogenes abscess of the left forearm status post drainage x2/cellulitis of the multiple sites lower extremities, right wrist swelling-s/p joint aspiration on 10/6 no growth so far: seen by ID. Now on ZYVOX-we are arranging for 2 weeks supply of Zyvox for home, spoke with Education officer, museum.  Wound care nurse seen and her friend is coming over to learn how to do dressing.  Nursing/SW arranging supplies for wound care.  Blood culture no growth so far.   Right wrist swelling and pain likely from procedure.  Aspirated fluid w/ No growth.x-ray wrist no acute finding,   Bilateral lower extremity chronic cellulitis/edema/chronic skin changes: Dopplers negative for DVT, lower extremity tibia-fibula and foot x-rays no acute finding-seen by wound care and wound care instruction ordered.  Polysubstance abuse/IV  drug abuse but not forthcoming with her drug use.  UDS 10/9+ for amphetamine and on previous admission positive for opiates, amphetamines.  Supportive measures, TOC consult for outpatient resources.  Hypokalemia severe: Persistently low, got 80 meq KCl today and k still low at 3.2-will order additional 30 meq x2.  Anemia likely from chronic disease?:  Monitor H&H, add ferrous sulfate. Somewhat low iron studies with iron 25 but ferritin high.   AKI: Renal function is stable.  Morbid obesity with BMI 50: Will benefit with weight loss and healthy lifestyle.    We discussed about risk of leaving [facility], she verbalized understanding and is willing to stay in this time.    Nutrition: Diet Order            Diet Heart Room service appropriate? Yes; Fluid consistency: Thin  Diet effective now                DVT prophylaxis: SCDs Start: 05/09/20 0007 Code Status:   Code Status: Full Code  Family Communication: plan of care discussed with patient at bedside.  Status is: Inpatient Remains inpatient appropriate because:IV treatments appropriate due to intensity of illness or inability to take PO, Inpatient level of care appropriate due to severity of illness and Ongoing management of infection  Dispo: The patient is from: Home              Anticipated d/c is to: Home              Anticipated d/c date is: Tomorrow once pain is educated on wound care and once her electrolytes are stable  Patient currently is not medically stable to d/c. Consultants:see note  Procedures:see note  Culture/Microbiology    Component Value Date/Time   SDES  05/08/2020 2154    RIGHT ANTECUBITAL Performed at [facility], [place] [address] [place], [place] [address]    SPECREQUEST  05/08/2020 2154    BOTTLES DRAWN AEROBIC AND ANAEROBIC Blood Culture results may not be optimal due to an inadequate volume of blood received in culture bottles Performed at [facility], [place] [address], [place], [place] [address]    CULT  05/08/2020 2154    NO GROWTH 1 DAY Performed at [facility] [address], [place], [place] [address]    REPTSTATUS PENDING 05/08/2020 2154    Other culture-see note  Medications: Scheduled Meds: . ferrous sulfate  325 mg Oral BID WC  . linezolid  600 mg Oral Q12H  . potassium chloride  30 mEq Oral Q3H  . silver sulfADIAZINE   Topical BID   Continuous Infusions:   Antimicrobials: Anti-infectives (From admission, onward)   Start     Dose/Rate Route Frequency Ordered Stop   05/10/20 0000  linezolid (ZYVOX) 600 MG tablet        600 mg Oral 2 times daily 05/10/20 1355 05/23/20 2359   05/09/20 0800  linezolid (ZYVOX) IVPB 600 mg  Status:  Discontinued        600 mg 300 mL/hr over 60 Minutes Intravenous Every 12 hours 05/08/20 2212 05/09/20 0011   05/09/20 0015  linezolid (ZYVOX) tablet 600 mg        600 mg Oral Every 12 hours 05/09/20 0006     05/08/20 2000  vancomycin (VANCOREADY) IVPB 1500 mg/300 mL  Status:  Discontinued        1,500 mg 150 mL/hr over 120 Minutes Intravenous Every 12 hours 05/08/20 1943 05/09/20 1200     Objective: Vitals: Today's Vitals   05/10/20 0625 05/10/20 0916 05/10/20 1257 05/10/20 1440  BP: 128/73  132/85   Pulse: 69  72   Resp: 16  18   Temp: 98 F (36.7 C)  98.3 F (36.8 C)   TempSrc: Oral  Oral   SpO2: 97%  99%   Weight:      Height:      PainSc:  8   8     Intake/Output Summary (Last 24 hours) at 05/10/2020 1450 Last data filed at 05/10/2020 1100 Gross per 24 hour  Intake 120 ml  Output 2150 ml  Net -2030 ml   Filed Weights   05/08/20 1852 05/09/20 1803  Weight: (!) 145.1 kg 110 kg   Weight change: -35.2 kg  Intake/Output from previous day: 10/10 0701 - 10/11 0700 In: 1601.5 [I.V.:1000; IV Piggyback:601.5] Out: 1150 [Urine:1150] Intake/Output this shift: Total I/O In: 120 [P.O.:120] Out: 1000 [Urine:1000]  Examination: General exam:  AAOx3,NAD, weak appearing. HEENT:Oral mucosa moist, Ear/Nose WNL grossly, dentition normal. Respiratory system: bilaterally clear,no wheezing or crackles,no use of accessory muscle Cardiovascular system: S1 & S2 +, No JVD,. Gastrointestinal system: Abdomen soft, NT,ND, BS+ Nervous System:Alert, awake, moving extremities and grossly nonfocal Extremities: No edema, distal peripheral pulses palpable.  Skin: No rashes,no icterus. MSK: Normal muscle bulk,tone, power Left forearm w/ 4 wounds-dressing intact    Data Reviewed: I have personally reviewed following labs and imaging studies CBC: Recent Labs  Lab 05/04/20 2338 05/05/20 0917 05/06/20 0527 05/07/20 0432 05/08/20 1909 05/09/20 0500 05/10/20 0529  WBC 16.4*   < > 19.5* 17.0* 15.6*  15.4* 14.9*  NEUTROABS 14.7*  --   --   --  11.2* 11.0*  --   HGB 10.5*   < > 9.2* 8.7* 8.9* 8.4* 7.7*  HCT 31.9*   < > 28.8* 27.3* 28.1* 26.6* 25.3*  MCV 85.3   < > 86.7 87.5 86.5 86.9 89.4  PLT 262   < > 245 278 324 309 306   < > = values in this interval not displayed.   Basic Metabolic Panel: Recent Labs  Lab 05/05/20 0143 05/05/20 0917 05/06/20 0527 05/06/20 0527 05/07/20 0432 05/08/20 1909 05/09/20 0500 05/10/20 0529 05/10/20 1404  NA  --    < > 134*  --  134* 137 132* 136  --   K  --    < > 3.6   < > 3.5 2.8* 3.1* 2.9* 3.2*  CL  --    < > 100  --  102 100 100 103  --   CO2  --    < > 25  --  25 24 23 25   --   GLUCOSE  --    < > 90  --  97 123* 98 108*  --   BUN  --    < > 11  --  14 14 12 12   --   CREATININE  --    < > 0.60  --  1.03* 1.31* 1.09* 1.09*  --   CALCIUM  --    < > 8.1*  --  7.8* 8.1* 7.4* 7.6*  --   MG 1.5*  --  2.2  --   --   --  1.9  --   --   PHOS  --   --  3.4  --   --   --  5.3*  --   --    < > = values in this interval not displayed.   GFR: Estimated Creatinine Clearance: 86.2 mL/min (A) (by C-G formula based on SCr of 1.09 mg/dL (H)). Liver Function Tests: Recent Labs  Lab 05/09/20 0500  AST 17  ALT  12  ALKPHOS 130*  BILITOT 0.7  PROT 7.9  ALBUMIN 2.0*   No results for input(s): LIPASE, AMYLASE in the last 168 hours. No results for input(s): AMMONIA in the last 168 hours. Coagulation Profile: No results for input(s): INR, PROTIME in the last 168 hours. Cardiac Enzymes: No results for input(s): CKTOTAL, CKMB, CKMBINDEX, TROPONINI in the last 168 hours. BNP (last 3 results) No results for input(s): PROBNP in the last 8760 hours. HbA1C: No results for input(s): HGBA1C in the last 72 hours. CBG: No results for input(s): GLUCAP in the last 168 hours. Lipid Profile: No results for input(s): CHOL, HDL, LDLCALC, TRIG, CHOLHDL, LDLDIRECT in the last 72 hours. Thyroid Function Tests: Recent Labs    05/09/20 0500  TSH 3.236   Anemia Panel: Recent Labs    05/09/20 0500  VITAMINB12 579  FOLATE 11.9  FERRITIN 417*  TIBC 147*  IRON 25*  RETICCTPCT 1.4   Sepsis Labs: Recent Labs  Lab 05/05/20 0143 05/08/20 1941 05/08/20 2300 05/09/20 0200  LATICACIDVEN 1.3 2.1* 1.2 0.9    Recent Results (from the past 240 hour(s))  Blood culture (routine x 2)     Status: None   Collection Time: 05/05/20  1:43 AM   Specimen: BLOOD  Result Value Ref Range Status   Specimen Description   Final    BLOOD BLOOD LEFT WRIST Performed at [facility]  [address] [place], [place] [address]    Special Requests   Final    BOTTLES DRAWN AEROBIC AND ANAEROBIC Blood Culture adequate volume Performed at [facility] [address] [place], [place] [address]    Culture   Final    NO GROWTH 5 DAYS Performed at [facility], [place] [address], [place], [place] [address]    Report Status 05/10/2020 FINAL  Final  Respiratory Panel by RT PCR (Flu A&B, Covid) - Nasopharyngeal Swab     Status: None   Collection Time: 05/05/20  2:57 AM   Specimen: Nasopharyngeal Swab  Result Value Ref Range Status   SARS Coronavirus 2 by RT PCR NEGATIVE NEGATIVE Final      Comment: (NOTE) SARS-CoV-2 target nucleic acids are NOT DETECTED.  The SARS-CoV-2 RNA is generally detectable in upper respiratoy specimens during the acute phase of infection. The lowest concentration of SARS-CoV-2 viral copies this assay can detect is 131 copies/mL. A negative result does not preclude SARS-Cov-2 infection and should not be used as the sole basis for treatment or other patient management decisions. A negative result may occur with  improper specimen collection/handling, submission of specimen other than nasopharyngeal swab, presence of viral mutation(s) within the areas targeted by this assay, and inadequate number of viral copies (<131 copies/mL). A negative result must be combined with clinical observations, patient history, and epidemiological information. The expected result is Negative.  Fact Sheet for Patients:  PinkCheek.be  Fact Sheet for Healthcare Providers:  GravelBags.it  This test is no t yet approved or cleared by the [place] FDA and  has been authorized for detection and/or diagnosis of SARS-CoV-2 by FDA under an Emergency Use Authorization (EUA). This EUA will remain  in effect (meaning this test can be used) for the duration of the COVID-19 declaration under Section 564(b)(1) of the Act, 21 U.S.C. section 360bbb-3(b)(1), unless the authorization is terminated or revoked sooner.     Influenza A by PCR NEGATIVE NEGATIVE Final   Influenza B by PCR NEGATIVE NEGATIVE Final    Comment: (NOTE) The Xpert Xpress SARS-CoV-2/FLU/RSV assay is intended as an aid in  the diagnosis of influenza from Nasopharyngeal swab specimens and  should not be used as a sole basis for treatment. Nasal washings and  aspirates are unacceptable for Xpert Xpress SARS-CoV-2/FLU/RSV  testing.  Fact Sheet for Patients: PinkCheek.be  Fact Sheet for Healthcare  Providers: GravelBags.it  This test is not yet approved or cleared by the [place] FDA and  has been authorized for detection and/or diagnosis of SARS-CoV-2 by  FDA under an Emergency Use Authorization (EUA). This EUA will remain  in effect (meaning this test can be used) for the duration of the  Covid-19 declaration under Section 564(b)(1) of the Act, 21  U.S.C. section 360bbb-3(b)(1), unless the authorization is  terminated or revoked. Performed at [facility], [place] [address] [place], [place] [address]   Blood culture (routine x 2)     Status: None   Collection Time: 05/05/20  9:17 AM   Specimen: BLOOD  Result Value Ref Range Status   Specimen Description   Final    BLOOD BLOOD RIGHT FOREARM Performed at [facility] [address], [place], [place] [address]    Special Requests   Final    BOTTLES DRAWN AEROBIC AND ANAEROBIC Blood Culture adequate volume Performed at [facility] [address], [place], [place] [address]    Culture  Final    NO GROWTH 5 DAYS Performed at [facility], [place] [address] [place], [place] [address]    Report Status 05/10/2020 FINAL  Final  Aerobic/Anaerobic Culture (surgical/deep wound)     Status: None (Preliminary result)   Collection Time: 05/05/20  5:51 PM   Specimen: Joint, Other; Body Fluid  Result Value Ref Range Status   Specimen Description   Final    SYNOVIAL RIGHT WRIST Performed at [facility] [address] [place], [place] [address]    Special Requests   Final    NONE Performed at [facility], [place] [address] [place], [place] [address]    Gram Stain   Final    RARE WBC PRESENT, PREDOMINANTLY PMN NO ORGANISMS SEEN    Culture   Final    NO GROWTH 4 DAYS NO ANAEROBES ISOLATED; CULTURE IN PROGRESS FOR 5 DAYS Performed at [facility] [address], [place], [place] [address]     Report Status PENDING  Incomplete  Aerobic/Anaerobic Culture (surgical/deep wound)     Status: None (Preliminary result)   Collection Time: 05/05/20  5:53 PM   Specimen: Wound  Result Value Ref Range Status   Specimen Description   Final    ABSCESS LEFT FOREARM 1 Performed at [facility] [address] [place], [place] [address]    Special Requests   Final    NONE Performed at [facility], [place] [address], [place], [place] [address]    Gram Stain   Final    ABUNDANT WBC PRESENT, PREDOMINANTLY PMN ABUNDANT GRAM POSITIVE COCCI Performed at [facility], [place] [address] [place], [place] [address]    Culture   Final    RARE GROUP A STREP (S.PYOGENES) ISOLATED Beta hemolytic streptococci are predictably susceptible to penicillin and other beta lactams. Susceptibility testing not routinely performed. NO ANAEROBES ISOLATED; CULTURE IN PROGRESS FOR 5 DAYS    Report Status PENDING  Incomplete  Aerobic/Anaerobic Culture (surgical/deep wound)     Status: None (Preliminary result)   Collection Time: 05/05/20  6:04 PM   Specimen: Wound  Result Value Ref Range Status   Specimen Description   Final    ABSCESS LEFT FOREARM 2 Performed at [facility] [address], [place], [place] [address]    Special Requests   Final    NONE Performed at [facility], [place] [address] [place], [place] [address]    Gram Stain   Final    ABUNDANT WBC PRESENT, PREDOMINANTLY PMN ABUNDANT GRAM POSITIVE COCCI Performed at [facility], [place] [address], [place], [place] [address]    Culture   Final    RARE GROUP A STREP (S.PYOGENES) ISOLATED Beta hemolytic streptococci are predictably susceptible to penicillin and other beta lactams. Susceptibility testing not routinely performed. NO ANAEROBES ISOLATED; CULTURE IN PROGRESS FOR 5 DAYS    Report Status PENDING  Incomplete  Respiratory Panel by RT PCR (Flu A&B, Covid) -  Nasopharyngeal Swab     Status: None   Collection Time: 05/08/20  7:09 PM   Specimen: Nasopharyngeal Swab  Result Value Ref Range Status   SARS Coronavirus 2 by RT PCR NEGATIVE NEGATIVE Final    Comment: (NOTE) SARS-CoV-2 target nucleic acids are NOT DETECTED.  The SARS-CoV-2 RNA is generally detectable in upper respiratoy specimens during the acute phase of infection. The lowest concentration of SARS-CoV-2 viral copies this assay can detect  is 131 copies/mL. A negative result does not preclude SARS-Cov-2 infection and should not be used as the sole basis for treatment or other patient management decisions. A negative result may occur with  improper specimen collection/handling, submission of specimen other than nasopharyngeal swab, presence of viral mutation(s) within the areas targeted by this assay, and inadequate number of viral copies (<131 copies/mL). A negative result must be combined with clinical observations, patient history, and epidemiological information. The expected result is Negative.  Fact Sheet for Patients:  PinkCheek.be  Fact Sheet for Healthcare Providers:  GravelBags.it  This test is no t yet approved or cleared by the [place] FDA and  has been authorized for detection and/or diagnosis of SARS-CoV-2 by FDA under an Emergency Use Authorization (EUA). This EUA will remain  in effect (meaning this test can be used) for the duration of the COVID-19 declaration under Section 564(b)(1) of the Act, 21 U.S.C. section 360bbb-3(b)(1), unless the authorization is terminated or revoked sooner.     Influenza A by PCR NEGATIVE NEGATIVE Final   Influenza B by PCR NEGATIVE NEGATIVE Final    Comment: (NOTE) The Xpert Xpress SARS-CoV-2/FLU/RSV assay is intended as an aid in  the diagnosis of influenza from Nasopharyngeal swab specimens and  should not be used as a sole basis for treatment. Nasal washings and    aspirates are unacceptable for Xpert Xpress SARS-CoV-2/FLU/RSV  testing.  Fact Sheet for Patients: PinkCheek.be  Fact Sheet for Healthcare Providers: GravelBags.it  This test is not yet approved or cleared by the [place] FDA and  has been authorized for detection and/or diagnosis of SARS-CoV-2 by  FDA under an Emergency Use Authorization (EUA). This EUA will remain  in effect (meaning this test can be used) for the duration of the  Covid-19 declaration under Section 564(b)(1) of the Act, 21  U.S.C. section 360bbb-3(b)(1), unless the authorization is  terminated or revoked. Performed at [facility], [place] [address] [place], [place] [address]   Culture, blood (x 2)     Status: None (Preliminary result)   Collection Time: 05/08/20  9:49 PM   Specimen: BLOOD LEFT HAND  Result Value Ref Range Status   Specimen Description   Final    BLOOD LEFT HAND Performed at [facility] [address] [place], [place] [address]    Special Requests   Final    BOTTLES DRAWN AEROBIC AND ANAEROBIC Blood Culture results may not be optimal due to an inadequate volume of blood received in culture bottles Performed at [facility] [address] [place], [place] [address]    Culture   Final    NO GROWTH 1 DAY Performed at [facility], [place] [address] [place], [place] [address]    Report Status PENDING  Incomplete  Culture, blood (x 2)     Status: None (Preliminary result)   Collection Time: 05/08/20  9:54 PM   Specimen: Right Antecubital; Blood  Result Value Ref Range Status   Specimen Description   Final    RIGHT ANTECUBITAL Performed at [facility] [place] [address], [place], [place] [address]    Special Requests   Final    BOTTLES DRAWN AEROBIC AND ANAEROBIC Blood Culture results may not be optimal due to an inadequate volume of blood received  in culture bottles Performed at [facility] [address] [place], [place] [address]    Culture   Final    NO  GROWTH 1 DAY Performed at [facility], [place] [address] [place], [place] [address]    Report Status PENDING  Incomplete  MRSA PCR Screening     Status: None   Collection Time: 05/09/20 12:07 AM   Specimen: Nasopharyngeal  Result Value Ref Range Status   MRSA by PCR NEGATIVE NEGATIVE Final    Comment:        The GeneXpert MRSA Assay (FDA approved for NASAL specimens only), is one component of a comprehensive MRSA colonization surveillance program. It is not intended to diagnose MRSA infection nor to guide or monitor treatment for MRSA infections. Performed at [facility], [place] [address], [place],  [address]     Radiology Studies: DG Tibia/Fibula Left  Result Date: 05/09/2020 CLINICAL DATA:  Bilateral lower extremity chronic cellulitis with edema and skin changes. EXAM: LEFT TIBIA AND FIBULA - 2 VIEW COMPARISON:  None. FINDINGS: The mineralization and alignment are normal. There is no evidence of acute fracture or dislocation. There is smooth periosteal thickening along the distal tibial and fibular shafts. Minimal degenerative changes are present at the ankle. The soft tissues appear diffusely edematous. Possible mild skin ulceration laterally in the distal lower leg. No soft tissue emphysema, foreign body or calcification identified. IMPRESSION: 1. No acute osseous findings. 2. Diffuse soft tissue edema with possible mild skin ulceration laterally in the distal lower leg. No evidence of soft tissue emphysema or osteomyelitis. 3. Smooth periosteal thickening in the distal tibia and fibula, likely due to chronic venous stasis. Electronically Signed   By: Richardean Sale M.D.   On: 05/09/2020 13:52   DG Tibia/Fibula Right  Result Date: 05/09/2020 CLINICAL DATA:  Bilateral lower extremity chronic cellulitis with edema and  skin changes. EXAM: RIGHT TIBIA AND FIBULA - 2 VIEW COMPARISON:  Right foot radiographs 05/04/2020 FINDINGS: The mineralization and alignment are normal. There is no evidence of acute fracture or dislocation. Mild degenerative changes at the knee and ankle. Mild diffuse soft tissue edema without evidence of foreign body or soft tissue emphysema. IMPRESSION: No acute osseous findings. Mild diffuse soft tissue edema. Electronically Signed   By: Richardean Sale M.D.   On: 05/09/2020 13:53   DG Foot 2 Views Left  Result Date: 05/09/2020 CLINICAL DATA:  Lower extremity cellulitis and edema. EXAM: LEFT FOOT - 2 VIEW COMPARISON:  None. FINDINGS: Normal anatomic alignment. No evidence for acute fracture or dislocation. Regional soft tissues unremarkable. IMPRESSION: No acute osseous abnormality. Electronically Signed   By: Lovey Newcomer M.D.   On: 05/09/2020 13:49   VAS [place] LOWER EXTREMITY VENOUS (DVT)  Result Date: 05/09/2020  Lower Venous DVTStudy Indications: Swelling.  Comparison Study: No prior study Performing Technologist: Maudry Mayhew MHA, RDMS, RVT, RDCS  Examination Guidelines: A complete evaluation includes B-mode imaging, spectral Doppler, color Doppler, and power Doppler as needed of all accessible portions of each vessel. Bilateral testing is considered an integral part of a complete examination. Limited examinations for reoccurring indications may be performed as noted. The reflux portion of the exam is performed with the patient in reverse Trendelenburg.  +---------+---------------+---------+-----------+----------+--------------+ RIGHT    CompressibilityPhasicitySpontaneityPropertiesThrombus Aging +---------+---------------+---------+-----------+----------+--------------+ CFV      Full           Yes      Yes                                 +---------+---------------+---------+-----------+----------+--------------+ SFJ  Full                                                         +---------+---------------+---------+-----------+----------+--------------+ FV Prox  Full                                                        +---------+---------------+---------+-----------+----------+--------------+ FV Mid   Full                                                        +---------+---------------+---------+-----------+----------+--------------+ FV DistalFull                                                        +---------+---------------+---------+-----------+----------+--------------+ PFV      Full                                                        +---------+---------------+---------+-----------+----------+--------------+ POP      Full           Yes      Yes                                 +---------+---------------+---------+-----------+----------+--------------+ PTV      Full                                                        +---------+---------------+---------+-----------+----------+--------------+ PERO     Full                                                        +---------+---------------+---------+-----------+----------+--------------+   +----+---------------+---------+-----------+----------+--------------+ LEFTCompressibilityPhasicitySpontaneityPropertiesThrombus Aging +----+---------------+---------+-----------+----------+--------------+ CFV Full           Yes      Yes                                 +----+---------------+---------+-----------+----------+--------------+     Summary: RIGHT: - There is no evidence of deep vein thrombosis in the lower extremity.  - No cystic structure found in the popliteal fossa. - Ultrasound characteristics of enlarged lymph nodes are noted in the groin.  LEFT: - No evidence of common femoral vein obstruction. - Ultrasound characteristics of enlarged lymph nodes noted in  the groin.  *See table(s) above for measurements and observations.    Preliminary      LOS: 2 days    Antonieta Pert, MD [facility]  05/10/2020, 2:50 PM

## 2020-05-10 NOTE — Consult Note (Addendum)
[place] Nurse wound follow up Please see previous note for assessment.  If patient discharge is imminent, I recommend switching to weekly dresisng if caregiver support is limited.  Please consider the following:  Continue Silvadene as ordered.  LEft arm:  Cleanse with NS and pat dry.  Aquacel AG to but cut into strips by bedside RN and placed into sterile bag.  Patient to pack strip of aquacel Ag into open wounds on left arm and cover with kerlix and tape.  Change weekly.  Please send home with the following:  3 packs Aquacel Ag cut into strips (3 cm in length)  Packs dry gauze.  Kerlix/tape.  Send home with q tips for packing wound. Bedside RN states patient has spouse at home, so original order of Iodoform is correct.   Will not follow at this time.  Please re-consult if needed.  Domenic Moras MSN, RN, FNP-BC CWON Wound, Ostomy, Continence Nurse Pager [phone]

## 2020-05-10 NOTE — TOC Progression Note (Signed)
Transition of Care [facility]) - Progression Note    Patient Details  Name: Marie Brewer MRN: [id number] Date of Birth: 1980/11/12  Transition of Care [facility]) CM/SW Contact  Joaquin Courts, RN Phone Number: 05/10/2020, 2:13 PM  Clinical Narrative:    Patient set up with [facility] to establish primary care and apply for financial assistance.  Patient also provided with [place] letter to assist with cost of prescriptions at discharge.  Patient notified of terms for the [facility] program and is aware that this can only be used once a year.    Expected Discharge Plan: Home/Self Care Barriers to Discharge: No Barriers Identified  Expected Discharge Plan and Services Expected Discharge Plan: Home/Self Care   Discharge Planning Services: CM Consult   Living arrangements for the past 2 months: Single Family Home                 DME Arranged: N/A         HH Arranged: NA HH Agency: NA         Social Determinants of Health (SDOH) Interventions    Readmission Risk Interventions No flowsheet data found.

## 2020-05-11 ENCOUNTER — Telehealth: Payer: Self-pay

## 2020-05-11 LAB — AEROBIC/ANAEROBIC CULTURE W GRAM STAIN (SURGICAL/DEEP WOUND): Culture: NO GROWTH

## 2020-05-11 MED FILL — LINEZOLID 600 MG TAB: 600 | 13 days supply | Qty: 26 | Fill #0

## 2020-05-11 NOTE — Discharge Summary (Signed)
Physician Discharge Summary  Lanie Schelling OIN:[id number] DOB: [birth year] DOA: 05/08/2020  PCP: Pcp, No  Admit date: 05/08/2020 Discharge date: 05/11/2020  Admitted From: home Disposition:  AMA  Recommendations for Outpatient Follow-up:  1. Follow up with PCP in 1-2 weeks 2. Please obtain BMP/CBC in one week 3. Please follow up on the following pending results:  Home Health:NO  Equipment/Devices: NONE  Discharge Condition: Stable Code Status:   Code Status: Prior Diet recommendation:  Diet Order    None      Brief/Interim Summary: 39 year old female with history of cervical cancer treated with chemoradiation 10 years ago, polysubstance abuse, morbid obesity was admitted on 10/5 for left arm swelling, right wrist swelling, lower extremity chronic cellulitis and wound, was seen by hand surgery underwent left forearm abscess drainage x2 and right wrist joint aspiration by Dr. Claudia Desanctis on 10/6, was seen by ID and was being treated with IV antibiotics, wound culture grew Streptococcus Pyogenes.But unfortunately patient left AGAINST MEDICAL ADVICE 05/07/2020 during the night. She returned to the ED 05/08/2020 to good treatment.  In the ED was hypokalemia, leukocytosis creatinine at baseline lactic acid 2.1.Vancomycin was restarted and admission was requested for management of her cellulitis hypokalemia and AKI. She is being treated second time with Zyvox. Arrangement was being made for her to have oral medication as well as dressing supplies for home and her electrolytes were being replaced however patient decided to leave [place] second time on 05/10/2020 despite explaining her risk of leaving [place] which includes worsening of infection that could lead to significant morbidity including death and seek disability.  However patient left AGAINST MEDICAL ADVICE I did send her prescription to [facility] I called [facility] 05/11/20 am and asked them to fill/provide her  with the meds ZYVOX and Rx was picked up at noon today- I called the patient phone no to confirm and let her know abt the upcoming appointment but phone is not picked up and unable to leave message.   Discharge Diagnoses:  Streptococcus pyogenes abscess/cellulitis of left forearm:SEPSIS Ruled out.  Blood culture negative. Polysubstance abuse  Hypokalemia severe Bilateral lower extremity cellulitis chronic skin changes Rt ankle pain AKI  Anemia Morbid obesity  Subjective: ama  Discharge Exam: Not done  Discharge Instructions   Allergies as of 05/10/2020      Reactions   Amoxicillin-pot Clavulanate Anaphylaxis   Imitrex [sumatriptan Base] Other (See Comments)   Heart races   Cephalexin Rash      Medication List    STOP taking these medications   ciprofloxacin 500 MG tablet Commonly known as: CIPRO   nitrofurantoin (macrocrystal-monohydrate) 100 MG capsule Commonly known as: MACROBID     TAKE these medications   ferrous sulfate 325 (65 FE) MG tablet Take 1 tablet (325 mg total) by mouth 2 (two) times daily with a meal.   ibuprofen 200 MG tablet Commonly known as: ADVIL Take 400 mg by mouth every 6 (six) hours as needed. For pain   linezolid 600 MG tablet Commonly known as: ZYVOX Take 1 tablet (600 mg total) by mouth 2 (two) times daily for 13 days. Continue through 10/24   silver sulfADIAZINE 1 % cream Commonly known as: SILVADENE Apply topically 2 (two) times daily. What changed:   how much to take  when to take this  additional instructions       Follow-up Information    [facility] Follow up.   Why: You have  an appointment on 07/15/20 at 150 to establish primary care.  The clinic will call you to discuss their financial assitance program. Contact information: [place] [phone]             Allergies  Allergen Reactions  . Amoxicillin-Pot Clavulanate  Anaphylaxis  . Imitrex [Sumatriptan Base] Other (See Comments)    Heart races  . Cephalexin Rash    The results of significant diagnostics from this hospitalization (including imaging, microbiology, ancillary and laboratory) are listed below for reference.    Microbiology: Recent Results (from the past 240 hour(s))  Blood culture (routine x 2)     Status: None   Collection Time: 05/05/20  1:43 AM   Specimen: BLOOD  Result Value Ref Range Status   Specimen Description   Final    BLOOD BLOOD LEFT WRIST Performed at [facility] [address] [place], [place] [address]    Special Requests   Final    BOTTLES DRAWN AEROBIC AND ANAEROBIC Blood Culture adequate volume Performed at [facility] [address], [place], [place] [address]    Culture   Final    NO GROWTH 5 DAYS Performed at [facility], [place] [address], [place],  [address]    Report Status 05/10/2020 FINAL  Final  Respiratory Panel by RT PCR (Flu A&B, Covid) - Nasopharyngeal Swab     Status: None   Collection Time: 05/05/20  2:57 AM   Specimen: Nasopharyngeal Swab  Result Value Ref Range Status   SARS Coronavirus 2 by RT PCR NEGATIVE NEGATIVE Final    Comment: (NOTE) SARS-CoV-2 target nucleic acids are NOT DETECTED.  The SARS-CoV-2 RNA is generally detectable in upper respiratoy specimens during the acute phase of infection. The lowest concentration of SARS-CoV-2 viral copies this assay can detect is 131 copies/mL. A negative result does not preclude SARS-Cov-2 infection and should not be used as the sole basis for treatment or other patient management decisions. A negative result may occur with  improper specimen collection/handling, submission of specimen other than nasopharyngeal swab, presence of viral mutation(s) within the areas targeted by this assay, and inadequate number of viral copies (<131 copies/mL). A negative result must be combined with  clinical observations, patient history, and epidemiological information. The expected result is Negative.  Fact Sheet for Patients:  PinkCheek.be  Fact Sheet for Healthcare Providers:  GravelBags.it  This test is no t yet approved or cleared by the [place] FDA and  has been authorized for detection and/or diagnosis of SARS-CoV-2 by FDA under an Emergency Use Authorization (EUA). This EUA will remain  in effect (meaning this test can be used) for the duration of the COVID-19 declaration under Section 564(b)(1) of the Act, 21 U.S.C. section 360bbb-3(b)(1), unless the authorization is terminated or revoked sooner.     Influenza A by PCR NEGATIVE NEGATIVE Final   Influenza B by PCR NEGATIVE NEGATIVE Final    Comment: (NOTE) The Xpert Xpress SARS-CoV-2/FLU/RSV assay is intended as an aid in  the diagnosis of influenza from Nasopharyngeal swab specimens and  should not be used as a sole basis for treatment. Nasal washings and  aspirates are unacceptable for Xpert Xpress SARS-CoV-2/FLU/RSV  testing.  Fact Sheet for Patients: PinkCheek.be  Fact Sheet for Healthcare Providers: GravelBags.it  This test is not yet approved or cleared by the [place] FDA and  has been authorized for detection and/or diagnosis of SARS-CoV-2 by  FDA  under an Emergency Use Authorization (EUA). This EUA will remain  in effect (meaning this test can be used) for the duration of the  Covid-19 declaration under Section 564(b)(1) of the Act, 21  U.S.C. section 360bbb-3(b)(1), unless the authorization is  terminated or revoked. Performed at [facility], [place] [address] [place] [address] [place], [place] [address]   Blood culture (routine x 2)     Status: None   Collection Time: 05/05/20  9:17 AM   Specimen: BLOOD  Result Value Ref Range Status   Specimen Description   Final     BLOOD BLOOD RIGHT FOREARM Performed at [facility] [address], [place], [place] [address]    Special Requests   Final    BOTTLES DRAWN AEROBIC AND ANAEROBIC Blood Culture adequate volume Performed at [facility] [address] [place], [place] [address]    Culture   Final    NO GROWTH 5 DAYS Performed at [facility], [place] [address] [place], [place] [address]    Report Status 05/10/2020 FINAL  Final  Aerobic/Anaerobic Culture (surgical/deep wound)     Status: None   Collection Time: 05/05/20  5:51 PM   Specimen: Joint, Other; Body Fluid  Result Value Ref Range Status   Specimen Description   Final    SYNOVIAL RIGHT WRIST Performed at [facility] [address], [place], [place] [address]    Special Requests   Final    NONE Performed at [facility], [place] [address] [place], [place] [address]    Gram Stain   Final    RARE WBC PRESENT, PREDOMINANTLY PMN NO ORGANISMS SEEN    Culture   Final    No growth aerobically or anaerobically. Performed at [facility], [place] [address] [place], [place] [address]    Report Status 05/11/2020 FINAL  Final  Aerobic/Anaerobic Culture (surgical/deep wound)     Status: None   Collection Time: 05/05/20  5:53 PM   Specimen: Wound  Result Value Ref Range Status   Specimen Description   Final    ABSCESS LEFT FOREARM 1 Performed at [facility] [address] [place], [place] [address]    Special Requests   Final    NONE Performed at [facility], [place] [address] [place], [place] [address]    Gram Stain   Final    ABUNDANT WBC PRESENT, PREDOMINANTLY PMN ABUNDANT GRAM POSITIVE COCCI    Culture   Final    RARE GROUP A STREP (S.PYOGENES) ISOLATED Beta hemolytic streptococci are predictably susceptible to penicillin and other beta lactams. Susceptibility testing not routinely performed. NO ANAEROBES  ISOLATED Performed at [facility], [place] [address] [place], [place] [address]    Report Status 05/11/2020 FINAL  Final  Aerobic/Anaerobic Culture (surgical/deep wound)     Status: None   Collection Time: 05/05/20  6:04 PM   Specimen: Wound  Result Value Ref Range Status   Specimen Description   Final    ABSCESS LEFT FOREARM 2 Performed at [facility] [address], [place], [place] [address]    Special Requests   Final    NONE Performed at [facility], [place] [address], [place], [place] [address]    Gram Stain   Final    ABUNDANT WBC PRESENT, PREDOMINANTLY PMN ABUNDANT GRAM POSITIVE COCCI    Culture   Final    RARE GROUP A  STREP (S.PYOGENES) ISOLATED Beta hemolytic streptococci are predictably susceptible to penicillin and other beta lactams. Susceptibility testing not routinely performed. NO ANAEROBES ISOLATED Performed at [facility], [place] [address], [place], [place] [address]    Report Status 05/11/2020 FINAL  Final  Respiratory Panel by RT PCR (Flu A&B, Covid) - Nasopharyngeal Swab     Status: None   Collection Time: 05/08/20  7:09 PM   Specimen: Nasopharyngeal Swab  Result Value Ref Range Status   SARS Coronavirus 2 by RT PCR NEGATIVE NEGATIVE Final    Comment: (NOTE) SARS-CoV-2 target nucleic acids are NOT DETECTED.  The SARS-CoV-2 RNA is generally detectable in upper respiratoy specimens during the acute phase of infection. The lowest concentration of SARS-CoV-2 viral copies this assay can detect is 131 copies/mL. A negative result does not preclude SARS-Cov-2 infection and should not be used as the sole basis for treatment or other patient management decisions. A negative result may occur with  improper specimen collection/handling, submission of specimen other than nasopharyngeal swab, presence of viral mutation(s) within the areas targeted by this assay, and inadequate number of viral copies (<131 copies/mL).  A negative result must be combined with clinical observations, patient history, and epidemiological information. The expected result is Negative.  Fact Sheet for Patients:  PinkCheek.be  Fact Sheet for Healthcare Providers:  GravelBags.it  This test is no t yet approved or cleared by the [place] FDA and  has been authorized for detection and/or diagnosis of SARS-CoV-2 by FDA under an Emergency Use Authorization (EUA). This EUA will remain  in effect (meaning this test can be used) for the duration of the COVID-19 declaration under Section 564(b)(1) of the Act, 21 U.S.C. section 360bbb-3(b)(1), unless the authorization is terminated or revoked sooner.     Influenza A by PCR NEGATIVE NEGATIVE Final   Influenza B by PCR NEGATIVE NEGATIVE Final    Comment: (NOTE) The Xpert Xpress SARS-CoV-2/FLU/RSV assay is intended as an aid in  the diagnosis of influenza from Nasopharyngeal swab specimens and  should not be used as a sole basis for treatment. Nasal washings and  aspirates are unacceptable for Xpert Xpress SARS-CoV-2/FLU/RSV  testing.  Fact Sheet for Patients: PinkCheek.be  Fact Sheet for Healthcare Providers: GravelBags.it  This test is not yet approved or cleared by the [place] FDA and  has been authorized for detection and/or diagnosis of SARS-CoV-2 by  FDA under an Emergency Use Authorization (EUA). This EUA will remain  in effect (meaning this test can be used) for the duration of the  Covid-19 declaration under Section 564(b)(1) of the Act, 21  U.S.C. section 360bbb-3(b)(1), unless the authorization is  terminated or revoked. Performed at [facility], [place] [address] [place], [place] [address]   Culture, blood (x 2)     Status: None (Preliminary result)   Collection Time: 05/08/20  9:49 PM   Specimen: BLOOD LEFT HAND   Result Value Ref Range Status   Specimen Description   Final    BLOOD LEFT HAND Performed at [facility] [address], [place], [place] [address]    Special Requests   Final    BOTTLES DRAWN AEROBIC AND ANAEROBIC Blood Culture results may not be optimal due to an inadequate volume of blood received in culture bottles Performed at [facility] [address] [place], [place] [address]    Culture   Final    NO GROWTH 2 DAYS Performed at [facility], [address], [place], [place] [address]    Report Status PENDING  Incomplete  Culture, blood (x 2)     Status: None (Preliminary result)   Collection Time: 05/08/20  9:54 PM   Specimen: Right Antecubital; Blood  Result Value Ref Range Status   Specimen Description   Final    RIGHT ANTECUBITAL Performed at [facility] [address] [place], [place] [address]    Special Requests   Final    BOTTLES DRAWN AEROBIC AND ANAEROBIC Blood Culture results may not be optimal due to an inadequate volume of blood received in culture bottles Performed at [facility] [address], [place], [place] [address]    Culture   Final    NO GROWTH 2 DAYS Performed at [facility] [address] [place], [place] [address]    Report Status PENDING  Incomplete  MRSA PCR Screening     Status: None   Collection Time: 05/09/20 12:07 AM   Specimen: Nasopharyngeal  Result Value Ref Range Status   MRSA by PCR NEGATIVE NEGATIVE Final    Comment:        The GeneXpert MRSA Assay (FDA approved for NASAL specimens only), is one component of a comprehensive MRSA colonization surveillance program. It is not intended to diagnose MRSA infection nor to guide or monitor treatment for MRSA infections. Performed at [facility], [place] [address], [place], [place] [address]     Procedures/Studies: Darletta Moll Wrist Complete Right  Result Date:  05/04/2020 CLINICAL DATA:  Pain EXAM: RIGHT WRIST - COMPLETE 3+ VIEW COMPARISON:  None. FINDINGS: There is no evidence of fracture or dislocation. There is no evidence of arthropathy or other focal bone abnormality. Soft tissues are unremarkable. IMPRESSION: Negative. Electronically Signed   By: Constance Holster M.D.   On: 05/04/2020 23:05   DG Tibia/Fibula Left  Result Date: 05/09/2020 CLINICAL DATA:  Bilateral lower extremity chronic cellulitis with edema and skin changes. EXAM: LEFT TIBIA AND FIBULA - 2 VIEW COMPARISON:  None. FINDINGS: The mineralization and alignment are normal. There is no evidence of acute fracture or dislocation. There is smooth periosteal thickening along the distal tibial and fibular shafts. Minimal degenerative changes are present at the ankle. The soft tissues appear diffusely edematous. Possible mild skin ulceration laterally in the distal lower leg. No soft tissue emphysema, foreign body or calcification identified. IMPRESSION: 1. No acute osseous findings. 2. Diffuse soft tissue edema with possible mild skin ulceration laterally in the distal lower leg. No evidence of soft tissue emphysema or osteomyelitis. 3. Smooth periosteal thickening in the distal tibia and fibula, likely due to chronic venous stasis. Electronically Signed   By: Richardean Sale M.D.   On: 05/09/2020 13:52   DG Tibia/Fibula Right  Result Date: 05/09/2020 CLINICAL DATA:  Bilateral lower extremity chronic cellulitis with edema and skin changes. EXAM: RIGHT TIBIA AND FIBULA - 2 VIEW COMPARISON:  Right foot radiographs 05/04/2020 FINDINGS: The mineralization and alignment are normal. There is no evidence of acute fracture or dislocation. Mild degenerative changes at the knee and ankle. Mild diffuse soft tissue edema without evidence of foreign body or soft tissue emphysema. IMPRESSION: No acute osseous findings. Mild diffuse soft tissue edema. Electronically Signed   By: Richardean Sale M.D.   On:  05/09/2020 13:53   DG Foot 2 Views Left  Result Date: 05/09/2020 CLINICAL DATA:  Lower extremity cellulitis and edema. EXAM: LEFT FOOT - 2 VIEW COMPARISON:  None. FINDINGS: Normal anatomic alignment. No evidence for acute fracture or dislocation. Regional soft tissues unremarkable. IMPRESSION: No acute osseous abnormality. Electronically Signed   By: Lovey Newcomer M.D.   On: 05/09/2020 13:49   DG Foot Complete Right  Result Date: 05/04/2020 CLINICAL DATA:  Pain and swelling EXAM: RIGHT FOOT COMPLETE - 3+ VIEW COMPARISON:  None. FINDINGS: There is no evidence of fracture or dislocation. There are degenerative changes of the midfoot. There is a large plantar calcaneal spur. There is soft tissue swelling about the foot. IMPRESSION: Soft tissue swelling without evidence of an acute fracture or dislocation. Electronically Signed   By: Constance Holster M.D.   On: 05/04/2020 23:06   VAS [place] LOWER EXTREMITY VENOUS (DVT)  Result Date: 05/10/2020  Lower Venous DVTStudy Indications: Swelling.  Comparison Study: No prior study Performing Technologist: Maudry Mayhew MHA, RDMS, RVT, RDCS  Examination Guidelines: A complete evaluation includes B-mode imaging, spectral Doppler, color Doppler, and power Doppler as needed of all accessible portions of each vessel. Bilateral testing is considered an integral part of a complete examination. Limited examinations for reoccurring indications may be performed as noted. The reflux portion of the exam is performed with the patient in reverse Trendelenburg.  +---------+---------------+---------+-----------+----------+--------------+ RIGHT    CompressibilityPhasicitySpontaneityPropertiesThrombus Aging +---------+---------------+---------+-----------+----------+--------------+ CFV      Full           Yes      Yes                                 +---------+---------------+---------+-----------+----------+--------------+ SFJ      Full                                                         +---------+---------------+---------+-----------+----------+--------------+ FV Prox  Full                                                        +---------+---------------+---------+-----------+----------+--------------+ FV Mid   Full                                                        +---------+---------------+---------+-----------+----------+--------------+ FV DistalFull                                                        +---------+---------------+---------+-----------+----------+--------------+ PFV      Full                                                        +---------+---------------+---------+-----------+----------+--------------+ POP      Full           Yes  Yes                                 +---------+---------------+---------+-----------+----------+--------------+ PTV      Full                                                        +---------+---------------+---------+-----------+----------+--------------+ PERO     Full                                                        +---------+---------------+---------+-----------+----------+--------------+   +----+---------------+---------+-----------+----------+--------------+ LEFTCompressibilityPhasicitySpontaneityPropertiesThrombus Aging +----+---------------+---------+-----------+----------+--------------+ CFV Full           Yes      Yes                                 +----+---------------+---------+-----------+----------+--------------+     Summary: RIGHT: - There is no evidence of deep vein thrombosis in the lower extremity.  - No cystic structure found in the popliteal fossa. - Ultrasound characteristics of enlarged lymph nodes are noted in the groin.  LEFT: - No evidence of common femoral vein obstruction. - Ultrasound characteristics of enlarged lymph nodes noted in the groin.  *See table(s) above for measurements and observations. Electronically  signed by Monica Martinez MD on 05/10/2020 at 5:14:25 PM.    Final      Labs: BNP (last 3 results) No results for input(s): BNP in the last 8760 hours. Basic Metabolic Panel: Recent Labs  Lab 05/05/20 0143 05/05/20 0917 05/06/20 0527 05/06/20 0527 05/07/20 0432 05/08/20 1909 05/09/20 0500 05/10/20 0529 05/10/20 1404  NA  --    < > 134*  --  134* 137 132* 136  --   K  --    < > 3.6   < > 3.5 2.8* 3.1* 2.9* 3.2*  CL  --    < > 100  --  102 100 100 103  --   CO2  --    < > 25  --  25 24 23 25   --   GLUCOSE  --    < > 90  --  97 123* 98 108*  --   BUN  --    < > 11  --  14 14 12 12   --   CREATININE  --    < > 0.60  --  1.03* 1.31* 1.09* 1.09*  --   CALCIUM  --    < > 8.1*  --  7.8* 8.1* 7.4* 7.6*  --   MG 1.5*  --  2.2  --   --   --  1.9  --   --   PHOS  --   --  3.4  --   --   --  5.3*  --   --    < > = values in this interval not displayed.   Liver Function Tests: Recent Labs  Lab 05/09/20 0500  AST 17  ALT 12  ALKPHOS 130*  BILITOT 0.7  PROT 7.9  ALBUMIN 2.0*   No results for  input(s): LIPASE, AMYLASE in the last 168 hours. No results for input(s): AMMONIA in the last 168 hours. CBC: Recent Labs  Lab 05/04/20 2338 05/05/20 0917 05/06/20 0527 05/07/20 0432 05/08/20 1909 05/09/20 0500 05/10/20 0529  WBC 16.4*   < > 19.5* 17.0* 15.6* 15.4* 14.9*  NEUTROABS 14.7*  --   --   --  11.2* 11.0*  --   HGB 10.5*   < > 9.2* 8.7* 8.9* 8.4* 7.7*  HCT 31.9*   < > 28.8* 27.3* 28.1* 26.6* 25.3*  MCV 85.3   < > 86.7 87.5 86.5 86.9 89.4  PLT 262   < > 245 278 324 309 306   < > = values in this interval not displayed.   Cardiac Enzymes: No results for input(s): CKTOTAL, CKMB, CKMBINDEX, TROPONINI in the last 168 hours. BNP: Invalid input(s): POCBNP CBG: No results for input(s): GLUCAP in the last 168 hours. D-Dimer No results for input(s): DDIMER in the last 72 hours. Hgb A1c No results for input(s): HGBA1C in the last 72 hours. Lipid Profile No results for  input(s): CHOL, HDL, LDLCALC, TRIG, CHOLHDL, LDLDIRECT in the last 72 hours. Thyroid function studies Recent Labs    05/09/20 0500  TSH 3.236   Anemia work up Recent Labs    05/09/20 0500  VITAMINB12 579  FOLATE 11.9  FERRITIN 417*  TIBC 147*  IRON 25*  RETICCTPCT 1.4   Urinalysis    Component Value Date/Time   COLORURINE YELLOW 05/08/2020 2158   APPEARANCEUR CLEAR 05/08/2020 2158   LABSPEC 1.013 05/08/2020 2158   PHURINE 5.0 05/08/2020 2158   GLUCOSEU NEGATIVE 05/08/2020 2158   HGBUR SMALL (A) 05/08/2020 2158   BILIRUBINUR NEGATIVE 05/08/2020 2158   KETONESUR NEGATIVE 05/08/2020 2158   PROTEINUR NEGATIVE 05/08/2020 2158   UROBILINOGEN 0.2 10/09/2008 1457   NITRITE NEGATIVE 05/08/2020 2158   LEUKOCYTESUR NEGATIVE 05/08/2020 2158   Sepsis Labs Invalid input(s): PROCALCITONIN,  WBC,  LACTICIDVEN Microbiology Recent Results (from the past 240 hour(s))  Blood culture (routine x 2)     Status: None   Collection Time: 05/05/20  1:43 AM   Specimen: BLOOD  Result Value Ref Range Status   Specimen Description   Final    BLOOD BLOOD LEFT WRIST Performed at [facility], [place] [address] [place], [place] [address]    Special Requests   Final    BOTTLES DRAWN AEROBIC AND ANAEROBIC Blood Culture adequate volume Performed at [facility] [address] [place], [place] [address]    Culture   Final    NO GROWTH 5 DAYS Performed at [facility], [place] [address], [place], [place] [address]    Report Status 05/10/2020 FINAL  Final  Respiratory Panel by RT PCR (Flu A&B, Covid) - Nasopharyngeal Swab     Status: None   Collection Time: 05/05/20  2:57 AM   Specimen: Nasopharyngeal Swab  Result Value Ref Range Status   SARS Coronavirus 2 by RT PCR NEGATIVE NEGATIVE Final    Comment: (NOTE) SARS-CoV-2 target nucleic acids are NOT DETECTED.  The SARS-CoV-2 RNA is generally detectable in upper respiratoy specimens during the acute  phase of infection. The lowest concentration of SARS-CoV-2 viral copies this assay can detect is 131 copies/mL. A negative result does not preclude SARS-Cov-2 infection and should not be used as the sole basis for treatment or other patient management decisions. A negative result may occur with  improper specimen collection/handling, submission of specimen other than nasopharyngeal  swab, presence of viral mutation(s) within the areas targeted by this assay, and inadequate number of viral copies (<131 copies/mL). A negative result must be combined with clinical observations, patient history, and epidemiological information. The expected result is Negative.  Fact Sheet for Patients:  PinkCheek.be  Fact Sheet for Healthcare Providers:  GravelBags.it  This test is no t yet approved or cleared by the [place] FDA and  has been authorized for detection and/or diagnosis of SARS-CoV-2 by FDA under an Emergency Use Authorization (EUA). This EUA will remain  in effect (meaning this test can be used) for the duration of the COVID-19 declaration under Section 564(b)(1) of the Act, 21 U.S.C. section 360bbb-3(b)(1), unless the authorization is terminated or revoked sooner.     Influenza A by PCR NEGATIVE NEGATIVE Final   Influenza B by PCR NEGATIVE NEGATIVE Final    Comment: (NOTE) The Xpert Xpress SARS-CoV-2/FLU/RSV assay is intended as an aid in  the diagnosis of influenza from Nasopharyngeal swab specimens and  should not be used as a sole basis for treatment. Nasal washings and  aspirates are unacceptable for Xpert Xpress SARS-CoV-2/FLU/RSV  testing.  Fact Sheet for Patients: PinkCheek.be  Fact Sheet for Healthcare Providers: GravelBags.it  This test is not yet approved or cleared by the [place] FDA and  has been authorized for detection and/or diagnosis of  SARS-CoV-2 by  FDA under an Emergency Use Authorization (EUA). This EUA will remain  in effect (meaning this test can be used) for the duration of the  Covid-19 declaration under Section 564(b)(1) of the Act, 21  U.S.C. section 360bbb-3(b)(1), unless the authorization is  terminated or revoked. Performed at [facility], [place] [address], [place], [place] [address]   Blood culture (routine x 2)     Status: None   Collection Time: 05/05/20  9:17 AM   Specimen: BLOOD  Result Value Ref Range Status   Specimen Description   Final    BLOOD BLOOD RIGHT FOREARM Performed at [facility] [address], [place], [place] [address]    Special Requests   Final    BOTTLES DRAWN AEROBIC AND ANAEROBIC Blood Culture adequate volume Performed at [facility] [address], [place], [place] [address]    Culture   Final    NO GROWTH 5 DAYS Performed at [facility], [place] [address], [place], [place] [address]    Report Status 05/10/2020 FINAL  Final  Aerobic/Anaerobic Culture (surgical/deep wound)     Status: None   Collection Time: 05/05/20  5:51 PM   Specimen: Joint, Other; Body Fluid  Result Value Ref Range Status   Specimen Description   Final    SYNOVIAL RIGHT WRIST Performed at [facility] [address], [place], [place] [address]    Special Requests   Final    NONE Performed at [facility] [place] [address], [place], [place] [address]    Gram Stain   Final    RARE WBC PRESENT, PREDOMINANTLY PMN NO ORGANISMS SEEN    Culture   Final    No growth aerobically or anaerobically. Performed at [facility], [place] [address], [place], [place] [address]    Report Status 05/11/2020 FINAL  Final  Aerobic/Anaerobic Culture (surgical/deep wound)     Status: None   Collection Time: 05/05/20  5:53 PM   Specimen: Wound  Result Value Ref Range Status   Specimen Description   Final  ABSCESS LEFT FOREARM 1 Performed at [facility] [address], [place], [place] [address]    Special Requests   Final    NONE Performed at [facility], [place] [address] [place], [place] [address]    Gram Stain   Final    ABUNDANT WBC PRESENT, PREDOMINANTLY PMN ABUNDANT GRAM POSITIVE COCCI    Culture   Final    RARE GROUP A STREP (S.PYOGENES) ISOLATED Beta hemolytic streptococci are predictably susceptible to penicillin and other beta lactams. Susceptibility testing not routinely performed. NO ANAEROBES ISOLATED Performed at [facility], [place] [address], [place], [place] [address]    Report Status 05/11/2020 FINAL  Final  Aerobic/Anaerobic Culture (surgical/deep wound)     Status: None   Collection Time: 05/05/20  6:04 PM   Specimen: Wound  Result Value Ref Range Status   Specimen Description   Final    ABSCESS LEFT FOREARM 2 Performed at [facility] [address], [place], [place] [address]    Special Requests   Final    NONE Performed at [facility], [place] [address], [place], [place] [address]    Gram Stain   Final    ABUNDANT WBC PRESENT, PREDOMINANTLY PMN ABUNDANT GRAM POSITIVE COCCI    Culture   Final    RARE GROUP A STREP (S.PYOGENES) ISOLATED Beta hemolytic streptococci are predictably susceptible to penicillin and other beta lactams. Susceptibility testing not routinely performed. NO ANAEROBES ISOLATED Performed at [facility], [place] [address], [place], [place] [address]    Report Status 05/11/2020 FINAL  Final  Respiratory Panel by RT PCR (Flu A&B, Covid) - Nasopharyngeal Swab     Status: None   Collection Time: 05/08/20  7:09 PM   Specimen: Nasopharyngeal Swab  Result Value Ref Range Status   SARS Coronavirus 2 by RT PCR NEGATIVE NEGATIVE Final    Comment: (NOTE) SARS-CoV-2 target nucleic acids are NOT DETECTED.  The SARS-CoV-2 RNA is generally detectable in upper  respiratoy specimens during the acute phase of infection. The lowest concentration of SARS-CoV-2 viral copies this assay can detect is 131 copies/mL. A negative result does not preclude SARS-Cov-2 infection and should not be used as the sole basis for treatment or other patient management decisions. A negative result may occur with  improper specimen collection/handling, submission of specimen other than nasopharyngeal swab, presence of viral mutation(s) within the areas targeted by this assay, and inadequate number of viral copies (<131 copies/mL). A negative result must be combined with clinical observations, patient history, and epidemiological information. The expected result is Negative.  Fact Sheet for Patients:  PinkCheek.be  Fact Sheet for Healthcare Providers:  GravelBags.it  This test is no t yet approved or cleared by the [place] FDA and  has been authorized for detection and/or diagnosis of SARS-CoV-2 by FDA under an Emergency Use Authorization (EUA). This EUA will remain  in effect (meaning this test can be used) for the duration of the COVID-19 declaration under Section 564(b)(1) of the Act, 21 U.S.C. section 360bbb-3(b)(1), unless the authorization is terminated or revoked sooner.     Influenza A by PCR NEGATIVE NEGATIVE Final   Influenza B by PCR NEGATIVE NEGATIVE Final    Comment: (NOTE) The Xpert Xpress SARS-CoV-2/FLU/RSV assay is intended as an aid in  the diagnosis of influenza from Nasopharyngeal swab specimens and  should not be used as a sole basis for treatment. Nasal washings and  aspirates are  unacceptable for Xpert Xpress SARS-CoV-2/FLU/RSV  testing.  Fact Sheet for Patients: PinkCheek.be  Fact Sheet for Healthcare Providers: GravelBags.it  This test is not yet approved or cleared by the [place] FDA and  has been  authorized for detection and/or diagnosis of SARS-CoV-2 by  FDA under an Emergency Use Authorization (EUA). This EUA will remain  in effect (meaning this test can be used) for the duration of the  Covid-19 declaration under Section 564(b)(1) of the Act, 21  U.S.C. section 360bbb-3(b)(1), unless the authorization is  terminated or revoked. Performed at [facility], [place] [address] [place], [place] [address]   Culture, blood (x 2)     Status: None (Preliminary result)   Collection Time: 05/08/20  9:49 PM   Specimen: BLOOD LEFT HAND  Result Value Ref Range Status   Specimen Description   Final    BLOOD LEFT HAND Performed at [facility] [address], [place], [place] [address]    Special Requests   Final    BOTTLES DRAWN AEROBIC AND ANAEROBIC Blood Culture results may not be optimal due to an inadequate volume of blood received in culture bottles Performed at [facility] [address] [place], [place] [address]    Culture   Final    NO GROWTH 2 DAYS Performed at [facility] [address] [place], [place] [address]    Report Status PENDING  Incomplete  Culture, blood (x 2)     Status: None (Preliminary result)   Collection Time: 05/08/20  9:54 PM   Specimen: Right Antecubital; Blood  Result Value Ref Range Status   Specimen Description   Final    RIGHT ANTECUBITAL Performed at [facility] [address] [place], [place] [address]    Special Requests   Final    BOTTLES DRAWN AEROBIC AND ANAEROBIC Blood Culture results may not be optimal due to an inadequate volume of blood received in culture bottles Performed at [facility] [address] [place], [place] [address]    Culture   Final    NO GROWTH 2 DAYS Performed at [facility] [address], [place],  [address]    Report Status PENDING  Incomplete  MRSA PCR Screening     Status: None    Collection Time: 05/09/20 12:07 AM   Specimen: Nasopharyngeal  Result Value Ref Range Status   MRSA by PCR NEGATIVE NEGATIVE Final    Comment:        The GeneXpert MRSA Assay (FDA approved for NASAL specimens only), is one component of a comprehensive MRSA colonization surveillance program. It is not intended to diagnose MRSA infection nor to guide or monitor treatment for MRSA infections. Performed at [facility], [place] [address], [place],  [address]      SIGNED: Antonieta Pert, MD  [facility] 05/11/2020, 3:43 PM  If 7PM-7AM, please contact night-coverage [email or web address]

## 2020-05-11 NOTE — Telephone Encounter (Signed)
I return Pt call, unable to reach her or to LVM, Pt need to see the PCP first before she can apply for the [facility] financial program that the [facility] that is the policy that the clinic has

## 2020-05-11 NOTE — Telephone Encounter (Signed)
Copied from [place] [id number]. Topic: Appointment Scheduling - Scheduling Inquiry for Clinic >> May 10, 2020  1:38 PM Alanda Slim E wrote: Reason for CRM: Pt has an appt to establish care in December but needs an appt with carlos for financial assistance/ please advise

## 2020-05-14 LAB — CULTURE, BLOOD (ROUTINE X 2)
Culture: NO GROWTH
Culture: NO GROWTH

## 2020-05-25 ENCOUNTER — Inpatient Hospital Stay: Payer: Self-pay | Admitting: Internal Medicine

## 2020-05-28 ENCOUNTER — Inpatient Hospital Stay: Payer: Self-pay | Admitting: Internal Medicine

## 2020-07-05 ENCOUNTER — Telehealth: Payer: Self-pay

## 2020-07-05 NOTE — Telephone Encounter (Signed)
Called patient to let them know that appointment was cancelled due to Dr. Chapman Fitch no longer being at our clinic and needing to reschedule. Call could not be completed.

## 2020-07-15 ENCOUNTER — Ambulatory Visit: Payer: Self-pay | Admitting: Family Medicine

## 2020-08-14 ENCOUNTER — Other Ambulatory Visit: Payer: Self-pay

## 2020-08-14 ENCOUNTER — Encounter (HOSPITAL_COMMUNITY): Payer: Self-pay | Admitting: *Deleted

## 2020-08-14 ENCOUNTER — Inpatient Hospital Stay (HOSPITAL_COMMUNITY): Payer: Self-pay

## 2020-08-14 ENCOUNTER — Inpatient Hospital Stay (HOSPITAL_COMMUNITY)
Admission: EM | Admit: 2020-08-14 | Discharge: 2020-08-22 | DRG: 988 | Disposition: A | Discharge status: Home Health | Payer: Self-pay | Attending: Internal Medicine | Admitting: Internal Medicine

## 2020-08-14 DIAGNOSIS — D649 Anemia, unspecified: Secondary | ICD-10-CM

## 2020-08-14 DIAGNOSIS — J982 Interstitial emphysema: Secondary | ICD-10-CM | POA: Diagnosis present

## 2020-08-14 DIAGNOSIS — Z923 Personal history of irradiation: Secondary | ICD-10-CM

## 2020-08-14 DIAGNOSIS — R112 Nausea with vomiting, unspecified: Secondary | ICD-10-CM | POA: Diagnosis present

## 2020-08-14 DIAGNOSIS — Z888 Allergy status to other drugs, medicaments and biological substances status: Secondary | ICD-10-CM

## 2020-08-14 DIAGNOSIS — G43909 Migraine, unspecified, not intractable, without status migrainosus: Secondary | ICD-10-CM | POA: Diagnosis present

## 2020-08-14 DIAGNOSIS — Z66 Do not resuscitate: Secondary | ICD-10-CM | POA: Diagnosis not present

## 2020-08-14 DIAGNOSIS — Z881 Allergy status to other antibiotic agents status: Secondary | ICD-10-CM

## 2020-08-14 DIAGNOSIS — N136 Pyonephrosis: Secondary | ICD-10-CM | POA: Diagnosis present

## 2020-08-14 DIAGNOSIS — Z89021 Acquired absence of right finger(s): Secondary | ICD-10-CM

## 2020-08-14 DIAGNOSIS — Z9221 Personal history of antineoplastic chemotherapy: Secondary | ICD-10-CM

## 2020-08-14 DIAGNOSIS — D638 Anemia in other chronic diseases classified elsewhere: Secondary | ICD-10-CM | POA: Diagnosis present

## 2020-08-14 DIAGNOSIS — R5381 Other malaise: Secondary | ICD-10-CM | POA: Diagnosis present

## 2020-08-14 DIAGNOSIS — C9000 Multiple myeloma not having achieved remission: Secondary | ICD-10-CM | POA: Diagnosis present

## 2020-08-14 DIAGNOSIS — Z8541 Personal history of malignant neoplasm of cervix uteri: Secondary | ICD-10-CM

## 2020-08-14 DIAGNOSIS — F1110 Opioid abuse, uncomplicated: Secondary | ICD-10-CM | POA: Diagnosis present

## 2020-08-14 DIAGNOSIS — E871 Hypo-osmolality and hyponatremia: Secondary | ICD-10-CM | POA: Diagnosis present

## 2020-08-14 DIAGNOSIS — F1910 Other psychoactive substance abuse, uncomplicated: Secondary | ICD-10-CM | POA: Diagnosis present

## 2020-08-14 DIAGNOSIS — B1920 Unspecified viral hepatitis C without hepatic coma: Secondary | ICD-10-CM | POA: Diagnosis present

## 2020-08-14 DIAGNOSIS — D539 Nutritional anemia, unspecified: Principal | ICD-10-CM | POA: Diagnosis present

## 2020-08-14 DIAGNOSIS — Z419 Encounter for procedure for purposes other than remedying health state, unspecified: Secondary | ICD-10-CM

## 2020-08-14 DIAGNOSIS — E872 Acidosis: Secondary | ICD-10-CM | POA: Diagnosis present

## 2020-08-14 DIAGNOSIS — N179 Acute kidney failure, unspecified: Secondary | ICD-10-CM | POA: Diagnosis present

## 2020-08-14 DIAGNOSIS — R197 Diarrhea, unspecified: Secondary | ICD-10-CM | POA: Diagnosis present

## 2020-08-14 DIAGNOSIS — Z20822 Contact with and (suspected) exposure to covid-19: Secondary | ICD-10-CM | POA: Diagnosis present

## 2020-08-14 DIAGNOSIS — E860 Dehydration: Secondary | ICD-10-CM | POA: Diagnosis present

## 2020-08-14 DIAGNOSIS — E538 Deficiency of other specified B group vitamins: Secondary | ICD-10-CM | POA: Diagnosis present

## 2020-08-14 DIAGNOSIS — F17200 Nicotine dependence, unspecified, uncomplicated: Secondary | ICD-10-CM | POA: Diagnosis present

## 2020-08-14 DIAGNOSIS — E876 Hypokalemia: Secondary | ICD-10-CM | POA: Diagnosis present

## 2020-08-14 DIAGNOSIS — Z6839 Body mass index (BMI) 39.0-39.9, adult: Secondary | ICD-10-CM

## 2020-08-14 LAB — CBC
HCT: 20.6 % — ABNORMAL LOW (ref 36.0–46.0)
HCT: 15 % — ABNORMAL LOW (ref 36.0–46.0)
Hemoglobin: 6.4 g/dL — CL (ref 12.0–15.0)
Hemoglobin: 4.6 g/dL — CL (ref 12.0–15.0)
MCH: 28.2 pg (ref 26.0–34.0)
MCH: 28 pg (ref 26.0–34.0)
MCHC: 31.1 g/dL (ref 30.0–36.0)
MCHC: 30.7 g/dL (ref 30.0–36.0)
MCV: 90.7 fL (ref 80.0–100.0)
MCV: 91.5 fL (ref 80.0–100.0)
Platelets: 348 10*3/uL (ref 150–400)
Platelets: 343 10*3/uL (ref 150–400)
RBC: 2.27 MIL/uL — ABNORMAL LOW (ref 3.87–5.11)
RBC: 1.64 MIL/uL — ABNORMAL LOW (ref 3.87–5.11)
RDW: 15.4 % (ref 11.5–15.5)
RDW: 15.4 % (ref 11.5–15.5)
WBC: 15.7 10*3/uL — ABNORMAL HIGH (ref 4.0–10.5)
WBC: 16.7 10*3/uL — ABNORMAL HIGH (ref 4.0–10.5)
nRBC: 0 % (ref 0.0–0.2)
nRBC: 0 % (ref 0.0–0.2)

## 2020-08-14 LAB — COMPREHENSIVE METABOLIC PANEL
ALT: 9 U/L (ref 0–44)
AST: 11 U/L — ABNORMAL LOW (ref 15–41)
Albumin: 2.6 g/dL — ABNORMAL LOW (ref 3.5–5.0)
Alkaline Phosphatase: 111 U/L (ref 38–126)
Anion gap: 20 — ABNORMAL HIGH (ref 5–15)
BUN: 114 mg/dL — ABNORMAL HIGH (ref 6–20)
CO2: 24 mmol/L (ref 22–32)
Calcium: 7.3 mg/dL — ABNORMAL LOW (ref 8.9–10.3)
Chloride: 87 mmol/L — ABNORMAL LOW (ref 98–111)
Creatinine, Ser: 10.73 mg/dL — ABNORMAL HIGH (ref 0.44–1.00)
GFR, Estimated: 4 mL/min — ABNORMAL LOW (ref >60)
Glucose, Bld: 102 mg/dL — ABNORMAL HIGH (ref 70–99)
Potassium: 3.1 mmol/L — ABNORMAL LOW (ref 3.5–5.1)
Sodium: 131 mmol/L — ABNORMAL LOW (ref 135–145)
Total Bilirubin: 0.8 mg/dL (ref 0.3–1.2)
Total Protein: 8.8 g/dL — ABNORMAL HIGH (ref 6.5–8.1)

## 2020-08-14 LAB — LACTIC ACID, PLASMA
Lactic Acid, Venous: 1.4 mmol/L (ref 0.5–1.9)
Lactic Acid, Venous: 1.1 mmol/L (ref 0.5–1.9)

## 2020-08-14 LAB — CREATININE, SERUM
Creatinine, Ser: 8.19 mg/dL — ABNORMAL HIGH (ref 0.44–1.00)
GFR, Estimated: 6 mL/min — ABNORMAL LOW (ref >60)

## 2020-08-14 LAB — URINALYSIS, ROUTINE W REFLEX MICROSCOPIC
Bilirubin Urine: NEGATIVE
Glucose, UA: NEGATIVE mg/dL
Ketones, ur: NEGATIVE mg/dL
Nitrite: NEGATIVE
Protein, ur: 100 mg/dL — AB
Specific Gravity, Urine: 1.011 (ref 1.005–1.030)
WBC, UA: >50 WBC/hpf — ABNORMAL HIGH (ref 0–5)
pH: 5 (ref 5.0–8.0)

## 2020-08-14 LAB — I-STAT BETA HCG BLOOD, ED (MC, WL, AP ONLY): I-stat hCG, quantitative: <5 m[IU]/mL (ref <5)

## 2020-08-14 LAB — DIFFERENTIAL
Abs Immature Granulocytes: 0.12 10*3/uL — ABNORMAL HIGH (ref 0.00–0.07)
Basophils Absolute: 0 10*3/uL (ref 0.0–0.1)
Basophils Relative: 0 %
Eosinophils Absolute: 0.1 10*3/uL (ref 0.0–0.5)
Eosinophils Relative: 1 %
Immature Granulocytes: 1 %
Lymphocytes Relative: 8 %
Lymphs Abs: 1.3 10*3/uL (ref 0.7–4.0)
Monocytes Absolute: 0.6 10*3/uL (ref 0.1–1.0)
Monocytes Relative: 3 %
Neutro Abs: 14.5 10*3/uL — ABNORMAL HIGH (ref 1.7–7.7)
Neutrophils Relative %: 87 %

## 2020-08-14 LAB — ABO/RH: ABO/RH(D): O NEG

## 2020-08-14 LAB — RESP PANEL BY RT-PCR (FLU A&B, COVID) ARPGX2
Influenza A by PCR: NEGATIVE
Influenza B by PCR: NEGATIVE
SARS Coronavirus 2 by RT PCR: NEGATIVE

## 2020-08-14 LAB — LIPASE, BLOOD: Lipase: 82 U/L — ABNORMAL HIGH (ref 11–51)

## 2020-08-14 LAB — MAGNESIUM: Magnesium: 1.2 mg/dL — ABNORMAL LOW (ref 1.7–2.4)

## 2020-08-14 MED ORDER — SODIUM CHLORIDE 0.9 % IV BOLUS
1000.0000 mL | Freq: Once | INTRAVENOUS | Status: AC
  Administered 2020-08-14: 1000 mL via INTRAVENOUS

## 2020-08-14 MED ORDER — METOCLOPRAMIDE HCL 5 MG/ML IJ SOLN
10.0000 mg | Freq: Once | INTRAMUSCULAR | Status: AC
  Administered 2020-08-14: 10 mg via INTRAVENOUS
  Filled 2020-08-14: qty 2

## 2020-08-14 MED ORDER — SODIUM CHLORIDE 0.9 % IV SOLN
2.0000 g | INTRAVENOUS | Status: DC
  Administered 2020-08-15: 2 g via INTRAVENOUS
  Filled 2020-08-14: qty 20

## 2020-08-14 MED ORDER — DIPHENHYDRAMINE HCL 50 MG/ML IJ SOLN
12.5000 mg | Freq: Four times a day (QID) | INTRAMUSCULAR | Status: DC | PRN
Start: 2020-08-14 — End: 2020-08-22

## 2020-08-14 MED ORDER — ENOXAPARIN SODIUM 30 MG/0.3ML ~~LOC~~ SOLN
30.0000 mg | SUBCUTANEOUS | Status: DC
  Administered 2020-08-14: 30 mg via SUBCUTANEOUS
  Filled 2020-08-14: qty 0.3

## 2020-08-14 MED ORDER — SODIUM CHLORIDE 0.9 % IV SOLN: Freq: Once | INTRAVENOUS | Status: AC

## 2020-08-14 MED ORDER — ONDANSETRON 4 MG PO TBDP
4.0000 mg | ORAL_TABLET | Freq: Three times a day (TID) | ORAL | Status: AC | PRN
  Administered 2020-08-14: 4 mg via ORAL
  Filled 2020-08-14: qty 1

## 2020-08-14 MED ORDER — SODIUM CHLORIDE 0.9 % IV SOLN: INTRAVENOUS | Status: DC

## 2020-08-14 MED ORDER — SODIUM CHLORIDE 0.9 % IV SOLN
1.0000 g | Freq: Once | INTRAVENOUS | Status: AC
  Administered 2020-08-14: 1 g via INTRAVENOUS
  Filled 2020-08-14: qty 10

## 2020-08-14 MED ORDER — SODIUM CHLORIDE 0.9% FLUSH
3.0000 mL | Freq: Two times a day (BID) | INTRAVENOUS | Status: DC
  Administered 2020-08-14 – 2020-08-22 (×7): 3 mL via INTRAVENOUS

## 2020-08-14 MED ORDER — SODIUM CHLORIDE 0.9% IV SOLUTION: Freq: Once | INTRAVENOUS | Status: DC

## 2020-08-14 MED ORDER — FERROUS SULFATE 325 (65 FE) MG PO TABS
325.0000 mg | ORAL_TABLET | Freq: Two times a day (BID) | ORAL | Status: DC
  Administered 2020-08-14: 325 mg via ORAL
  Filled 2020-08-14: qty 1

## 2020-08-14 MED ORDER — SODIUM CHLORIDE 0.9 % IV SOLN: Freq: Once | INTRAVENOUS | Status: DC

## 2020-08-14 MED ORDER — DIPHENHYDRAMINE HCL 50 MG/ML IJ SOLN: 12.5000 mg | Freq: Once | INTRAMUSCULAR | Status: DC | PRN

## 2020-08-14 MED ORDER — ONDANSETRON 4 MG PO TBDP
4.0000 mg | ORAL_TABLET | Freq: Once | ORAL | Status: AC | PRN
  Administered 2020-08-14: 4 mg via ORAL
  Filled 2020-08-14: qty 1

## 2020-08-14 NOTE — ED Provider Notes (Signed)
[facility]ERGENC[facility] DEPARTMENT Provider Note   CSN: [id number] Arrival date & time: 08/14/20  0239     History Chief Complaint  Patient presents with  . Emesis    Marie Brewer is a 40 y.o. female with a history of heroin use, cervical cancer treated with chemoradiation 10 years ago, jaw cancer in 2004, andmorbid obesity who presents to the emergency department by EMS with a chief complaint of vomiting.  The patient reports that she has had countless episodes of vomiting for more than 2 weeks.  She has had considerable weight loss in that time and reports severe fatigue and generalized malaise.  She reports that she has had some occasional diarrhea.  She attempted to drink some tea earlier tonight, but continued to have vomiting.  She had to episodes of vomiting in route with EMS.  She denies fever, chills, abdominal pain, back pain, flank pain, hematuria, vaginal bleeding or discharge, headache, cough, chest pain, shortness of breath, leg swelling.   She does report that she voided 3 times over the last 24 hours.  States the last thing that she can recall eating was Janine Limbo approximately 2 weeks ago.  No known sick contacts.  She does have a history of heroin use.  She has previously used IV heroin, but has not injected in many months.  She was on buprenorphine until she lost her insurance last year.  After that time, she began snorting heroin.  However, she reports that she has been clean for 8 weeks.   The history is provided by the patient and medical records. No language interpreter was used.       Past Medical History:  Diagnosis Date  . History of cervical cancer   . Migraine headache     Patient Active Problem List   Diagnosis Date Noted  . ARF (acute renal failure) ([place]) 08/14/2020  . Cellulitis 05/08/2020  . Edema of right foot 05/08/2020  . AKI (acute kidney injury) ([place]) 05/08/2020  . Sepsis ([place]) 05/06/2020  . Sepsis due to cellulitis ([place])  05/05/2020  . Polysubstance abuse ([place]) 05/05/2020  . Hypokalemia 05/05/2020  . Hyponatremia 05/05/2020  . Cervical ca ([place]) 07/30/2013    Past Surgical History:  Procedure Laterality Date  . IRRIGATION AND DEBRIDEMENT ABSCESS Bilateral 05/05/2020   Procedure: left forearm incision and drainage and aspiration of the right wrist joint;  Surgeon: Cindra Presume, MD;  Location: [facility];  Service: Plastics;  Laterality: Bilateral;  . MANDIBLE FRACTURE SURGERY       OB History   No obstetric history on file.     Family History  Problem Relation Age of Onset  . Autism spectrum disorder Other     Social History   Tobacco Use  . Smoking status: Current Every Day Smoker  . Smokeless tobacco: Never Used  Substance Use Topics  . Alcohol use: No  . Drug use: No    Home Medications Prior to Admission medications   Medication Sig Start Date End Date Taking? Authorizing Provider  ferrous sulfate 325 (65 FE) MG tablet Take 1 tablet (325 mg total) by mouth 2 (two) times daily with a meal. 05/10/20 06/09/20  Antonieta Pert, MD  ibuprofen (ADVIL,MOTRIN) 200 MG tablet Take 400 mg by mouth every 6 (six) hours as needed. For pain    [provider]  silver sulfADIAZINE (SILVADENE) 1 % cream Apply topically 2 (two) times daily. 05/10/20   Antonieta Pert, MD    Allergies  Amoxicillin-pot clavulanate, Imitrex [sumatriptan base], and Cephalexin  Review of Systems   Review of Systems  Constitutional: Positive for fatigue. Negative for activity change, chills and fever.       Malaise  Respiratory: Negative for shortness of breath.   Cardiovascular: Negative for chest pain.  Gastrointestinal: Positive for diarrhea, nausea and vomiting. Negative for abdominal pain.  Genitourinary: Negative for dysuria.  Musculoskeletal: Negative for back pain.  Skin: Negative for rash.  Allergic/Immunologic: Negative for immunocompromised state.  Neurological: Negative for headaches.   Psychiatric/Behavioral: Negative for confusion.    Physical Exam Updated Vital Signs BP (!) 144/96 (BP Location: Right Arm)   Pulse 70   Temp 98.1 F (36.7 C) (Oral)   Resp 15   SpO2 100%   Physical Exam Vitals and nursing note reviewed.  Constitutional:      General: She is not in acute distress.    Appearance: She is ill-appearing. She is not diaphoretic.     Comments: Pearline Cables discoloration noted to the skin  HENT:     Head: Normocephalic.     Mouth/Throat:     Mouth: Mucous membranes are moist.     Pharynx: No oropharyngeal exudate or posterior oropharyngeal erythema.  Eyes:     General: No scleral icterus.    Conjunctiva/sclera: Conjunctivae normal.  Cardiovascular:     Rate and Rhythm: Normal rate and regular rhythm.     Pulses: Normal pulses.     Heart sounds: Normal heart sounds. No murmur heard. No friction rub. No gallop.   Pulmonary:     Effort: Pulmonary effort is normal. No respiratory distress.     Breath sounds: No stridor. No wheezing, rhonchi or rales.  Chest:     Chest wall: No tenderness.  Abdominal:     General: There is no distension.     Palpations: Abdomen is soft. There is no mass.     Tenderness: There is no abdominal tenderness. There is left CVA tenderness. There is no right CVA tenderness, guarding or rebound.     Hernia: No hernia is present.     Comments: Tender to palpation over the left CVA.  No right CVA tenderness.  Abdomen is soft, nontender, nondistended.  No rebound or guarding.  Hypoactive bowel sounds in all 4 quadrants.  Musculoskeletal:        General: Signs of injury present.     Cervical back: Neck supple.     Right lower leg: No edema.     Left lower leg: No edema.     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  Skin:    General: Skin is warm.     Capillary Refill: Capillary refill takes less than 2 seconds.     Coloration: Skin is not jaundiced.     Findings: No bruising or rash.     Comments: Large skin  ulceration with 2 smaller skin ulcerations noted to the left lateral lower leg with surrounding granulation tissue.  No evidence of cellulitis or abscess.  There is dried blood noted to the lateral lower leg and the patient states that she hit her leg in the bathroom earlier tonight.  Neurological:     General: No focal deficit present.     Mental Status: She is alert.     Sensory: Sensory deficit:   Psychiatric:        Behavior: Behavior normal.     ED Results / Procedures / Treatments   Labs (all labs ordered are listed, but  only abnormal results are displayed) Labs Reviewed  LIPASE, BLOOD - Abnormal; Notable for the following components:      Result Value   Lipase 82 (*)    All other components within normal limits  COMPREHENSIVE METABOLIC PANEL - Abnormal; Notable for the following components:   Sodium 131 (*)    Potassium 3.1 (*)    Chloride 87 (*)    Glucose, Bld 102 (*)    BUN 114 (*)    Creatinine, Ser 10.73 (*)    Calcium 7.3 (*)    Total Protein 8.8 (*)    Albumin 2.6 (*)    AST 11 (*)    GFR, Estimated 4 (*)    Anion gap 20 (*)    All other components within normal limits  CBC - Abnormal; Notable for the following components:   WBC 15.7 (*)    RBC 2.27 (*)    Hemoglobin 6.4 (*)    HCT 20.6 (*)    All other components within normal limits  URINALYSIS, ROUTINE W REFLEX MICROSCOPIC - Abnormal; Notable for the following components:   Color, Urine AMBER (*)    APPearance TURBID (*)    Hgb urine dipstick MODERATE (*)    Protein, ur 100 (*)    Leukocytes,Ua LARGE (*)    WBC, UA >50 (*)    Bacteria, UA MANY (*)    Non Squamous Epithelial 0-5 (*)    All other components within normal limits  RESP PANEL BY RT-PCR (FLU A&B, COVID) ARPGX2  URINE CULTURE  CULTURE, BLOOD (ROUTINE X 2)  CULTURE, BLOOD (ROUTINE X 2)  LACTIC ACID, PLASMA  LACTIC ACID, PLASMA  MAGNESIUM  DIFFERENTIAL  I-STAT BETA HCG BLOOD, ED ([facility], [facility], [facility] ONLY)    EKG None  Radiology No results  found.  Procedures .Critical Care Performed by: Joanne Gavel, PA-C Authorized by: Joanne Gavel, PA-C   Critical care provider statement:    Critical care time (minutes):  42   Critical care time was exclusive of:  Separately billable procedures and treating other patients and teaching time   Critical care was necessary to treat or prevent imminent or life-threatening deterioration of the following conditions:  Renal failure   Critical care was time spent personally by me on the following activities:  Ordering and review of laboratory studies, ordering and review of radiographic studies, ordering and performing treatments and interventions, pulse oximetry, re-evaluation of patient's condition, review of old charts, obtaining history from patient or surrogate, examination of patient, evaluation of patient's response to treatment and development of treatment plan with patient or surrogate   I assumed direction of critical care for this patient from another provider in my specialty: no     Care discussed with: admitting provider     (including critical care time)  Medications Ordered in ED Medications  cefTRIAXone (ROCEPHIN) 1 g in sodium chloride 0.9 % 100 mL IVPB (1 g Intravenous New Bag/Given 08/14/20 0646)  diphenhydrAMINE (BENADRYL) injection 12.5 mg (has no administration in time range)  0.9 %  sodium chloride infusion (has no administration in time range)  ondansetron (ZOFRAN-ODT) disintegrating tablet 4 mg (4 mg Oral Given 08/14/20 0317)  sodium chloride 0.9 % bolus 1,000 mL (1,000 mLs Intravenous New Bag/Given 08/14/20 0643)  metoCLOPramide (REGLAN) injection 10 mg (10 mg Intravenous Given 08/14/20 4481)    ED Course  I have reviewed the triage vital signs and the nursing notes.  Pertinent labs & imaging results that were available during  my care of the patient were reviewed by me and considered in my medical decision making (see chart for details).  Clinical Course as of  08/14/20 0659  Sat Aug 14, 2020  0557 Spoke with Marya Amsler and pharmacy regarding antibiotic selection for infectious appearing urine.  Consults appreciated.  Patient does have history of a rash when being given Rocephin.  She did appear to have some hepatic steatosis on previous CT imaging.  Previous urine culture with pansensitive E. coli.  Although she does have a mild allergy to Rocephin, pharmacy recommends Rocephin for his symptoms.  This has been ordered as well as a as needed dose of Benadryl. [MM]  0654 Creatinine(!): 10.73 [MM]  0654 Hemoglobin(!!): 6.4 [MM]    Clinical Course User Index [MM] Ninoska Goswick, Laymond Purser, PA-C   MDM Rules/Calculators/A&P                          40 year old female with a history of heroin use, cervical cancer treated with chemoradiation 10 years ago, jaw cancer, morbid obesity who presents to the emergency department by EMS with a chief complaint of intractable vomiting for 2 weeks with associated weight loss, fatigue, and malaise.  No constitutional symptoms.  She has continued to make urine and has voided 3 times over the last 24 hours.  She does have a history of heroin use, but has not used in 8 weeks.  Previously, she was snorting heroin, but does have a remote history of IV heroin use.  Borderline tachycardic on arrival to the ER.  She had 2 episodes of vomiting in route.  No hypoxia or tachypnea.  On exam, she is ill-appearing and appears to have had weight loss in the abdomen.  Abdomen is nontender, but she does have left CVA tenderness.  There is a skin ulceration noted to the left lower leg with some surrounding granulation tissue formation.  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  Labs and imaging have been reviewed and independently interpreted by me.   She is in acute renal failure with a creatinine of 10.73 (BUN 114), up from 1.09 in October.  She does have a metabolic acidosis with  an elevated anion gap, likely secondary to acute renal failure.  She does have mild hypokalemia.  We will add on magnesium level.    Urine does appear infectious and urine culture has been sent.  Previous urine cultures reviewed, which demonstrated pansensitive E. coli.  She does have an urticarial reaction to Rocephin, but after discussing the patient with pharmacy, they recommend Rocephin for treatment of UTI.  As needed Benadryl has also been ordered.  She does have a leukocytosis of 15.7, but this does appear chronic. Given leukocytosis with infectious appearing urine, will also order lactate and blood cultures given her history of polysubstance use.    Hemoglobin is 6.4, down from 7.7.  In regards to her vomiting, I question if this is actually lower from hemoconcentration.  She has no active bleeding at this time.  Worsening anemia could also be secondary to kidney disease.  Will give patient 1 L fluid bolus and start her on an infusion.  Will order renal ultrasound.  No indication for emergent nephrology consult at this time as she does not have any metabolic derangements.  Discussed the patient with Dr. Roxanne Mins, attending physician, who is also in agreement with this work-up and plan.  Consult to the hospitalist team and Dr. Hal Hope will accept the patient for admission.  The patient appears reasonably stabilized for admission considering the current resources, flow, and capabilities available in the ED at this time, and I doubt any other [facility] requiring further screening and/or treatment in the ED prior to admission.   Final Clinical Impression(s) / ED Diagnoses Final diagnoses:  Acute renal failure, unspecified acute renal failure type ([place])  Anemia, unspecified type    Rx / DC Orders ED Discharge Orders    None       Joanne Gavel, PA-C 21/22/48 2500    Delora Fuel, MD 37/04/88 [phone]

## 2020-08-14 NOTE — Progress Notes (Signed)
NEW ADMISSION NOTE New Admission Note:   Arrival Method: Stretcher Mental Orientation: AAOx4 Telemetry: Box 09 Assessment: Completed Skin: Unstageable Left Lower Leg with surrounding granulation tissue, see documentation IV: LH 22g Pain: 0/10 Tubes: n/a Safety Measures: Safety Fall Prevention Plan has been given, discussed and signed Admission: Completed [facility] Orientation: Patient has been orientated to the room, unit and staff.  Family: none present at bedside  Orders have been reviewed and implemented. Will continue to monitor the patient. Call light has been placed within reach and bed alarm has been activated.   Vira Agar, RN

## 2020-08-14 NOTE — ED Notes (Signed)
The pt reports that she has been ill for 2 weeks with nausea vomiting she is c/o being fatigued

## 2020-08-14 NOTE — ED Notes (Signed)
IV team able to place 22g in left hand. MD Kyung Bacca notified of problematic access for future blood transfusions, MD aware. Pt resting in bed. NADN. Will continue to monitor.

## 2020-08-14 NOTE — ED Notes (Signed)
Report to floor nursing staff.

## 2020-08-14 NOTE — ED Notes (Signed)
First contact. Change of shift. RUA IV appearing to have infiltrated, has been removed.

## 2020-08-14 NOTE — Plan of Care (Signed)

## 2020-08-14 NOTE — ED Triage Notes (Signed)
Pt arrives via EMS. c/o  Last 'normal 2 weeks ago'. Tonight having increased pain, nausea, vomiting, diarrhea. Drank tea around 1am this monring. Has not had any solid foods in a week. Denies COVID exposure. Pain 7/10. 2 episodes of vomiting en route. 180/112, hr 101, 98% ra, rr 16. Has not had heroin in 4 weeks.

## 2020-08-14 NOTE — ED Notes (Signed)
Date and time results received: 08/14/20 0830 (use smartphrase ".now" to insert current time)  Test:hgb Critical Value: 4.6  Name of Provider Notified:Nwannadiya Kyung Bacca, MD  Orders Received? Or Actions Taken?:

## 2020-08-14 NOTE — Progress Notes (Signed)
RN messaged MD on call to get clarification on low hgb from 0600 this morning. Hgb was 4.6 and RN sees notes about pt having too small of IV and about going to give blood, but pt has not received any. Awaiting MD response.   Eleanora Neighbor, RN

## 2020-08-14 NOTE — ED Notes (Signed)
Pt going to Ultrasound. 

## 2020-08-14 NOTE — ED Notes (Signed)
IV team reassessing for line.

## 2020-08-14 NOTE — Consult Note (Signed)
Renal Service Consult Note [facility]  Marie Brewer 08/14/2020 Sol Blazing, MD Requesting Physician: Dr Kyung Bacca  Reason for Consult: Renal failure HPI: The patient is a 40 y.o. year-old w/ hx of polysubstance abuse, migraine, hx cervical cancer (in remission) came to ED per EMS for increasing abd pain, nausea , vomiting and diarrhea. No solid foods for a week. Denied COVID exposure. Abd pain severe, BP was high 180/112 on presentation, HR 100, RA 98%. Has not had heroin in 4 weeks. In ED labs showed Hb 6.4 and repeat 4.6 2 hrs later. wBC 16K, plt 343. BUN 114, Creat 10.73, K 3.1, CO2 24.  Ca 7.3  Alb 2.6.  AST 11, ALT 9, Tbili 0.8.  Asked to see for renal failure.   Pt seen in room. Pt lives w/ husband, denies drug use , +tobacco, no etoh. No hx kidney disease, change in urine color, coke/ tea-colored urine. No voiding issues. Has voided yest and today, yellow and clear. No SOB or chest pain. +N/V as above and diarrhea.   Pt had cervical cancer in 2012 treated w/ chemo and XRT, no recurrence.    In her 34's- 68's she worked long hours of fast food work and developed bad edema of both lower legs w/ sig swelling and over the years discoloration of the lower legs.  However, they do not swell anymore.    She was working at [employer] up until the pandemic hit, hasn't worked since.   No hx HTN, DM, heart issues.   ROS  denies CP  no joint pain   no HA  no blurry vision  no rash  no dysuria  no difficulty voiding  no change in urine color    Past Medical History  Past Medical History:  Diagnosis Date  . History of cervical cancer   . Migraine headache   . Polysubstance abuse ([place]) 05/05/2020   Past Surgical History  Past Surgical History:  Procedure Laterality Date  . IRRIGATION AND DEBRIDEMENT ABSCESS Bilateral 05/05/2020   Procedure: left forearm incision and drainage and aspiration of the right wrist joint;  Surgeon: Cindra Presume, MD;  Location: [facility];   Service: Plastics;  Laterality: Bilateral;  . MANDIBLE FRACTURE SURGERY     Family History  Family History  Problem Relation Age of Onset  . Autism spectrum disorder Other    Social History  reports that she has been smoking. She has never used smokeless tobacco. She reports that she does not drink alcohol and does not use drugs. Allergies  Allergies  Allergen Reactions  . Amoxicillin-Pot Clavulanate Anaphylaxis  . Imitrex [Sumatriptan Base] Other (See Comments)    Heart races  . Cephalexin Rash   Home medications Prior to Admission medications   Not on File     Vitals:   08/14/20 0800 08/14/20 1015 08/14/20 1348 08/14/20 1753  BP: (!) 146/82 138/90 (!) 153/82 140/90  Pulse: 74 77 70 80  Resp: 15 20 18 18   Temp: 98.4 F (36.9 C)  (!) 97.5 F (36.4 C) (!) 97.4 F (36.3 C)  TempSrc: Oral  Oral Oral  SpO2: 100% 100% 100% 99%   Exam Gen alert, chron ill slightly on appearance No rash, cyanosis or gangrene Sclera anicteric, throat clear and slightly dry  No jvd or bruits, flat neck veins Chest clear bilat to bases, no rales or wheezing RRR no MRG Abd soft ntnd no mass or ascites +bs GU defer MS no joint effusions or deformity Ext  brawny skin changes in both lower legs w/ some chronic skin wrinkling, but no pitting edema, no wounds or ulcers Neuro is alert, Ox 3 , nf, no asterixis or jerking of ext    Home meds:  - none  April 2019 Abd CT renal stone study >> Adrenals/Urinary Tract: Adrenal glands are unremarkable. Kidneys are normal, without renal calculi, focal lesion, or hydronephrosis. Bladder is under distended. 08/14/20 - RENAL US > Right -- 11cm, echogenic cortex, mild to moderate R hydronephrosis, no mass or cyst. Left -- difficult to visualize, might be obscured by chronic renal cortical insufficiency, 12 cm in length, no hydronephrosis. No definite left renal mass. Bladder -- diminutive, unremarkable.    UA 08/14/20 - turbid, amber, mod Hb, larg LE, 100 prot,  many bact, 11-20rbc, >50 wbc, 0-5 epis   UA 05/08/20 - normal    BP's normal to high since presentation last night 140/90 average    100% on RA  tmasx 98 deg here, WBC 16K    Blood cx's  1/2 neg at 12 hrs     COVID neg      Last creat 1.09 in Oct 2021     HIV negative  Assessment/ Plan: 1. AKI - severe acute renal failure in pt w/ hx of heroin abuse (off x 4 wks) here w/ N/V, severe acute on chronic anemia, wt loss and malaise. UA shows some rbc's and wbc's, moderate protein. Will get proteinuria quantification and send off complements/ serologies. Renal US suggesting obstruction on the R side, and L kidney difficult to visualize (?echogenic). Has been vomiting and no vol excess on exam so presumably is vol depleted. Not on any meds at home. Prob component of vol depletion, concern also for obstruction w/ [place] results. With hx IVDU bacteremia and endocarditis are always a concern. Would place foley, get urology input. Give generous IVF's for now.  Serologies and hepatitis panel ordered. No absolute indication for RRT at this time. Will follow.  2. Acute / chronic anemia - Hb was 7- 10 range here recently in Oct 2021. Per primary team.  3. ^WBC - pmd, started on Rocephin for suspected UTI.  4. UTI - as above      Kelly Splinter  MD 08/14/2020, 7:59 PM  Recent Labs  Lab 08/14/20 0318 08/14/20 0632  WBC 15.7* 16.7*  HGB 6.4* 4.6*   Recent Labs  Lab 08/14/20 0318 08/14/20 1432  K 3.1*  --   BUN 114*  --   CREATININE 10.73* 8.19*  CALCIUM 7.3*  --

## 2020-08-14 NOTE — ED Triage Notes (Signed)
Pt states that she has had vomiting for several weeks and pain "all over", some diarrhea. Denies fevers.

## 2020-08-14 NOTE — H&P (Signed)
History and Physical  Marie Brewer PNT:[id number] DOB: February 28, 1981 DOA: 08/14/2020  Referring physician: Joanne Gavel, PA-C   PCP: Pcp, No  Outpatient Specialists:  Patient coming from: Home & is able to ambulate   Chief Complaint: Nausea and vomiting for 1 month  HPI: Marie Brewer is a 40 y.o. female with medical history significant for polysubstance abuse, remote history of uterine cervical cancer anemia.  She has been abusing heroin but stated that she stopped using heroin 2 months ago.  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  73.  Her sodium was low at 131 potassium also slightly low 3.1, she was given 1 bolus of normal saline 1 L started on Zofran and promethazine for nausea vomiting. The history is provided by patient and ED medical record  Review of Systems: Positive for nausea vomiting some diarrhea significant weight loss malaise fatigue loss of appetite no food for about 3 days now Pt denies any fever chills.  Chest pain fever abdominal pain hematuria discharge vaginal bleeding or leg swelling or shortness of breath.    Past Medical History:  Diagnosis Date  . History of cervical cancer   . Migraine headache   . Polysubstance abuse ([place]) 05/05/2020   Past Surgical History:  Procedure Laterality Date  . IRRIGATION AND DEBRIDEMENT ABSCESS Bilateral 05/05/2020   Procedure: left forearm incision and drainage and aspiration of the right wrist joint;  Surgeon: Cindra Presume, MD;  Location: [facility];  Service: Plastics;  Laterality: Bilateral;  . MANDIBLE FRACTURE SURGERY      Social  History:  reports that she has been smoking. She has never used smokeless tobacco. She reports that she does not drink alcohol and does not use drugs.   Allergies  Allergen Reactions  . Amoxicillin-Pot Clavulanate Anaphylaxis  . Imitrex [Sumatriptan Base] Other (See Comments)    Heart races  . Cephalexin Rash    Family History  Problem Relation Age of Onset  . Autism spectrum disorder Other       Prior to Admission medications   Medication Sig Start Date End Date Taking? Authorizing Provider  ferrous sulfate 325 (65 FE) MG tablet Take 1 tablet (325 mg total) by mouth 2 (two) times daily with a meal. 05/10/20 06/09/20  Antonieta Pert, MD  ibuprofen (ADVIL,MOTRIN) 200 MG tablet Take 400 mg by mouth every 6 (six) hours as needed. For pain    [provider]  silver sulfADIAZINE (SILVADENE) 1 % cream Apply topically 2 (two) times daily. 05/10/20   Antonieta Pert, MD    Physical Exam: BP 138/90   Pulse 77   Temp 98.4 F (36.9 C) (Oral)   Resp 20   SpO2 100%   Exam:  . General: 40 y.o. year-old female well developed well nourished in no acute distress.  Alert and oriented x3.  Chronically ill looking looks older than her stated age of 31 . Cardiovascular: Regular rate and rhythm with no rubs or gallops.  No thyromegaly or JVD noted.   Marland Kitchen Respiratory: Clear to auscultation with no wheezes or rales. Good  inspiratory effort. . Abdomen: Soft nontender nondistended with normal bowel sounds x4 quadrants. . Musculoskeletal: No lower extremity edema. 2/4 pulses in all 4 extremities.  partial amputation of the right index finger . Skin: Large skin ulceration on the left lower leg laterally with surrounding granulation tissue. Marland Kitchen Psychiatry: Mood is appropriate for condition and setting           Labs on Admission:  Basic Metabolic Panel: Recent Labs  Lab 08/14/20 0318 08/14/20 0632  NA 131*  --   K 3.1*  --   CL 87*  --   CO2 24  --   GLUCOSE 102*  --   BUN 114*  --    CREATININE 10.73*  --   CALCIUM 7.3*  --   MG  --  1.2*   Liver Function Tests: Recent Labs  Lab 08/14/20 0318  AST 11*  ALT 9  ALKPHOS 111  BILITOT 0.8  PROT 8.8*  ALBUMIN 2.6*   Recent Labs  Lab 08/14/20 0318  LIPASE 82*   No results for input(s): AMMONIA in the last 168 hours. CBC: Recent Labs  Lab 08/14/20 0318 08/14/20 0632  WBC 15.7* 16.7*  NEUTROABS  --  14.5*  HGB 6.4* 4.6*  HCT 20.6* 15.0*  MCV 90.7 91.5  PLT 348 343   Cardiac Enzymes: No results for input(s): CKTOTAL, CKMB, CKMBINDEX, TROPONINI in the last 168 hours.  BNP (last 3 results) No results for input(s): BNP in the last 8760 hours.  ProBNP (last 3 results) No results for input(s): PROBNP in the last 8760 hours.  CBG: No results for input(s): GLUCAP in the last 168 hours.  Radiological Exams on Admission: US Renal  Result Date: 08/14/2020 CLINICAL DATA:  40 year old female with acute renal failure. EXAM: RENAL / URINARY TRACT ULTRASOUND COMPLETE COMPARISON:  CT Abdomen and Pelvis 11/13/2017. FINDINGS: Right Kidney: Renal measurements: 11.1 x 5.0 x 5.7 cm = volume: 166 mL. Echogenic right renal cortex (image 4). Mild to moderate right hydronephrosis (image 9). No right renal mass or definite cyst. Left Kidney: Renal measurements: Left kidney is difficult to visualize and might be obscured by chronic renal cortical insufficiency (that measured on image 12 might not entirely be renal parenchyma (12 cm in length). No left hydronephrosis. No definite left renal mass. Bladder: Diminutive, unremarkable. Other: IMPRESSION: 1. Echogenic right kidney suggesting chronic medical renal disease but with superimposed mild to moderate right hydronephrosis. 2. No left hydronephrosis although the left kidney is poorly delineated by ultrasound. Possible left renal atrophy. 3. Follow-up noncontrast CT Abdomen and Pelvis recommended. Electronically Signed   By: Genevie Ann M.D.   On: 08/14/2020 07:26    EKG:   none  Assessment/Plan Present on Admission: . ARF (acute renal failure) ([place]) . Polysubstance abuse ([place]) . Acute renal failure (ARF) ([facility])  Active Problems:   Polysubstance abuse ([place])   ARF (acute renal failure) ([place])   Acute renal failure (ARF) ([place])  1.  Acute renal failure.  Worsened by nausea and vomiting and dehydration.  Patient has been hydrated in the ED we will continue IV hydration Will order renal consult  2.  Chronic anemia likely from chronic kidney disease but also dilutional patient came in with 6.4 hemoglobin is 4.6 we will type and crossmatch and transfuse  3.  Hyponatremia we will continue IV hydration with normal saline  4.  Mild hypokalemia we will replace with potassium  5.  Hypomagnesemia we will replace both magnesium and potassium  6.  Mild leukocytosis  7.  Urinary tract infection.  Patient was started on ceftriaxone she does have allergy to penicillin but pharmacy advised to continue ceftriaxone with Benadryl as needed for itching  Severity of Illness: The appropriate patient status for this patient is INPATIENT. Inpatient status is judged to be reasonable and necessary in order to provide the required intensity of service to ensure the patient's safety. The patient's presenting symptoms, physical exam findings, and initial radiographic and laboratory data in the context of their chronic comorbidities is felt to place them at high risk for further clinical deterioration. Furthermore, it is not anticipated that the patient will be medically stable for discharge from the hospital within 2 midnights of admission. The following factors support the patient status of inpatient.   " The patient's presenting symptoms include nausea and vomiting inability to keep anything down. " The worrisome physical exam findings include chronically ill looking dehydration acute renal failure. " The initial radiographic and laboratory data are worrisome because of acute renal  failure. " The chronic co-morbidities include .   * I certify that at the point of admission it is my clinical judgment that the patient will require inpatient hospital care spanning beyond 2 midnights from the point of admission due to high intensity of service, high risk for further deterioration and high frequency of surveillance required.*    DVT prophylaxis: Lovenox  Code Status: Full  Family Communication: Patient  Disposition Plan: Home when stable  Consults called: Renal  Admission status: Inpatient    Cristal Deer MD [facility] Pager [phone]  If 7PM-7AM, please contact night-coverage [email or web address] Password [email or web address]  08/14/2020, 11:41 AM

## 2020-08-15 ENCOUNTER — Inpatient Hospital Stay (HOSPITAL_COMMUNITY): Payer: Self-pay

## 2020-08-15 ENCOUNTER — Inpatient Hospital Stay (HOSPITAL_COMMUNITY): Payer: Self-pay | Admitting: Anesthesiology

## 2020-08-15 ENCOUNTER — Encounter (HOSPITAL_COMMUNITY): Payer: Self-pay | Admitting: Family Medicine

## 2020-08-15 ENCOUNTER — Encounter (HOSPITAL_COMMUNITY)
Admission: EM | Disposition: A | Discharge status: Home Health | Payer: Self-pay | Source: Home / Self Care | Attending: Internal Medicine

## 2020-08-15 DIAGNOSIS — D520 Dietary folate deficiency anemia: Secondary | ICD-10-CM

## 2020-08-15 DIAGNOSIS — D649 Anemia, unspecified: Secondary | ICD-10-CM

## 2020-08-15 DIAGNOSIS — N179 Acute kidney failure, unspecified: Secondary | ICD-10-CM

## 2020-08-15 DIAGNOSIS — N1330 Unspecified hydronephrosis: Secondary | ICD-10-CM

## 2020-08-15 DIAGNOSIS — D539 Nutritional anemia, unspecified: Secondary | ICD-10-CM

## 2020-08-15 HISTORY — PX: CYSTOSCOPY W/ URETERAL STENT PLACEMENT: SHX1429

## 2020-08-15 LAB — CBC
HCT: 17.2 % — ABNORMAL LOW (ref 36.0–46.0)
Hemoglobin: 5.3 g/dL — CL (ref 12.0–15.0)
MCH: 27.9 pg (ref 26.0–34.0)
MCHC: 30.8 g/dL (ref 30.0–36.0)
MCV: 90.5 fL (ref 80.0–100.0)
Platelets: 274 10*3/uL (ref 150–400)
RBC: 1.9 MIL/uL — ABNORMAL LOW (ref 3.87–5.11)
RDW: 14.8 % (ref 11.5–15.5)
WBC: 13.9 10*3/uL — ABNORMAL HIGH (ref 4.0–10.5)
nRBC: 0 % (ref 0.0–0.2)

## 2020-08-15 LAB — RETICULOCYTES
Immature Retic Fract: 13.1 % (ref 2.3–15.9)
Immature Retic Fract: 11.1 % (ref 2.3–15.9)
RBC.: 1.82 MIL/uL — ABNORMAL LOW (ref 3.87–5.11)
RBC.: 2.25 MIL/uL — ABNORMAL LOW (ref 3.87–5.11)
Retic Count, Absolute: 42.4 10*3/uL (ref 19.0–186.0)
Retic Count, Absolute: 52.7 10*3/uL (ref 19.0–186.0)
Retic Ct Pct: 2.3 % (ref 0.4–3.1)
Retic Ct Pct: 2.3 % (ref 0.4–3.1)

## 2020-08-15 LAB — HEMOGLOBIN AND HEMATOCRIT, BLOOD
HCT: 19.2 % — ABNORMAL LOW (ref 36.0–46.0)
HCT: 27.8 % — ABNORMAL LOW (ref 36.0–46.0)
Hemoglobin: 6.4 g/dL — CL (ref 12.0–15.0)
Hemoglobin: 8.9 g/dL — ABNORMAL LOW (ref 12.0–15.0)

## 2020-08-15 LAB — POCT I-STAT, CHEM 8
BUN: 124 mg/dL — ABNORMAL HIGH (ref 6–20)
Calcium, Ion: 0.87 mmol/L — CL (ref 1.15–1.40)
Chloride: 95 mmol/L — ABNORMAL LOW (ref 98–111)
Creatinine, Ser: 9 mg/dL — ABNORMAL HIGH (ref 0.44–1.00)
Glucose, Bld: 81 mg/dL (ref 70–99)
HCT: 21 % — ABNORMAL LOW (ref 36.0–46.0)
Hemoglobin: 7.1 g/dL — ABNORMAL LOW (ref 12.0–15.0)
Potassium: 4.1 mmol/L (ref 3.5–5.1)
Sodium: 132 mmol/L — ABNORMAL LOW (ref 135–145)
TCO2: 26 mmol/L (ref 22–32)

## 2020-08-15 LAB — IRON AND TIBC
Iron: 58 ug/dL (ref 28–170)
Saturation Ratios: 29 % (ref 10.4–31.8)
TIBC: 203 ug/dL — ABNORMAL LOW (ref 250–450)
UIBC: 145 ug/dL

## 2020-08-15 LAB — FOLATE: Folate: 3.6 ng/mL — ABNORMAL LOW (ref >5.9)

## 2020-08-15 LAB — DIC (DISSEMINATED INTRAVASCULAR COAGULATION)PANEL
D-Dimer, Quant: 3.1 ug/mL-FEU — ABNORMAL HIGH (ref 0.00–0.50)
Fibrinogen: 666 mg/dL — ABNORMAL HIGH (ref 210–475)
INR: 1.2 (ref 0.8–1.2)
Platelets: 274 10*3/uL (ref 150–400)
Prothrombin Time: 15.1 seconds (ref 11.4–15.2)
Smear Review: NONE SEEN
aPTT: 33 seconds (ref 24–36)

## 2020-08-15 LAB — COMPREHENSIVE METABOLIC PANEL
ALT: 8 U/L (ref 0–44)
AST: 9 U/L — ABNORMAL LOW (ref 15–41)
Albumin: 2.3 g/dL — ABNORMAL LOW (ref 3.5–5.0)
Alkaline Phosphatase: 91 U/L (ref 38–126)
Anion gap: 18 — ABNORMAL HIGH (ref 5–15)
BUN: 106 mg/dL — ABNORMAL HIGH (ref 6–20)
CO2: 22 mmol/L (ref 22–32)
Calcium: 7.1 mg/dL — ABNORMAL LOW (ref 8.9–10.3)
Chloride: 92 mmol/L — ABNORMAL LOW (ref 98–111)
Creatinine, Ser: 10.02 mg/dL — ABNORMAL HIGH (ref 0.44–1.00)
GFR, Estimated: 5 mL/min — ABNORMAL LOW (ref >60)
Glucose, Bld: 96 mg/dL (ref 70–99)
Potassium: 3 mmol/L — ABNORMAL LOW (ref 3.5–5.1)
Sodium: 132 mmol/L — ABNORMAL LOW (ref 135–145)
Total Bilirubin: 0.6 mg/dL (ref 0.3–1.2)
Total Protein: 7.5 g/dL (ref 6.5–8.1)

## 2020-08-15 LAB — PREPARE RBC (CROSSMATCH)

## 2020-08-15 LAB — URINE CULTURE: Culture: >=100000 — AB

## 2020-08-15 LAB — HEPATITIS PANEL, ACUTE
HCV Ab: REACTIVE — AB
Hep A IgM: NONREACTIVE
Hep B C IgM: NONREACTIVE
Hepatitis B Surface Ag: NONREACTIVE

## 2020-08-15 LAB — PROTEIN / CREATININE RATIO, URINE
Creatinine, Urine: 67.84 mg/dL
Protein Creatinine Ratio: 3.97 mg/mg{Cre} — ABNORMAL HIGH (ref 0.00–0.15)
Total Protein, Urine: 269 mg/dL

## 2020-08-15 LAB — LACTATE DEHYDROGENASE: LDH: 136 U/L (ref 98–192)

## 2020-08-15 LAB — CREATININE, URINE, RANDOM: Creatinine, Urine: 69.54 mg/dL

## 2020-08-15 LAB — SODIUM, URINE, RANDOM: Sodium, Ur: 44 mmol/L

## 2020-08-15 LAB — VITAMIN B12: Vitamin B-12: 537 pg/mL (ref 180–914)

## 2020-08-15 LAB — FERRITIN: Ferritin: 495 ng/mL — ABNORMAL HIGH (ref 11–307)

## 2020-08-15 SURGERY — CYSTOSCOPY, WITH RETROGRADE PYELOGRAM AND URETERAL STENT INSERTION
Anesthesia: General | Site: Ureter

## 2020-08-15 MED ORDER — IOHEXOL 350 MG/ML SOLN: 50.0000 mL | Freq: Once | INTRAVENOUS | Status: DC | PRN

## 2020-08-15 MED ORDER — SODIUM CHLORIDE 0.9% IV SOLUTION: Freq: Once | INTRAVENOUS | Status: DC

## 2020-08-15 MED ORDER — FOLIC ACID 1 MG PO TABS
1.0000 mg | ORAL_TABLET | Freq: Every day | ORAL | Status: DC
  Administered 2020-08-15: 1 mg via ORAL
  Filled 2020-08-15: qty 1

## 2020-08-15 MED ORDER — FENTANYL CITRATE (PF) 100 MCG/2ML IJ SOLN
25.0000 ug | INTRAMUSCULAR | Status: DC | PRN
  Administered 2020-08-15 (×2): 50 ug via INTRAVENOUS

## 2020-08-15 MED ORDER — MIDAZOLAM HCL 2 MG/2ML IJ SOLN
INTRAMUSCULAR | Status: AC
  Filled 2020-08-15: qty 2

## 2020-08-15 MED ORDER — ONDANSETRON HCL 4 MG/2ML IJ SOLN
4.0000 mg | Freq: Four times a day (QID) | INTRAMUSCULAR | Status: DC | PRN
  Administered 2020-08-18 (×3): 4 mg via INTRAVENOUS
  Filled 2020-08-15 (×5): qty 2

## 2020-08-15 MED ORDER — ACETAMINOPHEN 325 MG PO TABS: 650.0000 mg | ORAL_TABLET | ORAL | Status: DC | PRN

## 2020-08-15 MED ORDER — FENTANYL CITRATE (PF) 250 MCG/5ML IJ SOLN
INTRAMUSCULAR | Status: AC
  Filled 2020-08-15: qty 5

## 2020-08-15 MED ORDER — MIDAZOLAM HCL 2 MG/2ML IJ SOLN
INTRAMUSCULAR | Status: DC | PRN
  Administered 2020-08-15: 2 mg via INTRAVENOUS

## 2020-08-15 MED ORDER — SODIUM CHLORIDE 0.9 % IV SOLN: 1.0000 mg | Freq: Every day | INTRAVENOUS | Status: DC

## 2020-08-15 MED ORDER — SODIUM CHLORIDE 0.9 % IV SOLN: INTRAVENOUS | Status: DC | PRN

## 2020-08-15 MED ORDER — PANTOPRAZOLE SODIUM 40 MG IV SOLR
40.0000 mg | Freq: Two times a day (BID) | INTRAVENOUS | Status: DC
  Administered 2020-08-15 – 2020-08-22 (×14): 40 mg via INTRAVENOUS
  Filled 2020-08-15 (×14): qty 40

## 2020-08-15 MED ORDER — FENTANYL CITRATE (PF) 100 MCG/2ML IJ SOLN
INTRAMUSCULAR | Status: AC
  Filled 2020-08-15: qty 2

## 2020-08-15 MED ORDER — FOLIC ACID 5 MG/ML IJ SOLN
1.0000 mg | Freq: Every day | INTRAMUSCULAR | Status: DC
  Administered 2020-08-15 – 2020-08-22 (×7): 1 mg via INTRAVENOUS
  Filled 2020-08-15 (×8): qty 0.2

## 2020-08-15 MED ORDER — LIDOCAINE HCL URETHRAL/MUCOSAL 2 % EX GEL
CUTANEOUS | Status: AC
  Filled 2020-08-15: qty 11

## 2020-08-15 MED ORDER — STERILE WATER FOR IRRIGATION IR SOLN
Status: DC | PRN
  Administered 2020-08-15: 3000 mL

## 2020-08-15 MED ORDER — OXYCODONE HCL 5 MG PO TABS: 5.0000 mg | ORAL_TABLET | Freq: Once | ORAL | Status: DC | PRN

## 2020-08-15 MED ORDER — SUCCINYLCHOLINE CHLORIDE 200 MG/10ML IV SOSY
PREFILLED_SYRINGE | INTRAVENOUS | Status: DC | PRN
  Administered 2020-08-15: 100 mg via INTRAVENOUS

## 2020-08-15 MED ORDER — OXYCODONE HCL 5 MG/5ML PO SOLN: 5.0000 mg | Freq: Once | ORAL | Status: DC | PRN

## 2020-08-15 MED ORDER — ONDANSETRON HCL 4 MG/2ML IJ SOLN: 4.0000 mg | Freq: Four times a day (QID) | INTRAMUSCULAR | Status: DC | PRN

## 2020-08-15 MED ORDER — MAGNESIUM SULFATE 2 GM/50ML IV SOLN
2.0000 g | Freq: Once | INTRAVENOUS | Status: AC
  Administered 2020-08-15: 2 g via INTRAVENOUS
  Filled 2020-08-15: qty 50

## 2020-08-15 MED ORDER — SODIUM CHLORIDE 0.9 % IV SOLN
1.0000 g | INTRAVENOUS | Status: DC
  Administered 2020-08-15 – 2020-08-18 (×4): 1 g via INTRAVENOUS
  Filled 2020-08-15 (×5): qty 1

## 2020-08-15 MED ORDER — CHLORHEXIDINE GLUCONATE CLOTH 2 % EX PADS
6.0000 | MEDICATED_PAD | Freq: Every day | CUTANEOUS | Status: DC
  Administered 2020-08-17 – 2020-08-21 (×2): 6 via TOPICAL

## 2020-08-15 MED ORDER — PROPOFOL 10 MG/ML IV BOLUS
INTRAVENOUS | Status: DC | PRN
  Administered 2020-08-15 (×2): 20 mg via INTRAVENOUS
  Administered 2020-08-15: 100 mg via INTRAVENOUS

## 2020-08-15 MED ORDER — PROPOFOL 10 MG/ML IV BOLUS
INTRAVENOUS | Status: AC
  Filled 2020-08-15: qty 20

## 2020-08-15 MED ORDER — ONDANSETRON HCL 4 MG/2ML IJ SOLN
INTRAMUSCULAR | Status: DC | PRN
  Administered 2020-08-15: 4 mg via INTRAVENOUS

## 2020-08-15 MED ORDER — FENTANYL CITRATE (PF) 250 MCG/5ML IJ SOLN
INTRAMUSCULAR | Status: DC | PRN
  Administered 2020-08-15: 50 ug via INTRAVENOUS
  Administered 2020-08-15: 25 ug via INTRAVENOUS
  Administered 2020-08-15: 50 ug via INTRAVENOUS
  Administered 2020-08-15: 25 ug via INTRAVENOUS

## 2020-08-15 MED ORDER — PROMETHAZINE HCL 25 MG/ML IJ SOLN
12.5000 mg | Freq: Four times a day (QID) | INTRAMUSCULAR | Status: AC | PRN
Start: 2020-08-15 — End: 2020-08-15
  Administered 2020-08-15 (×2): 12.5 mg via INTRAVENOUS
  Filled 2020-08-15 (×2): qty 1

## 2020-08-15 MED ORDER — CHLORHEXIDINE GLUCONATE 0.12 % MT SOLN
OROMUCOSAL | Status: AC
  Filled 2020-08-15: qty 15

## 2020-08-15 MED ORDER — DEXAMETHASONE SODIUM PHOSPHATE 10 MG/ML IJ SOLN
INTRAMUSCULAR | Status: DC | PRN
  Administered 2020-08-15: 10 mg via INTRAVENOUS

## 2020-08-15 MED ORDER — LIDOCAINE 2% (20 MG/ML) 5 ML SYRINGE
INTRAMUSCULAR | Status: DC | PRN
  Administered 2020-08-15: 60 mg via INTRAVENOUS

## 2020-08-15 SURGICAL SUPPLY — 19 items
BAG URINE DRAIN 2000ML AR STRL (UROLOGICAL SUPPLIES) IMPLANT
BAG URO CATCHER STRL LF (MISCELLANEOUS) ×3 IMPLANT
CATH URET 5FR 28IN OPEN ENDED (CATHETERS) ×3 IMPLANT
GLOVE BIOGEL M STRL SZ7.5 (GLOVE) ×3 IMPLANT
GOWN STRL REUS W/ TWL LRG LVL3 (GOWN DISPOSABLE) ×4 IMPLANT
GOWN STRL REUS W/TWL LRG LVL3 (GOWN DISPOSABLE) ×6
GOWN STRL REUS W/TWL XL LVL3 (GOWN DISPOSABLE) ×6 IMPLANT
GUIDEWIRE ANG ZIPWIRE 038X150 (WIRE) ×3 IMPLANT
GUIDEWIRE STR DUAL SENSOR (WIRE) ×3 IMPLANT
KIT TURNOVER KIT B (KITS) ×3 IMPLANT
MANIFOLD NEPTUNE II (INSTRUMENTS) ×12 IMPLANT
NS IRRIG 1000ML POUR BTL (IV SOLUTION) ×3 IMPLANT
PACK CYSTO (CUSTOM PROCEDURE TRAY) ×3 IMPLANT
STENT URET 6FRX24 CONTOUR (STENTS) IMPLANT
STENT URET 6FRX26 CONTOUR (STENTS) ×6 IMPLANT
SYPHON OMNI JUG (MISCELLANEOUS) ×3 IMPLANT
TOWEL GREEN STERILE FF (TOWEL DISPOSABLE) ×3 IMPLANT
TUBE CONNECTING 12X1/4 (SUCTIONS) ×3 IMPLANT
WATER STERILE IRR 3000ML UROMA (IV SOLUTION) ×3 IMPLANT

## 2020-08-15 NOTE — Anesthesia Preprocedure Evaluation (Signed)
Anesthesia Evaluation  Patient identified by MRN, date of birth, ID band Patient awake    Reviewed: Allergy & Precautions, H&P , NPO status , Patient's Chart, lab work & pertinent test results  Airway Mallampati: II   Neck ROM: full    Dental   Pulmonary Current Smoker,    breath sounds clear to auscultation       Cardiovascular negative cardio ROS   Rhythm:regular Rate:Normal     Neuro/Psych  Headaches,    GI/Hepatic   Endo/Other    Renal/GU ARFRenal disease     Musculoskeletal   Abdominal   Peds  Hematology   Anesthesia Other Findings   Reproductive/Obstetrics                             Anesthesia Physical Anesthesia Plan  ASA: III and emergent  Anesthesia Plan: General   Post-op Pain Management:    Induction: Intravenous and Rapid sequence  PONV Risk Score and Plan: 2 and Ondansetron, Dexamethasone, Midazolam and Treatment may vary due to age or medical condition  Airway Management Planned: Oral ETT  Additional Equipment:   Intra-op Plan:   Post-operative Plan: Extubation in OR  Informed Consent: I have reviewed the patients History and Physical, chart, labs and discussed the procedure including the risks, benefits and alternatives for the proposed anesthesia with the patient or authorized representative who has indicated his/her understanding and acceptance.     Dental advisory given  Plan Discussed with: CRNA, Anesthesiologist and Surgeon  Anesthesia Plan Comments:         Anesthesia Quick Evaluation

## 2020-08-15 NOTE — Consult Note (Signed)
[place]  Telephone:([phone]) [phone]   HEMATOLOGY ONCOLOGY INPATIENT CONSULTATION   Marie Brewer  DOB: [birth year]  MR#: [id number]  CSN#: [id number]    Requesting Physician: [facility]  Patient Care Team: Pcp, No as PCP - General  Reason for consult: anemia   History of present illness:   40 yo female with PMH of cervical cancer, status post concurrent chemoradiation 10 years ago, NED, polysubstance abuse including smoking and IV Heriin, does not have routine health care, presented with fatigue and recurrent nausea vomiting for the past months.  In the ED, she was found to have severe anemia with hemoglobin 6.4, and renal failure with creatinine 10.73.  Patient had several ED visit in October 2021, her hemoglobin was around 7.7-8.9, creatinine was 1.09.  She received a blood transfusion, CT scan showed bilateral hydronephrosis, she underwent cystoscopy and bilateral J stent placement by urologist Dr. Lovena Neighbours today, I saw her in the PACU after her procedure.  She was little drowsy, but alert and answering question appropriately.  She complains of abdominal pain after the procedure.  She denies any fever, confusion before admission.   MEDICAL HISTORY:  Past Medical History:  Diagnosis Date  . History of cervical cancer   . Migraine headache   . Polysubstance abuse ([place]) 05/05/2020    SURGICAL HISTORY: Past Surgical History:  Procedure Laterality Date  . IRRIGATION AND DEBRIDEMENT ABSCESS Bilateral 05/05/2020   Procedure: left forearm incision and drainage and aspiration of the right wrist joint;  Surgeon: Cindra Presume, MD;  Location: [facility];  Service: Plastics;  Laterality: Bilateral;  . MANDIBLE FRACTURE SURGERY      SOCIAL HISTORY: Social History   Socioeconomic History  . Marital status: Divorced    Spouse name: Not on file  . Number of children: Not on file  . Years of education: Not on file  . Highest education level: Not on file  Occupational  History  . Not on file  Tobacco Use  . Smoking status: Current Every Day Smoker    Packs/day: 0.25    Types: Cigarettes  . Smokeless tobacco: Never Used  Vaping Use  . Vaping Use: Never used  Substance and Sexual Activity  . Alcohol use: No  . Drug use: Not Currently    Types: Heroin    Comment: Stopped 2 months ago  . Sexual activity: Not on file  Other Topics Concern  . Not on file  Social History Narrative  . Not on file   Social Determinants of Health   Financial Resource Strain: Not on file  Food Insecurity: Not on file  Transportation Needs: Not on file  Physical Activity: Not on file  Stress: Not on file  Social Connections: Not on file  Intimate Partner Violence: Not on file    FAMILY HISTORY: Family History  Problem Relation Age of Onset  . Autism spectrum disorder Other     ALLERGIES:  is allergic to amoxicillin-pot clavulanate, imitrex [sumatriptan base], and cephalexin.  MEDICATIONS:  Current Facility-Administered Medications  Medication Dose Route Frequency Provider Last Rate Last Admin  . [MAR Hold] 0.9 %  sodium chloride infusion (Manually program via Guardrails IV Fluids)   Intravenous Once Cristal Deer, MD      . Doug Sou Hold] 0.9 %  sodium chloride infusion (Manually program via Guardrails IV Fluids)   Intravenous Once Judd Gaudier V, MD      . 0.9 %  sodium chloride infusion (Manually program via Guardrails IV Fluids)  Intravenous Once Hodierne, Quita Skye, MD      . 0.9 %  sodium chloride infusion (Manually program via Guardrails IV Fluids)   Intravenous Once Hodierne, Adam, MD      . 0.9 %  sodium chloride infusion   Intravenous Continuous Cristal Deer, MD 50 mL/hr at 08/15/20 0628 New Bag at 08/15/20 2409  . [MAR Hold] diphenhydrAMINE (BENADRYL) injection 12.5 mg  12.5 mg Intravenous Q6H PRN Cristal Deer, MD      . fentaNYL (SUBLIMAZE) 100 MCG/2ML injection           . fentaNYL (SUBLIMAZE) injection 25-50 mcg  25-50 mcg Intravenous Q5 min  PRN Albertha Ghee, MD   50 mcg at 08/15/20 1328  . [MAR Hold] folic acid injection 1 mg  1 mg Intravenous Daily Hammons, Theone Murdoch, RPH      . [MAR Hold] iohexol (OMNIPAQUE) 350 MG/ML injection 50 mL  50 mL Oral Once PRN Regalado, Belkys A, MD      . magnesium sulfate IVPB 2 g 50 mL  2 g Intravenous Once Regalado, Belkys A, MD      . Doug Sou Hold] ondansetron (ZOFRAN) injection 4 mg  4 mg Intravenous Q6H PRN Regalado, Belkys A, MD      . ondansetron (ZOFRAN) injection 4 mg  4 mg Intravenous Q6H PRN Hodierne, Adam, MD      . oxyCODONE (Oxy IR/ROXICODONE) immediate release tablet 5 mg  5 mg Oral Once PRN Albertha Ghee, MD       Or  . oxyCODONE (ROXICODONE) 5 MG/5ML solution 5 mg  5 mg Oral Once PRN Albertha Ghee, MD      . Doug Sou Hold] sodium chloride flush (NS) 0.9 % injection 3 mL  3 mL Intravenous Q12H Cristal Deer, MD   3 mL at 08/14/20 1723    REVIEW OF SYSTEMS:   Constitutional: Denies fevers, chills or abnormal night sweats, (+) fatigue  Eyes: Denies blurriness of vision, double vision or watery eyes Ears, nose, mouth, throat, and face: Denies mucositis or sore throat Respiratory: Denies cough, dyspnea or wheezes Cardiovascular: Denies palpitation, chest discomfort or lower extremity swelling Gastrointestinal:  (+) Recurrent nausea and vomiting, anorexia Skin: Denies abnormal skin rashes Lymphatics: Denies new lymphadenopathy or easy bruising Neurological:Denies numbness, tingling or new weaknesses Behavioral/Psych: Mood is stable, no new changes  All other systems were reviewed with the patient and are negative.  PHYSICAL EXAMINATION:  Vitals:   08/15/20 1330 08/15/20 1345  BP: (!) 148/77 (!) 145/82  Pulse: 99 79  Resp: 12 11  Temp:  97.7 F (36.5 C)  SpO2: 100% 100%   There were no vitals filed for this visit.  GENERAL:alert, no distress and comfortable SKIN: skin color, texture, turgor are normal, no rashes or significant lesions, except skin hyperpigmentation on  bilateral lower extremities, more on the left EYES: normal, conjunctiva are pink and non-injected, sclera clear OROPHARYNX:no exudate, no erythema and lips, buccal mucosa, and tongue normal  NECK: supple, thyroid normal size, non-tender, without nodularity LYMPH:  no palpable lymphadenopathy in the cervical, axillary or inguinal LUNGS: clear to auscultation and percussion with normal breathing effort HEART: regular rate & rhythm and no murmurs and no lower extremity edema ABDOMEN:abdomen soft, mild diffuse tenderness Musculoskeletal:no cyanosis of digits and no clubbing  PSYCH: alert & oriented x 3 with fluent speech NEURO: no focal motor/sensory deficits  LABORATORY DATA:  I have reviewed the data as listed Lab Results  Component Value Date   WBC 13.9 (H) 08/15/2020  HGB 7.1 (L) 08/15/2020   HCT 21.0 (L) 08/15/2020   MCV 90.5 08/15/2020   PLT 274 08/15/2020   Recent Labs    05/09/20 0500 05/10/20 0529 05/10/20 1404 08/14/20 0318 08/14/20 1432 08/15/20 0300 08/15/20 1241  NA 132* 136  --  131*  --  132* 132*  K 3.1* 2.9*   < > 3.1*  --  3.0* 4.1  CL 100 103  --  87*  --  92* 95*  CO2 23 25  --  24  --  22  --   GLUCOSE 98 108*  --  102*  --  96 81  BUN 12 12  --  114*  --  106* 124*  CREATININE 1.09* 1.09*  --  10.73* 8.19* 10.02* 9.00*  CALCIUM 7.4* 7.6*  --  7.3*  --  7.1*  --   GFRNONAA >60 >60  --  4* 6* 5*  --   PROT 7.9  --   --  8.8*  --  7.5  --   ALBUMIN 2.0*  --   --  2.6*  --  2.3*  --   AST 17  --   --  11*  --  9*  --   ALT 12  --   --  9  --  8  --   ALKPHOS 130*  --   --  111  --  91  --   BILITOT 0.7  --   --  0.8  --  0.6  --    < > = values in this interval not displayed.    RADIOGRAPHIC STUDIES: I have personally reviewed the radiological images as listed and agreed with the findings in the report. CT ABDOMEN WO CONTRAST  Result Date: 08/15/2020 CLINICAL DATA:  Possible esophageal perforation. Pneumomediastinum on recent abdominal CT. Nausea  and vomiting. Acute on chronic anemia. History of heroin abuse. EXAM: CT CHEST AND ABDOMEN WITHOUT CONTRAST TECHNIQUE: Multidetector CT imaging of the chest and abdomen was performed following the standard protocol without intravenous contrast. Patient was given 50 mL Omnipaque 350 dilute and 12 ounces of water orally immediately before scanning. COMPARISON:  Noncontrast abdominopelvic CT earlier today as well as previous abdominal CT 11/13/2017 FINDINGS: CT CHEST FINDINGS WITHOUT CONTRAST Cardiovascular: Mild cardiomegaly. Thoracic aorta is otherwise unremarkable. Remaining vascular structures are unremarkable. Mediastinum/Nodes: Evidence of moderate pneumomediastinum throughout the chest. As this is visualized on the most superior image at the neck base extends through the chest to the diaphragm. Minimal cervical wall thickening throughout the esophagus. Contrast is present throughout the course of the esophagus without evidence of extravasation of contrast. No definite focal esophageal injury is identified. No mediastinal or hilar adenopathy. Remaining mediastinal structures are unremarkable. Lungs/Pleura: Lungs are well inflated without focal airspace consolidation or effusion. No pneumothorax. Small thin-walled cysts over the medial right apex. Focal debris over the dependent portion of the upper trachea. Airways are normal. Musculoskeletal: No acute findings. CT ABDOMEN FINDINGS WITHOUT CONTRAST Hepatobiliary: Previous cholecystectomy. Liver and biliary tree are unremarkable. Pancreas: No change in recently seen somewhat heterogeneous low-attenuation over the pancreatic head stable compared to the previous exam from 2019. No perinephric inflammation or fluid. Spleen: Normal. Adrenals/Urinary Tract: Adrenal glands are normal. Kidneys are normal in size with mild bilateral hydronephrosis unchanged. Subcapsular low-attenuation over the upper pole right kidney unchanged. Minimal left perinephric fluid unchanged.  No renal stones. Visualized ureters are unremarkable. Stomach/Bowel: Contrast within the stomach which is otherwise unremarkable. Visualized bowel is unremarkable.  Vascular/Lymphatic: Abdominal aorta is normal caliber. Minimal calcified plaque over the proximal abdominal aorta. No adenopathy. Other: No other changes. Musculoskeletal: No acute findings. IMPRESSION: 1. Moderate amount of pneumomediastinum throughout the chest. No definite focal esophageal injury identified. No definite etiology visualized for this pneumomediastinum. 2. Subcapsular low-density collection over the upper pole right kidney with bilateral hydronephrosis as these findings are stable. Findings could be seen due to infection. Recommend clinical correlation. 3. Moderate focal dependent debris over the upper trachea which may be due to aspiration versus posttraumatic. 4. Mild heterogeneous low-attenuation over the pancreatic head stable compared to the previous exam from 2019. Pancreatic mass is possible. Continue to recommend further evaluation on elective basis with MRI. 5. Aortic atherosclerosis. Aortic Atherosclerosis (ICD10-I70.0). Electronically Signed   By: Marin Olp M.D.   On: 08/15/2020 12:17   CT CHEST WO CONTRAST  Result Date: 08/15/2020 CLINICAL DATA:  Possible esophageal perforation. Pneumomediastinum on recent abdominal CT. Nausea and vomiting. Acute on chronic anemia. History of heroin abuse. EXAM: CT CHEST AND ABDOMEN WITHOUT CONTRAST TECHNIQUE: Multidetector CT imaging of the chest and abdomen was performed following the standard protocol without intravenous contrast. Patient was given 50 mL Omnipaque 350 dilute and 12 ounces of water orally immediately before scanning. COMPARISON:  Noncontrast abdominopelvic CT earlier today as well as previous abdominal CT 11/13/2017 FINDINGS: CT CHEST FINDINGS WITHOUT CONTRAST Cardiovascular: Mild cardiomegaly. Thoracic aorta is otherwise unremarkable. Remaining vascular structures  are unremarkable. Mediastinum/Nodes: Evidence of moderate pneumomediastinum throughout the chest. As this is visualized on the most superior image at the neck base extends through the chest to the diaphragm. Minimal cervical wall thickening throughout the esophagus. Contrast is present throughout the course of the esophagus without evidence of extravasation of contrast. No definite focal esophageal injury is identified. No mediastinal or hilar adenopathy. Remaining mediastinal structures are unremarkable. Lungs/Pleura: Lungs are well inflated without focal airspace consolidation or effusion. No pneumothorax. Small thin-walled cysts over the medial right apex. Focal debris over the dependent portion of the upper trachea. Airways are normal. Musculoskeletal: No acute findings. CT ABDOMEN FINDINGS WITHOUT CONTRAST Hepatobiliary: Previous cholecystectomy. Liver and biliary tree are unremarkable. Pancreas: No change in recently seen somewhat heterogeneous low-attenuation over the pancreatic head stable compared to the previous exam from 2019. No perinephric inflammation or fluid. Spleen: Normal. Adrenals/Urinary Tract: Adrenal glands are normal. Kidneys are normal in size with mild bilateral hydronephrosis unchanged. Subcapsular low-attenuation over the upper pole right kidney unchanged. Minimal left perinephric fluid unchanged. No renal stones. Visualized ureters are unremarkable. Stomach/Bowel: Contrast within the stomach which is otherwise unremarkable. Visualized bowel is unremarkable. Vascular/Lymphatic: Abdominal aorta is normal caliber. Minimal calcified plaque over the proximal abdominal aorta. No adenopathy. Other: No other changes. Musculoskeletal: No acute findings. IMPRESSION: 1. Moderate amount of pneumomediastinum throughout the chest. No definite focal esophageal injury identified. No definite etiology visualized for this pneumomediastinum. 2. Subcapsular low-density collection over the upper pole right  kidney with bilateral hydronephrosis as these findings are stable. Findings could be seen due to infection. Recommend clinical correlation. 3. Moderate focal dependent debris over the upper trachea which may be due to aspiration versus posttraumatic. 4. Mild heterogeneous low-attenuation over the pancreatic head stable compared to the previous exam from 2019. Pancreatic mass is possible. Continue to recommend further evaluation on elective basis with MRI. 5. Aortic atherosclerosis. Aortic Atherosclerosis (ICD10-I70.0). Electronically Signed   By: Marin Olp M.D.   On: 08/15/2020 12:17   US Renal  Result Date: 08/14/2020 CLINICAL DATA:  40 year old  female with acute renal failure. EXAM: RENAL / URINARY TRACT ULTRASOUND COMPLETE COMPARISON:  CT Abdomen and Pelvis 11/13/2017. FINDINGS: Right Kidney: Renal measurements: 11.1 x 5.0 x 5.7 cm = volume: 166 mL. Echogenic right renal cortex (image 4). Mild to moderate right hydronephrosis (image 9). No right renal mass or definite cyst. Left Kidney: Renal measurements: Left kidney is difficult to visualize and might be obscured by chronic renal cortical insufficiency (that measured on image 12 might not entirely be renal parenchyma (12 cm in length). No left hydronephrosis. No definite left renal mass. Bladder: Diminutive, unremarkable. Other: IMPRESSION: 1. Echogenic right kidney suggesting chronic medical renal disease but with superimposed mild to moderate right hydronephrosis. 2. No left hydronephrosis although the left kidney is poorly delineated by ultrasound. Possible left renal atrophy. 3. Follow-up noncontrast CT Abdomen and Pelvis recommended. Electronically Signed   By: Genevie Ann M.D.   On: 08/14/2020 07:26   CT RENAL STONE STUDY  Addendum Date: 08/15/2020   ADDENDUM REPORT: 08/15/2020 10:57 ADDENDUM: These results were called by telephone at the time of interpretation on 08/15/2020 at 10:43 a.m. to provider [facility] , who verbally acknowledged  these results. Electronically Signed   By: Marin Olp M.D.   On: 08/15/2020 10:57   Result Date: 08/15/2020 CLINICAL DATA:  Acute renal failure. Heroin abuse. Nausea and vomiting with acute on chronic anemia. EXAM: CT ABDOMEN AND PELVIS WITHOUT CONTRAST TECHNIQUE: Multidetector CT imaging of the abdomen and pelvis was performed following the standard protocol without IV contrast. COMPARISON:  11/13/2017 FINDINGS: Lower chest: Lung bases are clear. There is evidence of pneumomediastinum with air seen adjacent the anterior heart and distal esophagus. Mild cervical true wall thickening of the distal esophagus. Hepatobiliary: Previous cholecystectomy. Liver and biliary tree are within normal. Pancreas: Subtle low-attenuation in the region of the pancreatic head without significant change. No adjacent inflammatory change or free fluid. Spleen: Normal. Adrenals/Urinary Tract: Adrenal glands are normal. Kidneys are normal in size with bilateral mild hydronephrosis right greater than left. No nephrolithiasis. There is subcapsular low-attenuation over the upper pole right kidney. Subtle patchy appearance to the renal cortex bilaterally. These findings may be due to infection. There is minimal fluid over the left perinephric region. No evidence of ureteral stones. Bladder is normal. Stomach/Bowel: Stomach and small bowel are normal. Appendix is normal. Colon is normal. Vascular/Lymphatic: Abdominal aorta is normal in caliber. There is minimal calcified plaque over the abdominal aorta. No evidence of adenopathy. Reproductive: Normal. Other: No evidence of free fluid or focal inflammatory change. Musculoskeletal: Mild sclerosis along the sacroiliac joints. IMPRESSION: 1. Evidence of pneumomediastinum. Mild wall thickening of the distal esophagus. Recommend clinical correlation as findings may be due to patient's recent vomiting episodes. 2. Mild bilateral hydronephrosis right greater than left. No nephrolithiasis or  ureteral stones. Subtle patchy appearance to the renal cortex bilaterally with subcapsular low-attenuation over the upper pole right kidney. Findings may be due to infection. Recommend clinical correlation and follow-up imaging as clinically indicated. 3. Subtle indeterminate low-attenuation in the region of the pancreatic head without significant change. Consider MRI for further evaluation to exclude pancreatic mass. 4. Aortic atherosclerosis. Aortic Atherosclerosis (ICD10-I70.0). Electronically Signed: By: Marin Olp M.D. On: 08/15/2020 10:35    ASSESSMENT & PLAN: 40 yo female   1. AKI 2. B/l hydronephrosis, status post ureteral stent placement 3. Severe anemia, hypoproductive, normocytic  4. Folic deficiency  5.  Polysubstance abuse, including IV heroine 6.  History of cervical cancer, status post chemoradiation with  cisplatin 10 years ago at [facility].  Moderate amount of pneumomediastinum, without evidence of esophageal perforation or chest injury  8. ? Pancreatic head mass, stable since 2019, unlikely malignancy  9. UTI 10. Leukocytosis likely reactive secondary to UTI  Recommendations: -I have reviewed her lab results and peripheral smear, which showed polychromasia  But no schistocytes on smear.  WBC and platelets unremarkable.  Her reticulocyte count is low, there is no lab evidence of hemolysis. -Her anemia has been chronic, at least from 2019 (Hg 11.8), getting worse since 04/2020 (Hg 10.5- 7.7), likely anemia of chronic disease, although her renal failure appears to be acute. She does have folic deficiency, and is on supplement now -will order MM panel, she does have elevated total protein and low albumin, MM is a possibility, but calcium level is low, pt does not have clinical bone pain. Will also collect 24hr urine for UPEP and light chain level, and bone survey -if the above workup negative, and she has persistent anemia, I may obtain a bone marrow biopsy  -blood transfusion to  keep Hg>7.0  -please add wbc diff tomorrow  -I will follow up     All questions were answered. The patient knows to call the clinic with any problems, questions or concerns.      Truitt Merle, MD 08/15/2020 1:47 PM

## 2020-08-15 NOTE — Progress Notes (Signed)
PROGRESS NOTE    Marie Brewer  FHQ:[id number] DOB: 01-Jan-1981 DOA: 08/14/2020 PCP: Pcp, No   Brief Narrative: 40 year old with past medical history significant for polysubstance abuse, remote history of uterine and cervical cancer s/p surgery abd radiation,  anemia.  She reports that she stopped using heroin 2 months ago.  She report nausea and vomiting for the past month.  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  Evaluation in the ED patient was found to be severely anemic with a hemoglobin of 5, creatinine of 10.  Patient received IV fluids, antiemetic triad was called for admission  During evaluation for acute renal failure CT renal protocol showed pneumomediastinum, mild bilateral hydronephrosis right greater than left.  Subtle patchy appearance to the renal cortex bilaterally with subcapsular low-attenuation over the upper pole right kidney.  Finding may be due to infection.  Recommend clinical correlation and follow-up imaging as clinical indicated.  Subsequently CT chest and abdomen was obtained which showed moderate amount of pneumomediastinum throughout the chest.  No definitive focal esophageal injury identified.  Focal dependent debris's over the upper trachea which may be due to aspiration versus posttraumatic.  Mild heterogeneous low attenuation over the pancreatic head stable compared to previous exam.  Pancreatic mass is possible.  Recommend MRI on elective basis.   CVTS was consulted and recommended patient to be n.p.o. and started on IV Zosyn.  Urology was consulted and is planning retrograde pyelogram and bilateral JJ stent placement.   Assessment & Plan:   Active Problems:   Polysubstance abuse (HCC)   ARF (acute renal failure) (HCC)   Acute renal failure (ARF) (HCC)  1-Severe Anemia: Patient presents with hb 6---5.  Received one unit PRBC, hb increase 7.  Urology  planning two more unit pre op.  Hematology consulted. LHD, peripheral smear, haptoglobin, DIC panel.  Patient denies melena, hematochezia.  Started folic acid.   2-Acute Renal failure;  -Prior cr per records 1.3. -Presents with Cr at 10.  -AKI: combination obstructive and pre renal.  -Renal US; showed right hydronephrosis, no left  hydronephrosis although left kidney is poorly delineated.  -CT stone; mild bilateral hydronephrosis right greater than left.  No nephrolithiasis or ureteral stone.  Subtle patchy appearance to the renal cortex bilaterally with subcapsular low-attenuation over the upper pole right kidney.  Finding may be due to infection. -For retrograde pyelogram and bilateral JJ stent placement 1/16..  -Continue with IV fluids.   3-Pneumomediastinum; -Incidental finding on CT renal protocol. -CT chest and abdomen: Moderate amount of pneumomediastinum throughout the chest.  Not definitive focal esophageal injury 25.  No definitive etiology is to live for this pneumomediastinum. -Continue with n.p.o. status. -Plan to start  IV Zosyn. -CVTS consulted, Appreciate Dr Roxy Manns evaluation and recommendations.   4-Abnormal attenuation pancreas on CT;  -Patient will need MRI to rule out pancreatic Mass.   5-Hyponatremia; continue with IV fluids.  Hypomagnesemia; replete IV> repeat labs in am.   6-Hypokalemia; replaced.   7-UTI; continue with IV zosyn.  Urine culture growing strep agalactiae.        Estimated body mass index is 39.12 kg/m as calculated from the following:   Height as of 05/09/20: 5\' 6"  (1.676 m).   Weight as of 05/09/20: 110 kg.   DVT prophylaxis: SCD Code Status: Full code Family Communication: care discussed with patient Disposition Plan:  Status is: Inpatient  Remains inpatient appropriate  because:Hemodynamically unstable   Dispo: The patient is from: Home              Anticipated d/c is to: Home              Anticipated d/c date is: 3 days               Patient currently is not medically stable to d/c.        Consultants:   Urology  Nephrology  Hematology  CVTS  Procedures:   Renal US.     Antimicrobials:    Subjective: She is not feeling well, report nausea and vomiting every time she tries to eat.  Denies abdominal pain.  She had few episodes of diarrhea.   Objective: Vitals:   08/14/20 2144 08/15/20 0413 08/15/20 0604 08/15/20 0627  BP: (!) 159/93 (!) 154/99 (!) 146/77 (!) 152/81  Pulse: 79 79 80 78  Resp: 18 16 14 14   Temp: 98.5 F (36.9 C) 98.9 F (37.2 C) 98.2 F (36.8 C) 98.4 F (36.9 C)  TempSrc: Oral Oral Oral Oral  SpO2: 100% 100% 100% 100%    Intake/Output Summary (Last 24 hours) at 08/15/2020 0725 Last data filed at 08/15/2020 0506 Gross per 24 hour  Intake 963.38 ml  Output 400 ml  Net 563.38 ml   There were no vitals filed for this visit.  Examination:  General exam: Appears calm and comfortable  Respiratory system: Clear to auscultation. Respiratory effort normal. Cardiovascular system: S1 & S2 heard, RRR. No JVD, murmurs, rubs, gallops or clicks. No pedal edema. Gastrointestinal system: Abdomen is nondistended, soft and nontender. Normal bowel sounds heard. Central nervous system: Alert and oriented.  Extremities: Symmetric 5 x 5 power.    Data Reviewed: I have personally reviewed following labs and imaging studies  CBC: Recent Labs  Lab 08/14/20 0318 08/14/20 0632 08/15/20 0300  WBC 15.7* 16.7* 13.9*  NEUTROABS  --  14.5*  --   HGB 6.4* 4.6* 5.3*  HCT 20.6* 15.0* 17.2*  MCV 90.7 91.5 90.5  PLT 348 343 701   Basic Metabolic Panel: Recent Labs  Lab 08/14/20 0318 08/14/20 0632 08/14/20 1432 08/15/20 0300  NA 131*  --   --  132*  K 3.1*  --   --  3.0*  CL 87*  --   --  92*  CO2 24  --   --  22  GLUCOSE 102*  --   --  96  BUN 114*  --   --  106*  CREATININE 10.73*  --  8.19* 10.02*  CALCIUM 7.3*  --   --  7.1*  MG  --  1.2*  --   --    GFR: CrCl  cannot be calculated (Unknown ideal weight.). Liver Function Tests: Recent Labs  Lab 08/14/20 0318 08/15/20 0300  AST 11* 9*  ALT 9 8  ALKPHOS 111 91  BILITOT 0.8 0.6  PROT 8.8* 7.5  ALBUMIN 2.6* 2.3*   Recent Labs  Lab 08/14/20 0318  LIPASE 82*   No results for input(s): AMMONIA in the last 168 hours. Coagulation Profile: No results for input(s): INR, PROTIME in the last 168 hours. Cardiac Enzymes: No results for input(s): CKTOTAL, CKMB, CKMBINDEX, TROPONINI in the last 168 hours. BNP (last 3 results) No results for input(s): PROBNP in the last 8760 hours. HbA1C: No results for input(s): HGBA1C in the last 72 hours. CBG: No results for input(s): GLUCAP in the last 168 hours. Lipid Profile: No results  for input(s): CHOL, HDL, LDLCALC, TRIG, CHOLHDL, LDLDIRECT in the last 72 hours. Thyroid Function Tests: No results for input(s): TSH, T4TOTAL, FREET4, T3FREE, THYROIDAB in the last 72 hours. Anemia Panel: Recent Labs    08/15/20 0300  VITAMINB12 537  FOLATE 3.6*  FERRITIN 495*  TIBC 203*  IRON 58  RETICCTPCT 2.3   Sepsis Labs: Recent Labs  Lab 08/14/20 0635 08/14/20 1021  LATICACIDVEN 1.4 1.1    Recent Results (from the past 240 hour(s))  Resp Panel by RT-PCR (Flu A&B, Covid) Nasopharyngeal Swab     Status: None   Collection Time: 08/14/20  6:00 AM   Specimen: Nasopharyngeal Swab; Nasopharyngeal(NP) swabs in vial transport medium  Result Value Ref Range Status   SARS Coronavirus 2 by RT PCR NEGATIVE NEGATIVE Final    Comment: (NOTE) SARS-CoV-2 target nucleic acids are NOT DETECTED.  The SARS-CoV-2 RNA is generally detectable in upper respiratory specimens during the acute phase of infection. The lowest concentration of SARS-CoV-2 viral copies this assay can detect is 138 copies/mL. A negative result does not preclude SARS-Cov-2 infection and should not be used as the sole basis for treatment or other patient management decisions. A negative result may  occur with  improper specimen collection/handling, submission of specimen other than nasopharyngeal swab, presence of viral mutation(s) within the areas targeted by this assay, and inadequate number of viral copies(<138 copies/mL). A negative result must be combined with clinical observations, patient history, and epidemiological information. The expected result is Negative.  Fact Sheet for Patients:  EntrepreneurPulse.com.au  Fact Sheet for Healthcare Providers:  IncredibleEmployment.be  This test is no t yet approved or cleared by the [place] FDA and  has been authorized for detection and/or diagnosis of SARS-CoV-2 by FDA under an Emergency Use Authorization (EUA). This EUA will remain  in effect (meaning this test can be used) for the duration of the COVID-19 declaration under Section 564(b)(1) of the Act, 21 U.S.C.section 360bbb-3(b)(1), unless the authorization is terminated  or revoked sooner.       Influenza A by PCR NEGATIVE NEGATIVE Final   Influenza B by PCR NEGATIVE NEGATIVE Final    Comment: (NOTE) The Xpert Xpress SARS-CoV-2/FLU/RSV plus assay is intended as an aid in the diagnosis of influenza from Nasopharyngeal swab specimens and should not be used as a sole basis for treatment. Nasal washings and aspirates are unacceptable for Xpert Xpress SARS-CoV-2/FLU/RSV testing.  Fact Sheet for Patients: EntrepreneurPulse.com.au  Fact Sheet for Healthcare Providers: IncredibleEmployment.be  This test is not yet approved or cleared by the [place] FDA and has been authorized for detection and/or diagnosis of SARS-CoV-2 by FDA under an Emergency Use Authorization (EUA). This EUA will remain in effect (meaning this test can be used) for the duration of the COVID-19 declaration under Section 564(b)(1) of the Act, 21 U.S.C. section 360bbb-3(b)(1), unless the authorization is terminated  or revoked.  Performed at [facility], [place] [address], [place], [place] [address]   Blood culture (routine x 2)     Status: None (Preliminary result)   Collection Time: 08/14/20  6:32 AM   Specimen: BLOOD  Result Value Ref Range Status   Specimen Description BLOOD SITE NOT SPECIFIED  Final   Special Requests   Final    BOTTLES DRAWN AEROBIC AND ANAEROBIC Blood Culture adequate volume   Culture   Final    NO GROWTH < 12 HOURS Performed at [facility], [place] [address] [place], [place] [address]  Report Status PENDING  Incomplete         Radiology Studies: US Renal  Result Date: 08/14/2020 CLINICAL DATA:  40 year old female with acute renal failure. EXAM: RENAL / URINARY TRACT ULTRASOUND COMPLETE COMPARISON:  CT Abdomen and Pelvis 11/13/2017. FINDINGS: Right Kidney: Renal measurements: 11.1 x 5.0 x 5.7 cm = volume: 166 mL. Echogenic right renal cortex (image 4). Mild to moderate right hydronephrosis (image 9). No right renal mass or definite cyst. Left Kidney: Renal measurements: Left kidney is difficult to visualize and might be obscured by chronic renal cortical insufficiency (that measured on image 12 might not entirely be renal parenchyma (12 cm in length). No left hydronephrosis. No definite left renal mass. Bladder: Diminutive, unremarkable. Other: IMPRESSION: 1. Echogenic right kidney suggesting chronic medical renal disease but with superimposed mild to moderate right hydronephrosis. 2. No left hydronephrosis although the left kidney is poorly delineated by ultrasound. Possible left renal atrophy. 3. Follow-up noncontrast CT Abdomen and Pelvis recommended. Electronically Signed   By: Genevie Ann M.D.   On: 08/14/2020 07:26        Scheduled Meds: . sodium chloride   Intravenous Once  . sodium chloride   Intravenous Once  . enoxaparin (LOVENOX) injection  30 mg Subcutaneous Q24H  . ferrous sulfate  325 mg Oral BID WC  . sodium chloride flush  3 mL Intravenous  Q12H   Continuous Infusions: . sodium chloride 50 mL/hr at 08/15/20 0628  . cefTRIAXone (ROCEPHIN)  IV       LOS: 1 day    Time spent: 35 minutes.     Elmarie Shiley, MD [facility]   If 7PM-7AM, please contact night-coverage [email or web address]  08/15/2020, 7:25 AM

## 2020-08-15 NOTE — Op Note (Signed)
Operative Note  Preoperative diagnosis:  1. Bilateral hydronephrosis 2. Acute renal failure 3. History of cervical cancer treated with chemoradiation  Postoperative diagnosis: 1. Bilateral hydronephrosis 2. Acute renal failure 3. History of cervical cancer treated with chemoradiation  Procedure(s): 1. Cystoscopy with bilateral ureteral stent placement 2. Bilateral retrograde pyelograms with intraoperative interpretation of fluoroscopic imaging  Surgeon: Ellison Hughs, MD  Assistants:  None  Anesthesia:  General  Complications:  None  EBL: Less than 5 mL  Specimens: 1. None  Drains/Catheters: 1. Bilateral 6 French, 26 cm JJ stents without tethers  Intraoperative findings:   1. Bilateral retrograde pyelograms revealed stenosis of the mid and distal aspects of the ureters with uniform proximal ureteral dilation as well as dilation of both renal pelvis ease and their associated calyces. No intraluminal filling defects were seen within the upper urinary tracts, bilaterally  Indication:  Marie Brewer is a 40 y.o. female with a history of cervical cancer treated with chemo and radiation approximately 10 years ago. She presented with acute renal failure with a creatinine of 10.0 as well as bilateral hydronephrosis on CT. She has been consented for the above procedures, voiced understanding and wishes to proceed.  Description of procedure:  After informed consent was obtained, the patient was brought to the operating room and general LMA anesthesia was administered. The patient was then placed in the dorsolithotomy position and prepped and draped in the usual sterile fashion. A timeout was performed. A 23 French rigid cystoscope was then inserted into the urethral meatus and advanced into the bladder under direct vision. A complete bladder survey revealed no intravesical pathology.  A 5 French ureteral catheter was then inserted into the right ureteral orifice and a retrograde  pyelogram was obtained, with the findings listed above.  A Glidewire was then used to intubate the lumen of the ureteral catheter and was advanced up to the right renal pelvis, under fluoroscopic guidance.  The catheter was then removed, leaving the wire in place. A 6 French, 26 cm JJ stent was then advanced over the wire and into good position, confirming placement via fluoroscopy.  A 5 French ureteral catheter was then inserted into the left ureteral orifice and a retrograde pyelogram was obtained, with the findings listed above.  A Glidewire was then used to intubate the lumen of the ureteral catheter and was advanced up to the left renal pelvis, under fluoroscopic guidance.  The catheter was then removed, leaving the wire in place. A 6 French, 26 cm JJ stent was then advanced over the wire and into good position within the left collecting system, confirming placement via fluoroscopy.  A 16 French Foley catheter was then inserted with return of clear irrigant. The patient tolerated the procedure well and was transferred to the postanesthesia in stable condition.  Plan: Keep n.p.o. per Dr. Roxy Manns. Foley catheter left in place to monitor urine output. Okay to remove at any time.

## 2020-08-15 NOTE — Consult Note (Signed)
Urology Consult   Physician requesting consult: Carter Kitten, MD  Reason for consult: Bilateral hydronephrosis  History of Present Illness: Marie Brewer is a 40 y.o. female with a history of cervical and jaw cancer, both treated with chemoradiation approximately 10 years ago.  The patient presents with a 1 week history of progressive weakness, dull lower back pain, nausea/vomiting and low urine output.  Initial serum creatinine performed in the emergency department was found to be 10.0.  Renal ultrasound from 05/15/2021 revealed right-sided hydronephrosis with a poorly visualized left kidney.  Subsequent CT stone study revealed mild to moderate bilateral hydronephrosis, but was otherwise unremarkable from a GU standpoint.  From a urinary standpoint, the patient reports that she only voids approximately 2 times per day and this has been going on for the past several weeks.  She denies dysuria or gross hematuria.  She also denies subjective fevers at home.    Past Medical History:  Diagnosis Date  . History of cervical cancer   . Migraine headache   . Polysubstance abuse ([place]) 05/05/2020    Past Surgical History:  Procedure Laterality Date  . IRRIGATION AND DEBRIDEMENT ABSCESS Bilateral 05/05/2020   Procedure: left forearm incision and drainage and aspiration of the right wrist joint;  Surgeon: Cindra Presume, MD;  Location: [facility];  Service: Plastics;  Laterality: Bilateral;  . [facility] Medications:  Home Meds:  No outpatient medications have been marked as taking for the 08/14/20 encounter [facility] Encounter).    Scheduled Meds: . sodium chloride   Intravenous Once  . sodium chloride   Intravenous Once  . folic acid  1 mg Intravenous Daily  . sodium chloride flush  3 mL Intravenous Q12H   Continuous Infusions: . sodium chloride 50 mL/hr at 08/15/20 0628  . cefTRIAXone (ROCEPHIN)  IV 2 g (08/15/20 0944)   PRN Meds:.diphenhydrAMINE,  iohexol, ondansetron (ZOFRAN) IV  Allergies:  Allergies  Allergen Reactions  . Amoxicillin-Pot Clavulanate Anaphylaxis  . Imitrex [Sumatriptan Base] Other (See Comments)    Heart races  . Cephalexin Rash    Family History  Problem Relation Age of Onset  . Autism spectrum disorder Other     Social History:  reports that she has been smoking. She has never used smokeless tobacco. She reports that she does not drink alcohol and does not use drugs.  ROS: A complete review of systems was performed.  All systems are negative except for pertinent findings as noted.  Physical Exam:  Vital signs in last 24 hours: Temp:  [97.4 F (36.3 C)-99 F (37.2 C)] 99 F (37.2 C) (01/16 0931) Pulse Rate:  [70-85] 85 (01/16 0931) Resp:  [14-18] 17 (01/16 0931) BP: (140-159)/(77-99) 141/82 (01/16 0931) SpO2:  [99 %-100 %] 99 % (01/16 0931) Constitutional:  Alert and oriented, No acute distress Cardiovascular: Regular rate and rhythm, No JVD Respiratory: Normal respiratory effort, Lungs clear bilaterally GI: Abdomen is soft, nontender, nondistended, no abdominal masses GU: No CVA tenderness Lymphatic: No lymphadenopathy Neurologic: Grossly intact, no focal deficits Psychiatric: Normal mood and affect  Laboratory Data:  Recent Labs    08/14/20 0318 08/14/20 0632 08/15/20 0300  WBC 15.7* 16.7* 13.9*  HGB 6.4* 4.6* 5.3*  HCT 20.6* 15.0* 17.2*  PLT 348 343 274    Recent Labs    08/14/20 0318 08/14/20 1432 08/15/20 0300  NA 131*  --  132*  K 3.1*  --  3.0*  CL 87*  --  92*  GLUCOSE 102*  --  96  BUN 114*  --  106*  CALCIUM 7.3*  --  7.1*  CREATININE 10.73* 8.19* 10.02*     Results for orders placed or performed during the hospital encounter of 08/14/20 (from the past 24 hour(s))  Creatinine, serum     Status: Abnormal   Collection Time: 08/14/20  2:32 PM  Result Value Ref Range   Creatinine, Ser 8.19 (H) 0.44 - 1.00 mg/dL   GFR, Estimated 6 (L) >60 mL/min  Type and screen  [place]     Status: None (Preliminary result)   Collection Time: 08/14/20  2:40 PM  Result Value Ref Range   ABO/RH(D) O NEG    Antibody Screen NEG    Sample Expiration 08/17/2020,2359    Weak D POS    Unit Number [id number]    Blood Component Type RED CELLS,LR    Unit division 00    Status of Unit ISSUED    Transfusion Status OK TO TRANSFUSE    Crossmatch Result      Compatible Performed at [facility], [address], [place], [place] [address]   Sodium, urine, random     Status: None   Collection Time: 08/15/20 12:54 AM  Result Value Ref Range   Sodium, Ur 44 mmol/L  Creatinine, urine, random     Status: None   Collection Time: 08/15/20 12:54 AM  Result Value Ref Range   Creatinine, Urine 69.54 mg/dL  Protein / creatinine ratio, urine     Status: Abnormal   Collection Time: 08/15/20 12:54 AM  Result Value Ref Range   Creatinine, Urine 67.84 mg/dL   Total Protein, Urine 269 mg/dL   Protein Creatinine Ratio 3.97 (H) 0.00 - 0.15 mg/mg[Cre]  Comprehensive metabolic panel     Status: Abnormal   Collection Time: 08/15/20  3:00 AM  Result Value Ref Range   Sodium 132 (L) 135 - 145 mmol/L   Potassium 3.0 (L) 3.5 - 5.1 mmol/L   Chloride 92 (L) 98 - 111 mmol/L   CO2 22 22 - 32 mmol/L   Glucose, Bld 96 70 - 99 mg/dL   BUN 106 (H) 6 - 20 mg/dL   Creatinine, Ser 10.02 (H) 0.44 - 1.00 mg/dL   Calcium 7.1 (L) 8.9 - 10.3 mg/dL   Total Protein 7.5 6.5 - 8.1 g/dL   Albumin 2.3 (L) 3.5 - 5.0 g/dL   AST 9 (L) 15 - 41 U/L   ALT 8 0 - 44 U/L   Alkaline Phosphatase 91 38 - 126 U/L   Total Bilirubin 0.6 0.3 - 1.2 mg/dL   GFR, Estimated 5 (L) >60 mL/min   Anion gap 18 (H) 5 - 15  CBC     Status: Abnormal   Collection Time: 08/15/20  3:00 AM  Result Value Ref Range   WBC 13.9 (H) 4.0 - 10.5 K/uL   RBC 1.90 (L) 3.87 - 5.11 MIL/uL   Hemoglobin 5.3 (LL) 12.0 - 15.0 g/dL   HCT 17.2 (L) 36.0 - 46.0 %   MCV 90.5 80.0 - 100.0 fL   MCH 27.9 26.0 - 34.0 pg    MCHC 30.8 30.0 - 36.0 g/dL   RDW 14.8 11.5 - 15.5 %   Platelets 274 150 - 400 K/uL   nRBC 0.0 0.0 - 0.2 %  Hepatitis panel, acute     Status: None (Preliminary result)   Collection Time: 08/15/20  3:00 AM  Result Value Ref  Range   Hepatitis B Surface Ag NON REACTIVE NON REACTIVE   HCV Ab PENDING NON REACTIVE   Hep A IgM PENDING NON REACTIVE   Hep B C IgM PENDING NON REACTIVE  Vitamin B12     Status: None   Collection Time: 08/15/20  3:00 AM  Result Value Ref Range   Vitamin B-12 537 180 - 914 pg/mL  Folate     Status: Abnormal   Collection Time: 08/15/20  3:00 AM  Result Value Ref Range   Folate 3.6 (L) >5.9 ng/mL  Iron and TIBC     Status: Abnormal   Collection Time: 08/15/20  3:00 AM  Result Value Ref Range   Iron 58 28 - 170 ug/dL   TIBC 203 (L) 250 - 450 ug/dL   Saturation Ratios 29 10.4 - 31.8 %   UIBC 145 ug/dL  Ferritin     Status: Abnormal   Collection Time: 08/15/20  3:00 AM  Result Value Ref Range   Ferritin 495 (H) 11 - 307 ng/mL  Reticulocytes     Status: Abnormal   Collection Time: 08/15/20  3:00 AM  Result Value Ref Range   Retic Ct Pct 2.3 0.4 - 3.1 %   RBC. 1.82 (L) 3.87 - 5.11 MIL/uL   Retic Count, Absolute 42.4 19.0 - 186.0 K/uL   Immature Retic Fract 13.1 2.3 - 15.9 %  Prepare RBC (crossmatch)     Status: None   Collection Time: 08/15/20  4:19 AM  Result Value Ref Range   Order Confirmation      ORDER PROCESSED BY BLOOD BANK Performed at [facility], [address] [place], [place] [address]    Recent Results (from the past 240 hour(s))  Urine culture     Status: Abnormal (Preliminary result)   Collection Time: 08/14/20  5:20 AM   Specimen: Urine, Random  Result Value Ref Range Status   Specimen Description URINE, RANDOM  Final   Special Requests NONE  Final   Culture (A)  Final    >=100,000 COLONIES/mL GRAM NEGATIVE RODS SUSCEPTIBILITIES TO FOLLOW CULTURE REINCUBATED FOR BETTER GROWTH Performed at [facility], [address], [place], [place] [address]    Report Status PENDING  Incomplete  Resp Panel by RT-PCR (Flu A&B, Covid) Nasopharyngeal Swab     Status: None   Collection Time: 08/14/20  6:00 AM   Specimen: Nasopharyngeal Swab; Nasopharyngeal(NP) swabs in vial transport medium  Result Value Ref Range Status   SARS Coronavirus 2 by RT PCR NEGATIVE NEGATIVE Final    Comment: (NOTE) SARS-CoV-2 target nucleic acids are NOT DETECTED.  The SARS-CoV-2 RNA is generally detectable in upper respiratory specimens during the acute phase of infection. The lowest concentration of SARS-CoV-2 viral copies this assay can detect is 138 copies/mL. A negative result does not preclude SARS-Cov-2 infection and should not be used as the sole basis for treatment or other patient management decisions. A negative result may occur with  improper specimen collection/handling, submission of specimen other than nasopharyngeal swab, presence of viral mutation(s) within the areas targeted by this assay, and inadequate number of viral copies(<138 copies/mL). A negative result must be combined with clinical observations, patient history, and epidemiological information. The expected result is Negative.  Fact Sheet for Patients:  EntrepreneurPulse.com.au  Fact Sheet for Healthcare Providers:  IncredibleEmployment.be  This test is no t yet approved or cleared by the [place] FDA and  has been authorized for detection and/or diagnosis of  SARS-CoV-2 by FDA under an Emergency Use Authorization (EUA). This EUA will remain  in effect (meaning this test can be used) for the duration of the COVID-19 declaration under Section 564(b)(1) of the Act, 21 U.S.C.section 360bbb-3(b)(1), unless the authorization is terminated  or revoked sooner.       Influenza A by PCR NEGATIVE NEGATIVE Final   Influenza B by PCR NEGATIVE NEGATIVE Final    Comment: (NOTE) The Xpert Xpress SARS-CoV-2/FLU/RSV plus assay  is intended as an aid in the diagnosis of influenza from Nasopharyngeal swab specimens and should not be used as a sole basis for treatment. Nasal washings and aspirates are unacceptable for Xpert Xpress SARS-CoV-2/FLU/RSV testing.  Fact Sheet for Patients: EntrepreneurPulse.com.au  Fact Sheet for Healthcare Providers: IncredibleEmployment.be  This test is not yet approved or cleared by the [place] FDA and has been authorized for detection and/or diagnosis of SARS-CoV-2 by FDA under an Emergency Use Authorization (EUA). This EUA will remain in effect (meaning this test can be used) for the duration of the COVID-19 declaration under Section 564(b)(1) of the Act, 21 U.S.C. section 360bbb-3(b)(1), unless the authorization is terminated or revoked.  Performed at [facility], [place] [address], [place], [place] [address]   Blood culture (routine x 2)     Status: None (Preliminary result)   Collection Time: 08/14/20  6:32 AM   Specimen: BLOOD  Result Value Ref Range Status   Specimen Description BLOOD SITE NOT SPECIFIED  Final   Special Requests   Final    BOTTLES DRAWN AEROBIC AND ANAEROBIC Blood Culture adequate volume   Culture   Final    NO GROWTH < 24 HOURS Performed at [facility], [place] [address] [place], [place] [address]    Report Status PENDING  Incomplete  Blood culture (routine x 2)     Status: None (Preliminary result)   Collection Time: 08/14/20 10:10 AM   Specimen: BLOOD LEFT HAND  Result Value Ref Range Status   Specimen Description BLOOD LEFT HAND  Final   Special Requests   Final    BOTTLES DRAWN AEROBIC AND ANAEROBIC Blood Culture adequate volume   Culture   Final    NO GROWTH < 24 HOURS Performed at [facility], [place] [address] [place],  [address]    Report Status PENDING  Incomplete    Renal Function: Recent Labs    08/14/20 0318 08/14/20 1432 08/15/20 0300  CREATININE 10.73* 8.19*  10.02*   CrCl cannot be calculated (Unknown ideal weight.).  Radiologic Imaging: US Renal  Result Date: 08/14/2020 CLINICAL DATA:  40 year old female with acute renal failure. EXAM: RENAL / URINARY TRACT ULTRASOUND COMPLETE COMPARISON:  CT Abdomen and Pelvis 11/13/2017. FINDINGS: Right Kidney: Renal measurements: 11.1 x 5.0 x 5.7 cm = volume: 166 mL. Echogenic right renal cortex (image 4). Mild to moderate right hydronephrosis (image 9). No right renal mass or definite cyst. Left Kidney: Renal measurements: Left kidney is difficult to visualize and might be obscured by chronic renal cortical insufficiency (that measured on image 12 might not entirely be renal parenchyma (12 cm in length). No left hydronephrosis. No definite left renal mass. Bladder: Diminutive, unremarkable. Other: IMPRESSION: 1. Echogenic right kidney suggesting chronic medical renal disease but with superimposed mild to moderate right hydronephrosis. 2. No left hydronephrosis although the left kidney is poorly delineated by ultrasound. Possible left renal atrophy. 3. Follow-up noncontrast CT Abdomen and Pelvis recommended. Electronically Signed   By: Genevie Ann  M.D.   On: 08/14/2020 07:26   CT RENAL STONE STUDY  Addendum Date: 08/15/2020   ADDENDUM REPORT: 08/15/2020 10:57 ADDENDUM: These results were called by telephone at the time of interpretation on 08/15/2020 at 10:43 a.m. to provider [facility] , who verbally acknowledged these results. Electronically Signed   By: Marin Olp M.D.   On: 08/15/2020 10:57   Result Date: 08/15/2020 CLINICAL DATA:  Acute renal failure. Heroin abuse. Nausea and vomiting with acute on chronic anemia. EXAM: CT ABDOMEN AND PELVIS WITHOUT CONTRAST TECHNIQUE: Multidetector CT imaging of the abdomen and pelvis was performed following the standard protocol without IV contrast. COMPARISON:  11/13/2017 FINDINGS: Lower chest: Lung bases are clear. There is evidence of pneumomediastinum with air seen  adjacent the anterior heart and distal esophagus. Mild cervical true wall thickening of the distal esophagus. Hepatobiliary: Previous cholecystectomy. Liver and biliary tree are within normal. Pancreas: Subtle low-attenuation in the region of the pancreatic head without significant change. No adjacent inflammatory change or free fluid. Spleen: Normal. Adrenals/Urinary Tract: Adrenal glands are normal. Kidneys are normal in size with bilateral mild hydronephrosis right greater than left. No nephrolithiasis. There is subcapsular low-attenuation over the upper pole right kidney. Subtle patchy appearance to the renal cortex bilaterally. These findings may be due to infection. There is minimal fluid over the left perinephric region. No evidence of ureteral stones. Bladder is normal. Stomach/Bowel: Stomach and small bowel are normal. Appendix is normal. Colon is normal. Vascular/Lymphatic: Abdominal aorta is normal in caliber. There is minimal calcified plaque over the abdominal aorta. No evidence of adenopathy. Reproductive: Normal. Other: No evidence of free fluid or focal inflammatory change. Musculoskeletal: Mild sclerosis along the sacroiliac joints. IMPRESSION: 1. Evidence of pneumomediastinum. Mild wall thickening of the distal esophagus. Recommend clinical correlation as findings may be due to patient's recent vomiting episodes. 2. Mild bilateral hydronephrosis right greater than left. No nephrolithiasis or ureteral stones. Subtle patchy appearance to the renal cortex bilaterally with subcapsular low-attenuation over the upper pole right kidney. Findings may be due to infection. Recommend clinical correlation and follow-up imaging as clinically indicated. 3. Subtle indeterminate low-attenuation in the region of the pancreatic head without significant change. Consider MRI for further evaluation to exclude pancreatic mass. 4. Aortic atherosclerosis. Aortic Atherosclerosis (ICD10-I70.0). Electronically Signed: By:  Marin Olp M.D. On: 08/15/2020 10:35    I independently reviewed the above imaging studies.  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  History of cervical cancer treated with chemo and radiation 10 years ago.  -The risks, benefits and alternatives of cystoscopy with BILATERAL JJ stent placement was discussed with the patient.  Risks include, but are not limited to: bleeding, urinary tract infection, ureteral injury, ureteral stricture disease, chronic pain, urinary symptoms, bladder injury, stent migration, the need for nephrostomy tube placement, MI, CVA, DVT, PE and the inherent risks with general anesthesia.  The patient voices understanding and wishes to proceed.   Ellison Hughs, MD [facility] 08/15/2020, 11:55 AM

## 2020-08-15 NOTE — Progress Notes (Signed)
[facility] Progress Note  Subjective: seen in room, +n/v, no other c/o'. No CP , lethargy or confusion.   Vitals:   08/15/20 0627 08/15/20 0931 08/15/20 1315 08/15/20 1330  BP: (!) 152/81 (!) 141/82 (!) 138/95 (!) 148/77  Pulse: 78 85  99  Resp: 14 17 18 12   Temp: 98.4 F (36.9 C) 99 F (37.2 C) 97.7 F (36.5 C)   TempSrc: Oral Oral    SpO2: 100% 99% 100% 100%    Exam: Gen alert, chron ill slightly on appearance Throat dry  No jvd or bruits, flat neck veins Chest clear bilat to bases RRR no MRG Abd soft ntnd no mass or ascites +bs Ext brawny skin changes bilat LE's, no sig edema Neuro is alert, Ox 3 , nf, no asterixis    Home meds:  - none  April 2019 Abd CT renal stone study >> Adrenals/Urinary Tract: Adrenal glands are unremarkable. Kidneys are normal, without renal calculi, focal lesion, or hydronephrosis. Bladder is under distended. 08/14/20 - RENAL US > Right -- 11cm, echogenic cortex, mild to moderate R hydronephrosis, no mass or cyst. Left -- difficult to visualize, might be obscured by chronic renal cortical insufficiency, 12 cm in length, no hydronephrosis. No definite left renal mass. Bladder -- diminutive, unremarkable.    UA 08/14/20 - turbid, amber, mod Hb, larg LE, 100 prot, many bact, 11-20rbc, >50 wbc, 0-5 epis   UA 05/08/20 - normal    BP's normal to high since presentation last night 140/90 average    100% on RA  tmasx 98 deg here, WBC 16K    Blood cx's  1/2 neg at 12 hrs     COVID neg      Last creat 1.09 in Oct 2021     HIV negative  Assessment/ Plan: 1. AKI - severe acute renal failure in pt w/ hx of heroin abuse (off x 4 wks) here w/ N/V, severe acute on chronic anemia, wt loss and malaise. UA shows some rbc's and wbc's, moderate protein. UPC ratio is 9, sent off complements/ serologies. Renal US suggested obstruction on the R side, and L kidney was difficult to visualize. Seen by urology and CT abd showed bilat hydro. Per urology  pt has bilat ureteral stricture disease from prior abdominal radiation for her cervical cancer; went to OR today 1/16 and had bilat ureteral stents placed. Should improve. Vol stable. Will follow. Cont IVF"s.  2. Acute / chronic anemia - Hb was 7- 10 range here recently in Oct 2021. 4.6 here on admit, up to 7.1 today. Follow.  3. ^WBC - pmd, started on Rocephin for suspected UTI.  4. UTI - as above     Rob Clorene Nerio 08/15/2020, 1:43 PM   Recent Labs  Lab 08/14/20 0318 08/14/20 0632 08/15/20 0300 08/15/20 1241  K 3.1*  --  3.0* 4.1  BUN 114*  --  106* 124*  CREATININE 10.73*   < > 10.02* 9.00*  CALCIUM 7.3*  --  7.1*  --   HGB 6.4*   < > 5.3* 7.1*   < > = values in this interval not displayed.   Inpatient medications: . [MAR Hold] sodium chloride   Intravenous Once  . [MAR Hold] sodium chloride   Intravenous Once  . sodium chloride   Intravenous Once  . sodium chloride   Intravenous Once  . fentaNYL      . [MAR Hold] folic acid  1 mg Intravenous Daily  . [MAR Hold] sodium  chloride flush  3 mL Intravenous Q12H   . sodium chloride 50 mL/hr at 08/15/20 0628  . magnesium sulfate bolus IVPB     [MAR Hold] diphenhydrAMINE, fentaNYL (SUBLIMAZE) injection, [MAR Hold] iohexol, [MAR Hold] ondansetron (ZOFRAN) IV, ondansetron (ZOFRAN) IV, oxyCODONE **OR** oxyCODONE

## 2020-08-15 NOTE — Consult Note (Addendum)
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  She was diagnosed with acute renal failure and renal ultrasound revealed right-sided hydronephrosis with poorly visualized left kidney.  Subsequent noncontrast CT scan of the abdomen and pelvis revealed bilateral hydronephrosis without evidence of kidney stones.  Incidental finding on the CT scan was notable for pneumomediastinum seen in the inferior aspect of the chest.  Follow-up CT scan of the chest and abdomen was requested using water-soluble oral contrast and cardiothoracic surgical consultation requested.  Immediately following completion of the follow-up CT scan the patient was taken directly to the operating room by Dr. Lovena Neighbours for possible ureteral stent placement.  At present the patient is intubated under general anesthesia in the operating room and direct interview for review of systems and physical examination is not currently possible.  Review of admission history and physical exam and previous consultation notes from the nephrology and urology teams are reviewed.  There is no reported history of any problems with swallowing or any type of pain in the neck, chest or abdomen.  There is no history of fever nor other signs of infection.  Past Medical History:  Diagnosis Date  . History of cervical cancer   . Migraine headache   . Polysubstance abuse ([place]) 05/05/2020    Past Surgical History:  Procedure Laterality Date  . IRRIGATION AND DEBRIDEMENT ABSCESS Bilateral  05/05/2020   Procedure: left forearm incision and drainage and aspiration of the right wrist joint;  Surgeon: Cindra Presume, MD;  Location: [facility];  Service: Plastics;  Laterality: Bilateral;  . MANDIBLE FRACTURE SURGERY      Family History  Problem Relation Age of Onset  . Autism spectrum disorder Other     Social History   Socioeconomic History  . Marital status: Divorced    Spouse name: Not on file  . Number of children: Not on file  . Years of education: Not on file  . Highest education level: Not on file  Occupational History  . Not on file  Tobacco Use  . Smoking status: Current Every Day Smoker    Packs/day: 0.25    Types: Cigarettes  . Smokeless tobacco: Never Used  Vaping Use  . Vaping Use: Never used  Substance and Sexual Activity  . Alcohol use: No  . Drug use: Not Currently    Types: Heroin    Comment: Stopped 2 months ago  . Sexual activity: Not on file  Other Topics Concern  . Not on file  Social History Narrative  . Not on file   Social Determinants of Health   Financial Resource Strain: Not on file  Food Insecurity: Not on file  Transportation Needs: Not on file  Physical Activity: Not on file  Stress: Not on file  Social Connections: Not on file  Intimate Partner Violence: Not on file    Prior to Admission medications   Not on File  Current Facility-Administered Medications  Medication Dose Route Frequency Provider Last Rate Last Admin  . [MAR Hold] 0.9 %  sodium chloride infusion (Manually program via Guardrails IV Fluids)   Intravenous Once Cristal Deer, MD      . Doug Sou Hold] 0.9 %  sodium chloride infusion (Manually program via Guardrails IV Fluids)   Intravenous Once Judd Gaudier V, MD      . 0.9 %  sodium chloride infusion   Intravenous Continuous Cristal Deer, MD 50 mL/hr at 08/15/20 0628 New Bag at 08/15/20 9381  . [MAR Hold] cefTRIAXone (ROCEPHIN) 2 g in sodium chloride 0.9 % 100 mL IVPB  2 g Intravenous Q24H Cristal Deer, MD 200 mL/hr at 08/15/20 0944 2 g at 08/15/20 0944  . chlorhexidine (PERIDEX) 0.12 % solution           . [MAR Hold] diphenhydrAMINE (BENADRYL) injection 12.5 mg  12.5 mg Intravenous Q6H PRN Cristal Deer, MD      . Doug Sou Hold] folic acid injection 1 mg  1 mg Intravenous Daily Hammons, Theone Murdoch, RPH      . [MAR Hold] iohexol (OMNIPAQUE) 350 MG/ML injection 50 mL  50 mL Oral Once PRN Regalado, Belkys A, MD      . Doug Sou Hold] ondansetron (ZOFRAN) injection 4 mg  4 mg Intravenous Q6H PRN Regalado, Belkys A, MD      . Doug Sou Hold] sodium chloride flush (NS) 0.9 % injection 3 mL  3 mL Intravenous Q12H Cristal Deer, MD   3 mL at 08/14/20 1723    Allergies  Allergen Reactions  . Amoxicillin-Pot Clavulanate Anaphylaxis  . Imitrex [Sumatriptan Base] Other (See Comments)    Heart races  . Cephalexin Rash      Review of Systems:  Per HPI    Physical Exam:   BP (!) 141/82 (BP Location: Left Wrist)   Pulse 85   Temp 99 F (37.2 C) (Oral)   Resp 17   SpO2 99%   Pending   Diagnostic Tests:  Lab Results: Recent Labs    08/14/20 0632 08/15/20 0300  WBC 16.7* 13.9*  HGB 4.6* 5.3*  HCT 15.0* 17.2*  PLT 343 274   BMET:  Recent Labs    08/14/20 0318 08/14/20 1432 08/15/20 0300  NA 131*  --  132*  K 3.1*  --  3.0*  CL 87*  --  92*  CO2 24  --  22  GLUCOSE 102*  --  96  BUN 114*  --  106*  CREATININE 10.73* 8.19* 10.02*  CALCIUM 7.3*  --  7.1*    CBG (last 3)  No results for input(s): GLUCAP in the last 72 hours. PT/INR:  No results for input(s): LABPROT, INR in the last 72 hours.  CXR:  N/A   CT ABDOMEN AND PELVIS WITHOUT CONTRAST  TECHNIQUE: Multidetector CT imaging of the abdomen and pelvis was performed following the standard protocol without IV contrast.  COMPARISON:  11/13/2017  FINDINGS: Lower chest: Lung bases are clear. There is evidence of pneumomediastinum with air seen adjacent the anterior heart and distal esophagus. Mild  cervical true wall thickening of the distal esophagus.  Hepatobiliary: Previous cholecystectomy. Liver and biliary tree are within normal.  Pancreas: Subtle low-attenuation in the region of the pancreatic head without significant change. No adjacent inflammatory change or free fluid.  Spleen: Normal.  Adrenals/Urinary Tract: Adrenal glands are normal. Kidneys are normal in size with bilateral mild hydronephrosis right greater than left. No nephrolithiasis. There is subcapsular  low-attenuation over the upper pole right kidney. Subtle patchy appearance to the renal cortex bilaterally. These findings may be due to infection. There is minimal fluid over the left perinephric region. No evidence of ureteral stones. Bladder is normal.  Stomach/Bowel: Stomach and small bowel are normal. Appendix is normal. Colon is normal.  Vascular/Lymphatic: Abdominal aorta is normal in caliber. There is minimal calcified plaque over the abdominal aorta. No evidence of adenopathy.  Reproductive: Normal.  Other: No evidence of free fluid or focal inflammatory change.  Musculoskeletal: Mild sclerosis along the sacroiliac joints.  IMPRESSION: 1. Evidence of pneumomediastinum. Mild wall thickening of the distal esophagus. Recommend clinical correlation as findings may be due to patient's recent vomiting episodes. 2. Mild bilateral hydronephrosis right greater than left. No nephrolithiasis or ureteral stones. Subtle patchy appearance to the renal cortex bilaterally with subcapsular low-attenuation over the upper pole right kidney. Findings may be due to infection. Recommend clinical correlation and follow-up imaging as clinically indicated. 3. Subtle indeterminate low-attenuation in the region of the pancreatic head without significant change. Consider MRI for further evaluation to exclude pancreatic mass. 4. Aortic atherosclerosis.  Aortic Atherosclerosis  (ICD10-I70.0).  Electronically Signed: By: Marin Olp M.D. On: 08/15/2020 10:35    CT CHEST AND ABDOMEN WITHOUT CONTRAST  TECHNIQUE: Multidetector CT imaging of the chest and abdomen was performed following the standard protocol without intravenous contrast. Patient was given 50 mL Omnipaque 350 dilute and 12 ounces of water orally immediately before scanning.  COMPARISON:  Noncontrast abdominopelvic CT earlier today as well as previous abdominal CT 11/13/2017  FINDINGS: CT CHEST FINDINGS WITHOUT CONTRAST  Cardiovascular: Mild cardiomegaly. Thoracic aorta is otherwise unremarkable. Remaining vascular structures are unremarkable.  Mediastinum/Nodes: Evidence of moderate pneumomediastinum throughout the chest. As this is visualized on the most superior image at the neck base extends through the chest to the diaphragm. Minimal cervical wall thickening throughout the esophagus. Contrast is present throughout the course of the esophagus without evidence of extravasation of contrast. No definite focal esophageal injury is identified. No mediastinal or hilar adenopathy. Remaining mediastinal structures are unremarkable.  Lungs/Pleura: Lungs are well inflated without focal airspace consolidation or effusion. No pneumothorax. Small thin-walled cysts over the medial right apex. Focal debris over the dependent portion of the upper trachea. Airways are normal.  Musculoskeletal: No acute findings.  CT ABDOMEN FINDINGS WITHOUT CONTRAST  Hepatobiliary: Previous cholecystectomy. Liver and biliary tree are unremarkable.  Pancreas: No change in recently seen somewhat heterogeneous low-attenuation over the pancreatic head stable compared to the previous exam from 2019. No perinephric inflammation or fluid.  Spleen: Normal.  Adrenals/Urinary Tract: Adrenal glands are normal. Kidneys are normal in size with mild bilateral hydronephrosis unchanged. Subcapsular  low-attenuation over the upper pole right kidney unchanged. Minimal left perinephric fluid unchanged. No renal stones. Visualized ureters are unremarkable.  Stomach/Bowel: Contrast within the stomach which is otherwise unremarkable. Visualized bowel is unremarkable.  Vascular/Lymphatic: Abdominal aorta is normal caliber. Minimal calcified plaque over the proximal abdominal aorta. No adenopathy.  Other: No other changes.  Musculoskeletal: No acute findings.  IMPRESSION: 1. Moderate amount of pneumomediastinum throughout the chest. No definite focal esophageal injury identified. No definite etiology visualized for this pneumomediastinum. 2. Subcapsular low-density collection over the upper pole right kidney with bilateral hydronephrosis as these findings are stable. Findings could be seen due to infection. Recommend clinical correlation. 3. Moderate focal dependent debris over the upper trachea which may be due to aspiration versus posttraumatic. 4. Mild heterogeneous low-attenuation over the pancreatic head  stable compared to the previous exam from 2019. Pancreatic mass is possible. Continue to recommend further evaluation on elective basis with MRI. 5. Aortic atherosclerosis.  Aortic Atherosclerosis (ICD10-I70.0).   Electronically Signed   By: Marin Olp M.D.   On: 08/15/2020 12:17    Impression:  I have personally reviewed the patient's CT scans of the chest and abdomen.  There is pneumomediastinum with a small to moderate amount of air surrounding the esophagus throughout its entire length from the inferior portion of the neck to the GE junction.  There is no sign of any fluid surrounding the esophagus at any level and there is no sign of extravasation of contrast.  There is no sign of significant air or fluid in the esophagus below the diaphragmatic hiatus.  There is nothing to suggest the presence of esophageal perforation.  Findings are consistent with a  diagnosis of spontaneous pneumomediastinum without sign of injury to the esophagus in the setting of severe nausea and vomiting.    Plan:  I would recommend keeping the patient strictly NPO and treating with empiric broad-spectrum antibiotics (IV Zosyn) for at least 48 to 72 hours.  Keep head of bed elevated is much as possible.  We will follow-up once the patient has recovered from her ongoing surgical procedure and continue to follow with you.   I spent in excess of 45 minutes during the conduct of this hospital consultation .    Valentina Gu. Roxy Manns, MD 08/15/2020 12:24 PM

## 2020-08-15 NOTE — Progress Notes (Signed)
Pharmacy Antibiotic Note  Marie Brewer is a 40 y.o. female admitted on 08/14/2020 with anemia and AKI.  Pt was found to have bilateral hydronephrosis and bilateral stents placed 1/16.  Pt also found to have pneumomediastinum.  Pt was seen by CVTS who recommended Zosyn.  Pt reported anaphylaxis allergy to Augmentin but tolerated recent Rocephin administration.  I was unable to clarify allergy with patient at this time due to procedure.  Spoke with Dr. Tyrell Antonio.  Will start Meropenem given low risk of cross reactivity with PCN allergy and similar antimicrobial spectrum.    Note, AKI with SCr 9.  Plan: Meropenem 1gm IV q24h Follow-up renal function      Temp (24hrs), Avg:98.2 F (36.8 C), Min:97.4 F (36.3 C), Max:99 F (37.2 C)  Recent Labs  Lab 08/14/20 0318 08/14/20 0632 08/14/20 0635 08/14/20 1021 08/14/20 1432 08/15/20 0300 08/15/20 1241  WBC 15.7* 16.7*  --   --   --  13.9*  --   CREATININE 10.73*  --   --   --  8.19* 10.02* 9.00*  LATICACIDVEN  --   --  1.4 1.1  --   --   --     CrCl cannot be calculated (Unknown ideal weight.).    Allergies  Allergen Reactions  . Amoxicillin-Pot Clavulanate Anaphylaxis  . Imitrex [Sumatriptan Base] Other (See Comments)    Heart races  . Cephalexin Rash    Antimicrobials this admission: Rocephin 1/15 >> 1/16 Meropenem 1/16 >>  Dose adjustments this admission:   Microbiology results: 1/15: BCx x 2 >> 1/15: UCx >> Group B Strep  Thank you for allowing pharmacy to be a part of this patient's care.  [employer], Pharm.D., BCPS Clinical Pharmacist  **Pharmacist phone directory can be found on [email or web address] listed under [place].  08/15/2020 3:20 PM

## 2020-08-15 NOTE — Anesthesia Procedure Notes (Signed)
Procedure Name: Intubation Date/Time: 08/15/2020 12:32 PM Performed by: Clearnce Sorrel, CRNA Pre-anesthesia Checklist: Patient identified, Emergency Drugs available, Suction available, Patient being monitored and Timeout performed Patient Re-evaluated:Patient Re-evaluated prior to induction Oxygen Delivery Method: Circle system utilized Preoxygenation: Pre-oxygenation with 100% oxygen Induction Type: IV induction, Rapid sequence and Cricoid Pressure applied Laryngoscope Size: Mac and 3 Grade View: Grade I Tube type: Oral Tube size: 7.0 mm Number of attempts: 1 Airway Equipment and Method: Stylet Placement Confirmation: ETT inserted through vocal cords under direct vision,  positive ETCO2 and breath sounds checked- equal and bilateral Secured at: 22 cm Tube secured with: Tape Dental Injury: Teeth and Oropharynx as per pre-operative assessment

## 2020-08-15 NOTE — Consult Note (Signed)
WOC Nurse Consult Note: Reason for Consult:LLE full thickness chronic, nonhealing wound with smaller, partial thickness wound that is nearly reepithelialized (trauma, hit LE at home last pm in bathroom) Wound type:Chronic, nonhealing full thickness Pressure Injury POA: Yes/No/NA Measurement: 5cm x 4cm x 0.3cm per Nursing FLow Sheet Wound bed:See photo.  100% red, moist Drainage (amount, consistency, odor) small serous Periwound:intact, erythematous Dressing procedure/placement/frequency: I have provided Nursing with conservative topical care orders for wound using an antimicrobial nonadherent (xeroform) and securing with a silicone foam absorbant dressing.  Daily changes are indicated.  [place] nursing team will not follow, but will remain available to this patient, the nursing and medical teams.  Please re-consult if needed. Thanks, Maudie Flakes, MSN, RN, [place], Arther Abbott  Pager# [phone]

## 2020-08-15 NOTE — Transfer of Care (Signed)
Immediate Anesthesia Transfer of Care Note  Patient: Marie Brewer  Procedure(s) Performed: CYSTOSCOPY WITH RETROGRADE PYELOGRAM/URETERAL STENT PLACEMENT (Bilateral Ureter)  Patient Location: PACU  Anesthesia Type:General  Level of Consciousness: awake, alert  and oriented  Airway & Oxygen Therapy: Patient Spontanous Breathing  Post-op Assessment: Report given to RN and Post -op Vital signs reviewed and stable  Post vital signs: Reviewed and stable  Last Vitals:  Vitals Value Taken Time  BP 138/95 08/15/20 1318  Temp    Pulse 118 08/15/20 1320  Resp 16 08/15/20 1320  SpO2 100 % 08/15/20 1320  Vitals shown include unvalidated device data.  Last Pain:  Vitals:   08/15/20 1228  TempSrc:   PainSc: 5       Patients Stated Pain Goal: 3 (40/33/53 3174)  Complications: No complications documented.

## 2020-08-16 ENCOUNTER — Encounter (HOSPITAL_COMMUNITY): Payer: Self-pay | Admitting: Urology

## 2020-08-16 ENCOUNTER — Inpatient Hospital Stay (HOSPITAL_COMMUNITY): Payer: Self-pay

## 2020-08-16 LAB — CBC WITH DIFFERENTIAL/PLATELET
Abs Immature Granulocytes: 0.15 10*3/uL — ABNORMAL HIGH (ref 0.00–0.07)
Basophils Absolute: 0 10*3/uL (ref 0.0–0.1)
Basophils Relative: 0 %
Eosinophils Absolute: 0 10*3/uL (ref 0.0–0.5)
Eosinophils Relative: 0 %
HCT: 22.9 % — ABNORMAL LOW (ref 36.0–46.0)
Hemoglobin: 7.4 g/dL — ABNORMAL LOW (ref 12.0–15.0)
Immature Granulocytes: 2 %
Lymphocytes Relative: 6 %
Lymphs Abs: 0.5 10*3/uL — ABNORMAL LOW (ref 0.7–4.0)
MCH: 28.9 pg (ref 26.0–34.0)
MCHC: 32.3 g/dL (ref 30.0–36.0)
MCV: 89.5 fL (ref 80.0–100.0)
Monocytes Absolute: 0.1 10*3/uL (ref 0.1–1.0)
Monocytes Relative: 2 %
Neutro Abs: 7.1 10*3/uL (ref 1.7–7.7)
Neutrophils Relative %: 90 %
Platelets: 277 10*3/uL (ref 150–400)
RBC: 2.56 MIL/uL — ABNORMAL LOW (ref 3.87–5.11)
RDW: 14.6 % (ref 11.5–15.5)
WBC: 7.9 10*3/uL (ref 4.0–10.5)
nRBC: 0 % (ref 0.0–0.2)

## 2020-08-16 LAB — TYPE AND SCREEN
ABO/RH(D): O NEG
Antibody Screen: NEGATIVE
Unit division: 0
Unit division: 0
Unit division: 0
Weak D: POSITIVE

## 2020-08-16 LAB — BPAM RBC
Blood Product Expiration Date: 202201222359
Blood Product Expiration Date: 202201262359
Blood Product Expiration Date: 202201262359
ISSUE DATE / TIME: 202201160547
ISSUE DATE / TIME: 202201161227
ISSUE DATE / TIME: 202201161637
Unit Type and Rh: 9500
Unit Type and Rh: 9500
Unit Type and Rh: 9500

## 2020-08-16 LAB — RENAL FUNCTION PANEL
Albumin: 2.2 g/dL — ABNORMAL LOW (ref 3.5–5.0)
Anion gap: 16 — ABNORMAL HIGH (ref 5–15)
BUN: 97 mg/dL — ABNORMAL HIGH (ref 6–20)
CO2: 19 mmol/L — ABNORMAL LOW (ref 22–32)
Calcium: 8.2 mg/dL — ABNORMAL LOW (ref 8.9–10.3)
Chloride: 97 mmol/L — ABNORMAL LOW (ref 98–111)
Creatinine, Ser: 8.64 mg/dL — ABNORMAL HIGH (ref 0.44–1.00)
GFR, Estimated: 5 mL/min — ABNORMAL LOW (ref >60)
Glucose, Bld: 173 mg/dL — ABNORMAL HIGH (ref 70–99)
Phosphorus: 5.9 mg/dL — ABNORMAL HIGH (ref 2.5–4.6)
Potassium: 2.9 mmol/L — ABNORMAL LOW (ref 3.5–5.1)
Sodium: 132 mmol/L — ABNORMAL LOW (ref 135–145)

## 2020-08-16 LAB — SAVE SMEAR(SSMR), FOR PROVIDER SLIDE REVIEW

## 2020-08-16 LAB — MAGNESIUM: Magnesium: 1.7 mg/dL (ref 1.7–2.4)

## 2020-08-16 LAB — PATHOLOGIST SMEAR REVIEW

## 2020-08-16 MED ORDER — POTASSIUM CHLORIDE 10 MEQ/100ML IV SOLN: 10.0000 meq | INTRAVENOUS | Status: AC

## 2020-08-16 MED ORDER — MAGNESIUM SULFATE 2 GM/50ML IV SOLN
2.0000 g | Freq: Once | INTRAVENOUS | Status: AC
  Administered 2020-08-16: 2 g via INTRAVENOUS
  Filled 2020-08-16: qty 50

## 2020-08-16 NOTE — Progress Notes (Signed)
Patient refused IV team to insert IV; explained to patient why she needs it. Patient become agitated and starts yelling at staff. She adamantly states that she wants to leave AMA. Dr. Tyrell Antonio made aware.

## 2020-08-16 NOTE — Progress Notes (Signed)
[facility] Progress Note   08/14/20 - RENAL US >Right -- 11cm, echogenic cortex, mild to moderateRhydronephrosis, no mass or cyst. Left -- difficult to visualize, mightbe obscured by chronic renal cortical insufficiency,12 cm in length, no hydronephrosis. No definite left renal mass.Bladder -- diminutive, unremarkable.  UA 08/14/20 - turbid, amber, mod Hb, larg LE, 100 prot, many bact, 11-20rbc, >50 wbc, 0-5 epis  Assessment/ Plan:   1. AKI - severe acute renal failure in pt w/ hx of heroin abuse (off x 4 wks) here w/ N/V, severe acute on chronic anemia, wt loss and malaise. UA shows some rbc's and wbc's, moderate protein. UPC ratio is 9, sent off complements/ serologies. Renal US suggested obstruction on the R side, and L kidney was difficult to visualize. Seen by urology and CT abd showed bilat hydro. Per urology pt has bilat ureteral stricture disease from prior abdominal radiation for her cervical cancer; went to OR 1/16 and had bilat ureteral stents placed. Should improve. Vol stable.  Cont IVF"s when NPO.  - Slowly improving; strict I&O's. 2 bags from foley emptied last night. Will continue to follow closely. 2. Acute / chronic anemia - Hb was 7- 10 range here recently in Oct 2021. 4.6 here on admit, up to 7.1 today. Follow.  3. ^WBC - pmd, started on Rocephin for suspected UTI.  4. UTI - as above   Subjective:   Some diarrhea but she thinks it's bec she ate last night after not eating much for weeks. Denies n/v/ dyspnea/ cough/ f/c.   Objective:   BP (!) 149/87 (BP Location: Left Arm)   Pulse 75   Temp 98.7 F (37.1 C) (Oral)   Resp 16   SpO2 100%   Intake/Output Summary (Last 24 hours) at 08/16/2020 0834 Last data filed at 08/16/2020 0600 Gross per 24 hour  Intake 1152 ml  Output 801 ml  Net 351 ml   Weight change:   Physical Exam: Genalert, chron ill slightly on appearance Throat dry No jvd or bruits, flat neck veins Chest clear bilatto  bases RRR no MRG Abd soft ntnd no mass or ascites +bs Extbrawny skin changes bilat LE's, no sig edema Neuro is alert, Ox 3 , nf, no asterixis  Imaging: CT ABDOMEN WO CONTRAST  Result Date: 08/15/2020 CLINICAL DATA:  Possible esophageal perforation. Pneumomediastinum on recent abdominal CT. Nausea and vomiting. Acute on chronic anemia. History of heroin abuse. EXAM: CT CHEST AND ABDOMEN WITHOUT CONTRAST TECHNIQUE: Multidetector CT imaging of the chest and abdomen was performed following the standard protocol without intravenous contrast. Patient was given 50 mL Omnipaque 350 dilute and 12 ounces of water orally immediately before scanning. COMPARISON:  Noncontrast abdominopelvic CT earlier today as well as previous abdominal CT 11/13/2017 FINDINGS: CT CHEST FINDINGS WITHOUT CONTRAST Cardiovascular: Mild cardiomegaly. Thoracic aorta is otherwise unremarkable. Remaining vascular structures are unremarkable. Mediastinum/Nodes: Evidence of moderate pneumomediastinum throughout the chest. As this is visualized on the most superior image at the neck base extends through the chest to the diaphragm. Minimal cervical wall thickening throughout the esophagus. Contrast is present throughout the course of the esophagus without evidence of extravasation of contrast. No definite focal esophageal injury is identified. No mediastinal or hilar adenopathy. Remaining mediastinal structures are unremarkable. Lungs/Pleura: Lungs are well inflated without focal airspace consolidation or effusion. No pneumothorax. Small thin-walled cysts over the medial right apex. Focal debris over the dependent portion of the upper trachea. Airways are normal. Musculoskeletal: No acute findings. CT ABDOMEN FINDINGS WITHOUT  CONTRAST Hepatobiliary: Previous cholecystectomy. Liver and biliary tree are unremarkable. Pancreas: No change in recently seen somewhat heterogeneous low-attenuation over the pancreatic head stable compared to the previous  exam from 2019. No perinephric inflammation or fluid. Spleen: Normal. Adrenals/Urinary Tract: Adrenal glands are normal. Kidneys are normal in size with mild bilateral hydronephrosis unchanged. Subcapsular low-attenuation over the upper pole right kidney unchanged. Minimal left perinephric fluid unchanged. No renal stones. Visualized ureters are unremarkable. Stomach/Bowel: Contrast within the stomach which is otherwise unremarkable. Visualized bowel is unremarkable. Vascular/Lymphatic: Abdominal aorta is normal caliber. Minimal calcified plaque over the proximal abdominal aorta. No adenopathy. Other: No other changes. Musculoskeletal: No acute findings. IMPRESSION: 1. Moderate amount of pneumomediastinum throughout the chest. No definite focal esophageal injury identified. No definite etiology visualized for this pneumomediastinum. 2. Subcapsular low-density collection over the upper pole right kidney with bilateral hydronephrosis as these findings are stable. Findings could be seen due to infection. Recommend clinical correlation. 3. Moderate focal dependent debris over the upper trachea which may be due to aspiration versus posttraumatic. 4. Mild heterogeneous low-attenuation over the pancreatic head stable compared to the previous exam from 2019. Pancreatic mass is possible. Continue to recommend further evaluation on elective basis with MRI. 5. Aortic atherosclerosis. Aortic Atherosclerosis (ICD10-I70.0). Electronically Signed   By: Marin Olp M.D.   On: 08/15/2020 12:17   CT CHEST WO CONTRAST  Result Date: 08/15/2020 CLINICAL DATA:  Possible esophageal perforation. Pneumomediastinum on recent abdominal CT. Nausea and vomiting. Acute on chronic anemia. History of heroin abuse. EXAM: CT CHEST AND ABDOMEN WITHOUT CONTRAST TECHNIQUE: Multidetector CT imaging of the chest and abdomen was performed following the standard protocol without intravenous contrast. Patient was given 50 mL Omnipaque 350 dilute and  12 ounces of water orally immediately before scanning. COMPARISON:  Noncontrast abdominopelvic CT earlier today as well as previous abdominal CT 11/13/2017 FINDINGS: CT CHEST FINDINGS WITHOUT CONTRAST Cardiovascular: Mild cardiomegaly. Thoracic aorta is otherwise unremarkable. Remaining vascular structures are unremarkable. Mediastinum/Nodes: Evidence of moderate pneumomediastinum throughout the chest. As this is visualized on the most superior image at the neck base extends through the chest to the diaphragm. Minimal cervical wall thickening throughout the esophagus. Contrast is present throughout the course of the esophagus without evidence of extravasation of contrast. No definite focal esophageal injury is identified. No mediastinal or hilar adenopathy. Remaining mediastinal structures are unremarkable. Lungs/Pleura: Lungs are well inflated without focal airspace consolidation or effusion. No pneumothorax. Small thin-walled cysts over the medial right apex. Focal debris over the dependent portion of the upper trachea. Airways are normal. Musculoskeletal: No acute findings. CT ABDOMEN FINDINGS WITHOUT CONTRAST Hepatobiliary: Previous cholecystectomy. Liver and biliary tree are unremarkable. Pancreas: No change in recently seen somewhat heterogeneous low-attenuation over the pancreatic head stable compared to the previous exam from 2019. No perinephric inflammation or fluid. Spleen: Normal. Adrenals/Urinary Tract: Adrenal glands are normal. Kidneys are normal in size with mild bilateral hydronephrosis unchanged. Subcapsular low-attenuation over the upper pole right kidney unchanged. Minimal left perinephric fluid unchanged. No renal stones. Visualized ureters are unremarkable. Stomach/Bowel: Contrast within the stomach which is otherwise unremarkable. Visualized bowel is unremarkable. Vascular/Lymphatic: Abdominal aorta is normal caliber. Minimal calcified plaque over the proximal abdominal aorta. No adenopathy.  Other: No other changes. Musculoskeletal: No acute findings. IMPRESSION: 1. Moderate amount of pneumomediastinum throughout the chest. No definite focal esophageal injury identified. No definite etiology visualized for this pneumomediastinum. 2. Subcapsular low-density collection over the upper pole right kidney with bilateral hydronephrosis as these findings are stable.  Findings could be seen due to infection. Recommend clinical correlation. 3. Moderate focal dependent debris over the upper trachea which may be due to aspiration versus posttraumatic. 4. Mild heterogeneous low-attenuation over the pancreatic head stable compared to the previous exam from 2019. Pancreatic mass is possible. Continue to recommend further evaluation on elective basis with MRI. 5. Aortic atherosclerosis. Aortic Atherosclerosis (ICD10-I70.0). Electronically Signed   By: Marin Olp M.D.   On: 08/15/2020 12:17   DG Retrograde Pyelogram  Result Date: 08/15/2020 CLINICAL DATA:  40 year old with bilateral hydronephrosis. EXAM: RETROGRADE PYELOGRAM COMPARISON:  CT abdomen 08/15/2018 FLUOROSCOPY TIME:  56.9 seconds FINDINGS: Right retrograde exam demonstrates opacification of the right ureter with moderate dilatation of the right renal pelvis and right renal calices. Evidence for contrast in the duodenum or small bowel loops in the right upper abdomen. Right double-J ureter stent was placed. Opacification of the left ureter. Incomplete evaluation of the proximal left ureter. Moderate dilatation of left renal calices. Placement of left ureter stent. IMPRESSION: Bilateral hydronephrosis and placement of bilateral ureter stents. Electronically Signed   By: Markus Daft M.D.   On: 08/15/2020 13:53   DG Bone Survey Met  Result Date: 08/16/2020 CLINICAL DATA:  Anemia.  History of cervical cancer. EXAM: METASTATIC BONE SURVEY COMPARISON:  None. FINDINGS: No lytic or sclerotic lesion is seen involving the visualized skeleton, including the  spine, pelvis, rib cage or extremities. Mild degenerative disc disease is noted at C5-6 and C6-7. IMPRESSION: No significant abnormality seen involving the visualized skeleton. Electronically Signed   By: Marijo Conception M.D.   On: 08/16/2020 08:11   CT RENAL STONE STUDY  Addendum Date: 08/15/2020   ADDENDUM REPORT: 08/15/2020 10:57 ADDENDUM: These results were called by telephone at the time of interpretation on 08/15/2020 at 10:43 a.m. to provider [facility] , who verbally acknowledged these results. Electronically Signed   By: Marin Olp M.D.   On: 08/15/2020 10:57   Result Date: 08/15/2020 CLINICAL DATA:  Acute renal failure. Heroin abuse. Nausea and vomiting with acute on chronic anemia. EXAM: CT ABDOMEN AND PELVIS WITHOUT CONTRAST TECHNIQUE: Multidetector CT imaging of the abdomen and pelvis was performed following the standard protocol without IV contrast. COMPARISON:  11/13/2017 FINDINGS: Lower chest: Lung bases are clear. There is evidence of pneumomediastinum with air seen adjacent the anterior heart and distal esophagus. Mild cervical true wall thickening of the distal esophagus. Hepatobiliary: Previous cholecystectomy. Liver and biliary tree are within normal. Pancreas: Subtle low-attenuation in the region of the pancreatic head without significant change. No adjacent inflammatory change or free fluid. Spleen: Normal. Adrenals/Urinary Tract: Adrenal glands are normal. Kidneys are normal in size with bilateral mild hydronephrosis right greater than left. No nephrolithiasis. There is subcapsular low-attenuation over the upper pole right kidney. Subtle patchy appearance to the renal cortex bilaterally. These findings may be due to infection. There is minimal fluid over the left perinephric region. No evidence of ureteral stones. Bladder is normal. Stomach/Bowel: Stomach and small bowel are normal. Appendix is normal. Colon is normal. Vascular/Lymphatic: Abdominal aorta is normal in caliber.  There is minimal calcified plaque over the abdominal aorta. No evidence of adenopathy. Reproductive: Normal. Other: No evidence of free fluid or focal inflammatory change. Musculoskeletal: Mild sclerosis along the sacroiliac joints. IMPRESSION: 1. Evidence of pneumomediastinum. Mild wall thickening of the distal esophagus. Recommend clinical correlation as findings may be due to patient's recent vomiting episodes. 2. Mild bilateral hydronephrosis right greater than left. No nephrolithiasis or ureteral stones. Subtle  patchy appearance to the renal cortex bilaterally with subcapsular low-attenuation over the upper pole right kidney. Findings may be due to infection. Recommend clinical correlation and follow-up imaging as clinically indicated. 3. Subtle indeterminate low-attenuation in the region of the pancreatic head without significant change. Consider MRI for further evaluation to exclude pancreatic mass. 4. Aortic atherosclerosis. Aortic Atherosclerosis (ICD10-I70.0). Electronically Signed: By: Marin Olp M.D. On: 08/15/2020 10:35    Labs: BMET Recent Labs  Lab 08/14/20 0318 08/14/20 1432 08/15/20 0300 08/15/20 1241 08/16/20 0328  NA 131*  --  132* 132* 132*  K 3.1*  --  3.0* 4.1 2.9*  CL 87*  --  92* 95* 97*  CO2 24  --  22  --  19*  GLUCOSE 102*  --  96 81 173*  BUN 114*  --  106* 124* 97*  CREATININE 10.73* 8.19* 10.02* 9.00* 8.64*  CALCIUM 7.3*  --  7.1*  --  8.2*  PHOS  --   --   --   --  5.9*   CBC Recent Labs  Lab 08/14/20 0318 08/14/20 0632 08/15/20 0300 08/15/20 1241 08/15/20 1451 08/15/20 2149 08/16/20 0328  WBC 15.7* 16.7* 13.9*  --   --   --  7.9  NEUTROABS  --  14.5*  --   --   --   --  7.1  HGB 6.4* 4.6* 5.3* 7.1* 6.4* 8.9* 7.4*  HCT 20.6* 15.0* 17.2* 21.0* 19.2* 27.8* 22.9*  MCV 90.7 91.5 90.5  --   --   --  89.5  PLT 348 343 274  --  274  --  277    Medications:    . Chlorhexidine Gluconate Cloth  6 each Topical Q0600  . folic acid  1 mg Intravenous  Daily  . pantoprazole (PROTONIX) IV  40 mg Intravenous Q12H  . sodium chloride flush  3 mL Intravenous Q12H      Otelia Santee, MD 08/16/2020, 8:34 AM

## 2020-08-16 NOTE — Progress Notes (Signed)
Walked by patient room and saw her eating food brought by a relative. Talked to patient regarding her NPO status and explained in detail why she needs to be NPO, patient started yelling and she continue eating. Notified Dr. Tyrell Antonio.

## 2020-08-16 NOTE — Progress Notes (Signed)
[address]       [place] [address]             [phone]     CARDIOTHORACIC SURGERY PROGRESS NOTE  1 Day Post-Op  S/P Procedure(s) (LRB): CYSTOSCOPY WITH RETROGRADE PYELOGRAM/URETERAL STENT PLACEMENT (Bilateral)  Subjective: Somewhat belligerent, insisting on eating food.  Patient reportedly was given a regular diet yesterday evening and according to the patient tolerated well.  She denies any history of difficulty swallowing, odynophagia, chest pain, or abdominal pain.  She admits to a 2-week history of protracted nausea and vomiting with periods of severe retching.  She claims that she has not felt nauseous since she has been in the hospital.   Objective: Vital signs in last 24 hours: Temp:  [97.7 F (36.5 C)-98.8 F (37.1 C)] 98.6 F (37 C) (01/17 0919) Pulse Rate:  [70-99] 72 (01/17 0919) Cardiac Rhythm: Normal sinus rhythm (01/17 0700) Resp:  [11-18] 18 (01/17 0919) BP: (132-150)/(76-95) 149/82 (01/17 0919) SpO2:  [98 %-100 %] 98 % (01/17 0919)  Physical Exam:  Rhythm:   sinus  Breath sounds: clear  Heart sounds:  RRR  Incisions:  n/a  Abdomen:  Soft, non-distended, non-tender  Extremities:  Warm, well-perfused   Intake/Output from previous day: 01/16 0701 - 01/17 0700 In: 1152 [I.V.:510; Blood:642] Out: 801 [Urine:800; Stool:1] Intake/Output this shift: No intake/output data recorded.  Lab Results: Recent Labs    08/15/20 0300 08/15/20 1241 08/15/20 1451 08/15/20 2149 08/16/20 0328  WBC 13.9*  --   --   --  7.9  HGB 5.3*   < > 6.4* 8.9* 7.4*  HCT 17.2*   < > 19.2* 27.8* 22.9*  PLT 274  --  274  --  277   < > = values in this interval not displayed.   BMET:  Recent Labs    08/15/20 0300 08/15/20 1241 08/16/20 0328  NA 132* 132* 132*  K 3.0* 4.1 2.9*  CL 92* 95* 97*  CO2 22  --  19*  GLUCOSE 96 81 173*  BUN 106* 124* 97*  CREATININE 10.02* 9.00* 8.64*  CALCIUM 7.1*  --  8.2*    CBG (last 3)  No results for  input(s): GLUCAP in the last 72 hours. PT/INR:   Recent Labs    08/15/20 1451  LABPROT 15.1  INR 1.2    CXR:  N/A  Assessment/Plan:  Patient has radiographic evidence of spontaneous pneumomediastinum without clear evidence of perforation of the esophagus that has developed in the setting of protracted nausea and vomiting with retching.  Since hospital admission the patient has remained afebrile and white blood count is normal.  She denies any ongoing symptoms of difficulty swallowing, odynophagia, chest pain, or abdominal pain.  Against my specific recommendation the patient was given a regular diet by the hospitalist team yesterday evening.  Fortunately, the patient appears to have tolerated her diet without any difficulty.  In the meanwhile the patient's acute on chronic renal failure seems to be trending in the right direction with adequate urine output and slightly decreased BUN/creatinine.  I explained the circumstances by which the spontaneous pneumomediastinum occurred to the patient at the bedside and concerns regarding the possibility for serious injury to the esophagus should recurrent nausea, vomiting, and retching develop.  I would continue to recommend a more conservative approach but under the current circumstances I will defer any further management and follow-up to the hospitalist team.  Please call if we can  be of further assistance.   I spent in excess of 15 minutes during the conduct of this hospital encounter and >50% of this time involved direct face-to-face encounter with the patient for counseling and/or coordination of their care.        Rexene Alberts, MD 08/16/2020 10:34 AM

## 2020-08-16 NOTE — Progress Notes (Signed)
PROGRESS NOTE    Marie Brewer  DPO:[id number] DOB: [birth year] DOA: 08/14/2020 PCP: Pcp, No   Brief Narrative: 40 year old with past medical history significant for polysubstance abuse, remote history of uterine and cervical cancer s/p surgery abd radiation,  anemia.  She reports that she stopped using heroin 2 months ago.  She report nausea and vomiting for the past month.  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  Evaluation in the ED patient was found to be severely anemic with a hemoglobin of 5, creatinine of 10.  Patient received IV fluids, antiemetic triad was called for admission  During evaluation for acute renal failure CT renal protocol showed pneumomediastinum, mild bilateral hydronephrosis right greater than left.  Subtle patchy appearance to the renal cortex bilaterally with subcapsular low-attenuation over the upper pole right kidney.  Finding may be due to infection.  Recommend clinical correlation and follow-up imaging as clinical indicated.  Subsequently CT chest and abdomen was obtained which showed moderate amount of pneumomediastinum throughout the chest.  No definitive focal esophageal injury identified.  Focal dependent debris's over the upper trachea which may be due to aspiration versus posttraumatic.  Mild heterogeneous low attenuation over the pancreatic head stable compared to previous exam.  Pancreatic mass is possible.  Recommend MRI on elective basis.   CVTS was consulted and recommended patient to be n.p.o. and started on IV Zosyn.  Urology was consulted and is planning retrograde pyelogram and bilateral JJ stent placement.   Assessment & Plan:   Active Problems:   Polysubstance abuse (HCC)   ARF (acute renal failure) (HCC)   Acute renal failure (ARF) (HCC)   Anemia, unspecified   Anemia  1-Severe Anemia: Patient presents with hb 6---5.  Received one unit  PRBC, hb increase 7.  Urology planning two more unit pre op.  Hematology consulted. LHD, peripheral smear, haptoglobin, DIC panel.  Patient denies melena, hematochezia.  Started folic acid.  Hb stable at 7.4   2-Acute Renal failure;  -Prior cr per records 1.3. -Presents with Cr at 10.  -AKI: combination obstructive and pre renal.  -Renal US; showed right hydronephrosis, no left  hydronephrosis although left kidney is poorly delineated.  -CT stone; mild bilateral hydronephrosis right greater than left.  No nephrolithiasis or ureteral stone.  Subtle patchy appearance to the renal cortex bilaterally with subcapsular low-attenuation over the upper pole right kidney.  Finding may be due to infection. -Underwent  retrograde pyelogram and bilateral JJ stent placement 1/16..  -Continue with IV fluids.  -cr trending down. 9--8  3-Pneumomediastinum; -Incidental finding on CT renal protocol. -CT chest and abdomen: Moderate amount of pneumomediastinum throughout the chest.  Not definitive focal esophageal injury 25.  No definitive etiology is to live for this pneumomediastinum. -Continue with n.p.o. status. -Plan to start  IV Zosyn. -CVTS consulted, Appreciate Dr Roxy Manns evaluation and recommendations.  -Would continue with NPO status. Her diet was change last night. Explain to patient importance of not eating.   4-Abnormal attenuation pancreas on CT;  -Patient will need MRI to rule out pancreatic Mass.   5-Hyponatremia; continue with IV fluids.  Hypomagnesemia; replete IV> repeat labs in am.   6-Hypokalemia; replete IV.   7-UTI; continue with IV zosyn.  Urine culture growing strep agalactiae.        Estimated body mass index is 39.12 kg/m as calculated from the following:   Height as of 05/09/20:  $'5\' 6"'a$  (1.676 m).   Weight as of 05/09/20: 110 kg.   DVT prophylaxis: SCD Code Status: DNR> patient wishes to be DNR Family Communication: care discussed with patient and mother over  phone Disposition Plan:  Status is: Inpatient  Remains inpatient appropriate because:Hemodynamically unstable   Dispo: The patient is from: Home              Anticipated d/c is to: Home              Anticipated d/c date is: 3 days              Patient currently is not medically stable to d/c.        Consultants:   Urology  Nephrology  Hematology  CVTS  Procedures:   Renal US.     Antimicrobials:    Subjective: She was fed last night. I have change her diet to NPO again.  She is upset and wants to eat. She wants to leave AMA. I explain to her importance of remain in the hospital and compliance with care.  She wishes to be DNR. She does not want to be intubated or resuscitated. She is done with chemotherapy or radiation.  Will ask Palliative to discussed with patient.   Objective: Vitals:   08/15/20 1656 08/15/20 1857 08/15/20 2042 08/16/20 0631  BP: 134/76 134/77 (!) 150/77 (!) 149/87  Pulse: 73 76 71 75  Resp: $Remo'16 16 14 16  'oHxSZ$ Temp: 98.7 F (37.1 C) 98.6 F (37 C) 98.2 F (36.8 C) 98.7 F (37.1 C)  TempSrc:  Oral Oral Oral  SpO2: 98% 98% 100% 100%    Intake/Output Summary (Last 24 hours) at 08/16/2020 0853 Last data filed at 08/16/2020 0600 Gross per 24 hour  Intake 1152 ml  Output 801 ml  Net 351 ml   There were no vitals filed for this visit.  Examination:  General exam: NAD Respiratory system: CTA Cardiovascular system: S 1, S 2 RRR Gastrointestinal system: BS present, soft, nt Central nervous system: alert Extremities: Symmetric power    Data Reviewed: I have personally reviewed following labs and imaging studies  CBC: Recent Labs  Lab 08/14/20 0318 08/14/20 0632 08/15/20 0300 08/15/20 1241 08/15/20 1451 08/15/20 2149 08/16/20 0328  WBC 15.7* 16.7* 13.9*  --   --   --  7.9  NEUTROABS  --  14.5*  --   --   --   --  7.1  HGB 6.4* 4.6* 5.3* 7.1* 6.4* 8.9* 7.4*  HCT 20.6* 15.0* 17.2* 21.0* 19.2* 27.8* 22.9*  MCV 90.7 91.5 90.5   --   --   --  89.5  PLT 348 343 274  --  274  --  680   Basic Metabolic Panel: Recent Labs  Lab 08/14/20 0318 08/14/20 0632 08/14/20 1432 08/15/20 0300 08/15/20 1241 08/16/20 0328  NA 131*  --   --  132* 132* 132*  K 3.1*  --   --  3.0* 4.1 2.9*  CL 87*  --   --  92* 95* 97*  CO2 24  --   --  22  --  19*  GLUCOSE 102*  --   --  96 81 173*  BUN 114*  --   --  106* 124* 97*  CREATININE 10.73*  --  8.19* 10.02* 9.00* 8.64*  CALCIUM 7.3*  --   --  7.1*  --  8.2*  MG  --  1.2*  --   --   --  1.7  PHOS  --   --   --   --   --  5.9*   GFR: CrCl cannot be calculated (Unknown ideal weight.). Liver Function Tests: Recent Labs  Lab 08/14/20 0318 08/15/20 0300 08/16/20 0328  AST 11* 9*  --   ALT 9 8  --   ALKPHOS 111 91  --   BILITOT 0.8 0.6  --   PROT 8.8* 7.5  --   ALBUMIN 2.6* 2.3* 2.2*   Recent Labs  Lab 08/14/20 0318  LIPASE 82*   No results for input(s): AMMONIA in the last 168 hours. Coagulation Profile: Recent Labs  Lab 08/15/20 1451  INR 1.2   Cardiac Enzymes: No results for input(s): CKTOTAL, CKMB, CKMBINDEX, TROPONINI in the last 168 hours. BNP (last 3 results) No results for input(s): PROBNP in the last 8760 hours. HbA1C: No results for input(s): HGBA1C in the last 72 hours. CBG: No results for input(s): GLUCAP in the last 168 hours. Lipid Profile: No results for input(s): CHOL, HDL, LDLCALC, TRIG, CHOLHDL, LDLDIRECT in the last 72 hours. Thyroid Function Tests: No results for input(s): TSH, T4TOTAL, FREET4, T3FREE, THYROIDAB in the last 72 hours. Anemia Panel: Recent Labs    08/15/20 0300 08/15/20 1451  VITAMINB12 537  --   FOLATE 3.6*  --   FERRITIN 495*  --   TIBC 203*  --   IRON 58  --   RETICCTPCT 2.3 2.3   Sepsis Labs: Recent Labs  Lab 08/14/20 0635 08/14/20 1021  LATICACIDVEN 1.4 1.1    Recent Results (from the past 240 hour(s))  Urine culture     Status: Abnormal   Collection Time: 08/14/20  5:20 AM   Specimen: Urine, Random   Result Value Ref Range Status   Specimen Description URINE, RANDOM  Final   Special Requests NONE  Final   Culture (A)  Final    >=100,000 COLONIES/mL GROUP B STREP(S.AGALACTIAE)ISOLATED TESTING AGAINST S. AGALACTIAE NOT ROUTINELY PERFORMED DUE TO PREDICTABILITY OF AMP/PEN/VAN SUSCEPTIBILITY. Performed at [facility], [place] [address], [place], [place] [address]    Report Status 08/15/2020 FINAL  Final  Resp Panel by RT-PCR (Flu A&B, Covid) Nasopharyngeal Swab     Status: None   Collection Time: 08/14/20  6:00 AM   Specimen: Nasopharyngeal Swab; Nasopharyngeal(NP) swabs in vial transport medium  Result Value Ref Range Status   SARS Coronavirus 2 by RT PCR NEGATIVE NEGATIVE Final    Comment: (NOTE) SARS-CoV-2 target nucleic acids are NOT DETECTED.  The SARS-CoV-2 RNA is generally detectable in upper respiratory specimens during the acute phase of infection. The lowest concentration of SARS-CoV-2 viral copies this assay can detect is 138 copies/mL. A negative result does not preclude SARS-Cov-2 infection and should not be used as the sole basis for treatment or other patient management decisions. A negative result may occur with  improper specimen collection/handling, submission of specimen other than nasopharyngeal swab, presence of viral mutation(s) within the areas targeted by this assay, and inadequate number of viral copies(<138 copies/mL). A negative result must be combined with clinical observations, patient history, and epidemiological information. The expected result is Negative.  Fact Sheet for Patients:  EntrepreneurPulse.com.au  Fact Sheet for Healthcare Providers:  IncredibleEmployment.be  This test is no t yet approved or cleared by the [place] FDA and  has been authorized for detection and/or diagnosis of SARS-CoV-2 by FDA under an Emergency Use Authorization (EUA). This EUA will remain  in effect (meaning this test  can be used) for the duration of the COVID-19 declaration under Section 564(b)(1) of the Act, 21 U.S.C.section 360bbb-3(b)(1), unless the authorization is terminated  or revoked sooner.       Influenza A by PCR NEGATIVE NEGATIVE Final   Influenza B by PCR NEGATIVE NEGATIVE Final    Comment: (NOTE) The Xpert Xpress SARS-CoV-2/FLU/RSV plus assay is intended as an aid in the diagnosis of influenza from Nasopharyngeal swab specimens and should not be used as a sole basis for treatment. Nasal washings and aspirates are unacceptable for Xpert Xpress SARS-CoV-2/FLU/RSV testing.  Fact Sheet for Patients: EntrepreneurPulse.com.au  Fact Sheet for Healthcare Providers: IncredibleEmployment.be  This test is not yet approved or cleared by the [place] FDA and has been authorized for detection and/or diagnosis of SARS-CoV-2 by FDA under an Emergency Use Authorization (EUA). This EUA will remain in effect (meaning this test can be used) for the duration of the COVID-19 declaration under Section 564(b)(1) of the Act, 21 U.S.C. section 360bbb-3(b)(1), unless the authorization is terminated or revoked.  Performed at [facility], [place] [address], [place], [place] [address]   Blood culture (routine x 2)     Status: None (Preliminary result)   Collection Time: 08/14/20  6:32 AM   Specimen: BLOOD  Result Value Ref Range Status   Specimen Description BLOOD SITE NOT SPECIFIED  Final   Special Requests   Final    BOTTLES DRAWN AEROBIC AND ANAEROBIC Blood Culture adequate volume   Culture   Final    NO GROWTH < 24 HOURS Performed at [facility], [place] [address] [place], [place] [address]    Report Status PENDING  Incomplete  Blood culture (routine x 2)     Status: None (Preliminary result)   Collection Time: 08/14/20 10:10 AM   Specimen: BLOOD LEFT HAND  Result Value Ref Range Status   Specimen Description BLOOD LEFT HAND  Final   Special  Requests   Final    BOTTLES DRAWN AEROBIC AND ANAEROBIC Blood Culture adequate volume   Culture   Final    NO GROWTH < 24 HOURS Performed at [facility], [place] [address], [place], [place] [address]    Report Status PENDING  Incomplete         Radiology Studies: CT ABDOMEN WO CONTRAST  Result Date: 08/15/2020 CLINICAL DATA:  Possible esophageal perforation. Pneumomediastinum on recent abdominal CT. Nausea and vomiting. Acute on chronic anemia. History of heroin abuse. EXAM: CT CHEST AND ABDOMEN WITHOUT CONTRAST TECHNIQUE: Multidetector CT imaging of the chest and abdomen was performed following the standard protocol without intravenous contrast. Patient was given 50 mL Omnipaque 350 dilute and 12 ounces of water orally immediately before scanning. COMPARISON:  Noncontrast abdominopelvic CT earlier today as well as previous abdominal CT 11/13/2017 FINDINGS: CT CHEST FINDINGS WITHOUT CONTRAST Cardiovascular: Mild cardiomegaly. Thoracic aorta is otherwise unremarkable. Remaining vascular structures are unremarkable. Mediastinum/Nodes: Evidence of moderate pneumomediastinum throughout the chest. As this is visualized on the most superior image at the neck base extends through the chest to the diaphragm. Minimal cervical wall thickening throughout the esophagus. Contrast is present throughout the course of the esophagus without evidence of extravasation of contrast. No definite focal esophageal injury is identified. No mediastinal or hilar adenopathy. Remaining mediastinal structures are unremarkable. Lungs/Pleura: Lungs are well inflated without focal airspace consolidation or effusion. No pneumothorax. Small thin-walled cysts over the medial right apex. Focal debris over the dependent portion of the upper trachea. Airways  are normal. Musculoskeletal: No acute findings. CT ABDOMEN FINDINGS WITHOUT CONTRAST Hepatobiliary: Previous cholecystectomy. Liver and biliary tree are unremarkable. Pancreas: No  change in recently seen somewhat heterogeneous low-attenuation over the pancreatic head stable compared to the previous exam from 2019. No perinephric inflammation or fluid. Spleen: Normal. Adrenals/Urinary Tract: Adrenal glands are normal. Kidneys are normal in size with mild bilateral hydronephrosis unchanged. Subcapsular low-attenuation over the upper pole right kidney unchanged. Minimal left perinephric fluid unchanged. No renal stones. Visualized ureters are unremarkable. Stomach/Bowel: Contrast within the stomach which is otherwise unremarkable. Visualized bowel is unremarkable. Vascular/Lymphatic: Abdominal aorta is normal caliber. Minimal calcified plaque over the proximal abdominal aorta. No adenopathy. Other: No other changes. Musculoskeletal: No acute findings. IMPRESSION: 1. Moderate amount of pneumomediastinum throughout the chest. No definite focal esophageal injury identified. No definite etiology visualized for this pneumomediastinum. 2. Subcapsular low-density collection over the upper pole right kidney with bilateral hydronephrosis as these findings are stable. Findings could be seen due to infection. Recommend clinical correlation. 3. Moderate focal dependent debris over the upper trachea which may be due to aspiration versus posttraumatic. 4. Mild heterogeneous low-attenuation over the pancreatic head stable compared to the previous exam from 2019. Pancreatic mass is possible. Continue to recommend further evaluation on elective basis with MRI. 5. Aortic atherosclerosis. Aortic Atherosclerosis (ICD10-I70.0). Electronically Signed   By: Elberta Fortis M.D.   On: 08/15/2020 12:17   CT CHEST WO CONTRAST  Result Date: 08/15/2020 CLINICAL DATA:  Possible esophageal perforation. Pneumomediastinum on recent abdominal CT. Nausea and vomiting. Acute on chronic anemia. History of heroin abuse. EXAM: CT CHEST AND ABDOMEN WITHOUT CONTRAST TECHNIQUE: Multidetector CT imaging of the chest and abdomen was  performed following the standard protocol without intravenous contrast. Patient was given 50 mL Omnipaque 350 dilute and 12 ounces of water orally immediately before scanning. COMPARISON:  Noncontrast abdominopelvic CT earlier today as well as previous abdominal CT 11/13/2017 FINDINGS: CT CHEST FINDINGS WITHOUT CONTRAST Cardiovascular: Mild cardiomegaly. Thoracic aorta is otherwise unremarkable. Remaining vascular structures are unremarkable. Mediastinum/Nodes: Evidence of moderate pneumomediastinum throughout the chest. As this is visualized on the most superior image at the neck base extends through the chest to the diaphragm. Minimal cervical wall thickening throughout the esophagus. Contrast is present throughout the course of the esophagus without evidence of extravasation of contrast. No definite focal esophageal injury is identified. No mediastinal or hilar adenopathy. Remaining mediastinal structures are unremarkable. Lungs/Pleura: Lungs are well inflated without focal airspace consolidation or effusion. No pneumothorax. Small thin-walled cysts over the medial right apex. Focal debris over the dependent portion of the upper trachea. Airways are normal. Musculoskeletal: No acute findings. CT ABDOMEN FINDINGS WITHOUT CONTRAST Hepatobiliary: Previous cholecystectomy. Liver and biliary tree are unremarkable. Pancreas: No change in recently seen somewhat heterogeneous low-attenuation over the pancreatic head stable compared to the previous exam from 2019. No perinephric inflammation or fluid. Spleen: Normal. Adrenals/Urinary Tract: Adrenal glands are normal. Kidneys are normal in size with mild bilateral hydronephrosis unchanged. Subcapsular low-attenuation over the upper pole right kidney unchanged. Minimal left perinephric fluid unchanged. No renal stones. Visualized ureters are unremarkable. Stomach/Bowel: Contrast within the stomach which is otherwise unremarkable. Visualized bowel is unremarkable.  Vascular/Lymphatic: Abdominal aorta is normal caliber. Minimal calcified plaque over the proximal abdominal aorta. No adenopathy. Other: No other changes. Musculoskeletal: No acute findings. IMPRESSION: 1. Moderate amount of pneumomediastinum throughout the chest. No definite focal esophageal injury identified. No definite etiology visualized for this pneumomediastinum. 2. Subcapsular low-density collection over the upper pole  right kidney with bilateral hydronephrosis as these findings are stable. Findings could be seen due to infection. Recommend clinical correlation. 3. Moderate focal dependent debris over the upper trachea which may be due to aspiration versus posttraumatic. 4. Mild heterogeneous low-attenuation over the pancreatic head stable compared to the previous exam from 2019. Pancreatic mass is possible. Continue to recommend further evaluation on elective basis with MRI. 5. Aortic atherosclerosis. Aortic Atherosclerosis (ICD10-I70.0). Electronically Signed   By: Marin Olp M.D.   On: 08/15/2020 12:17   DG Retrograde Pyelogram  Result Date: 08/15/2020 CLINICAL DATA:  40 year old with bilateral hydronephrosis. EXAM: RETROGRADE PYELOGRAM COMPARISON:  CT abdomen 08/15/2018 FLUOROSCOPY TIME:  56.9 seconds FINDINGS: Right retrograde exam demonstrates opacification of the right ureter with moderate dilatation of the right renal pelvis and right renal calices. Evidence for contrast in the duodenum or small bowel loops in the right upper abdomen. Right double-J ureter stent was placed. Opacification of the left ureter. Incomplete evaluation of the proximal left ureter. Moderate dilatation of left renal calices. Placement of left ureter stent. IMPRESSION: Bilateral hydronephrosis and placement of bilateral ureter stents. Electronically Signed   By: Markus Daft M.D.   On: 08/15/2020 13:53   DG Bone Survey Met  Result Date: 08/16/2020 CLINICAL DATA:  Anemia.  History of cervical cancer. EXAM: METASTATIC  BONE SURVEY COMPARISON:  None. FINDINGS: No lytic or sclerotic lesion is seen involving the visualized skeleton, including the spine, pelvis, rib cage or extremities. Mild degenerative disc disease is noted at C5-6 and C6-7. IMPRESSION: No significant abnormality seen involving the visualized skeleton. Electronically Signed   By: Marijo Conception M.D.   On: 08/16/2020 08:11   CT RENAL STONE STUDY  Addendum Date: 08/15/2020   ADDENDUM REPORT: 08/15/2020 10:57 ADDENDUM: These results were called by telephone at the time of interpretation on 08/15/2020 at 10:43 a.m. to provider [facility] , who verbally acknowledged these results. Electronically Signed   By: Marin Olp M.D.   On: 08/15/2020 10:57   Result Date: 08/15/2020 CLINICAL DATA:  Acute renal failure. Heroin abuse. Nausea and vomiting with acute on chronic anemia. EXAM: CT ABDOMEN AND PELVIS WITHOUT CONTRAST TECHNIQUE: Multidetector CT imaging of the abdomen and pelvis was performed following the standard protocol without IV contrast. COMPARISON:  11/13/2017 FINDINGS: Lower chest: Lung bases are clear. There is evidence of pneumomediastinum with air seen adjacent the anterior heart and distal esophagus. Mild cervical true wall thickening of the distal esophagus. Hepatobiliary: Previous cholecystectomy. Liver and biliary tree are within normal. Pancreas: Subtle low-attenuation in the region of the pancreatic head without significant change. No adjacent inflammatory change or free fluid. Spleen: Normal. Adrenals/Urinary Tract: Adrenal glands are normal. Kidneys are normal in size with bilateral mild hydronephrosis right greater than left. No nephrolithiasis. There is subcapsular low-attenuation over the upper pole right kidney. Subtle patchy appearance to the renal cortex bilaterally. These findings may be due to infection. There is minimal fluid over the left perinephric region. No evidence of ureteral stones. Bladder is normal. Stomach/Bowel: Stomach  and small bowel are normal. Appendix is normal. Colon is normal. Vascular/Lymphatic: Abdominal aorta is normal in caliber. There is minimal calcified plaque over the abdominal aorta. No evidence of adenopathy. Reproductive: Normal. Other: No evidence of free fluid or focal inflammatory change. Musculoskeletal: Mild sclerosis along the sacroiliac joints. IMPRESSION: 1. Evidence of pneumomediastinum. Mild wall thickening of the distal esophagus. Recommend clinical correlation as findings may be due to patient's recent vomiting episodes. 2. Mild bilateral hydronephrosis  right greater than left. No nephrolithiasis or ureteral stones. Subtle patchy appearance to the renal cortex bilaterally with subcapsular low-attenuation over the upper pole right kidney. Findings may be due to infection. Recommend clinical correlation and follow-up imaging as clinically indicated. 3. Subtle indeterminate low-attenuation in the region of the pancreatic head without significant change. Consider MRI for further evaluation to exclude pancreatic mass. 4. Aortic atherosclerosis. Aortic Atherosclerosis (ICD10-I70.0). Electronically Signed: By: Marin Olp M.D. On: 08/15/2020 10:35        Scheduled Meds: . Chlorhexidine Gluconate Cloth  6 each Topical Q0600  . folic acid  1 mg Intravenous Daily  . pantoprazole (PROTONIX) IV  40 mg Intravenous Q12H  . sodium chloride flush  3 mL Intravenous Q12H   Continuous Infusions: . sodium chloride 50 mL/hr at 08/15/20 0628  . magnesium sulfate bolus IVPB 2 g (08/16/20 0824)  . meropenem (MERREM) IV 1 g (08/15/20 2310)  . potassium chloride       LOS: 2 days    Time spent: 35 minutes.     Elmarie Shiley, MD [facility]   If 7PM-7AM, please contact night-coverage [email or web address]  08/16/2020, 8:53 AM

## 2020-08-17 DIAGNOSIS — Z515 Encounter for palliative care: Secondary | ICD-10-CM

## 2020-08-17 LAB — CBC
HCT: 24.9 % — ABNORMAL LOW (ref 36.0–46.0)
Hemoglobin: 8.4 g/dL — ABNORMAL LOW (ref 12.0–15.0)
MCH: 29.5 pg (ref 26.0–34.0)
MCHC: 33.7 g/dL (ref 30.0–36.0)
MCV: 87.4 fL (ref 80.0–100.0)
Platelets: 202 10*3/uL (ref 150–400)
RBC: 2.85 MIL/uL — ABNORMAL LOW (ref 3.87–5.11)
RDW: 14.6 % (ref 11.5–15.5)
WBC: 8.6 10*3/uL (ref 4.0–10.5)
nRBC: 0 % (ref 0.0–0.2)

## 2020-08-17 LAB — MPO/PR-3 (ANCA) ANTIBODIES
ANCA Proteinase 3: <3.5 U/mL (ref 0.0–3.5)
Myeloperoxidase Abs: <9 U/mL (ref 0.0–9.0)

## 2020-08-17 LAB — BASIC METABOLIC PANEL
Anion gap: 17 — ABNORMAL HIGH (ref 5–15)
BUN: 93 mg/dL — ABNORMAL HIGH (ref 6–20)
CO2: 19 mmol/L — ABNORMAL LOW (ref 22–32)
Calcium: 8.4 mg/dL — ABNORMAL LOW (ref 8.9–10.3)
Chloride: 99 mmol/L (ref 98–111)
Creatinine, Ser: 7.65 mg/dL — ABNORMAL HIGH (ref 0.44–1.00)
GFR, Estimated: 6 mL/min — ABNORMAL LOW (ref >60)
Glucose, Bld: 77 mg/dL (ref 70–99)
Potassium: 3.1 mmol/L — ABNORMAL LOW (ref 3.5–5.1)
Sodium: 135 mmol/L (ref 135–145)

## 2020-08-17 LAB — ANTINUCLEAR ANTIBODIES, IFA: ANA Ab, IFA: NEGATIVE

## 2020-08-17 LAB — GLOMERULAR BASEMENT MEMBRANE ANTIBODIES: GBM Ab: 4 units (ref 0–20)

## 2020-08-17 LAB — C4 COMPLEMENT: Complement C4, Body Fluid: 37 mg/dL (ref 12–38)

## 2020-08-17 LAB — ANTI-DNA ANTIBODY, DOUBLE-STRANDED: ds DNA Ab: <1 IU/mL (ref 0–9)

## 2020-08-17 LAB — C3 COMPLEMENT: C3 Complement: 152 mg/dL (ref 82–167)

## 2020-08-17 LAB — ANTISTREPTOLYSIN O TITER: ASO: 1002 IU/mL — ABNORMAL HIGH (ref 0.0–200.0)

## 2020-08-17 MED ORDER — POTASSIUM CHLORIDE CRYS ER 20 MEQ PO TBCR
40.0000 meq | EXTENDED_RELEASE_TABLET | Freq: Once | ORAL | Status: AC
  Administered 2020-08-17: 40 meq via ORAL
  Filled 2020-08-17: qty 2

## 2020-08-17 MED ORDER — POTASSIUM CHLORIDE 10 MEQ/100ML IV SOLN
10.0000 meq | INTRAVENOUS | Status: DC
  Administered 2020-08-17: 10 meq via INTRAVENOUS
  Filled 2020-08-17 (×2): qty 100

## 2020-08-17 NOTE — Progress Notes (Addendum)
HEMATOLOGY-ONCOLOGY PROGRESS NOTE  SUBJECTIVE: The patient reports that she is feeling better today.  She is eating and drink without any difficulty.  Reports that she is voiding well.  States that she feels weak overall however when she gets out of bed.  No bleeding reported.  Oncology History  Cervical ca ([place])  09/27/2009 Initial Diagnosis   Cervical ca, IIB SCCA diagnosed in [facility]    - 01/27/2010 Radiation Therapy   Compelted chemorads and brachy   11/18/2010 Surgery   EUA  and biopsies negative     REVIEW OF SYSTEMS:   Constitutional: Denies fevers, chills  Eyes: Denies blurriness of vision Ears, nose, mouth, throat, and face: Denies mucositis or sore throat Respiratory: Denies cough, dyspnea or wheezes Cardiovascular: Denies palpitation, chest discomfort Gastrointestinal:  Denies nausea, heartburn or change in bowel habits Skin: Denies abnormal skin rashes Lymphatics: Denies new lymphadenopathy or easy bruising Neurological:Denies numbness, tingling or new weaknesses Behavioral/Psych: Mood is stable, no new changes  Extremities: No lower extremity edema All other systems were reviewed with the patient and are negative.  I have reviewed the past medical history, past surgical history, social history and family history with the patient and they are unchanged from previous note.   PHYSICAL EXAMINATION:  Vitals:   08/17/20 0422 08/17/20 0938  BP: (!) 161/85 (!) 153/92  Pulse: 66 80  Resp: 16 18  Temp: 98.8 F (37.1 C) 98.2 F (36.8 C)  SpO2: 100% 98%   Filed Weights   08/17/20 0419  Weight: 76.9 kg    Intake/Output from previous day: 01/17 0701 - 01/18 0700 In: 962 [I.V.:762; IV Piggyback:200] Out: 1050 [Urine:1050]  GENERAL:alert, no distress and comfortable SKIN: No rashes, few scattered scabs on her legs LUNGS: clear to auscultation and percussion with normal breathing effort HEART: regular rate & rhythm and no murmurs and no lower extremity  edema ABDOMEN:abdomen soft, non-tender and normal bowel sounds NEURO: alert & oriented x 3 with fluent speech, no focal motor/sensory deficits  LABORATORY DATA:  I have reviewed the data as listed CMP Latest Ref Rng & Units 08/17/2020 08/16/2020 08/15/2020  Glucose 70 - 99 mg/dL 77 173(H) 81  BUN 6 - 20 mg/dL 93(H) 97(H) 124(H)  Creatinine 0.44 - 1.00 mg/dL 7.65(H) 8.64(H) 9.00(H)  Sodium 135 - 145 mmol/L 135 132(L) 132(L)  Potassium 3.5 - 5.1 mmol/L 3.1(L) 2.9(L) 4.1  Chloride 98 - 111 mmol/L 99 97(L) 95(L)  CO2 22 - 32 mmol/L 19(L) 19(L) -  Calcium 8.9 - 10.3 mg/dL 8.4(L) 8.2(L) -  Total Protein 6.5 - 8.1 g/dL - - -  Total Bilirubin 0.3 - 1.2 mg/dL - - -  Alkaline Phos 38 - 126 U/L - - -  AST 15 - 41 U/L - - -  ALT 0 - 44 U/L - - -    Lab Results  Component Value Date   WBC 8.6 08/17/2020   HGB 8.4 (L) 08/17/2020   HCT 24.9 (L) 08/17/2020   MCV 87.4 08/17/2020   PLT 202 08/17/2020   NEUTROABS 7.1 08/16/2020    CT ABDOMEN WO CONTRAST  Result Date: 08/15/2020 CLINICAL DATA:  Possible esophageal perforation. Pneumomediastinum on recent abdominal CT. Nausea and vomiting. Acute on chronic anemia. History of heroin abuse. EXAM: CT CHEST AND ABDOMEN WITHOUT CONTRAST TECHNIQUE: Multidetector CT imaging of the chest and abdomen was performed following the standard protocol without intravenous contrast. Patient was given 50 mL Omnipaque 350 dilute and 12 ounces of water orally immediately before scanning. COMPARISON:  Noncontrast abdominopelvic CT earlier today as well as previous abdominal CT 11/13/2017 FINDINGS: CT CHEST FINDINGS WITHOUT CONTRAST Cardiovascular: Mild cardiomegaly. Thoracic aorta is otherwise unremarkable. Remaining vascular structures are unremarkable. Mediastinum/Nodes: Evidence of moderate pneumomediastinum throughout the chest. As this is visualized on the most superior image at the neck base extends through the chest to the diaphragm. Minimal cervical wall thickening  throughout the esophagus. Contrast is present throughout the course of the esophagus without evidence of extravasation of contrast. No definite focal esophageal injury is identified. No mediastinal or hilar adenopathy. Remaining mediastinal structures are unremarkable. Lungs/Pleura: Lungs are well inflated without focal airspace consolidation or effusion. No pneumothorax. Small thin-walled cysts over the medial right apex. Focal debris over the dependent portion of the upper trachea. Airways are normal. Musculoskeletal: No acute findings. CT ABDOMEN FINDINGS WITHOUT CONTRAST Hepatobiliary: Previous cholecystectomy. Liver and biliary tree are unremarkable. Pancreas: No change in recently seen somewhat heterogeneous low-attenuation over the pancreatic head stable compared to the previous exam from 2019. No perinephric inflammation or fluid. Spleen: Normal. Adrenals/Urinary Tract: Adrenal glands are normal. Kidneys are normal in size with mild bilateral hydronephrosis unchanged. Subcapsular low-attenuation over the upper pole right kidney unchanged. Minimal left perinephric fluid unchanged. No renal stones. Visualized ureters are unremarkable. Stomach/Bowel: Contrast within the stomach which is otherwise unremarkable. Visualized bowel is unremarkable. Vascular/Lymphatic: Abdominal aorta is normal caliber. Minimal calcified plaque over the proximal abdominal aorta. No adenopathy. Other: No other changes. Musculoskeletal: No acute findings. IMPRESSION: 1. Moderate amount of pneumomediastinum throughout the chest. No definite focal esophageal injury identified. No definite etiology visualized for this pneumomediastinum. 2. Subcapsular low-density collection over the upper pole right kidney with bilateral hydronephrosis as these findings are stable. Findings could be seen due to infection. Recommend clinical correlation. 3. Moderate focal dependent debris over the upper trachea which may be due to aspiration versus  posttraumatic. 4. Mild heterogeneous low-attenuation over the pancreatic head stable compared to the previous exam from 2019. Pancreatic mass is possible. Continue to recommend further evaluation on elective basis with MRI. 5. Aortic atherosclerosis. Aortic Atherosclerosis (ICD10-I70.0). Electronically Signed   By: Marin Olp M.D.   On: 08/15/2020 12:17   CT CHEST WO CONTRAST  Result Date: 08/15/2020 CLINICAL DATA:  Possible esophageal perforation. Pneumomediastinum on recent abdominal CT. Nausea and vomiting. Acute on chronic anemia. History of heroin abuse. EXAM: CT CHEST AND ABDOMEN WITHOUT CONTRAST TECHNIQUE: Multidetector CT imaging of the chest and abdomen was performed following the standard protocol without intravenous contrast. Patient was given 50 mL Omnipaque 350 dilute and 12 ounces of water orally immediately before scanning. COMPARISON:  Noncontrast abdominopelvic CT earlier today as well as previous abdominal CT 11/13/2017 FINDINGS: CT CHEST FINDINGS WITHOUT CONTRAST Cardiovascular: Mild cardiomegaly. Thoracic aorta is otherwise unremarkable. Remaining vascular structures are unremarkable. Mediastinum/Nodes: Evidence of moderate pneumomediastinum throughout the chest. As this is visualized on the most superior image at the neck base extends through the chest to the diaphragm. Minimal cervical wall thickening throughout the esophagus. Contrast is present throughout the course of the esophagus without evidence of extravasation of contrast. No definite focal esophageal injury is identified. No mediastinal or hilar adenopathy. Remaining mediastinal structures are unremarkable. Lungs/Pleura: Lungs are well inflated without focal airspace consolidation or effusion. No pneumothorax. Small thin-walled cysts over the medial right apex. Focal debris over the dependent portion of the upper trachea. Airways are normal. Musculoskeletal: No acute findings. CT ABDOMEN FINDINGS WITHOUT CONTRAST Hepatobiliary:  Previous cholecystectomy. Liver and biliary tree are unremarkable. Pancreas: No  change in recently seen somewhat heterogeneous low-attenuation over the pancreatic head stable compared to the previous exam from 2019. No perinephric inflammation or fluid. Spleen: Normal. Adrenals/Urinary Tract: Adrenal glands are normal. Kidneys are normal in size with mild bilateral hydronephrosis unchanged. Subcapsular low-attenuation over the upper pole right kidney unchanged. Minimal left perinephric fluid unchanged. No renal stones. Visualized ureters are unremarkable. Stomach/Bowel: Contrast within the stomach which is otherwise unremarkable. Visualized bowel is unremarkable. Vascular/Lymphatic: Abdominal aorta is normal caliber. Minimal calcified plaque over the proximal abdominal aorta. No adenopathy. Other: No other changes. Musculoskeletal: No acute findings. IMPRESSION: 1. Moderate amount of pneumomediastinum throughout the chest. No definite focal esophageal injury identified. No definite etiology visualized for this pneumomediastinum. 2. Subcapsular low-density collection over the upper pole right kidney with bilateral hydronephrosis as these findings are stable. Findings could be seen due to infection. Recommend clinical correlation. 3. Moderate focal dependent debris over the upper trachea which may be due to aspiration versus posttraumatic. 4. Mild heterogeneous low-attenuation over the pancreatic head stable compared to the previous exam from 2019. Pancreatic mass is possible. Continue to recommend further evaluation on elective basis with MRI. 5. Aortic atherosclerosis. Aortic Atherosclerosis (ICD10-I70.0). Electronically Signed   By: Marin Olp M.D.   On: 08/15/2020 12:17   DG Retrograde Pyelogram  Result Date: 08/15/2020 CLINICAL DATA:  40 year old with bilateral hydronephrosis. EXAM: RETROGRADE PYELOGRAM COMPARISON:  CT abdomen 08/15/2018 FLUOROSCOPY TIME:  56.9 seconds FINDINGS: Right retrograde exam  demonstrates opacification of the right ureter with moderate dilatation of the right renal pelvis and right renal calices. Evidence for contrast in the duodenum or small bowel loops in the right upper abdomen. Right double-J ureter stent was placed. Opacification of the left ureter. Incomplete evaluation of the proximal left ureter. Moderate dilatation of left renal calices. Placement of left ureter stent. IMPRESSION: Bilateral hydronephrosis and placement of bilateral ureter stents. Electronically Signed   By: Markus Daft M.D.   On: 08/15/2020 13:53   US Renal  Result Date: 08/14/2020 CLINICAL DATA:  40 year old female with acute renal failure. EXAM: RENAL / URINARY TRACT ULTRASOUND COMPLETE COMPARISON:  CT Abdomen and Pelvis 11/13/2017. FINDINGS: Right Kidney: Renal measurements: 11.1 x 5.0 x 5.7 cm = volume: 166 mL. Echogenic right renal cortex (image 4). Mild to moderate right hydronephrosis (image 9). No right renal mass or definite cyst. Left Kidney: Renal measurements: Left kidney is difficult to visualize and might be obscured by chronic renal cortical insufficiency (that measured on image 12 might not entirely be renal parenchyma (12 cm in length). No left hydronephrosis. No definite left renal mass. Bladder: Diminutive, unremarkable. Other: IMPRESSION: 1. Echogenic right kidney suggesting chronic medical renal disease but with superimposed mild to moderate right hydronephrosis. 2. No left hydronephrosis although the left kidney is poorly delineated by ultrasound. Possible left renal atrophy. 3. Follow-up noncontrast CT Abdomen and Pelvis recommended. Electronically Signed   By: Genevie Ann M.D.   On: 08/14/2020 07:26   DG Bone Survey Met  Result Date: 08/16/2020 CLINICAL DATA:  Anemia.  History of cervical cancer. EXAM: METASTATIC BONE SURVEY COMPARISON:  None. FINDINGS: No lytic or sclerotic lesion is seen involving the visualized skeleton, including the spine, pelvis, rib cage or extremities. Mild  degenerative disc disease is noted at C5-6 and C6-7. IMPRESSION: No significant abnormality seen involving the visualized skeleton. Electronically Signed   By: Marijo Conception M.D.   On: 08/16/2020 08:11   CT RENAL STONE STUDY  Addendum Date: 08/15/2020   ADDENDUM REPORT:  08/15/2020 10:57 ADDENDUM: These results were called by telephone at the time of interpretation on 08/15/2020 at 10:43 a.m. to provider [facility] , who verbally acknowledged these results. Electronically Signed   By: Marin Olp M.D.   On: 08/15/2020 10:57   Result Date: 08/15/2020 CLINICAL DATA:  Acute renal failure. Heroin abuse. Nausea and vomiting with acute on chronic anemia. EXAM: CT ABDOMEN AND PELVIS WITHOUT CONTRAST TECHNIQUE: Multidetector CT imaging of the abdomen and pelvis was performed following the standard protocol without IV contrast. COMPARISON:  11/13/2017 FINDINGS: Lower chest: Lung bases are clear. There is evidence of pneumomediastinum with air seen adjacent the anterior heart and distal esophagus. Mild cervical true wall thickening of the distal esophagus. Hepatobiliary: Previous cholecystectomy. Liver and biliary tree are within normal. Pancreas: Subtle low-attenuation in the region of the pancreatic head without significant change. No adjacent inflammatory change or free fluid. Spleen: Normal. Adrenals/Urinary Tract: Adrenal glands are normal. Kidneys are normal in size with bilateral mild hydronephrosis right greater than left. No nephrolithiasis. There is subcapsular low-attenuation over the upper pole right kidney. Subtle patchy appearance to the renal cortex bilaterally. These findings may be due to infection. There is minimal fluid over the left perinephric region. No evidence of ureteral stones. Bladder is normal. Stomach/Bowel: Stomach and small bowel are normal. Appendix is normal. Colon is normal. Vascular/Lymphatic: Abdominal aorta is normal in caliber. There is minimal calcified plaque over the  abdominal aorta. No evidence of adenopathy. Reproductive: Normal. Other: No evidence of free fluid or focal inflammatory change. Musculoskeletal: Mild sclerosis along the sacroiliac joints. IMPRESSION: 1. Evidence of pneumomediastinum. Mild wall thickening of the distal esophagus. Recommend clinical correlation as findings may be due to patient's recent vomiting episodes. 2. Mild bilateral hydronephrosis right greater than left. No nephrolithiasis or ureteral stones. Subtle patchy appearance to the renal cortex bilaterally with subcapsular low-attenuation over the upper pole right kidney. Findings may be due to infection. Recommend clinical correlation and follow-up imaging as clinically indicated. 3. Subtle indeterminate low-attenuation in the region of the pancreatic head without significant change. Consider MRI for further evaluation to exclude pancreatic mass. 4. Aortic atherosclerosis. Aortic Atherosclerosis (ICD10-I70.0). Electronically Signed: By: Marin Olp M.D. On: 08/15/2020 10:35    ASSESSMENT AND PLAN: 40 yo female   1. AKI, improving 2. B/l hydronephrosis, status post ureteral stent placement 3. Severe anemia, hypoproductive, normocytic  4. Folic deficiency  5.  Polysubstance abuse, including IV heroine 6.  History of cervical cancer, status post chemoradiation with cisplatin 10 years ago at [facility].  Moderate amount of pneumomediastinum, without evidence of esophageal perforation or chest injury  8. ? Pancreatic head mass, stable since 2019, unlikely malignancy  9. UTI 10. Leukocytosis likely reactive secondary to UTI  Recommendations: -Her anemia has been chronic, at least from 2019 (Hg 11.8), getting worse since 04/2020 (Hg 10.5- 7.7), likely anemia of chronic disease, although her renal failure appears to be acute. She does have folic deficiency, and is on supplement now -Multiple myeloma panel, light chains, UPEP are pending.  Bone survey negative.  We will follow-up on  these results.  We discussed with the patient that she does not need to stay in the hospital while awaiting these results and can be discharged if otherwise stable.  We can arrange for outpatient follow-up in our office to discuss the results. -May consider bone marrow biopsy as an outpatient if she has persistent anemia.  I briefly discussed the procedure for bone marrow biopsy with the patient.  We will discuss further at a later date if needed.   LOS: 3 days   Mikey Bussing, DNP, AGPCNP-BC, AOCNP 08/17/20   Addendum  I have seen the patient. I agree with the assessment and and plan and have edited the notes.   Lab reviewed, anemia overall stable, renal function improving. She is clinically stable. Serum Kappa and Lamda free chain levels are both elevated with normal ratio, SPEP and UPEP results are still pending. Given the nonspecific elevation of light chains, my suspicion for MM is not very high. But if she has persistent moderate to sever anemia,will consider bone marrow biopsy as outpt. If she will be discharged home in next few days, I will set up his f/u with [place] in a few weeks.  Truitt Merle  08/17/2020

## 2020-08-17 NOTE — Progress Notes (Addendum)
PROGRESS NOTE    Marie Brewer  JKK:[id number] DOB: 1980/08/20 DOA: 08/14/2020 PCP: Pcp, No   Brief Narrative: 40 year old with past medical history significant for polysubstance abuse, remote history of uterine and cervical cancer s/p surgery abd radiation,  anemia.  She reports that she stopped using heroin 2 months ago.  She report nausea and vomiting for the past month.  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  Evaluation in the ED patient was found to be severely anemic with a hemoglobin of 5, creatinine of 10.  Patient received IV fluids, antiemetic triad was called for admission.  During evaluation for acute renal failure CT renal protocol showed pneumomediastinum, mild bilateral hydronephrosis right greater than left.  Subtle patchy appearance to the renal cortex bilaterally with subcapsular low-attenuation over the upper pole right kidney.  Finding may be due to infection.    Subsequently CT chest and abdomen was obtained which showed moderate amount of pneumomediastinum throughout the chest.  No definitive focal esophageal injury identified.  Focal dependent debris's over the upper trachea which may be due to aspiration versus posttraumatic.  Mild heterogeneous low attenuation over the pancreatic head stable compared to previous exam.  Pancreatic mass is possible.  Recommend MRI on elective basis.   CVTS was consulted and recommended patient to be n.p.o. and started on IV Zosyn (started Meropenem due to penicillin allergies).  Urology was consulted and patient underwent retrograde pyelogram and bilateral JJ stent placement 1/16.   Assessment & Plan:   Active Problems:   Polysubstance abuse (HCC)   ARF (acute renal failure) (HCC)   Acute renal failure (ARF) (HCC)   Anemia, unspecified   Anemia  1-Severe Anemia: Patient presents with hb 6---5.  Patient has Received 4 unit  PRBC. Hematology consulted. LHD 136 , peripheral smear: no schitocytes , haptoglobin pending, DIC panel.  Patient denies melena, hematochezia.  Started folic acid.  Hb stable at 8.4. Work up for multiple myeloma in process.  Per Dr  Burr Medico, patient has acute on chronic anemia of chronic diseases. If anemia persist, patient might need Bone Marrow.   2-Acute Renal failure;  -Prior cr per records 1.3. -Presents with Cr at 10.  -AKI: combination obstructive and pre renal.  -Renal US; showed right hydronephrosis, no left  hydronephrosis although left kidney is poorly delineated.  -CT stone; mild bilateral hydronephrosis right greater than left.  No nephrolithiasis or ureteral stone.  Subtle patchy appearance to the renal cortex bilaterally with subcapsular low-attenuation over the upper pole right kidney.  Finding may be due to infection. -Underwent  retrograde pyelogram and bilateral JJ stent placement 1/16..  -Continue with IV fluids.  -Cr trending down. 9--8--7.6  3-Pneumomediastinum; -Incidental finding on CT renal protocol. -CT chest and abdomen: Moderate amount of pneumomediastinum throughout the chest.  Not definitive focal esophageal injury 25.  No definitive etiology is to live for this pneumomediastinum. -Continue with IV meropenem.  -CVTS consulted, Appreciate Dr Roxy Manns evaluation and recommendations.  -She was allow a diet night of 1/16. Explain to patient importance of not eating.  -Discussed with Dr.  Roxy Manns 1/18, ok to start clear diet. Patient already ate yesterday. If she is not having nausea, vomiting or pain could start clear liquid today. Plan to repeat CT chest, abdomen with oral gastro-graafian contrast 1/19. If CT does not shows sign of leak, more air or more fluid around esophagus we could advance  diet as tolerated, and not follow up is needed. Now if patient develops pain, fever or worse CT scan results will need to contact Dr Roxy Manns.    4-Abnormal attenuation pancreas on CT;   -Patient will need MRI to rule out pancreatic Mass.   5-Hyponatremia; continue with IV fluids. Improved.  Hypomagnesemia; received IV magnesium. Repeat labs in am.   6-Hypokalemia; replete IV.   7-UTI; continue with IV meropenem.  Urine culture growing strep agalactiae.   Left LE wound; continue with wound care recommendation.      Estimated body mass index is 27.36 kg/m as calculated from the following:   Height as of 05/09/20: _0  (1.676 m).   Weight as of this encounter: 76.9 kg.   DVT prophylaxis: SCD Code Status: Patient wants to be Full code today.  Family Communication: care discussed with patient and mother over phone 1/17 Disposition Plan:  Status is: Inpatient  Remains inpatient appropriate because:Hemodynamically unstable   Dispo: The patient is from: Home              Anticipated d/c is to: Home              Anticipated d/c date is: 3 days              Patient currently is not medically stable to d/c.        Consultants:   Urology  Nephrology  Hematology  CVTS  Procedures:   Renal US.     Antimicrobials:    Subjective: She is more calm today. Willing to remain NPO.    Objective: Vitals:   08/16/20 2047 08/17/20 0419 08/17/20 0422 08/17/20 0938  BP: (!) 146/93  (!) 161/85 (!) 153/92  Pulse: 69  66 80  Resp: _1 Temp: 98.1 F (36.7 C)  98.8 F (37.1 C) 98.2 F (36.8 C)  TempSrc: Oral  Oral Oral  SpO2: 99%  100% 98%  Weight:  76.9 kg      Intake/Output Summary (Last 24 hours) at 08/17/2020 1113 Last data filed at 08/17/2020 0800 Gross per 24 hour  Intake 961.97 ml  Output 1050 ml  Net -88.03 ml   Filed Weights   08/17/20 0419  Weight: 76.9 kg    Examination:  General exam: NAD Respiratory system: CTA Cardiovascular system: S 1, S 2 RRR Gastrointestinal system: BS present, soft, nt Central nervous system: ALert Extremities: symmetric power.     Data Reviewed: I have personally reviewed following  labs and imaging studies  CBC: Recent Labs  Lab 08/14/20 0318 08/14/20 0632 08/15/20 0300 08/15/20 1241 08/15/20 1451 08/15/20 2149 08/16/20 0328 08/17/20 0856  WBC 15.7* 16.7* 13.9*  --   --   --  7.9 8.6  NEUTROABS  --  14.5*  --   --   --   --  7.1  --   HGB 6.4* 4.6* 5.3* 7.1* 6.4* 8.9* 7.4* 8.4*  HCT 20.6* 15.0* 17.2* 21.0* 19.2* 27.8* 22.9* 24.9*  MCV 90.7 91.5 90.5  --   --   --  89.5 87.4  PLT 348 343 274  --  274  --  277 659   Basic Metabolic Panel: Recent Labs  Lab 08/14/20 0318 08/14/20 0632 08/14/20 1432 08/15/20 0300 08/15/20 1241 08/16/20 0328 08/17/20 0856  NA 131*  --   --  132* 132* 132* 135  K 3.1*  --   --  3.0* 4.1 2.9* 3.1*  CL 87*  --   --  92* 95* 97* 99  CO2 24  --   --  22  --  19* 19*  GLUCOSE 102*  --   --  96 81 173* 77  BUN 114*  --   --  106* 124* 97* 93*  CREATININE 10.73*  --  8.19* 10.02* 9.00* 8.64* 7.65*  CALCIUM 7.3*  --   --  7.1*  --  8.2* 8.4*  MG  --  1.2*  --   --   --  1.7  --   PHOS  --   --   --   --   --  5.9*  --    GFR: Estimated Creatinine Clearance: 10.2 mL/min (A) (by C-G formula based on SCr of 7.65 mg/dL (H)). Liver Function Tests: Recent Labs  Lab 08/14/20 0318 08/15/20 0300 08/16/20 0328  AST 11* 9*  --   ALT 9 8  --   ALKPHOS 111 91  --   BILITOT 0.8 0.6  --   PROT 8.8* 7.5  --   ALBUMIN 2.6* 2.3* 2.2*   Recent Labs  Lab 08/14/20 0318  LIPASE 82*   No results for input(s): AMMONIA in the last 168 hours. Coagulation Profile: Recent Labs  Lab 08/15/20 1451  INR 1.2   Cardiac Enzymes: No results for input(s): CKTOTAL, CKMB, CKMBINDEX, TROPONINI in the last 168 hours. BNP (last 3 results) No results for input(s): PROBNP in the last 8760 hours. HbA1C: No results for input(s): HGBA1C in the last 72 hours. CBG: No results for input(s): GLUCAP in the last 168 hours. Lipid Profile: No results for input(s): CHOL, HDL, LDLCALC, TRIG, CHOLHDL, LDLDIRECT in the last 72 hours. Thyroid Function  Tests: No results for input(s): TSH, T4TOTAL, FREET4, T3FREE, THYROIDAB in the last 72 hours. Anemia Panel: Recent Labs    08/15/20 0300 08/15/20 1451  VITAMINB12 537  --   FOLATE 3.6*  --   FERRITIN 495*  --   TIBC 203*  --   IRON 58  --   RETICCTPCT 2.3 2.3   Sepsis Labs: Recent Labs  Lab 08/14/20 0635 08/14/20 1021  LATICACIDVEN 1.4 1.1    Recent Results (from the past 240 hour(s))  Urine culture     Status: Abnormal   Collection Time: 08/14/20  5:20 AM   Specimen: Urine, Random  Result Value Ref Range Status   Specimen Description URINE, RANDOM  Final   Special Requests NONE  Final   Culture (A)  Final    >=100,000 COLONIES/mL GROUP B STREP(S.AGALACTIAE)ISOLATED TESTING AGAINST S. AGALACTIAE NOT ROUTINELY PERFORMED DUE TO PREDICTABILITY OF AMP/PEN/VAN SUSCEPTIBILITY. Performed at [facility], [place] [address], [place], [place] [address]    Report Status 08/15/2020 FINAL  Final  Resp Panel by RT-PCR (Flu A&B, Covid) Nasopharyngeal Swab     Status: None   Collection Time: 08/14/20  6:00 AM   Specimen: Nasopharyngeal Swab; Nasopharyngeal(NP) swabs in vial transport medium  Result Value Ref Range Status   SARS Coronavirus 2 by RT PCR NEGATIVE NEGATIVE Final    Comment: (NOTE) SARS-CoV-2 target nucleic acids are NOT DETECTED.  The SARS-CoV-2 RNA is generally detectable in upper respiratory specimens during the acute phase of infection. The lowest concentration of SARS-CoV-2 viral copies this assay can detect is 138 copies/mL. A negative result does not preclude SARS-Cov-2 infection and should not be used as the sole basis for treatment or other patient management decisions. A negative result may occur with  improper specimen collection/handling, submission of specimen  other than nasopharyngeal swab, presence of viral mutation(s) within the areas targeted by this assay, and inadequate number of viral copies(<138 copies/mL). A negative result must be combined  with clinical observations, patient history, and epidemiological information. The expected result is Negative.  Fact Sheet for Patients:  EntrepreneurPulse.com.au  Fact Sheet for Healthcare Providers:  IncredibleEmployment.be  This test is no t yet approved or cleared by the [place] FDA and  has been authorized for detection and/or diagnosis of SARS-CoV-2 by FDA under an Emergency Use Authorization (EUA). This EUA will remain  in effect (meaning this test can be used) for the duration of the COVID-19 declaration under Section 564(b)(1) of the Act, 21 U.S.C.section 360bbb-3(b)(1), unless the authorization is terminated  or revoked sooner.       Influenza A by PCR NEGATIVE NEGATIVE Final   Influenza B by PCR NEGATIVE NEGATIVE Final    Comment: (NOTE) The Xpert Xpress SARS-CoV-2/FLU/RSV plus assay is intended as an aid in the diagnosis of influenza from Nasopharyngeal swab specimens and should not be used as a sole basis for treatment. Nasal washings and aspirates are unacceptable for Xpert Xpress SARS-CoV-2/FLU/RSV testing.  Fact Sheet for Patients: EntrepreneurPulse.com.au  Fact Sheet for Healthcare Providers: IncredibleEmployment.be  This test is not yet approved or cleared by the [place] FDA and has been authorized for detection and/or diagnosis of SARS-CoV-2 by FDA under an Emergency Use Authorization (EUA). This EUA will remain in effect (meaning this test can be used) for the duration of the COVID-19 declaration under Section 564(b)(1) of the Act, 21 U.S.C. section 360bbb-3(b)(1), unless the authorization is terminated or revoked.  Performed at [facility], [place] [address], [place], [place] [address]   Blood culture (routine x 2)     Status: None (Preliminary result)   Collection Time: 08/14/20  6:32 AM   Specimen: BLOOD  Result Value Ref Range Status   Specimen Description  BLOOD SITE NOT SPECIFIED  Final   Special Requests   Final    BOTTLES DRAWN AEROBIC AND ANAEROBIC Blood Culture adequate volume   Culture   Final    NO GROWTH 3 DAYS Performed at [facility], [address], [place], [place] [address]    Report Status PENDING  Incomplete  Blood culture (routine x 2)     Status: None (Preliminary result)   Collection Time: 08/14/20 10:10 AM   Specimen: BLOOD LEFT HAND  Result Value Ref Range Status   Specimen Description BLOOD LEFT HAND  Final   Special Requests   Final    BOTTLES DRAWN AEROBIC AND ANAEROBIC Blood Culture adequate volume   Culture   Final    NO GROWTH 3 DAYS Performed at [facility], [place] [address] [place], [place] [address]    Report Status PENDING  Incomplete         Radiology Studies: CT ABDOMEN WO CONTRAST  Result Date: 08/15/2020 CLINICAL DATA:  Possible esophageal perforation. Pneumomediastinum on recent abdominal CT. Nausea and vomiting. Acute on chronic anemia. History of heroin abuse. EXAM: CT CHEST AND ABDOMEN WITHOUT CONTRAST TECHNIQUE: Multidetector CT imaging of the chest and abdomen was performed following the standard protocol without intravenous contrast. Patient was given 50 mL Omnipaque 350 dilute and 12 ounces of water orally immediately before scanning. COMPARISON:  Noncontrast abdominopelvic CT earlier today as well as previous abdominal CT 11/13/2017 FINDINGS: CT CHEST FINDINGS WITHOUT CONTRAST Cardiovascular: Mild cardiomegaly. Thoracic aorta is otherwise unremarkable. Remaining vascular structures are unremarkable.  Mediastinum/Nodes: Evidence of moderate pneumomediastinum throughout the chest. As this is visualized on the most superior image at the neck base extends through the chest to the diaphragm. Minimal cervical wall thickening throughout the esophagus. Contrast is present throughout the course of the esophagus without evidence of extravasation of contrast. No definite focal esophageal injury  is identified. No mediastinal or hilar adenopathy. Remaining mediastinal structures are unremarkable. Lungs/Pleura: Lungs are well inflated without focal airspace consolidation or effusion. No pneumothorax. Small thin-walled cysts over the medial right apex. Focal debris over the dependent portion of the upper trachea. Airways are normal. Musculoskeletal: No acute findings. CT ABDOMEN FINDINGS WITHOUT CONTRAST Hepatobiliary: Previous cholecystectomy. Liver and biliary tree are unremarkable. Pancreas: No change in recently seen somewhat heterogeneous low-attenuation over the pancreatic head stable compared to the previous exam from 2019. No perinephric inflammation or fluid. Spleen: Normal. Adrenals/Urinary Tract: Adrenal glands are normal. Kidneys are normal in size with mild bilateral hydronephrosis unchanged. Subcapsular low-attenuation over the upper pole right kidney unchanged. Minimal left perinephric fluid unchanged. No renal stones. Visualized ureters are unremarkable. Stomach/Bowel: Contrast within the stomach which is otherwise unremarkable. Visualized bowel is unremarkable. Vascular/Lymphatic: Abdominal aorta is normal caliber. Minimal calcified plaque over the proximal abdominal aorta. No adenopathy. Other: No other changes. Musculoskeletal: No acute findings. IMPRESSION: 1. Moderate amount of pneumomediastinum throughout the chest. No definite focal esophageal injury identified. No definite etiology visualized for this pneumomediastinum. 2. Subcapsular low-density collection over the upper pole right kidney with bilateral hydronephrosis as these findings are stable. Findings could be seen due to infection. Recommend clinical correlation. 3. Moderate focal dependent debris over the upper trachea which may be due to aspiration versus posttraumatic. 4. Mild heterogeneous low-attenuation over the pancreatic head stable compared to the previous exam from 2019. Pancreatic mass is possible. Continue to  recommend further evaluation on elective basis with MRI. 5. Aortic atherosclerosis. Aortic Atherosclerosis (ICD10-I70.0). Electronically Signed   By: Marin Olp M.D.   On: 08/15/2020 12:17   CT CHEST WO CONTRAST  Result Date: 08/15/2020 CLINICAL DATA:  Possible esophageal perforation. Pneumomediastinum on recent abdominal CT. Nausea and vomiting. Acute on chronic anemia. History of heroin abuse. EXAM: CT CHEST AND ABDOMEN WITHOUT CONTRAST TECHNIQUE: Multidetector CT imaging of the chest and abdomen was performed following the standard protocol without intravenous contrast. Patient was given 50 mL Omnipaque 350 dilute and 12 ounces of water orally immediately before scanning. COMPARISON:  Noncontrast abdominopelvic CT earlier today as well as previous abdominal CT 11/13/2017 FINDINGS: CT CHEST FINDINGS WITHOUT CONTRAST Cardiovascular: Mild cardiomegaly. Thoracic aorta is otherwise unremarkable. Remaining vascular structures are unremarkable. Mediastinum/Nodes: Evidence of moderate pneumomediastinum throughout the chest. As this is visualized on the most superior image at the neck base extends through the chest to the diaphragm. Minimal cervical wall thickening throughout the esophagus. Contrast is present throughout the course of the esophagus without evidence of extravasation of contrast. No definite focal esophageal injury is identified. No mediastinal or hilar adenopathy. Remaining mediastinal structures are unremarkable. Lungs/Pleura: Lungs are well inflated without focal airspace consolidation or effusion. No pneumothorax. Small thin-walled cysts over the medial right apex. Focal debris over the dependent portion of the upper trachea. Airways are normal. Musculoskeletal: No acute findings. CT ABDOMEN FINDINGS WITHOUT CONTRAST Hepatobiliary: Previous cholecystectomy. Liver and biliary tree are unremarkable. Pancreas: No change in recently seen somewhat heterogeneous low-attenuation over the pancreatic head  stable compared to the previous exam from 2019. No perinephric inflammation or fluid. Spleen: Normal. Adrenals/Urinary Tract: Adrenal glands are  normal. Kidneys are normal in size with mild bilateral hydronephrosis unchanged. Subcapsular low-attenuation over the upper pole right kidney unchanged. Minimal left perinephric fluid unchanged. No renal stones. Visualized ureters are unremarkable. Stomach/Bowel: Contrast within the stomach which is otherwise unremarkable. Visualized bowel is unremarkable. Vascular/Lymphatic: Abdominal aorta is normal caliber. Minimal calcified plaque over the proximal abdominal aorta. No adenopathy. Other: No other changes. Musculoskeletal: No acute findings. IMPRESSION: 1. Moderate amount of pneumomediastinum throughout the chest. No definite focal esophageal injury identified. No definite etiology visualized for this pneumomediastinum. 2. Subcapsular low-density collection over the upper pole right kidney with bilateral hydronephrosis as these findings are stable. Findings could be seen due to infection. Recommend clinical correlation. 3. Moderate focal dependent debris over the upper trachea which may be due to aspiration versus posttraumatic. 4. Mild heterogeneous low-attenuation over the pancreatic head stable compared to the previous exam from 2019. Pancreatic mass is possible. Continue to recommend further evaluation on elective basis with MRI. 5. Aortic atherosclerosis. Aortic Atherosclerosis (ICD10-I70.0). Electronically Signed   By: Marin Olp M.D.   On: 08/15/2020 12:17   DG Retrograde Pyelogram  Result Date: 08/15/2020 CLINICAL DATA:  40 year old with bilateral hydronephrosis. EXAM: RETROGRADE PYELOGRAM COMPARISON:  CT abdomen 08/15/2018 FLUOROSCOPY TIME:  56.9 seconds FINDINGS: Right retrograde exam demonstrates opacification of the right ureter with moderate dilatation of the right renal pelvis and right renal calices. Evidence for contrast in the duodenum or small  bowel loops in the right upper abdomen. Right double-J ureter stent was placed. Opacification of the left ureter. Incomplete evaluation of the proximal left ureter. Moderate dilatation of left renal calices. Placement of left ureter stent. IMPRESSION: Bilateral hydronephrosis and placement of bilateral ureter stents. Electronically Signed   By: Markus Daft M.D.   On: 08/15/2020 13:53   DG Bone Survey Met  Result Date: 08/16/2020 CLINICAL DATA:  Anemia.  History of cervical cancer. EXAM: METASTATIC BONE SURVEY COMPARISON:  None. FINDINGS: No lytic or sclerotic lesion is seen involving the visualized skeleton, including the spine, pelvis, rib cage or extremities. Mild degenerative disc disease is noted at C5-6 and C6-7. IMPRESSION: No significant abnormality seen involving the visualized skeleton. Electronically Signed   By: Marijo Conception M.D.   On: 08/16/2020 08:11        Scheduled Meds: . Chlorhexidine Gluconate Cloth  6 each Topical Q0600  . folic acid  1 mg Intravenous Daily  . pantoprazole (PROTONIX) IV  40 mg Intravenous Q12H  . sodium chloride flush  3 mL Intravenous Q12H   Continuous Infusions: . sodium chloride 50 mL/hr at 08/17/20 0652  . meropenem (MERREM) IV Stopped (08/16/20 2131)     LOS: 3 days    Time spent: 35 minutes.     Elmarie Shiley, MD [facility]   If 7PM-7AM, please contact night-coverage [email or web address]  08/17/2020, 11:13 AM

## 2020-08-17 NOTE — Consult Note (Signed)
Consultation Note Date: 08/17/2020   Patient Name: Marie Brewer  DOB: 03-28-1981  MRN: [id number]  Age / Sex: 40 y.o., female  PCP: Pcp, No Referring Physician: Elmarie Shiley, MD  Reason for Consultation: Establishing goals of care and Psychosocial/spiritual support  HPI/Patient Profile: 40 y.o. female  08/14/2020 with past medical history significant for polysubstance abuse, remote history of uterine cervical cancer anemia.  H/o substance abuse per admission history patient reports  stopped using heroin 2 months ago.  To ER  with c/o nausea and vomiting for the past mont  Admitted for treatment and stabilization.   In the emergency room she was found to be severely anemic with a hemoglobin of 6.4 and dehydrated and was found to have acute renal failure with creatinine of 10.73.  Resolving nonoliguric AKI - Likely obstructive from b/l ureteral strictures and s/p b/l stents placed 1/16.  Will need ongoing f/u    Patient has had multiple hospitalizations on the past six months  Patient faces treatment option decisions, advanced directive decisions and anticipatory care needs.   Clinical Assessment and Goals of Care:   This NP Wadie Lessen reviewed medical records, received report from team, assessed the patient and then meet at the patient's bedside  to discuss diagnosis, prognosis, GOC, disposition and options.   Concept of Palliative Care was introduced as specialized medical care for people and their families living with serious illness.  If focuses on providing relief from the symptoms and stress of a serious illness.  The goal is to improve quality of life for both the patient and the family.  Values and goals of care important to patient and family were attempted to be elicited.  Created space and opportunity for patient  to explore thoughts and feelings regarding current medical situation.     Patient became agitated quite quickly as we broached the subject regarding healthcare.    She declined any further conversation but agrees to meet with me again tomorrow.  "Maybe I'll  feel like talking about it then"   Plan is to f/u in the morning and check in with [facility]   Patient  encouraged to call with questions or concerns.     PMT will continue to support holistically.    No documented healthcare power of attorney or advanced care planning documents.  Encourage patient to consider completion of these documents while she is here in the hospital.       SUMMARY OF RECOMMENDATIONS    Code Status/Advance Care Planning:  Full code   Palliative Prophylaxis:   Delirium Protocol and Frequent Pain Assessment  Additional Recommendations (Limitations, Scope, Preferences):  Full Scope Treatment  Psycho-social/Spiritual:   Desire for further Chaplaincy support:no   Prognosis:   > 12 months  Discharge Planning: Home with Home Health      Primary Diagnoses: Present on Admission:  ARF (acute renal failure) ([place])  Polysubstance abuse ([place])  Acute renal failure (ARF) ([place])   I have reviewed the medical record, interviewed the patient and family, and  examined the patient. The following aspects are pertinent.  Past Medical History:  Diagnosis Date   History of cervical cancer    Migraine headache    Polysubstance abuse ([place]) 05/05/2020   Social History   Socioeconomic History   Marital status: Divorced    Spouse name: Not on file   Number of children: Not on file   Years of education: Not on file   Highest education level: Not on file  Occupational History   Not on file  Tobacco Use   Smoking status: Current Every Day Smoker    Packs/day: 0.25    Types: Cigarettes   Smokeless tobacco: Never Used  Vaping Use   Vaping Use: Never used  Substance and Sexual Activity   Alcohol use: No   Drug use: Not Currently    Types: Heroin    Comment:  Stopped 2 months ago   Sexual activity: Not on file  Other Topics Concern   Not on file  Social History Narrative   Not on file   Social Determinants of Health   Financial Resource Strain: Not on file  Food Insecurity: Not on file  Transportation Needs: Not on file  Physical Activity: Not on file  Stress: Not on file  Social Connections: Not on file   Family History  Problem Relation Age of Onset   Autism spectrum disorder Other    Scheduled Meds:  Chlorhexidine Gluconate Cloth  6 each Topical G3151   folic acid  1 mg Intravenous Daily   pantoprazole (PROTONIX) IV  40 mg Intravenous Q12H   potassium chloride  40 mEq Oral Once   sodium chloride flush  3 mL Intravenous Q12H   Continuous Infusions:  sodium chloride 50 mL/hr at 08/17/20 0652   meropenem (MERREM) IV Stopped (08/16/20 2131)   PRN Meds:.acetaminophen, diphenhydrAMINE, iohexol, ondansetron (ZOFRAN) IV Medications Prior to Admission:  Prior to Admission medications   Not on File   Allergies  Allergen Reactions   Amoxicillin-Pot Clavulanate Hives   Imitrex [Sumatriptan Base] Other (See Comments)    Heart races   Cephalexin Rash   Review of Systems  Constitutional:       " I'm fine"  All other systems reviewed and are negative.   Physical Exam Cardiovascular:     Rate and Rhythm: Normal rate.  Skin:    General: Skin is warm and dry.  Neurological:     Mental Status: She is alert and oriented to person, place, and time.  Psychiatric:        Behavior: Behavior is agitated.     Vital Signs: BP (!) 153/92 (BP Location: Left Arm)    Pulse 80    Temp 98.2 F (36.8 C) (Oral)    Resp 18    Wt 76.9 kg    SpO2 98%    BMI 27.36 kg/m  Pain Scale: 0-10 POSS *See Group Information*: 1-Acceptable,Awake and alert Pain Score: 0-No pain   SpO2: SpO2: 98 % O2 Device:SpO2: 98 % O2 Flow Rate: .   IO: Intake/output summary:   Intake/Output Summary (Last 24 hours) at 08/17/2020 1610 Last data  filed at 08/17/2020 1300 Gross per 24 hour  Intake 1201.97 ml  Output 800 ml  Net 401.97 ml    LBM: Last BM Date: 08/15/20 Baseline Weight: Weight: 76.9 kg Most recent weight: Weight: 76.9 kg     Palliative Assessment/Data:   Discussed with Dr. Tyrell Antonio  Time In: 1100 Time Out: 1200 Time Total: 60 minutes   Greater  than 50%  of this time was spent counseling and coordinating care related to the above assessment and plan.  Signed by: Wadie Lessen, NP   Please contact Palliative Medicine Team phone at [phone] for questions and concerns.  For individual provider: See Shea Evans

## 2020-08-17 NOTE — Progress Notes (Signed)
[facility] Progress Note    Assessment/ Plan:   1. Resolving nonoliguric AKI - Likely obstructive from b/l ureteral strictures and s/p b/l stents placed 1/16, now uOP picking up and SCr downttrending. C3 and C4 WNL, ASO elevate, GBM neg.   1. Resolving slowly 2. No further intpatient renal issues, given high census will s/o for now, but please call w any ?s or concerns 3. WIll arrange [facility] f/u  2. Acute / chronic anemia - per primary 3. Pneumomediastinum per PCP and TCTS 4. UTI - per URO and TRH 5. Hx/o cervica lCa   Subjective:    Hungry, wants to eat  UOP 1L  SCr and BUN improving.  K 3.1   Objective:   BP (!) 153/92 (BP Location: Left Arm)   Pulse 80   Temp 98.2 F (36.8 C) (Oral)   Resp 18   Wt 76.9 kg   SpO2 98%   BMI 27.36 kg/m   Intake/Output Summary (Last 24 hours) at 08/17/2020 1131 Last data filed at 08/17/2020 0800 Gross per 24 hour  Intake 961.97 ml  Output 1050 ml  Net -88.03 ml   Weight change:   Physical Exam: Genalert, chron ill slightly on appearance No jvd or bruits, flat neck veins Chest clear bilatto bases RRR no MRG Abd soft ntnd no mass or ascites +bs Extbrawny skin changes bilat LE's, no sig edema Neuro is alert, Ox 3 , nf, no asterixis  Imaging: CT ABDOMEN WO CONTRAST  Result Date: 08/15/2020 CLINICAL DATA:  Possible esophageal perforation. Pneumomediastinum on recent abdominal CT. Nausea and vomiting. Acute on chronic anemia. History of heroin abuse. EXAM: CT CHEST AND ABDOMEN WITHOUT CONTRAST TECHNIQUE: Multidetector CT imaging of the chest and abdomen was performed following the standard protocol without intravenous contrast. Patient was given 50 mL Omnipaque 350 dilute and 12 ounces of water orally immediately before scanning. COMPARISON:  Noncontrast abdominopelvic CT earlier today as well as previous abdominal CT 11/13/2017 FINDINGS: CT CHEST FINDINGS WITHOUT CONTRAST Cardiovascular: Mild cardiomegaly. Thoracic  aorta is otherwise unremarkable. Remaining vascular structures are unremarkable. Mediastinum/Nodes: Evidence of moderate pneumomediastinum throughout the chest. As this is visualized on the most superior image at the neck base extends through the chest to the diaphragm. Minimal cervical wall thickening throughout the esophagus. Contrast is present throughout the course of the esophagus without evidence of extravasation of contrast. No definite focal esophageal injury is identified. No mediastinal or hilar adenopathy. Remaining mediastinal structures are unremarkable. Lungs/Pleura: Lungs are well inflated without focal airspace consolidation or effusion. No pneumothorax. Small thin-walled cysts over the medial right apex. Focal debris over the dependent portion of the upper trachea. Airways are normal. Musculoskeletal: No acute findings. CT ABDOMEN FINDINGS WITHOUT CONTRAST Hepatobiliary: Previous cholecystectomy. Liver and biliary tree are unremarkable. Pancreas: No change in recently seen somewhat heterogeneous low-attenuation over the pancreatic head stable compared to the previous exam from 2019. No perinephric inflammation or fluid. Spleen: Normal. Adrenals/Urinary Tract: Adrenal glands are normal. Kidneys are normal in size with mild bilateral hydronephrosis unchanged. Subcapsular low-attenuation over the upper pole right kidney unchanged. Minimal left perinephric fluid unchanged. No renal stones. Visualized ureters are unremarkable. Stomach/Bowel: Contrast within the stomach which is otherwise unremarkable. Visualized bowel is unremarkable. Vascular/Lymphatic: Abdominal aorta is normal caliber. Minimal calcified plaque over the proximal abdominal aorta. No adenopathy. Other: No other changes. Musculoskeletal: No acute findings. IMPRESSION: 1. Moderate amount of pneumomediastinum throughout the chest. No definite focal esophageal injury identified. No definite etiology visualized for this pneumomediastinum.  2.  Subcapsular low-density collection over the upper pole right kidney with bilateral hydronephrosis as these findings are stable. Findings could be seen due to infection. Recommend clinical correlation. 3. Moderate focal dependent debris over the upper trachea which may be due to aspiration versus posttraumatic. 4. Mild heterogeneous low-attenuation over the pancreatic head stable compared to the previous exam from 2019. Pancreatic mass is possible. Continue to recommend further evaluation on elective basis with MRI. 5. Aortic atherosclerosis. Aortic Atherosclerosis (ICD10-I70.0). Electronically Signed   By: Marin Olp M.D.   On: 08/15/2020 12:17   CT CHEST WO CONTRAST  Result Date: 08/15/2020 CLINICAL DATA:  Possible esophageal perforation. Pneumomediastinum on recent abdominal CT. Nausea and vomiting. Acute on chronic anemia. History of heroin abuse. EXAM: CT CHEST AND ABDOMEN WITHOUT CONTRAST TECHNIQUE: Multidetector CT imaging of the chest and abdomen was performed following the standard protocol without intravenous contrast. Patient was given 50 mL Omnipaque 350 dilute and 12 ounces of water orally immediately before scanning. COMPARISON:  Noncontrast abdominopelvic CT earlier today as well as previous abdominal CT 11/13/2017 FINDINGS: CT CHEST FINDINGS WITHOUT CONTRAST Cardiovascular: Mild cardiomegaly. Thoracic aorta is otherwise unremarkable. Remaining vascular structures are unremarkable. Mediastinum/Nodes: Evidence of moderate pneumomediastinum throughout the chest. As this is visualized on the most superior image at the neck base extends through the chest to the diaphragm. Minimal cervical wall thickening throughout the esophagus. Contrast is present throughout the course of the esophagus without evidence of extravasation of contrast. No definite focal esophageal injury is identified. No mediastinal or hilar adenopathy. Remaining mediastinal structures are unremarkable. Lungs/Pleura: Lungs are well  inflated without focal airspace consolidation or effusion. No pneumothorax. Small thin-walled cysts over the medial right apex. Focal debris over the dependent portion of the upper trachea. Airways are normal. Musculoskeletal: No acute findings. CT ABDOMEN FINDINGS WITHOUT CONTRAST Hepatobiliary: Previous cholecystectomy. Liver and biliary tree are unremarkable. Pancreas: No change in recently seen somewhat heterogeneous low-attenuation over the pancreatic head stable compared to the previous exam from 2019. No perinephric inflammation or fluid. Spleen: Normal. Adrenals/Urinary Tract: Adrenal glands are normal. Kidneys are normal in size with mild bilateral hydronephrosis unchanged. Subcapsular low-attenuation over the upper pole right kidney unchanged. Minimal left perinephric fluid unchanged. No renal stones. Visualized ureters are unremarkable. Stomach/Bowel: Contrast within the stomach which is otherwise unremarkable. Visualized bowel is unremarkable. Vascular/Lymphatic: Abdominal aorta is normal caliber. Minimal calcified plaque over the proximal abdominal aorta. No adenopathy. Other: No other changes. Musculoskeletal: No acute findings. IMPRESSION: 1. Moderate amount of pneumomediastinum throughout the chest. No definite focal esophageal injury identified. No definite etiology visualized for this pneumomediastinum. 2. Subcapsular low-density collection over the upper pole right kidney with bilateral hydronephrosis as these findings are stable. Findings could be seen due to infection. Recommend clinical correlation. 3. Moderate focal dependent debris over the upper trachea which may be due to aspiration versus posttraumatic. 4. Mild heterogeneous low-attenuation over the pancreatic head stable compared to the previous exam from 2019. Pancreatic mass is possible. Continue to recommend further evaluation on elective basis with MRI. 5. Aortic atherosclerosis. Aortic Atherosclerosis (ICD10-I70.0). Electronically  Signed   By: Marin Olp M.D.   On: 08/15/2020 12:17   DG Retrograde Pyelogram  Result Date: 08/15/2020 CLINICAL DATA:  40 year old with bilateral hydronephrosis. EXAM: RETROGRADE PYELOGRAM COMPARISON:  CT abdomen 08/15/2018 FLUOROSCOPY TIME:  56.9 seconds FINDINGS: Right retrograde exam demonstrates opacification of the right ureter with moderate dilatation of the right renal pelvis and right renal calices. Evidence for contrast in the duodenum  or small bowel loops in the right upper abdomen. Right double-J ureter stent was placed. Opacification of the left ureter. Incomplete evaluation of the proximal left ureter. Moderate dilatation of left renal calices. Placement of left ureter stent. IMPRESSION: Bilateral hydronephrosis and placement of bilateral ureter stents. Electronically Signed   By: Markus Daft M.D.   On: 08/15/2020 13:53   DG Bone Survey Met  Result Date: 08/16/2020 CLINICAL DATA:  Anemia.  History of cervical cancer. EXAM: METASTATIC BONE SURVEY COMPARISON:  None. FINDINGS: No lytic or sclerotic lesion is seen involving the visualized skeleton, including the spine, pelvis, rib cage or extremities. Mild degenerative disc disease is noted at C5-6 and C6-7. IMPRESSION: No significant abnormality seen involving the visualized skeleton. Electronically Signed   By: Marijo Conception M.D.   On: 08/16/2020 08:11    Labs: BMET Recent Labs  Lab 08/14/20 0318 08/14/20 1432 08/15/20 0300 08/15/20 1241 08/16/20 0328 08/17/20 0856  NA 131*  --  132* 132* 132* 135  K 3.1*  --  3.0* 4.1 2.9* 3.1*  CL 87*  --  92* 95* 97* 99  CO2 24  --  22  --  19* 19*  GLUCOSE 102*  --  96 81 173* 77  BUN 114*  --  106* 124* 97* 93*  CREATININE 10.73* 8.19* 10.02* 9.00* 8.64* 7.65*  CALCIUM 7.3*  --  7.1*  --  8.2* 8.4*  PHOS  --   --   --   --  5.9*  --    CBC Recent Labs  Lab 08/14/20 0632 08/15/20 0300 08/15/20 1241 08/15/20 1451 08/15/20 2149 08/16/20 0328 08/17/20 0856  WBC 16.7* 13.9*  --    --   --  7.9 8.6  NEUTROABS 14.5*  --   --   --   --  7.1  --   HGB 4.6* 5.3*   < > 6.4* 8.9* 7.4* 8.4*  HCT 15.0* 17.2*   < > 19.2* 27.8* 22.9* 24.9*  MCV 91.5 90.5  --   --   --  89.5 87.4  PLT 343 274  --  274  --  277 202   < > = values in this interval not displayed.    Medications:    . Chlorhexidine Gluconate Cloth  6 each Topical Q0600  . folic acid  1 mg Intravenous Daily  . pantoprazole (PROTONIX) IV  40 mg Intravenous Q12H  . sodium chloride flush  3 mL Intravenous Q12H      Rexene Agent, MD  08/17/2020, 11:31 AM

## 2020-08-17 NOTE — Anesthesia Postprocedure Evaluation (Signed)
Anesthesia Post Note  Patient: Marie Brewer  Procedure(s) Performed: CYSTOSCOPY WITH RETROGRADE PYELOGRAM/URETERAL STENT PLACEMENT (Bilateral Ureter)     Patient location during evaluation: PACU Anesthesia Type: General Level of consciousness: awake and alert Pain management: pain level controlled Vital Signs Assessment: post-procedure vital signs reviewed and stable Respiratory status: spontaneous breathing, nonlabored ventilation, respiratory function stable and patient connected to nasal cannula oxygen Cardiovascular status: blood pressure returned to baseline and stable Postop Assessment: no apparent nausea or vomiting Anesthetic complications: no   No complications documented.  Last Vitals:  Vitals:   08/16/20 2047 08/17/20 0422  BP: (!) 146/93 (!) 161/85  Pulse: 69 66  Resp: 16 16  Temp: 36.7 C 37.1 C  SpO2: 99% 100%    Last Pain:  Vitals:   08/17/20 0422  TempSrc: Oral  PainSc: 0-No pain                 Bowie Doiron S

## 2020-08-18 ENCOUNTER — Inpatient Hospital Stay (HOSPITAL_COMMUNITY): Payer: Self-pay

## 2020-08-18 DIAGNOSIS — Z7189 Other specified counseling: Secondary | ICD-10-CM

## 2020-08-18 LAB — KAPPA/LAMBDA LIGHT CHAINS
Kappa free light chain: 286.7 mg/L — ABNORMAL HIGH (ref 3.3–19.4)
Kappa, lambda light chain ratio: 1.24 (ref 0.26–1.65)
Lambda free light chains: 230.8 mg/L — ABNORMAL HIGH (ref 5.7–26.3)

## 2020-08-18 LAB — HAPTOGLOBIN: Haptoglobin: 387 mg/dL — ABNORMAL HIGH (ref 33–278)

## 2020-08-18 MED ORDER — POTASSIUM CHLORIDE 20 MEQ PO PACK
40.0000 meq | PACK | Freq: Once | ORAL | Status: AC
  Administered 2020-08-18: 40 meq via ORAL
  Filled 2020-08-18: qty 2

## 2020-08-18 MED ORDER — DIPHENHYDRAMINE HCL 50 MG/ML IJ SOLN: 25.0000 mg | Freq: Once | INTRAMUSCULAR | Status: DC | PRN

## 2020-08-18 MED ORDER — AMOXICILLIN 500 MG PO CAPS
500.0000 mg | ORAL_CAPSULE | Freq: Once | ORAL | Status: AC
  Administered 2020-08-18: 500 mg via ORAL
  Filled 2020-08-18: qty 1

## 2020-08-18 MED ORDER — AMOXICILLIN 500 MG PO CAPS
500.0000 mg | ORAL_CAPSULE | Freq: Once | ORAL | Status: DC
  Filled 2020-08-18: qty 1

## 2020-08-18 MED ORDER — EPINEPHRINE 0.3 MG/0.3ML IJ SOAJ
0.3000 mg | Freq: Once | INTRAMUSCULAR | Status: DC | PRN
  Filled 2020-08-18: qty 0.6

## 2020-08-18 NOTE — Plan of Care (Signed)
  Problem: Clinical Measurements: Goal: Cardiovascular complication will be avoided Outcome: Progressing   Problem: Elimination: Goal: Will not experience complications related to urinary retention Outcome: Progressing   Problem: Nutrition: Goal: Adequate nutrition will be maintained Outcome: Not Progressing

## 2020-08-18 NOTE — Progress Notes (Signed)
PROGRESS NOTE    Marie Brewer  GYK:[id number] DOB: 1981-03-09 DOA: 08/14/2020 PCP: Pcp, No   Brief Narrative: 39/F with history of polysubstance abuse including heroin, remote history of cervical/uterine cancer treated with surgery and radiation, chronic anemia stopped using heroin 2 months ago and started experiencing nausea and vomiting over the past month -Presented to the ED on 1/15 was noted to be severely anemic with a hemoglobin of 5 and a creatinine of 10, on further work-up she was noted to have bilateral hydronephrosis and a pneumomediastinum -Seen by T CTS, recommended conservative management and repeat CT -For AKI with hydronephrosis underwent bilateral double-J stent placement  Assessment & Plan:   Acute on chronic anemia  -Anemia panel suggestive of chronic disease, also noted to have folic acid deficiency on supplements now  -Myeloma panel pending, skeletal survey is negative  -Transfused 4 units of PRBC this admission -Appreciate hematology input, recommended outpatient follow-up, they may pursue bone marrow aspiration down the road  Acute Renal failure; bilateral hydronephrosis -Admitted with creatinine of 1.3, baseline is 10 -Imaging noted bilateral hydronephrosis, this is felt to be obstructive from bilateral ureteral strictures, prior history of pelvic XRT for uterine/cervical cancer -Appreciate urology input, underwent bilateral double-J stent placement on 1/16, urine output improving, creatinine slowly improving -BMP in a.m. -Will need urology follow-up in 4-6 weeks  Pneumomediastinum; -Incidental finding on CT renal protocol. -CT chest and abdomen: Moderate amount of pneumomediastinum throughout the chest.  Not definitive focal esophageal injury  -Started on empiric IV meropenem, bowel rest and T CTS was consulted -Repeat CT chest with oral Gastrografin contrast today does not show signs of leak, will advance to full liquid diet -Transition to oral antibiotics  tomorrow if stable  Abnormal attenuation pancreas on CT;  -Patient will need MRI down the road to rule out pancreatic Mass.   Positive hep C antibody -Follow-up with infectious disease  Hyponatremia; continue with IV fluids. Improved.   Hypomagnesemia; received IV magnesium. Repeat labs in am.   Hypokalemia; repleted  UTI; continue with IV meropenem primarily for pneumomediastinum Urine culture growing strep agalactiae.   Left LE wound; continue with wound care recommendation.   Estimated body mass index is 27.36 kg/m as calculated from the following:   Height as of 05/09/20: 5\' 6"  (1.676 m).   Weight as of this encounter: 76.9 kg.   DVT prophylaxis: SCD Code Status: Full code Family Communication: Discussed with patient in detail, no family at bedside Disposition Plan:  Status is: Inpatient  Remains inpatient appropriate because:Hemodynamically unstable   Dispo: The patient is from: Home              Anticipated d/c is to: Home              Anticipated d/c date is: Likely 2 days              Patient currently is not medically stable to d/c.    Consultants:   Urology  Nephrology  Hematology  CVTS  Procedures:   Renal US.     Antimicrobials:    Subjective: -Feels okay, tolerating liquids, anxious to eat solid food  Objective: Vitals:   08/17/20 1746 08/17/20 1935 08/18/20 0546 08/18/20 1000  BP: (!) 159/94 (!) 165/94 (!) 165/96 (!) 142/90  Pulse: 68 77 90 90  Resp: 17 20 16 16   Temp: 98.2 F (36.8 C) 97.7 F (36.5 C) 97.6 F (36.4 C) 97.8 F (36.6 C)  TempSrc:  Oral Oral Oral  SpO2:  100% 99% 100% 100%  Weight:        Intake/Output Summary (Last 24 hours) at 08/18/2020 1353 Last data filed at 08/18/2020 1015 Gross per 24 hour  Intake 2026.25 ml  Output 351 ml  Net 1675.25 ml   Filed Weights   08/17/20 0419  Weight: 76.9 kg    Examination:  General exam: Pleasant female sitting up in bed, AAOx3, no distress HEENT: No JVD CVS:  S1-S2, regular rate rhythm Lungs: Clear up the Abdomen: Soft, nontender, bowel sounds present Extremities: No edema Skin: Left leg with multiple wounds with clean base    Data Reviewed: I have personally reviewed following labs and imaging studies  CBC: Recent Labs  Lab 08/14/20 0318 08/14/20 0632 08/15/20 0300 08/15/20 1241 08/15/20 1451 08/15/20 2149 08/16/20 0328 08/17/20 0856  WBC 15.7* 16.7* 13.9*  --   --   --  7.9 8.6  NEUTROABS  --  14.5*  --   --   --   --  7.1  --   HGB 6.4* 4.6* 5.3* 7.1* 6.4* 8.9* 7.4* 8.4*  HCT 20.6* 15.0* 17.2* 21.0* 19.2* 27.8* 22.9* 24.9*  MCV 90.7 91.5 90.5  --   --   --  89.5 87.4  PLT 348 343 274  --  274  --  277 053   Basic Metabolic Panel: Recent Labs  Lab 08/14/20 0318 08/14/20 0632 08/14/20 1432 08/15/20 0300 08/15/20 1241 08/16/20 0328 08/17/20 0856  NA 131*  --   --  132* 132* 132* 135  K 3.1*  --   --  3.0* 4.1 2.9* 3.1*  CL 87*  --   --  92* 95* 97* 99  CO2 24  --   --  22  --  19* 19*  GLUCOSE 102*  --   --  96 81 173* 77  BUN 114*  --   --  106* 124* 97* 93*  CREATININE 10.73*  --  8.19* 10.02* 9.00* 8.64* 7.65*  CALCIUM 7.3*  --   --  7.1*  --  8.2* 8.4*  MG  --  1.2*  --   --   --  1.7  --   PHOS  --   --   --   --   --  5.9*  --    GFR: Estimated Creatinine Clearance: 10.2 mL/min (A) (by C-G formula based on SCr of 7.65 mg/dL (H)). Liver Function Tests: Recent Labs  Lab 08/14/20 0318 08/15/20 0300 08/16/20 0328  AST 11* 9*  --   ALT 9 8  --   ALKPHOS 111 91  --   BILITOT 0.8 0.6  --   PROT 8.8* 7.5  --   ALBUMIN 2.6* 2.3* 2.2*   Recent Labs  Lab 08/14/20 0318  LIPASE 82*   No results for input(s): AMMONIA in the last 168 hours. Coagulation Profile: Recent Labs  Lab 08/15/20 1451  INR 1.2   Cardiac Enzymes: No results for input(s): CKTOTAL, CKMB, CKMBINDEX, TROPONINI in the last 168 hours. BNP (last 3 results) No results for input(s): PROBNP in the last 8760 hours. HbA1C: No results for  input(s): HGBA1C in the last 72 hours. CBG: No results for input(s): GLUCAP in the last 168 hours. Lipid Profile: No results for input(s): CHOL, HDL, LDLCALC, TRIG, CHOLHDL, LDLDIRECT in the last 72 hours. Thyroid Function Tests: No results for input(s): TSH, T4TOTAL, FREET4, T3FREE, THYROIDAB in the last 72 hours. Anemia Panel: Recent Labs    08/15/20 1451  RETICCTPCT  2.3   Sepsis Labs: Recent Labs  Lab 08/14/20 0635 08/14/20 1021  LATICACIDVEN 1.4 1.1    Recent Results (from the past 240 hour(s))  Urine culture     Status: Abnormal   Collection Time: 08/14/20  5:20 AM   Specimen: Urine, Random  Result Value Ref Range Status   Specimen Description URINE, RANDOM  Final   Special Requests NONE  Final   Culture (A)  Final    >=100,000 COLONIES/mL GROUP B STREP(S.AGALACTIAE)ISOLATED TESTING AGAINST S. AGALACTIAE NOT ROUTINELY PERFORMED DUE TO PREDICTABILITY OF AMP/PEN/VAN SUSCEPTIBILITY. Performed at [facility], [place] [address], [place], [place] [address]    Report Status 08/15/2020 FINAL  Final  Resp Panel by RT-PCR (Flu A&B, Covid) Nasopharyngeal Swab     Status: None   Collection Time: 08/14/20  6:00 AM   Specimen: Nasopharyngeal Swab; Nasopharyngeal(NP) swabs in vial transport medium  Result Value Ref Range Status   SARS Coronavirus 2 by RT PCR NEGATIVE NEGATIVE Final    Comment: (NOTE) SARS-CoV-2 target nucleic acids are NOT DETECTED.  The SARS-CoV-2 RNA is generally detectable in upper respiratory specimens during the acute phase of infection. The lowest concentration of SARS-CoV-2 viral copies this assay can detect is 138 copies/mL. A negative result does not preclude SARS-Cov-2 infection and should not be used as the sole basis for treatment or other patient management decisions. A negative result may occur with  improper specimen collection/handling, submission of specimen other than nasopharyngeal swab, presence of viral mutation(s) within the areas  targeted by this assay, and inadequate number of viral copies(<138 copies/mL). A negative result must be combined with clinical observations, patient history, and epidemiological information. The expected result is Negative.  Fact Sheet for Patients:  EntrepreneurPulse.com.au  Fact Sheet for Healthcare Providers:  IncredibleEmployment.be  This test is no t yet approved or cleared by the [place] FDA and  has been authorized for detection and/or diagnosis of SARS-CoV-2 by FDA under an Emergency Use Authorization (EUA). This EUA will remain  in effect (meaning this test can be used) for the duration of the COVID-19 declaration under Section 564(b)(1) of the Act, 21 U.S.C.section 360bbb-3(b)(1), unless the authorization is terminated  or revoked sooner.       Influenza A by PCR NEGATIVE NEGATIVE Final   Influenza B by PCR NEGATIVE NEGATIVE Final    Comment: (NOTE) The Xpert Xpress SARS-CoV-2/FLU/RSV plus assay is intended as an aid in the diagnosis of influenza from Nasopharyngeal swab specimens and should not be used as a sole basis for treatment. Nasal washings and aspirates are unacceptable for Xpert Xpress SARS-CoV-2/FLU/RSV testing.  Fact Sheet for Patients: EntrepreneurPulse.com.au  Fact Sheet for Healthcare Providers: IncredibleEmployment.be  This test is not yet approved or cleared by the [place] FDA and has been authorized for detection and/or diagnosis of SARS-CoV-2 by FDA under an Emergency Use Authorization (EUA). This EUA will remain in effect (meaning this test can be used) for the duration of the COVID-19 declaration under Section 564(b)(1) of the Act, 21 U.S.C. section 360bbb-3(b)(1), unless the authorization is terminated or revoked.  Performed at [facility], [place] [address] [place], [place] [address]   Blood culture (routine x 2)     Status: None (Preliminary result)    Collection Time: 08/14/20  6:32 AM   Specimen: BLOOD  Result Value Ref Range Status   Specimen Description BLOOD SITE NOT SPECIFIED  Final   Special Requests   Final    BOTTLES DRAWN  AEROBIC AND ANAEROBIC Blood Culture adequate volume   Culture   Final    NO GROWTH 4 DAYS Performed at [place] [address], [place], [place] [address]    Report Status PENDING  Incomplete  Blood culture (routine x 2)     Status: None (Preliminary result)   Collection Time: 08/14/20 10:10 AM   Specimen: BLOOD LEFT HAND  Result Value Ref Range Status   Specimen Description BLOOD LEFT HAND  Final   Special Requests   Final    BOTTLES DRAWN AEROBIC AND ANAEROBIC Blood Culture adequate volume   Culture   Final    NO GROWTH 4 DAYS Performed at [facility], [place] [address], [place], [place] [address]    Report Status PENDING  Incomplete         Radiology Studies: CT ABDOMEN WO CONTRAST  Result Date: 08/18/2020 CLINICAL DATA:  Esophageal perforation, history of vomiting for 1 month. Pneumomediastinum. EXAM: CT CHEST AND ABDOMEN WITHOUT CONTRAST TECHNIQUE: Multidetector CT imaging of the chest and abdomen was performed following the standard protocol without intravenous contrast. COMPARISON:  08/15/2020. FINDINGS: CT CHEST FINDINGS WITHOUT CONTRAST Cardiovascular: Atherosclerotic calcification of the aorta. Heart size normal. No pericardial effusion. Mediastinum/Nodes: Persistent but improving pneumomediastinum. Subcutaneous emphysema extends into the neck, left greater than right. No pathologically enlarged mediastinal or axillary lymph nodes. Hilar regions are difficult to definitively evaluate without IV contrast. There is oral contrast in the lower esophagus. Esophagus is otherwise unremarkable. Lungs/Pleura: Mild scarring and a small cyst in the apical segment right upper lobe. Minimal subpleural nodularity in the left lower lobe, likely benign subpleural lymph nodes and or scarring.  Lungs are otherwise clear. No pleural fluid. Debris is seen in the upper trachea. Musculoskeletal: None. CT ABDOMEN FINDINGS WITHOUT CONTRAST Hepatobiliary: Liver is unremarkable. Cholecystectomy. No abnormal biliary ductal dilatation. Pancreas: Heterogeneous low-attenuation described in the pancreatic head on the prior exam may be due to fat in the ventral anlage. Otherwise negative. Spleen: Negative. Adrenals/Urinary Tract: Adrenal glands are unremarkable. Subcapsular fluid along the upper pole right kidney. Also likely 2.3 cm low-attenuation lesion in the upper pole right kidney, difficult to further characterize without post-contrast imaging. Moderate bilateral hydronephrosis with new bilateral double-J ureteral stents in place, partially imaged. Stomach/Bowel: Small hiatal hernia. Stomach and visualized portions of the small bowel and colon are otherwise unremarkable. Vascular/Lymphatic: Vascular structures are unremarkable. No pathologically enlarged lymph nodes. Other: No free fluid.  Mesenteries and peritoneum are unremarkable. Musculoskeletal: No worrisome lytic or sclerotic lesions. IMPRESSION: 1. Persistent but slightly improved pneumomediastinum, consistent with the given history of esophageal perforation. 2. Bilateral hydronephrosis with double-J ureteral stents in place. 3.  Aortic atherosclerosis (ICD10-I70.0). Electronically Signed   By: Lorin Picket M.D.   On: 08/18/2020 11:28   CT CHEST WO CONTRAST  Result Date: 08/18/2020 CLINICAL DATA:  Esophageal perforation, history of vomiting for 1 month. Pneumomediastinum. EXAM: CT CHEST AND ABDOMEN WITHOUT CONTRAST TECHNIQUE: Multidetector CT imaging of the chest and abdomen was performed following the standard protocol without intravenous contrast. COMPARISON:  08/15/2020. FINDINGS: CT CHEST FINDINGS WITHOUT CONTRAST Cardiovascular: Atherosclerotic calcification of the aorta. Heart size normal. No pericardial effusion. Mediastinum/Nodes: Persistent  but improving pneumomediastinum. Subcutaneous emphysema extends into the neck, left greater than right. No pathologically enlarged mediastinal or axillary lymph nodes. Hilar regions are difficult to definitively evaluate without IV contrast. There is oral contrast in the lower esophagus. Esophagus is otherwise unremarkable. Lungs/Pleura: Mild scarring and a small cyst in the  apical segment right upper lobe. Minimal subpleural nodularity in the left lower lobe, likely benign subpleural lymph nodes and or scarring. Lungs are otherwise clear. No pleural fluid. Debris is seen in the upper trachea. Musculoskeletal: None. CT ABDOMEN FINDINGS WITHOUT CONTRAST Hepatobiliary: Liver is unremarkable. Cholecystectomy. No abnormal biliary ductal dilatation. Pancreas: Heterogeneous low-attenuation described in the pancreatic head on the prior exam may be due to fat in the ventral anlage. Otherwise negative. Spleen: Negative. Adrenals/Urinary Tract: Adrenal glands are unremarkable. Subcapsular fluid along the upper pole right kidney. Also likely 2.3 cm low-attenuation lesion in the upper pole right kidney, difficult to further characterize without post-contrast imaging. Moderate bilateral hydronephrosis with new bilateral double-J ureteral stents in place, partially imaged. Stomach/Bowel: Small hiatal hernia. Stomach and visualized portions of the small bowel and colon are otherwise unremarkable. Vascular/Lymphatic: Vascular structures are unremarkable. No pathologically enlarged lymph nodes. Other: No free fluid.  Mesenteries and peritoneum are unremarkable. Musculoskeletal: No worrisome lytic or sclerotic lesions. IMPRESSION: 1. Persistent but slightly improved pneumomediastinum, consistent with the given history of esophageal perforation. 2. Bilateral hydronephrosis with double-J ureteral stents in place. 3.  Aortic atherosclerosis (ICD10-I70.0). Electronically Signed   By: Lorin Picket M.D.   On: 08/18/2020 11:28         Scheduled Meds: . Chlorhexidine Gluconate Cloth  6 each Topical Q0600  . folic acid  1 mg Intravenous Daily  . pantoprazole (PROTONIX) IV  40 mg Intravenous Q12H  . sodium chloride flush  3 mL Intravenous Q12H   Continuous Infusions: . sodium chloride 50 mL/hr at 08/17/20 1701  . meropenem (MERREM) IV 1 g (08/17/20 1708)     LOS: 4 days    Time spent: 35 minutes.   Domenic Polite, MD [facility]  08/18/2020, 1:53 PM

## 2020-08-18 NOTE — Plan of Care (Signed)
  Problem: Activity: Goal: Risk for activity intolerance will decrease Outcome: Progressing   

## 2020-08-18 NOTE — TOC Initial Note (Signed)
Transition of Care [facility]) - Initial/Assessment Note    Patient Details  Name: Marie Brewer MRN: [id number] Date of Birth: 01/31/1981  Transition of [facility]) CM/SW Contact:    Carles Collet, RN Phone Number: 08/18/2020, 2:24 PM  Clinical Narrative:              Spoke to patient at bedside.  She states that she lives at home w spouse. She was independent PTA, does not have any DME. When asked she feels like she is weaker, would benefit from PT eval prior to DC.  She confirms that she is uninsured, and without PCP. Discussed [place] providers and low cost pharmacy, will request CMA to schedule to establish PCP.  TOC will follow for medication needs      Expected Discharge Plan: [place] Barriers to Discharge: Continued Medical Work up   Patient Goals and CMS Choice Patient states their goals for this hospitalization and ongoing recovery are:: to feel better      Expected Discharge Plan and Services Expected Discharge Plan: [place]       Living arrangements for the past 2 months: Single Family Home                                      Prior Living Arrangements/Services Living arrangements for the past 2 months: Single Family Home Lives with:: Spouse                   Activities of Daily Living      Permission Sought/Granted                  Emotional Assessment              Admission diagnosis:  ARF (acute renal failure) ([place]) [N17.9] Acute renal failure (ARF) ([place]) [N17.9] Acute renal failure, unspecified acute renal failure type ([place]) [N17.9] Anemia, unspecified type [D64.9] Patient Active Problem List   Diagnosis Date Noted  . Anemia, unspecified   . Anemia   . ARF (acute renal failure) ([place]) 08/14/2020  . Acute renal failure (ARF) ([place]) 08/14/2020  . Cellulitis 05/08/2020  . Edema of right foot 05/08/2020  . AKI (acute kidney injury) ([place]) 05/08/2020  . Sepsis ([place]) 05/06/2020  . Sepsis due to  cellulitis ([place]) 05/05/2020  . Polysubstance abuse ([place]) 05/05/2020  . Hypokalemia 05/05/2020  . Hyponatremia 05/05/2020  . Cervical ca ([place]) 07/30/2013   PCP:  Merryl Hacker, No Pharmacy:   [place], [place] - [address] [place] [address] Phone: [phone] Fax: [phone]  [facility] [phone], [place] AT [address] [place] [address] Phone: [phone] Fax: [phone]     Social Determinants of Health (SDOH) Interventions    Readmission Risk Interventions No flowsheet data found.

## 2020-08-18 NOTE — Progress Notes (Addendum)
Allergy Note  Assessment:  Ms. Kook is a 40 year old female with an allergy to amoxicillin/clavulanate listed as anaphylaxis. I went to speak with the patient personally to find out some more information about her reaction. She explained that about 10 years ago she developed hives after taking the medication, she never had SOB, throat swelling and did not require medical attention. I have updated her allergy to hives in the medical chart. She does not recall taking any penicillin antibiotics since her reaction.   Since this reaction is Type 1 and it happened 10 years ago there is a low probability that she is still penicillin allergic. We will challenge her with a dose of amoxicillin, if she tolerates the dose her allergy will be deleted and she can safely receive penicillin antibiotics. I have spoken to the patient who agrees to the challenge and the nurse who agrees to monitor the patient every 15 minutes for the hour after administration.  Plan:  -Amoxicillin 500mg  x1  -Vitals q15 mins for hour after -Remove allergy if tolerated   Addendum Post-Challenge:  RN reported patient throwing up about 45 minutes after the administration of the amoxicillin. No capsule was found in the contents and no signs of allergic reaction such as a rash. The absorption of this medication is rapid so I am not concerned that she did not have a chance to be exposed to the medication to assess an allergic response. Therefore we will remove her penicillin allergy as she has proven she is no longer penicillin allergic.  Nicoletta Dress, PharmD, [place] [facility] Pharmacist  Phone: [phone]

## 2020-08-19 LAB — MULTIPLE MYELOMA PANEL, SERUM
Albumin SerPl Elph-Mcnc: 2.7 g/dL — ABNORMAL LOW (ref 2.9–4.4)
Albumin/Glob SerPl: 0.6 — ABNORMAL LOW (ref 0.7–1.7)
Alpha 1: 0.4 g/dL (ref 0.0–0.4)
Alpha2 Glob SerPl Elph-Mcnc: 1.1 g/dL — ABNORMAL HIGH (ref 0.4–1.0)
B-Globulin SerPl Elph-Mcnc: 1 g/dL (ref 0.7–1.3)
Gamma Glob SerPl Elph-Mcnc: 2.4 g/dL — ABNORMAL HIGH (ref 0.4–1.8)
Globulin, Total: 4.9 g/dL — ABNORMAL HIGH (ref 2.2–3.9)
IgA: 532 mg/dL — ABNORMAL HIGH (ref 87–352)
IgG (Immunoglobin G), Serum: 2207 mg/dL — ABNORMAL HIGH (ref 586–1602)
IgM (Immunoglobulin M), Srm: 135 mg/dL (ref 26–217)
Total Protein ELP: 7.6 g/dL (ref 6.0–8.5)

## 2020-08-19 LAB — CULTURE, BLOOD (ROUTINE X 2)
Culture: NO GROWTH
Culture: NO GROWTH
Special Requests: ADEQUATE
Special Requests: ADEQUATE

## 2020-08-19 LAB — CBC
HCT: 23.1 % — ABNORMAL LOW (ref 36.0–46.0)
Hemoglobin: 7.8 g/dL — ABNORMAL LOW (ref 12.0–15.0)
MCH: 29.9 pg (ref 26.0–34.0)
MCHC: 33.8 g/dL (ref 30.0–36.0)
MCV: 88.5 fL (ref 80.0–100.0)
Platelets: 230 10*3/uL (ref 150–400)
RBC: 2.61 MIL/uL — ABNORMAL LOW (ref 3.87–5.11)
RDW: 14.5 % (ref 11.5–15.5)
WBC: 9.3 10*3/uL (ref 4.0–10.5)
nRBC: 0 % (ref 0.0–0.2)

## 2020-08-19 LAB — BASIC METABOLIC PANEL
Anion gap: 14 (ref 5–15)
BUN: 67 mg/dL — ABNORMAL HIGH (ref 6–20)
CO2: 18 mmol/L — ABNORMAL LOW (ref 22–32)
Calcium: 8.4 mg/dL — ABNORMAL LOW (ref 8.9–10.3)
Chloride: 104 mmol/L (ref 98–111)
Creatinine, Ser: 5.85 mg/dL — ABNORMAL HIGH (ref 0.44–1.00)
GFR, Estimated: 9 mL/min — ABNORMAL LOW (ref >60)
Glucose, Bld: 107 mg/dL — ABNORMAL HIGH (ref 70–99)
Potassium: 2.7 mmol/L — CL (ref 3.5–5.1)
Sodium: 136 mmol/L (ref 135–145)

## 2020-08-19 MED ORDER — POTASSIUM CHLORIDE CRYS ER 20 MEQ PO TBCR
40.0000 meq | EXTENDED_RELEASE_TABLET | Freq: Two times a day (BID) | ORAL | Status: DC
  Filled 2020-08-19: qty 2

## 2020-08-19 MED ORDER — METOCLOPRAMIDE HCL 5 MG/ML IJ SOLN
10.0000 mg | Freq: Once | INTRAMUSCULAR | Status: AC
  Administered 2020-08-19: 10 mg via INTRAVENOUS
  Filled 2020-08-19: qty 2

## 2020-08-19 MED ORDER — ONDANSETRON 4 MG PO TBDP: 4.0000 mg | ORAL_TABLET | Freq: Three times a day (TID) | ORAL | Status: DC | PRN

## 2020-08-19 MED ORDER — POTASSIUM CHLORIDE IN NACL 40-0.9 MEQ/L-% IV SOLN
INTRAVENOUS | Status: DC
  Filled 2020-08-19 (×2): qty 1000

## 2020-08-19 MED ORDER — POTASSIUM CHLORIDE CRYS ER 20 MEQ PO TBCR
40.0000 meq | EXTENDED_RELEASE_TABLET | Freq: Two times a day (BID) | ORAL | Status: DC
  Administered 2020-08-19: 40 meq via ORAL
  Filled 2020-08-19: qty 2

## 2020-08-19 MED ORDER — POTASSIUM CHLORIDE 20 MEQ PO PACK
40.0000 meq | PACK | Freq: Once | ORAL | Status: DC
  Filled 2020-08-19: qty 2

## 2020-08-19 MED ORDER — AMOXICILLIN-POT CLAVULANATE 500-125 MG PO TABS
1.0000 | ORAL_TABLET | Freq: Two times a day (BID) | ORAL | Status: DC
  Administered 2020-08-19 – 2020-08-21 (×5): 500 mg via ORAL
  Filled 2020-08-19 (×6): qty 1

## 2020-08-19 MED ORDER — POTASSIUM CHLORIDE 10 MEQ/100ML IV SOLN
10.0000 meq | INTRAVENOUS | Status: AC
  Administered 2020-08-19: 10 meq via INTRAVENOUS
  Filled 2020-08-19: qty 100

## 2020-08-19 MED ORDER — ONDANSETRON 4 MG PO TBDP
4.0000 mg | ORAL_TABLET | Freq: Three times a day (TID) | ORAL | Status: DC | PRN
  Administered 2020-08-19: 4 mg via ORAL
  Filled 2020-08-19: qty 1

## 2020-08-19 MED ORDER — ONDANSETRON HCL 4 MG/2ML IJ SOLN
4.0000 mg | Freq: Three times a day (TID) | INTRAMUSCULAR | Status: DC | PRN
  Administered 2020-08-19: 4 mg via INTRAVENOUS
  Filled 2020-08-19: qty 2

## 2020-08-19 NOTE — Progress Notes (Signed)
Paged provider regarding labs  Potassium 2.7

## 2020-08-19 NOTE — Progress Notes (Addendum)
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, chronic anemia stopped using heroin 2 months ago and started experiencing nausea and vomiting over the past month -Presented to the ED on 1/15 was noted to be severely anemic with a hemoglobin of 5 and a creatinine of 10, on further work-up she was noted to have bilateral hydronephrosis and a pneumomediastinum -Seen by TCTS, recommended conservative management and repeat CT -For AKI with hydronephrosis underwent bilateral double-J stent placement  Assessment & Plan:   Acute on chronic anemia  -Anemia panel suggestive of chronic disease, also noted to have folic acid deficiency on supplements now  -Myeloma panel pending, skeletal survey is negative  -Transfused 4 units of PRBC this admission -Hemoglobin improved and stable now, seen by hematology in consultation recommended outpatient follow-up and may pursue bone marrow aspiration and biopsy as indicated -Has elevated kappa and lambda free light chains, ratio is normal, likely nonspecific, myeloma panel is pending  Acute Renal failure; bilateral hydronephrosis -Admitted with creatinine of 10, baseline is 1.3 -Imaging noted bilateral hydronephrosis, this is felt to be obstructive from bilateral ureteral strictures, prior history of pelvic XRT for uterine/cervical cancer -Appreciate urology input, underwent bilateral double-J stent placement on 1/16, urine output improving, creatinine slowly improving -Creatinine down to 5.8 today, will cut down IV fluids -Will need urology follow-up in 4-6 weeks  Pneumomediastinum; -Incidental finding on CT renal protocol. -CT chest and abdomen: Moderate amount of pneumomediastinum throughout the chest.  Not definitive focal esophageal injury  -Started on  empiric IV meropenem, bowel rest and T CTS was consulted -Repeat CT chest with oral Gastrografin contrast today does not show signs of leak, will advance to full liquid diet -Transition to Augmentin for 3 days  Abnormal attenuation pancreas on CT;  -Patient will need MRI down the road to rule out pancreatic Mass.   Positive hep C antibody -Follow-up with infectious disease  Hyponatremia -Resolved  Hypokalemia -Replace  Hypomagnesemia;  -Repleted  UTI; treated with IV meropenem primarily for pneumomediastinum Urine culture growing strep agalactiae.  -Transition to Augmentin for 3 days  Left LE wound; continue with wound care recommendation.   Estimated body mass index is 27.36 kg/m as calculated from the following:   Height as of 05/09/20: 5\' 6"  (1.676 m).   Weight as of this encounter: 76.9 kg.   DVT prophylaxis: SCDs due to severe anemia Code Status: Full code Family Communication: Discussed with patient in detail, no family at bedside Disposition Plan:  Status is: Inpatient  Remains inpatient appropriate because:Hemodynamically unstable   Dispo: The patient is from: Home              Anticipated d/c is to: Home              Anticipated d/c date is: Likely 2 days              Patient currently is not medically stable to d/c.    Consultants:   Urology  Nephrology  Hematology  CVTS  Procedures:   Renal US.     Antimicrobials:    Subjective: -Had multiple episodes of vomiting yesterday, some nausea but overall feeling a little better, denies any chest pain or discomfort  Objective: Vitals:   08/18/20 1700 08/18/20 2105 08/19/20 0446 08/19/20 0922  BP: 140/89 (!) 143/85 (!) 149/90 Marland Kitchen)  163/94  Pulse: 88 89 69 72  Resp: 17 18 16 17   Temp: 98 F (36.7 C) 98.1 F (36.7 C) (!) 97.5 F (36.4 C) 98.6 F (37 C)  TempSrc: Oral Oral Oral Oral  SpO2: 100% 100% 100% 100%  Weight:        Intake/Output Summary (Last 24 hours) at 08/19/2020 1118 Last  data filed at 08/19/2020 0800 Gross per 24 hour  Intake 2133.29 ml  Output 771 ml  Net 1362.29 ml   Filed Weights   08/17/20 0419  Weight: 76.9 kg    Examination:  General exam: Pleasant female sitting up in bed, AAOx3, no distress HEENT: No JVD CVS: S1-S2, regular rate rhythm Lungs: Clear bilaterally Abdomen: Soft, nontender, bowel sounds present  Extremities: No edema Skin: Left leg with multiple wounds with clean base   Data Reviewed: I have personally reviewed following labs and imaging studies  CBC: Recent Labs  Lab 08/14/20 0318 08/14/20 0632 08/15/20 0300 08/15/20 1241 08/15/20 1451 08/15/20 2149 08/16/20 0328 08/17/20 0856  WBC 15.7* 16.7* 13.9*  --   --   --  7.9 8.6  NEUTROABS  --  14.5*  --   --   --   --  7.1  --   HGB 6.4* 4.6* 5.3* 7.1* 6.4* 8.9* 7.4* 8.4*  HCT 20.6* 15.0* 17.2* 21.0* 19.2* 27.8* 22.9* 24.9*  MCV 90.7 91.5 90.5  --   --   --  89.5 87.4  PLT 348 343 274  --  274  --  277 419   Basic Metabolic Panel: Recent Labs  Lab 08/14/20 0318 08/14/20 0632 08/14/20 1432 08/15/20 0300 08/15/20 1241 08/16/20 0328 08/17/20 0856  NA 131*  --   --  132* 132* 132* 135  K 3.1*  --   --  3.0* 4.1 2.9* 3.1*  CL 87*  --   --  92* 95* 97* 99  CO2 24  --   --  22  --  19* 19*  GLUCOSE 102*  --   --  96 81 173* 77  BUN 114*  --   --  106* 124* 97* 93*  CREATININE 10.73*  --  8.19* 10.02* 9.00* 8.64* 7.65*  CALCIUM 7.3*  --   --  7.1*  --  8.2* 8.4*  MG  --  1.2*  --   --   --  1.7  --   PHOS  --   --   --   --   --  5.9*  --    GFR: Estimated Creatinine Clearance: 10.2 mL/min (A) (by C-G formula based on SCr of 7.65 mg/dL (H)). Liver Function Tests: Recent Labs  Lab 08/14/20 0318 08/15/20 0300 08/16/20 0328  AST 11* 9*  --   ALT 9 8  --   ALKPHOS 111 91  --   BILITOT 0.8 0.6  --   PROT 8.8* 7.5  --   ALBUMIN 2.6* 2.3* 2.2*   Recent Labs  Lab 08/14/20 0318  LIPASE 82*   No results for input(s): AMMONIA in the last 168  hours. Coagulation Profile: Recent Labs  Lab 08/15/20 1451  INR 1.2   Cardiac Enzymes: No results for input(s): CKTOTAL, CKMB, CKMBINDEX, TROPONINI in the last 168 hours. BNP (last 3 results) No results for input(s): PROBNP in the last 8760 hours. HbA1C: No results for input(s): HGBA1C in the last 72 hours. CBG: No results for input(s): GLUCAP in the last 168 hours. Lipid Profile: No results for input(s):  CHOL, HDL, LDLCALC, TRIG, CHOLHDL, LDLDIRECT in the last 72 hours. Thyroid Function Tests: No results for input(s): TSH, T4TOTAL, FREET4, T3FREE, THYROIDAB in the last 72 hours. Anemia Panel: No results for input(s): VITAMINB12, FOLATE, FERRITIN, TIBC, IRON, RETICCTPCT in the last 72 hours. Sepsis Labs: Recent Labs  Lab 08/14/20 2263 08/14/20 1021  LATICACIDVEN 1.4 1.1    Recent Results (from the past 240 hour(s))  Urine culture     Status: Abnormal   Collection Time: 08/14/20  5:20 AM   Specimen: Urine, Random  Result Value Ref Range Status   Specimen Description URINE, RANDOM  Final   Special Requests NONE  Final   Culture (A)  Final    >=100,000 COLONIES/mL GROUP B STREP(S.AGALACTIAE)ISOLATED TESTING AGAINST S. AGALACTIAE NOT ROUTINELY PERFORMED DUE TO PREDICTABILITY OF AMP/PEN/VAN SUSCEPTIBILITY. Performed at [facility], [place] [address], [place], [place] [address]    Report Status 08/15/2020 FINAL  Final  Resp Panel by RT-PCR (Flu A&B, Covid) Nasopharyngeal Swab     Status: None   Collection Time: 08/14/20  6:00 AM   Specimen: Nasopharyngeal Swab; Nasopharyngeal(NP) swabs in vial transport medium  Result Value Ref Range Status   SARS Coronavirus 2 by RT PCR NEGATIVE NEGATIVE Final    Comment: (NOTE) SARS-CoV-2 target nucleic acids are NOT DETECTED.  The SARS-CoV-2 RNA is generally detectable in upper respiratory specimens during the acute phase of infection. The lowest concentration of SARS-CoV-2 viral copies this assay can detect is 138 copies/mL. A  negative result does not preclude SARS-Cov-2 infection and should not be used as the sole basis for treatment or other patient management decisions. A negative result may occur with  improper specimen collection/handling, submission of specimen other than nasopharyngeal swab, presence of viral mutation(s) within the areas targeted by this assay, and inadequate number of viral copies(<138 copies/mL). A negative result must be combined with clinical observations, patient history, and epidemiological information. The expected result is Negative.  Fact Sheet for Patients:  EntrepreneurPulse.com.au  Fact Sheet for Healthcare Providers:  IncredibleEmployment.be  This test is no t yet approved or cleared by the [place] FDA and  has been authorized for detection and/or diagnosis of SARS-CoV-2 by FDA under an Emergency Use Authorization (EUA). This EUA will remain  in effect (meaning this test can be used) for the duration of the COVID-19 declaration under Section 564(b)(1) of the Act, 21 U.S.C.section 360bbb-3(b)(1), unless the authorization is terminated  or revoked sooner.       Influenza A by PCR NEGATIVE NEGATIVE Final   Influenza B by PCR NEGATIVE NEGATIVE Final    Comment: (NOTE) The Xpert Xpress SARS-CoV-2/FLU/RSV plus assay is intended as an aid in the diagnosis of influenza from Nasopharyngeal swab specimens and should not be used as a sole basis for treatment. Nasal washings and aspirates are unacceptable for Xpert Xpress SARS-CoV-2/FLU/RSV testing.  Fact Sheet for Patients: EntrepreneurPulse.com.au  Fact Sheet for Healthcare Providers: IncredibleEmployment.be  This test is not yet approved or cleared by the [place] FDA and has been authorized for detection and/or diagnosis of SARS-CoV-2 by FDA under an Emergency Use Authorization (EUA). This EUA will remain in effect (meaning this test can  be used) for the duration of the COVID-19 declaration under Section 564(b)(1) of the Act, 21 U.S.C. section 360bbb-3(b)(1), unless the authorization is terminated or revoked.  Performed at [facility], [place] [address] [place], [place] [address]   Blood culture (routine x 2)     Status: None  Collection Time: 08/14/20  6:32 AM   Specimen: BLOOD  Result Value Ref Range Status   Specimen Description BLOOD SITE NOT SPECIFIED  Final   Special Requests   Final    BOTTLES DRAWN AEROBIC AND ANAEROBIC Blood Culture adequate volume   Culture   Final    NO GROWTH 5 DAYS Performed at [facility], [address], [place], [place] [address]    Report Status 08/19/2020 FINAL  Final  Blood culture (routine x 2)     Status: None   Collection Time: 08/14/20 10:10 AM   Specimen: BLOOD LEFT HAND  Result Value Ref Range Status   Specimen Description BLOOD LEFT HAND  Final   Special Requests   Final    BOTTLES DRAWN AEROBIC AND ANAEROBIC Blood Culture adequate volume   Culture   Final    NO GROWTH 5 DAYS Performed at [facility], [place] [address], [place], [place] [address]    Report Status 08/19/2020 FINAL  Final         Radiology Studies: CT ABDOMEN WO CONTRAST  Result Date: 08/18/2020 CLINICAL DATA:  Esophageal perforation, history of vomiting for 1 month. Pneumomediastinum. EXAM: CT CHEST AND ABDOMEN WITHOUT CONTRAST TECHNIQUE: Multidetector CT imaging of the chest and abdomen was performed following the standard protocol without intravenous contrast. COMPARISON:  08/15/2020. FINDINGS: CT CHEST FINDINGS WITHOUT CONTRAST Cardiovascular: Atherosclerotic calcification of the aorta. Heart size normal. No pericardial effusion. Mediastinum/Nodes: Persistent but improving pneumomediastinum. Subcutaneous emphysema extends into the neck, left greater than right. No pathologically enlarged mediastinal or axillary lymph nodes. Hilar regions are difficult to definitively evaluate  without IV contrast. There is oral contrast in the lower esophagus. Esophagus is otherwise unremarkable. Lungs/Pleura: Mild scarring and a small cyst in the apical segment right upper lobe. Minimal subpleural nodularity in the left lower lobe, likely benign subpleural lymph nodes and or scarring. Lungs are otherwise clear. No pleural fluid. Debris is seen in the upper trachea. Musculoskeletal: None. CT ABDOMEN FINDINGS WITHOUT CONTRAST Hepatobiliary: Liver is unremarkable. Cholecystectomy. No abnormal biliary ductal dilatation. Pancreas: Heterogeneous low-attenuation described in the pancreatic head on the prior exam may be due to fat in the ventral anlage. Otherwise negative. Spleen: Negative. Adrenals/Urinary Tract: Adrenal glands are unremarkable. Subcapsular fluid along the upper pole right kidney. Also likely 2.3 cm low-attenuation lesion in the upper pole right kidney, difficult to further characterize without post-contrast imaging. Moderate bilateral hydronephrosis with new bilateral double-J ureteral stents in place, partially imaged. Stomach/Bowel: Small hiatal hernia. Stomach and visualized portions of the small bowel and colon are otherwise unremarkable. Vascular/Lymphatic: Vascular structures are unremarkable. No pathologically enlarged lymph nodes. Other: No free fluid.  Mesenteries and peritoneum are unremarkable. Musculoskeletal: No worrisome lytic or sclerotic lesions. IMPRESSION: 1. Persistent but slightly improved pneumomediastinum, consistent with the given history of esophageal perforation. 2. Bilateral hydronephrosis with double-J ureteral stents in place. 3.  Aortic atherosclerosis (ICD10-I70.0). Electronically Signed   By: Lorin Picket M.D.   On: 08/18/2020 11:28   CT CHEST WO CONTRAST  Result Date: 08/18/2020 CLINICAL DATA:  Esophageal perforation, history of vomiting for 1 month. Pneumomediastinum. EXAM: CT CHEST AND ABDOMEN WITHOUT CONTRAST TECHNIQUE: Multidetector CT imaging of the  chest and abdomen was performed following the standard protocol without intravenous contrast. COMPARISON:  08/15/2020. FINDINGS: CT CHEST FINDINGS WITHOUT CONTRAST Cardiovascular: Atherosclerotic calcification of the aorta. Heart size normal. No pericardial effusion. Mediastinum/Nodes: Persistent but improving pneumomediastinum. Subcutaneous emphysema extends into the neck, left greater than right. No  pathologically enlarged mediastinal or axillary lymph nodes. Hilar regions are difficult to definitively evaluate without IV contrast. There is oral contrast in the lower esophagus. Esophagus is otherwise unremarkable. Lungs/Pleura: Mild scarring and a small cyst in the apical segment right upper lobe. Minimal subpleural nodularity in the left lower lobe, likely benign subpleural lymph nodes and or scarring. Lungs are otherwise clear. No pleural fluid. Debris is seen in the upper trachea. Musculoskeletal: None. CT ABDOMEN FINDINGS WITHOUT CONTRAST Hepatobiliary: Liver is unremarkable. Cholecystectomy. No abnormal biliary ductal dilatation. Pancreas: Heterogeneous low-attenuation described in the pancreatic head on the prior exam may be due to fat in the ventral anlage. Otherwise negative. Spleen: Negative. Adrenals/Urinary Tract: Adrenal glands are unremarkable. Subcapsular fluid along the upper pole right kidney. Also likely 2.3 cm low-attenuation lesion in the upper pole right kidney, difficult to further characterize without post-contrast imaging. Moderate bilateral hydronephrosis with new bilateral double-J ureteral stents in place, partially imaged. Stomach/Bowel: Small hiatal hernia. Stomach and visualized portions of the small bowel and colon are otherwise unremarkable. Vascular/Lymphatic: Vascular structures are unremarkable. No pathologically enlarged lymph nodes. Other: No free fluid.  Mesenteries and peritoneum are unremarkable. Musculoskeletal: No worrisome lytic or sclerotic lesions. IMPRESSION: 1.  Persistent but slightly improved pneumomediastinum, consistent with the given history of esophageal perforation. 2. Bilateral hydronephrosis with double-J ureteral stents in place. 3.  Aortic atherosclerosis (ICD10-I70.0). Electronically Signed   By: Lorin Picket M.D.   On: 08/18/2020 11:28        Scheduled Meds: . Chlorhexidine Gluconate Cloth  6 each Topical Q0600  . folic acid  1 mg Intravenous Daily  . pantoprazole (PROTONIX) IV  40 mg Intravenous Q12H  . sodium chloride flush  3 mL Intravenous Q12H   Continuous Infusions: . sodium chloride Stopped (08/19/20 0502)  . meropenem (MERREM) IV 1 g (08/18/20 1819)     LOS: 5 days   Time spent: 25 minutes.   Domenic Polite, MD [facility]  08/19/2020, 11:18 AM

## 2020-08-19 NOTE — Progress Notes (Signed)
Patient refused her 4 doses of IV potassium because she said "burns her veins". Bag was hanged with normal saline to reduce burning patient expressed that was not helpful and therefore asked that RN stop infusion immediately. Zierle-Ghosh, MD notified. Will continue to monitor.   Donnia Poplaski,RN.

## 2020-08-20 LAB — CBC
HCT: 23.3 % — ABNORMAL LOW (ref 36.0–46.0)
Hemoglobin: 7.8 g/dL — ABNORMAL LOW (ref 12.0–15.0)
MCH: 29.8 pg (ref 26.0–34.0)
MCHC: 33.5 g/dL (ref 30.0–36.0)
MCV: 88.9 fL (ref 80.0–100.0)
Platelets: 220 10*3/uL (ref 150–400)
RBC: 2.62 MIL/uL — ABNORMAL LOW (ref 3.87–5.11)
RDW: 14.6 % (ref 11.5–15.5)
WBC: 8.8 10*3/uL (ref 4.0–10.5)
nRBC: 0 % (ref 0.0–0.2)

## 2020-08-20 LAB — BASIC METABOLIC PANEL
Anion gap: 10 (ref 5–15)
BUN: 60 mg/dL — ABNORMAL HIGH (ref 6–20)
CO2: 20 mmol/L — ABNORMAL LOW (ref 22–32)
Calcium: 8.1 mg/dL — ABNORMAL LOW (ref 8.9–10.3)
Chloride: 105 mmol/L (ref 98–111)
Creatinine, Ser: 5.55 mg/dL — ABNORMAL HIGH (ref 0.44–1.00)
GFR, Estimated: 9 mL/min — ABNORMAL LOW (ref >60)
Glucose, Bld: 87 mg/dL (ref 70–99)
Potassium: 3.3 mmol/L — ABNORMAL LOW (ref 3.5–5.1)
Sodium: 135 mmol/L (ref 135–145)

## 2020-08-20 MED ORDER — PROMETHAZINE HCL 25 MG/ML IJ SOLN
12.5000 mg | Freq: Three times a day (TID) | INTRAMUSCULAR | Status: DC | PRN
  Administered 2020-08-20 – 2020-08-22 (×5): 12.5 mg via INTRAVENOUS
  Filled 2020-08-20 (×5): qty 1

## 2020-08-20 NOTE — Progress Notes (Signed)
Patient ID: Marie Brewer, female   DOB: Oct 13, 1980, 40 y.o.   MRN: [id number]   Medical records reviewed  This NP visited patient at the bedside as a follow up to  yesterday's [place].   Patient is alert and oriented x3  Again today the patient tells me she has "everything she needs" and does not have any interest in talking with palliative medicine at this time.  Education offerd to  patient the importance of continued conversation with her family and the  medical providers regarding overall plan of care and treatment options,  ensuring decisions are within the context of the patients values and GOCs.  Patient has PMT contact information and is encouraged to call with questions or concerns.  Questions and concerns addressed   Discussed with Dr Broadus John and PMT will sign off at this time.   Please re consult with future needs.  Total time spent on the unit was 20 minutes  Greater than 50% of the time was spent in counseling and coordination of care  Wadie Lessen NP  Palliative Medicine Team Team Phone # [phone] Pager [phone]

## 2020-08-20 NOTE — Progress Notes (Addendum)
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, chronic anemia stopped using heroin 2 months ago and started experiencing nausea and vomiting over the past month -Presented to the ED on 1/15 was noted to be severely anemic with a hemoglobin of 5 and a creatinine of 10, on further work-up she was noted to have bilateral hydronephrosis and a pneumomediastinum -Seen by TCTS, recommended conservative management and repeat CT -For AKI with hydronephrosis underwent bilateral double-J stent placement  Assessment & Plan:   Acute on chronic anemia  -Anemia panel suggestive of chronic disease, also noted to have folic acid deficiency on supplements now  -Myeloma panel  with polyclonal increase in immunoglobulins  -Transfused 4 units of PRBC this admission -Hemoglobin improved and stable now, seen by hematology in consultation recommended outpatient follow-up and may pursue bone marrow aspiration and biopsy as indicated -CBC in am  Acute Renal failure; bilateral hydronephrosis -Admitted with creatinine of 10, baseline is 1.3 -Imaging noted bilateral hydronephrosis, this is felt to be obstructive from bilateral ureteral strictures, prior history of pelvic XRT for uterine/cervical cancer -Appreciate urology input, underwent bilateral double-J stent placement on 1/16, urine output improving, creatinine slowly improving -Creatinine down to 5.5 today, continue gentle IV fluids at a slow rate -Monitor urine output, BMP in a.m.  Pneumomediastinum; -Incidental finding on CT renal protocol. -CT chest and abdomen: Moderate amount of pneumomediastinum throughout the chest.  Not definitive focal esophageal injury  -Started on empiric IV meropenem, bowel rest and T CTS was consulted -Repeat CT chest with oral  Gastrografin contrast did not show any leak, advance to soft diet, and transition to oral Augmentin   Abnormal attenuation pancreas on CT;  -Patient will need MRI down the road to rule out pancreatic Mass.   Positive hep C antibody -Follow-up with infectious disease  Hyponatremia -Resolved  Hypokalemia -Replace  Hypomagnesemia;  -Repleted  UTI; treated with IV meropenem primarily for pneumomediastinum Urine culture growing strep agalactiae.  -Transitioned to Augmentin for 3 days  Left LE wound; continue with wound care recommendation.   Estimated body mass index is 27.4 kg/m as calculated from the following:   Height as of 05/09/20: 5\' 6"  (1.676 m).   Weight as of this encounter: 77 kg.   DVT prophylaxis: SCDs due to severe anemia Code Status: Full code Family Communication: Discussed with patient in detail, no family at bedside Disposition Plan:  Status is: Inpatient  Remains inpatient appropriate because:Hemodynamically unstable   Dispo: The patient is from: Home              Anticipated d/c is to: Home              Anticipated d/c date is: Likely 1 to 2 days              Patient currently is not medically stable to d/c.    Consultants:   Urology  Nephrology  Hematology  CVTS  Procedures:   Renal US.     Antimicrobials:    Subjective: -2 episodes of vomiting yesterday however was able to tolerate dinner and breakfast this morning -Denies any abdominal or chest pain  Objective: Vitals:   08/19/20 1802 08/19/20 2110 08/20/20 0440 08/20/20 0928  BP: (!) 150/84 (!) 155/83 (!) 156/89 (!) 154/91  Pulse: 74 62 82 84  Resp: 17 18  18 18  Temp: 97.8 F (36.6 C) 98.9 F (37.2 C) 98.3 F (36.8 C) 98.2 F (36.8 C)  TempSrc: Oral Oral Oral   SpO2: 100% 100% 100% 100%  Weight:  77 kg      Intake/Output Summary (Last 24 hours) at 08/20/2020 1136 Last data filed at 08/20/2020 0800 Gross per 24 hour  Intake 1360.24 ml  Output 620 ml  Net 740.24  ml   Filed Weights   08/17/20 0419 08/19/20 2110  Weight: 76.9 kg 77 kg    Examination:  General exam: Pleasant female sitting up in bed, AOx3, no distress HEENT: No JVD CVS: S1-S2, regular rate rhythm Lungs: Clear bilaterally Abdomen: Soft, nontender, bowel sounds present Extremities: No edema Skin: Left leg with multiple wounds with clean base   Data Reviewed: I have personally reviewed following labs and imaging studies  CBC: Recent Labs  Lab 08/14/20 0632 08/15/20 0300 08/15/20 1241 08/15/20 1451 08/15/20 2149 08/16/20 0328 08/17/20 0856 08/19/20 1006 08/20/20 0409  WBC 16.7* 13.9*  --   --   --  7.9 8.6 9.3 8.8  NEUTROABS 14.5*  --   --   --   --  7.1  --   --   --   HGB 4.6* 5.3*   < > 6.4* 8.9* 7.4* 8.4* 7.8* 7.8*  HCT 15.0* 17.2*   < > 19.2* 27.8* 22.9* 24.9* 23.1* 23.3*  MCV 91.5 90.5  --   --   --  89.5 87.4 88.5 88.9  PLT 343 274  --  274  --  277 202 230 220   < > = values in this interval not displayed.   Basic Metabolic Panel: Recent Labs  Lab 08/14/20 0632 08/14/20 1432 08/15/20 0300 08/15/20 1241 08/16/20 0328 08/17/20 0856 08/19/20 1006 08/20/20 0409  NA  --   --  132* 132* 132* 135 136 135  K  --   --  3.0* 4.1 2.9* 3.1* 2.7* 3.3*  CL  --   --  92* 95* 97* 99 104 105  CO2  --   --  22  --  19* 19* 18* 20*  GLUCOSE  --   --  96 81 173* 77 107* 87  BUN  --   --  106* 124* 97* 93* 67* 60*  CREATININE  --    < > 10.02* 9.00* 8.64* 7.65* 5.85* 5.55*  CALCIUM  --   --  7.1*  --  8.2* 8.4* 8.4* 8.1*  MG 1.2*  --   --   --  1.7  --   --   --   PHOS  --   --   --   --  5.9*  --   --   --    < > = values in this interval not displayed.   GFR: Estimated Creatinine Clearance: 14.1 mL/min (A) (by C-G formula based on SCr of 5.55 mg/dL (H)). Liver Function Tests: Recent Labs  Lab 08/14/20 0318 08/15/20 0300 08/16/20 0328  AST 11* 9*  --   ALT 9 8  --   ALKPHOS 111 91  --   BILITOT 0.8 0.6  --   PROT 8.8* 7.5  --   ALBUMIN 2.6* 2.3* 2.2*    Recent Labs  Lab 08/14/20 0318  LIPASE 82*   No results for input(s): AMMONIA in the last 168 hours. Coagulation Profile: Recent Labs  Lab 08/15/20 1451  INR 1.2   Cardiac Enzymes: No results for input(s): CKTOTAL, CKMB,  CKMBINDEX, TROPONINI in the last 168 hours. BNP (last 3 results) No results for input(s): PROBNP in the last 8760 hours. HbA1C: No results for input(s): HGBA1C in the last 72 hours. CBG: No results for input(s): GLUCAP in the last 168 hours. Lipid Profile: No results for input(s): CHOL, HDL, LDLCALC, TRIG, CHOLHDL, LDLDIRECT in the last 72 hours. Thyroid Function Tests: No results for input(s): TSH, T4TOTAL, FREET4, T3FREE, THYROIDAB in the last 72 hours. Anemia Panel: No results for input(s): VITAMINB12, FOLATE, FERRITIN, TIBC, IRON, RETICCTPCT in the last 72 hours. Sepsis Labs: Recent Labs  Lab 08/14/20 0175 08/14/20 1021  LATICACIDVEN 1.4 1.1    Recent Results (from the past 240 hour(s))  Urine culture     Status: Abnormal   Collection Time: 08/14/20  5:20 AM   Specimen: Urine, Random  Result Value Ref Range Status   Specimen Description URINE, RANDOM  Final   Special Requests NONE  Final   Culture (A)  Final    >=100,000 COLONIES/mL GROUP B STREP(S.AGALACTIAE)ISOLATED TESTING AGAINST S. AGALACTIAE NOT ROUTINELY PERFORMED DUE TO PREDICTABILITY OF AMP/PEN/VAN SUSCEPTIBILITY. Performed at [facility], [place] [address], [place], [place] [address]    Report Status 08/15/2020 FINAL  Final  Resp Panel by RT-PCR (Flu A&B, Covid) Nasopharyngeal Swab     Status: None   Collection Time: 08/14/20  6:00 AM   Specimen: Nasopharyngeal Swab; Nasopharyngeal(NP) swabs in vial transport medium  Result Value Ref Range Status   SARS Coronavirus 2 by RT PCR NEGATIVE NEGATIVE Final    Comment: (NOTE) SARS-CoV-2 target nucleic acids are NOT DETECTED.  The SARS-CoV-2 RNA is generally detectable in upper respiratory specimens during the acute phase of  infection. The lowest concentration of SARS-CoV-2 viral copies this assay can detect is 138 copies/mL. A negative result does not preclude SARS-Cov-2 infection and should not be used as the sole basis for treatment or other patient management decisions. A negative result may occur with  improper specimen collection/handling, submission of specimen other than nasopharyngeal swab, presence of viral mutation(s) within the areas targeted by this assay, and inadequate number of viral copies(<138 copies/mL). A negative result must be combined with clinical observations, patient history, and epidemiological information. The expected result is Negative.  Fact Sheet for Patients:  EntrepreneurPulse.com.au  Fact Sheet for Healthcare Providers:  IncredibleEmployment.be  This test is no t yet approved or cleared by the [place] FDA and  has been authorized for detection and/or diagnosis of SARS-CoV-2 by FDA under an Emergency Use Authorization (EUA). This EUA will remain  in effect (meaning this test can be used) for the duration of the COVID-19 declaration under Section 564(b)(1) of the Act, 21 U.S.C.section 360bbb-3(b)(1), unless the authorization is terminated  or revoked sooner.       Influenza A by PCR NEGATIVE NEGATIVE Final   Influenza B by PCR NEGATIVE NEGATIVE Final    Comment: (NOTE) The Xpert Xpress SARS-CoV-2/FLU/RSV plus assay is intended as an aid in the diagnosis of influenza from Nasopharyngeal swab specimens and should not be used as a sole basis for treatment. Nasal washings and aspirates are unacceptable for Xpert Xpress SARS-CoV-2/FLU/RSV testing.  Fact Sheet for Patients: EntrepreneurPulse.com.au  Fact Sheet for Healthcare Providers: IncredibleEmployment.be  This test is not yet approved or cleared by the [place] FDA and has been authorized for detection and/or diagnosis of SARS-CoV-2  by FDA under an Emergency Use Authorization (EUA). This EUA will remain in effect (meaning this test can be used) for the  duration of the COVID-19 declaration under Section 564(b)(1) of the Act, 21 U.S.C. section 360bbb-3(b)(1), unless the authorization is terminated or revoked.  Performed at [facility], [place] [address], [place], [place] [address]   Blood culture (routine x 2)     Status: None   Collection Time: 08/14/20  6:32 AM   Specimen: BLOOD  Result Value Ref Range Status   Specimen Description BLOOD SITE NOT SPECIFIED  Final   Special Requests   Final    BOTTLES DRAWN AEROBIC AND ANAEROBIC Blood Culture adequate volume   Culture   Final    NO GROWTH 5 DAYS Performed at [facility], [address] [place], [place] [address]    Report Status 08/19/2020 FINAL  Final  Blood culture (routine x 2)     Status: None   Collection Time: 08/14/20 10:10 AM   Specimen: BLOOD LEFT HAND  Result Value Ref Range Status   Specimen Description BLOOD LEFT HAND  Final   Special Requests   Final    BOTTLES DRAWN AEROBIC AND ANAEROBIC Blood Culture adequate volume   Culture   Final    NO GROWTH 5 DAYS Performed at [facility], [place] [address], [place],  [address]    Report Status 08/19/2020 FINAL  Final     Scheduled Meds: . amoxicillin-clavulanate  1 tablet Oral BID  . Chlorhexidine Gluconate Cloth  6 each Topical Q0600  . folic acid  1 mg Intravenous Daily  . pantoprazole (PROTONIX) IV  40 mg Intravenous Q12H  . sodium chloride flush  3 mL Intravenous Q12H   Continuous Infusions: . 0.9 % NaCl with KCl 40 mEq / L 50 mL/hr at 08/19/20 1613     LOS: 6 days   Time spent: 25 minutes.   Domenic Polite, MD [facility]  08/20/2020, 11:36 AM

## 2020-08-20 NOTE — Progress Notes (Signed)
Brief Oncology Note:  I stopped by to visit Marie Brewer. Remains in the hospital due to episodes of vomiting. Resting quietly at the time of my visit.   Labs reviewed. Hemoglobin is stable. Creatinine down to 5.55 today. Discussed MM workup with the patient. UPEP, SPEP, and light chains not indicative of MM. Recommend that she follow-up with [place] as an outpatient to continue to monitor her anemia.  CBC    Component Value Date/Time   WBC 8.8 08/20/2020 0409   RBC 2.62 (L) 08/20/2020 0409   HGB 7.8 (L) 08/20/2020 0409   HCT 23.3 (L) 08/20/2020 0409   PLT 220 08/20/2020 0409   MCV 88.9 08/20/2020 0409   MCH 29.8 08/20/2020 0409   MCHC 33.5 08/20/2020 0409   RDW 14.6 08/20/2020 0409   LYMPHSABS 0.5 (L) 08/16/2020 0328   MONOABS 0.1 08/16/2020 0328   EOSABS 0.0 08/16/2020 0328   BASOSABS 0.0 08/16/2020 0328   CMP Latest Ref Rng & Units 08/20/2020 08/19/2020 08/17/2020  Glucose 70 - 99 mg/dL 87 107(H) 77  BUN 6 - 20 mg/dL 60(H) 67(H) 93(H)  Creatinine 0.44 - 1.00 mg/dL 5.55(H) 5.85(H) 7.65(H)  Sodium 135 - 145 mmol/L 135 136 135  Potassium 3.5 - 5.1 mmol/L 3.3(L) 2.7(LL) 3.1(L)  Chloride 98 - 111 mmol/L 105 104 99  CO2 22 - 32 mmol/L 20(L) 18(L) 19(L)  Calcium 8.9 - 10.3 mg/dL 8.1(L) 8.4(L) 8.4(L)  Total Protein 6.5 - 8.1 g/dL - - -  Total Bilirubin 0.3 - 1.2 mg/dL - - -  Alkaline Phos 38 - 126 U/L - - -  AST 15 - 41 U/L - - -  ALT 0 - 44 U/L - - -   Results for Marie Brewer, Marie Brewer (MRN [id number]) as of 08/20/2020 16:20  Ref. Range 08/15/2020 14:51  Total Protein ELP Latest Ref Range: 6.0 - 8.5 g/dL 7.6  Albumin SerPl Elph-Mcnc Latest Ref Range: 2.9 - 4.4 g/dL 2.7 (L)  Albumin/Glob SerPl Latest Ref Range: 0.7 - 1.7  0.6 (L)  Alpha2 Glob SerPl Elph-Mcnc Latest Ref Range: 0.4 - 1.0 g/dL 1.1 (H)  Alpha 1 Latest Ref Range: 0.0 - 0.4 g/dL 0.4  Gamma Glob SerPl Elph-Mcnc Latest Ref Range: 0.4 - 1.8 g/dL 2.4 (H)  M Protein SerPl Elph-Mcnc Latest Ref Range: Not Observed g/dL Not Observed  IFE 1  Unknown Comment (A)  Globulin, Total Latest Ref Range: 2.2 - 3.9 g/dL 4.9 (H)  B-Globulin SerPl Elph-Mcnc Latest Ref Range: 0.7 - 1.3 g/dL 1.0  IgG (Immunoglobin G), Serum Latest Ref Range: 586 - 1,602 mg/dL 2,207 (H)  IgM (Immunoglobulin M), Srm Latest Ref Range: 26 - 217 mg/dL 135  IgA Latest Ref Range: 87 - 352 mg/dL 532 (H)    Results for Marie Brewer, Marie Brewer (MRN [id number]) as of 08/20/2020 16:20  Ref. Range 08/15/2020 23:30  ALBUMIN, U Latest Units: % 21.1  ALPHA 1 URINE Latest Units: % 8.4  Alpha 2, Urine Latest Units: % 20.1  % BETA, Urine Latest Units: % 28.7  Free Kappa Lt Chains,Ur Latest Ref Range: 0.63 - 113.79 mg/L 307.77 (H)  Free Kappa/Lambda Ratio Latest Ref Range: 1.03 - 31.76  1.84  Free Lambda Lt Chains,Ur Latest Ref Range: 0.47 - 11.77 mg/L 167.43 (H)  GAMMA GLOBULIN URINE Latest Units: % 21.7  Immunofixation Result, Urine Unknown Comment  M-SPIKE %, Urine Latest Ref Range: Not Observed % Not Observed  Total Protein, Urine-Ur/day Latest Ref Range: 30 - 150 mg/24 hr 1,069 (H)  Total Protein, Ethlyn Daniels Latest  Ref Range: Not Estab. mg/dL 97.2   Results for Marie Brewer, Marie Brewer (MRN [id number]) as of 08/20/2020 16:20  Ref. Range 08/15/2020 14:51  Kappa free light chain Latest Ref Range: 3.3 - 19.4 mg/L 286.7 (H)  Lamda free light chains Latest Ref Range: 5.7 - 26.3 mg/L 230.8 (H)  Kappa, lamda light chain ratio Latest Ref Range: 0.26 - 1.65  1.24   She is currently scheduled to see [place] at the [facility] on 09/01/2020.   Mikey Bussing, DNP, AGPCNP-BC, AOCNP

## 2020-08-20 NOTE — Plan of Care (Signed)
  Problem: Education: Goal: Knowledge of General Education information will improve Description Including pain rating scale, medication(s)/side effects and non-pharmacologic comfort measures Outcome: Progressing   

## 2020-08-21 LAB — CBC
HCT: 21.5 % — ABNORMAL LOW (ref 36.0–46.0)
Hemoglobin: 6.7 g/dL — CL (ref 12.0–15.0)
MCH: 28.5 pg (ref 26.0–34.0)
MCHC: 31.2 g/dL (ref 30.0–36.0)
MCV: 91.5 fL (ref 80.0–100.0)
Platelets: 194 10*3/uL (ref 150–400)
RBC: 2.35 MIL/uL — ABNORMAL LOW (ref 3.87–5.11)
RDW: 14.7 % (ref 11.5–15.5)
WBC: 10.1 10*3/uL (ref 4.0–10.5)
nRBC: 0 % (ref 0.0–0.2)

## 2020-08-21 LAB — BASIC METABOLIC PANEL
Anion gap: 11 (ref 5–15)
BUN: 51 mg/dL — ABNORMAL HIGH (ref 6–20)
CO2: 19 mmol/L — ABNORMAL LOW (ref 22–32)
Calcium: 8.2 mg/dL — ABNORMAL LOW (ref 8.9–10.3)
Chloride: 104 mmol/L (ref 98–111)
Creatinine, Ser: 4.89 mg/dL — ABNORMAL HIGH (ref 0.44–1.00)
GFR, Estimated: 11 mL/min — ABNORMAL LOW (ref >60)
Glucose, Bld: 96 mg/dL (ref 70–99)
Potassium: 3.8 mmol/L (ref 3.5–5.1)
Sodium: 134 mmol/L — ABNORMAL LOW (ref 135–145)

## 2020-08-21 LAB — HEMOGLOBIN AND HEMATOCRIT, BLOOD
HCT: 25 % — ABNORMAL LOW (ref 36.0–46.0)
Hemoglobin: 8 g/dL — ABNORMAL LOW (ref 12.0–15.0)

## 2020-08-21 LAB — PREPARE RBC (CROSSMATCH)

## 2020-08-21 MED ORDER — SODIUM CHLORIDE 0.9% IV SOLUTION: Freq: Once | INTRAVENOUS | Status: AC

## 2020-08-21 MED ORDER — DARBEPOETIN ALFA 40 MCG/0.4ML IJ SOSY
40.0000 ug | PREFILLED_SYRINGE | INTRAMUSCULAR | Status: DC
  Administered 2020-08-21: 40 ug via SUBCUTANEOUS
  Filled 2020-08-21: qty 0.4

## 2020-08-21 MED ORDER — SODIUM CHLORIDE 0.9% IV SOLUTION: Freq: Once | INTRAVENOUS | Status: DC

## 2020-08-21 MED ORDER — AMOXICILLIN-POT CLAVULANATE 500-125 MG PO TABS
1.0000 | ORAL_TABLET | Freq: Two times a day (BID) | ORAL | Status: AC
  Administered 2020-08-21: 500 mg via ORAL
  Filled 2020-08-21: qty 1

## 2020-08-21 MED ORDER — DIPHENHYDRAMINE HCL 25 MG PO CAPS
25.0000 mg | ORAL_CAPSULE | Freq: Once | ORAL | Status: AC
  Administered 2020-08-21: 25 mg via ORAL
  Filled 2020-08-21: qty 1

## 2020-08-21 MED ORDER — ACETAMINOPHEN 325 MG PO TABS
650.0000 mg | ORAL_TABLET | Freq: Once | ORAL | Status: AC
  Administered 2020-08-21: 650 mg via ORAL
  Filled 2020-08-21: qty 2

## 2020-08-21 NOTE — Progress Notes (Signed)
   08/21/20 0618  Provider Notification  Provider Name/Title Dr. Mitchell Heir  Date Provider Notified 08/21/20  Time Provider Notified [phone]  Notification Type Page  Notification Reason Change in status  Response See new orders  Date of Provider Response 08/21/20   Received critical lab results:  Hgb - 6.7  Hct - 21.5 - Dr. Sidney Ace made aware.  New orders received and implemented.  Will continue to monitor patient.  Earleen Reaper RN

## 2020-08-21 NOTE — Plan of Care (Signed)
  Problem: Activity: Goal: Risk for activity intolerance will decrease Outcome: Progressing   Problem: Nutrition: Goal: Adequate nutrition will be maintained Outcome: Progressing   Problem: Coping: Goal: Level of anxiety will decrease Outcome: Not Progressing

## 2020-08-21 NOTE — Progress Notes (Signed)
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, chronic anemia stopped using heroin 2 months ago and started experiencing nausea and vomiting over the past month -Presented to the ED on 1/15 was noted to be severely anemic with a hemoglobin of 5 and a creatinine of 10, on further work-up she was noted to have bilateral hydronephrosis and a pneumomediastinum -Seen by TCTS, recommended conservative management and repeat CT -For AKI with hydronephrosis underwent bilateral double-J stent placement  Assessment & Plan:   Acute on chronic anemia  -Anemia panel suggestive of chronic disease, also noted to have folic acid deficiency on supplements now  -Myeloma panel  with polyclonal increase in immunoglobulins  -Transfused 4 units of PRBC this admission -seen by hematology in consultation recommended outpatient follow-up and may pursue bone marrow aspiration and biopsy as indicated -Hemoglobin down to 6.7 today, likely a component of hemodilution to, will transfuse 1 unit of PRBC and dose of Aranesp today -CBC in a.m.  Acute Renal failure; bilateral hydronephrosis -Admitted with creatinine of 10, baseline is 1.3 -Imaging noted bilateral hydronephrosis, this is felt to be obstructive from bilateral ureteral strictures, prior history of pelvic XRT for uterine/cervical cancer -Appreciate urology input, underwent bilateral double-J stent placement on 1/16, urine output improving, creatinine slowly improving -Creatinine improving down to 4.8 today, will discontinue IV fluids -Urine output is improving, check BMP in a.m.  Pneumomediastinum; -Incidental finding on CT renal protocol. -CT chest and abdomen: Moderate amount of pneumomediastinum throughout the chest.  Not definitive focal esophageal injury   -Started on empiric IV meropenem, bowel rest and T CTS was consulted -Repeat CT chest with oral Gastrografin contrast did not show any leak, advance to soft diet, and transitioned to oral Augmentin, discontinue after today's dose  Abnormal attenuation pancreas on CT;  -Patient will need MRI down the road to rule out pancreatic Mass.   Positive hep C antibody -Follow-up with infectious disease  Hyponatremia -Resolved  Hypokalemia -Replace  Hypomagnesemia;  -Repleted  UTI; treated with IV meropenem primarily for pneumomediastinum Urine culture growing strep agalactiae.  -Transitioned to Augmentin for 3 days, stop after today's dose  Left LE wound; continue with wound care recommendation.   Estimated body mass index is 27.4 kg/m as calculated from the following:   Height as of 05/09/20: 5\' 6"  (1.676 m).   Weight as of this encounter: 77 kg.   DVT prophylaxis: SCDs due to severe anemia Code Status: Full code Family Communication: Discussed with patient in detail, no family at bedside Disposition Plan:  Status is: Inpatient  Remains inpatient appropriate because:Hemodynamically unstable   Dispo: The patient is from: Home              Anticipated d/c is to: Home              Anticipated d/c date is: Likely 1 to 2 days              Patient currently is not medically stable to d/c.    Consultants:   Urology  Nephrology  Hematology  CVTS  Procedures:   Renal US.     Antimicrobials:    Subjective: - no vomiting today, had 1 episode of this yesterday, denies any melena or hematochezia  Objective: Vitals:   08/20/20 2039 08/21/20 0441 08/21/20 1102 08/21/20 1146  BP: Marland Kitchen)  129/100 (!) 145/76 (!) 147/90 (!) 147/85  Pulse: 87 77 95 100  Resp: 18 18 18 18   Temp: 98.9 F (37.2 C) 99.1 F (37.3 C) 98.9 F (37.2 C) 97.9 F (36.6 C)  TempSrc: Oral Oral Oral Oral  SpO2: 100% 100% 100% 100%  Weight:        Intake/Output Summary (Last 24 hours) at  08/21/2020 1208 Last data filed at 08/21/2020 1100 Gross per 24 hour  Intake 1609.25 ml  Output 2975 ml  Net -1365.75 ml   Filed Weights   08/17/20 0419 08/19/20 2110  Weight: 76.9 kg 77 kg    Examination:  General exam: Pleasant female sitting up in bed eating breakfast, AAOx3, no distress HEENT: No JVD CVS: S1-S2, regular rate rhythm Lungs: Clear bilaterally Abdomen: Soft, nontender, bowel sounds present Extremities: No edema  Skin: Left leg with multiple wounds with clean base   Data Reviewed: I have personally reviewed following labs and imaging studies  CBC: Recent Labs  Lab 08/16/20 0328 08/17/20 0856 08/19/20 1006 08/20/20 0409 08/21/20 0351  WBC 7.9 8.6 9.3 8.8 10.1  NEUTROABS 7.1  --   --   --   --   HGB 7.4* 8.4* 7.8* 7.8* 6.7*  HCT 22.9* 24.9* 23.1* 23.3* 21.5*  MCV 89.5 87.4 88.5 88.9 91.5  PLT 277 202 230 220 782   Basic Metabolic Panel: Recent Labs  Lab 08/16/20 0328 08/17/20 0856 08/19/20 1006 08/20/20 0409 08/21/20 0351  NA 132* 135 136 135 134*  K 2.9* 3.1* 2.7* 3.3* 3.8  CL 97* 99 104 105 104  CO2 19* 19* 18* 20* 19*  GLUCOSE 173* 77 107* 87 96  BUN 97* 93* 67* 60* 51*  CREATININE 8.64* 7.65* 5.85* 5.55* 4.89*  CALCIUM 8.2* 8.4* 8.4* 8.1* 8.2*  MG 1.7  --   --   --   --   PHOS 5.9*  --   --   --   --    GFR: Estimated Creatinine Clearance: 16 mL/min (A) (by C-G formula based on SCr of 4.89 mg/dL (H)). Liver Function Tests: Recent Labs  Lab 08/15/20 0300 08/16/20 0328  AST 9*  --   ALT 8  --   ALKPHOS 91  --   BILITOT 0.6  --   PROT 7.5  --   ALBUMIN 2.3* 2.2*   No results for input(s): LIPASE, AMYLASE in the last 168 hours. No results for input(s): AMMONIA in the last 168 hours. Coagulation Profile: Recent Labs  Lab 08/15/20 1451  INR 1.2   Cardiac Enzymes: No results for input(s): CKTOTAL, CKMB, CKMBINDEX, TROPONINI in the last 168 hours. BNP (last 3 results) No results for input(s): PROBNP in the last 8760  hours. HbA1C: No results for input(s): HGBA1C in the last 72 hours. CBG: No results for input(s): GLUCAP in the last 168 hours. Lipid Profile: No results for input(s): CHOL, HDL, LDLCALC, TRIG, CHOLHDL, LDLDIRECT in the last 72 hours. Thyroid Function Tests: No results for input(s): TSH, T4TOTAL, FREET4, T3FREE, THYROIDAB in the last 72 hours. Anemia Panel: No results for input(s): VITAMINB12, FOLATE, FERRITIN, TIBC, IRON, RETICCTPCT in the last 72 hours. Sepsis Labs: No results for input(s): PROCALCITON, LATICACIDVEN in the last 168 hours.  Recent Results (from the past 240 hour(s))  Urine culture     Status: Abnormal   Collection Time: 08/14/20  5:20 AM   Specimen: Urine, Random  Result Value Ref Range Status   Specimen Description URINE, RANDOM  Final   Special  Requests NONE  Final   Culture (A)  Final    >=100,000 COLONIES/mL GROUP B STREP(S.AGALACTIAE)ISOLATED TESTING AGAINST S. AGALACTIAE NOT ROUTINELY PERFORMED DUE TO PREDICTABILITY OF AMP/PEN/VAN SUSCEPTIBILITY. Performed at [facility], [place] [address], [place], [place] [address]    Report Status 08/15/2020 FINAL  Final  Resp Panel by RT-PCR (Flu A&B, Covid) Nasopharyngeal Swab     Status: None   Collection Time: 08/14/20  6:00 AM   Specimen: Nasopharyngeal Swab; Nasopharyngeal(NP) swabs in vial transport medium  Result Value Ref Range Status   SARS Coronavirus 2 by RT PCR NEGATIVE NEGATIVE Final    Comment: (NOTE) SARS-CoV-2 target nucleic acids are NOT DETECTED.  The SARS-CoV-2 RNA is generally detectable in upper respiratory specimens during the acute phase of infection. The lowest concentration of SARS-CoV-2 viral copies this assay can detect is 138 copies/mL. A negative result does not preclude SARS-Cov-2 infection and should not be used as the sole basis for treatment or other patient management decisions. A negative result may occur with  improper specimen collection/handling, submission of specimen  other than nasopharyngeal swab, presence of viral mutation(s) within the areas targeted by this assay, and inadequate number of viral copies(<138 copies/mL). A negative result must be combined with clinical observations, patient history, and epidemiological information. The expected result is Negative.  Fact Sheet for Patients:  EntrepreneurPulse.com.au  Fact Sheet for Healthcare Providers:  IncredibleEmployment.be  This test is no t yet approved or cleared by the [place] FDA and  has been authorized for detection and/or diagnosis of SARS-CoV-2 by FDA under an Emergency Use Authorization (EUA). This EUA will remain  in effect (meaning this test can be used) for the duration of the COVID-19 declaration under Section 564(b)(1) of the Act, 21 U.S.C.section 360bbb-3(b)(1), unless the authorization is terminated  or revoked sooner.       Influenza A by PCR NEGATIVE NEGATIVE Final   Influenza B by PCR NEGATIVE NEGATIVE Final    Comment: (NOTE) The Xpert Xpress SARS-CoV-2/FLU/RSV plus assay is intended as an aid in the diagnosis of influenza from Nasopharyngeal swab specimens and should not be used as a sole basis for treatment. Nasal washings and aspirates are unacceptable for Xpert Xpress SARS-CoV-2/FLU/RSV testing.  Fact Sheet for Patients: EntrepreneurPulse.com.au  Fact Sheet for Healthcare Providers: IncredibleEmployment.be  This test is not yet approved or cleared by the [place] FDA and has been authorized for detection and/or diagnosis of SARS-CoV-2 by FDA under an Emergency Use Authorization (EUA). This EUA will remain in effect (meaning this test can be used) for the duration of the COVID-19 declaration under Section 564(b)(1) of the Act, 21 U.S.C. section 360bbb-3(b)(1), unless the authorization is terminated or revoked.  Performed at [facility], [place] [address], [place],  [place] [address]   Blood culture (routine x 2)     Status: None   Collection Time: 08/14/20  6:32 AM   Specimen: BLOOD  Result Value Ref Range Status   Specimen Description BLOOD SITE NOT SPECIFIED  Final   Special Requests   Final    BOTTLES DRAWN AEROBIC AND ANAEROBIC Blood Culture adequate volume   Culture   Final    NO GROWTH 5 DAYS Performed at [facility], [address] [place], [place] [address]    Report Status 08/19/2020 FINAL  Final  Blood culture (routine x 2)     Status: None   Collection Time: 08/14/20 10:10 AM   Specimen: BLOOD LEFT HAND  Result Value Ref Range Status   Specimen Description BLOOD LEFT HAND  Final   Special Requests   Final    BOTTLES DRAWN AEROBIC AND ANAEROBIC Blood Culture adequate volume   Culture   Final    NO GROWTH 5 DAYS Performed at [facility], [address], [place], [place] [address]    Report Status 08/19/2020 FINAL  Final     Scheduled Meds: . sodium chloride   Intravenous Once  . amoxicillin-clavulanate  1 tablet Oral BID  . Chlorhexidine Gluconate Cloth  6 each Topical Q0600  . darbepoetin (ARANESP) injection - NON-DIALYSIS  40 mcg Subcutaneous Q Sat-1800  . folic acid  1 mg Intravenous Daily  . pantoprazole (PROTONIX) IV  40 mg Intravenous Q12H  . sodium chloride flush  3 mL Intravenous Q12H   Continuous Infusions:    LOS: 7 days   Time spent: 25 minutes.   Domenic Polite, MD [facility]  08/21/2020, 12:08 PM

## 2020-08-21 NOTE — Progress Notes (Signed)
PHARMACY CONSULT FOR ARANESP  Marie Brewer is 40 yo female admitted with acute on chronic anemia.  Heme/onc is following and workup for MM was negative.  Plan for patient follow up outpatient.  Pharmacy was consulted to dose Aranesp.   Hgb today 6.7 with plans to transfuse.  Baseline appears to be between 7-10. Anemia panel drawn 08/15/20: iron 58, TIBC 203, Tsat 29, Ferritin 495, Folate 3.6, B12 527  Based on our protocol, the patient will be given Aranesp 40 mcg SQ every 7 days (based on weight between 60-100kg).    Pharmacy will continue to monitor daily hemoglobin and assess need for Aranesp if hemoglobin improves.  Dimple Nanas, PharmD PGY-1 Acute Care Pharmacy Resident Office: [phone] 08/21/2020 7:57 AM

## 2020-08-22 LAB — BASIC METABOLIC PANEL
Anion gap: 16 — ABNORMAL HIGH (ref 5–15)
BUN: 50 mg/dL — ABNORMAL HIGH (ref 6–20)
CO2: 11 mmol/L — ABNORMAL LOW (ref 22–32)
Calcium: 8.2 mg/dL — ABNORMAL LOW (ref 8.9–10.3)
Chloride: 108 mmol/L (ref 98–111)
Creatinine, Ser: 4.56 mg/dL — ABNORMAL HIGH (ref 0.44–1.00)
GFR, Estimated: 12 mL/min — ABNORMAL LOW (ref >60)
Glucose, Bld: 77 mg/dL (ref 70–99)
Potassium: 5 mmol/L (ref 3.5–5.1)
Sodium: 135 mmol/L (ref 135–145)

## 2020-08-22 LAB — CBC
HCT: 27.7 % — ABNORMAL LOW (ref 36.0–46.0)
Hemoglobin: 9 g/dL — ABNORMAL LOW (ref 12.0–15.0)
MCH: 30.5 pg (ref 26.0–34.0)
MCHC: 32.5 g/dL (ref 30.0–36.0)
MCV: 93.9 fL (ref 80.0–100.0)
Platelets: 158 10*3/uL (ref 150–400)
RBC: 2.95 MIL/uL — ABNORMAL LOW (ref 3.87–5.11)
RDW: 15.2 % (ref 11.5–15.5)
WBC: 13 10*3/uL — ABNORMAL HIGH (ref 4.0–10.5)
nRBC: 0 % (ref 0.0–0.2)

## 2020-08-22 LAB — TYPE AND SCREEN
ABO/RH(D): O NEG
Antibody Screen: NEGATIVE
Unit division: 0

## 2020-08-22 LAB — BPAM RBC
Blood Product Expiration Date: 202201262359
ISSUE DATE / TIME: 202201221113
Unit Type and Rh: 9500

## 2020-08-22 LAB — PREPARE RBC (CROSSMATCH)

## 2020-08-22 MED ORDER — PROMETHAZINE HCL 6.25 MG/5ML PO SYRP: 6.2500 mg | ORAL_SOLUTION | Freq: Three times a day (TID) | ORAL | 0 refills | Status: DC | PRN

## 2020-08-22 MED ORDER — OMEPRAZOLE 40 MG PO CPDR: 40.0000 mg | DELAYED_RELEASE_CAPSULE | Freq: Every day | ORAL | 1 refills | Status: DC

## 2020-08-22 MED ORDER — FOLIC ACID 1 MG PO TABS: 1.0000 mg | ORAL_TABLET | Freq: Every day | ORAL | 3 refills | Status: AC

## 2020-08-22 MED ORDER — ACETAMINOPHEN 325 MG PO TABS: 650.0000 mg | ORAL_TABLET | Freq: Four times a day (QID) | ORAL | Status: DC | PRN

## 2020-08-22 NOTE — Progress Notes (Signed)
DISCHARGE NOTE HOME Marie Brewer to be discharged Home per MD order. Discussed prescriptions and follow up appointments with the patient. Prescriptions given to patient; medication list explained in detail. Patient verbalized understanding.  Skin clean, dry and intact without evidence of skin break down, no evidence of skin tears noted. IV catheter discontinued intact. Site without signs and symptoms of complications. Dressing and pressure applied. Pt denies pain at the site currently. No complaints noted.  Patient free of lines, drains, and wounds.   An After Visit Summary (AVS) was printed and given to the patient. Patient escorted via wheelchair, and discharged home via private auto.  Berneta Levins, RN

## 2020-08-22 NOTE — Care Management (Signed)
Discussed Good Rx with patient, reviewed cost prescriptions. Texted app to her cell phone for convenience. No other CM needs identified.

## 2020-08-22 NOTE — Discharge Summary (Signed)
Physician Discharge Summary  Marie Brewer ZOX:[id number] DOB: 12/25/1980 DOA: 08/14/2020  PCP: Pcp, No  Admit date: 08/14/2020 Discharge date: 08/22/2020  Time spent: 35 minutes  Recommendations for Outpatient Follow-up:  [facility] on 2/10, please check BMP at follow-up Dr. Burr Medico at [facility] for chronic anemia Dr. Gilford Rile urology for ureteral stents Needs MRI of pancreas in 1 month to reevaluate abnormal attenuation seen on CT   Discharge Diagnoses:  Acute on chronic anemia Acute kidney injury Bilateral hydronephrosis Bilateral ureteral strictures History of uterine, cervical cancer Pneumomediastinum Positive hep C antibody Abnormal attenuation of the pancreas on CT   Polysubstance abuse ([place])   ARF (acute renal failure) ([place])   Acute renal failure (ARF) ([facility])   Anemia, unspecified   Anemia   Discharge Condition: Stable  Diet recommendation: Low-sodium  Filed Weights   08/17/20 0419 08/19/20 2110  Weight: 76.9 kg 77 kg    History of present illness:  40/F with history of polysubstance abuse including prior heroin use, remote h/o history of cervical/uterine cancer treated with surgery and radiation, chronic anemia  started experiencing nausea and vomiting over the past month -Presented to the ED on 1/15 was noted to be severely anemic with a hemoglobin of 5 and a creatinine of 10, on further work-up she was noted to have bilateral hydronephrosis and a pneumomediastinum  Hospital Course:   Acute on chronic anemia  -Anemia panel suggestive of chronic disease, also noted to have folic acid deficiency on supplements now  -Myeloma panel  noted polyclonal increase in immunoglobulins  -Transfused 5 units of PRBC this admission -Folic acid was low at 3.6, started replacement -seen by hematology in consultation recommended outpatient follow-up and may pursue bone marrow aspiration and biopsy as indicated -Hemoglobin improved to 8-9 range at  discharge  Acute Renal failure; bilateral hydronephrosis -Admitted with creatinine of 10, baseline is 1.3 -Imaging noted bilateral hydronephrosis, this is felt to be obstructive from bilateral ureteral strictures, prior history of pelvic XRT for uterine/cervical cancer -Seen by urology in consultation, underwent bilateral double-J stent placement on 1/16, urine output improving, creatinine slowly improving -Creatinine improving down to 4.5 today, -Off IV fluids, needs BMP checked in 1 week, follow-up with  wellness clinic and urology Dr. Gilford Rile  Pneumomediastinum; -Incidental finding on CT renal protocol. -CT chest and abdomen: Moderate amount of pneumomediastinum throughout the chest.  Not definitive focal esophageal injury  -Started on empiric IV meropenem, bowel rest and T CTS was consulted -Repeat CT chest with oral Gastrografin contrast did not show any leak, pneumomediastinum is improving, advanced to soft diet, and transitioned to oral Augmentin, completed course  Abnormal attenuation pancreas on CT;  -Patient will need MRI down the road to rule out pancreatic Mass.   Positive hep C antibody -Follow-up with infectious disease  Hyponatremia -Resolved  Hypokalemia -Replaced  Hypomagnesemia;  -Repleted  UTI; treated with IV meropenem primarily for pneumomediastinum Urine culture growing strep agalactiae.  -Transitioned to Augmentin for 3 days, then discontinued   Left LE wound; -Continue local wound care  Procedure(s): 1. Cystoscopy with bilateral ureteral stent placement 2. Bilateral retrograde pyelograms with intraoperative interpretation of fluoroscopic imaging  Consultations:  Urology Dr. Gilford Rile  T CTS Dr. Roxy Manns  Discharge Exam: Vitals:   08/22/20 0413 08/22/20 1036  BP: 135/82 (!) 153/84  Pulse: 87 85  Resp:  16  Temp: 98.7 F (37.1 C) 98 F (36.7 C)  SpO2: 100% 100%    General: AOx3, no distress Cardiovascular:  S1-S2, regular  rate rhythm Respiratory: Clear  Discharge Instructions   Discharge Instructions    Change dressing (specify)   Complete by: As directed    Wound care to left lateral LE wounds, full thickness: Cleanse with NS, pat dry. Cover with size appropriate piece of xeroform gauze Kellie Simmering # 294), top with dry gauze and secure with silicone foam. Change xeroform gauze daily, may reuse foam for up to 3 days.  Change PRN for rolling of dressing edges or drainage strike-through.   Increase activity slowly   Complete by: As directed      Allergies as of 08/22/2020      Reactions   Imitrex [sumatriptan Base] Other (See Comments)   Heart races   Cephalexin Rash   Patient passed amoxicillin challenge on 08/18/20, no adverse effect      Medication List    TAKE these medications   acetaminophen 325 MG tablet Commonly known as: TYLENOL Take 2 tablets (650 mg total) by mouth every 6 (six) hours as needed for mild pain, fever or headache.   folic acid 1 MG tablet Commonly known as: FOLVITE Take 1 tablet (1 mg total) by mouth daily.   omeprazole 40 MG capsule Commonly known as: PRILOSEC Take 1 capsule (40 mg total) by mouth daily.   promethazine 6.25 MG/5ML syrup Commonly known as: PHENERGAN Take 5 mLs (6.25 mg total) by mouth every 8 (eight) hours as needed for up to 10 days for nausea or vomiting.            Discharge Care Instructions  (From admission, onward)         Start     Ordered   08/22/20 0000  Change dressing (specify)       Comments: Wound care to left lateral LE wounds, full thickness: Cleanse with NS, pat dry. Cover with size appropriate piece of xeroform gauze Kellie Simmering # 294), top with dry gauze and secure with silicone foam. Change xeroform gauze daily, may reuse foam for up to 3 days.  Change PRN for rolling of dressing edges or drainage strike-through.   08/22/20 0935         Allergies  Allergen Reactions  . Imitrex [Sumatriptan Base] Other (See Comments)    Heart  races  . Cephalexin Rash    Patient passed amoxicillin challenge on 08/18/20, no adverse effect    Follow-up Information    Winter, Christopher Aaron, MD. Schedule an appointment as soon as possible for a visit in 1 month.   Specialty: Urology Contact information: [address] [place] [address] [phone]        [facility]. Go on 09/09/2020.   Why: at 1:50pm with Freeman Caldron, NP for hospital follow-up  You can use their low cost pharmacy at this location. Contact information: [place] [address] [phone]       Truitt Merle, MD Follow up in 2 week(s).   Specialties: Hematology, Oncology Contact information: [place] [address] [phone]                The results of significant diagnostics from this hospitalization (including imaging, microbiology, ancillary and laboratory) are listed below for reference.    Significant Diagnostic Studies: CT ABDOMEN WO CONTRAST  Result Date: 08/18/2020 CLINICAL DATA:  Esophageal perforation, history of vomiting for 1 month. Pneumomediastinum. EXAM: CT CHEST AND ABDOMEN WITHOUT CONTRAST TECHNIQUE: Multidetector CT imaging of the chest and abdomen  was performed following the standard protocol without intravenous contrast. COMPARISON:  08/15/2020. FINDINGS: CT CHEST FINDINGS WITHOUT CONTRAST Cardiovascular: Atherosclerotic calcification of the aorta. Heart size normal. No pericardial effusion. Mediastinum/Nodes: Persistent but improving pneumomediastinum. Subcutaneous emphysema extends into the neck, left greater than right. No pathologically enlarged mediastinal or axillary lymph nodes. Hilar regions are difficult to definitively evaluate without IV contrast. There is oral contrast in the lower esophagus. Esophagus is otherwise unremarkable. Lungs/Pleura: Mild scarring and a small cyst in the apical segment right upper lobe.  Minimal subpleural nodularity in the left lower lobe, likely benign subpleural lymph nodes and or scarring. Lungs are otherwise clear. No pleural fluid. Debris is seen in the upper trachea. Musculoskeletal: None. CT ABDOMEN FINDINGS WITHOUT CONTRAST Hepatobiliary: Liver is unremarkable. Cholecystectomy. No abnormal biliary ductal dilatation. Pancreas: Heterogeneous low-attenuation described in the pancreatic head on the prior exam may be due to fat in the ventral anlage. Otherwise negative. Spleen: Negative. Adrenals/Urinary Tract: Adrenal glands are unremarkable. Subcapsular fluid along the upper pole right kidney. Also likely 2.3 cm low-attenuation lesion in the upper pole right kidney, difficult to further characterize without post-contrast imaging. Moderate bilateral hydronephrosis with new bilateral double-J ureteral stents in place, partially imaged. Stomach/Bowel: Small hiatal hernia. Stomach and visualized portions of the small bowel and colon are otherwise unremarkable. Vascular/Lymphatic: Vascular structures are unremarkable. No pathologically enlarged lymph nodes. Other: No free fluid.  Mesenteries and peritoneum are unremarkable. Musculoskeletal: No worrisome lytic or sclerotic lesions. IMPRESSION: 1. Persistent but slightly improved pneumomediastinum, consistent with the given history of esophageal perforation. 2. Bilateral hydronephrosis with double-J ureteral stents in place. 3.  Aortic atherosclerosis (ICD10-I70.0). Electronically Signed   By: Lorin Picket M.D.   On: 08/18/2020 11:28   CT ABDOMEN WO CONTRAST  Result Date: 08/15/2020 CLINICAL DATA:  Possible esophageal perforation. Pneumomediastinum on recent abdominal CT. Nausea and vomiting. Acute on chronic anemia. History of heroin abuse. EXAM: CT CHEST AND ABDOMEN WITHOUT CONTRAST TECHNIQUE: Multidetector CT imaging of the chest and abdomen was performed following the standard protocol without intravenous contrast. Patient was given 50 mL  Omnipaque 350 dilute and 12 ounces of water orally immediately before scanning. COMPARISON:  Noncontrast abdominopelvic CT earlier today as well as previous abdominal CT 11/13/2017 FINDINGS: CT CHEST FINDINGS WITHOUT CONTRAST Cardiovascular: Mild cardiomegaly. Thoracic aorta is otherwise unremarkable. Remaining vascular structures are unremarkable. Mediastinum/Nodes: Evidence of moderate pneumomediastinum throughout the chest. As this is visualized on the most superior image at the neck base extends through the chest to the diaphragm. Minimal cervical wall thickening throughout the esophagus. Contrast is present throughout the course of the esophagus without evidence of extravasation of contrast. No definite focal esophageal injury is identified. No mediastinal or hilar adenopathy. Remaining mediastinal structures are unremarkable. Lungs/Pleura: Lungs are well inflated without focal airspace consolidation or effusion. No pneumothorax. Small thin-walled cysts over the medial right apex. Focal debris over the dependent portion of the upper trachea. Airways are normal. Musculoskeletal: No acute findings. CT ABDOMEN FINDINGS WITHOUT CONTRAST Hepatobiliary: Previous cholecystectomy. Liver and biliary tree are unremarkable. Pancreas: No change in recently seen somewhat heterogeneous low-attenuation over the pancreatic head stable compared to the previous exam from 2019. No perinephric inflammation or fluid. Spleen: Normal. Adrenals/Urinary Tract: Adrenal glands are normal. Kidneys are normal in size with mild bilateral hydronephrosis unchanged. Subcapsular low-attenuation over the upper pole right kidney unchanged. Minimal left perinephric fluid unchanged. No renal stones. Visualized ureters are unremarkable. Stomach/Bowel: Contrast within the stomach which is otherwise unremarkable. Visualized bowel is unremarkable.  Vascular/Lymphatic: Abdominal aorta is normal caliber. Minimal calcified plaque over the proximal  abdominal aorta. No adenopathy. Other: No other changes. Musculoskeletal: No acute findings. IMPRESSION: 1. Moderate amount of pneumomediastinum throughout the chest. No definite focal esophageal injury identified. No definite etiology visualized for this pneumomediastinum. 2. Subcapsular low-density collection over the upper pole right kidney with bilateral hydronephrosis as these findings are stable. Findings could be seen due to infection. Recommend clinical correlation. 3. Moderate focal dependent debris over the upper trachea which may be due to aspiration versus posttraumatic. 4. Mild heterogeneous low-attenuation over the pancreatic head stable compared to the previous exam from 2019. Pancreatic mass is possible. Continue to recommend further evaluation on elective basis with MRI. 5. Aortic atherosclerosis. Aortic Atherosclerosis (ICD10-I70.0). Electronically Signed   By: Marin Olp M.D.   On: 08/15/2020 12:17   CT CHEST WO CONTRAST  Result Date: 08/18/2020 CLINICAL DATA:  Esophageal perforation, history of vomiting for 1 month. Pneumomediastinum. EXAM: CT CHEST AND ABDOMEN WITHOUT CONTRAST TECHNIQUE: Multidetector CT imaging of the chest and abdomen was performed following the standard protocol without intravenous contrast. COMPARISON:  08/15/2020. FINDINGS: CT CHEST FINDINGS WITHOUT CONTRAST Cardiovascular: Atherosclerotic calcification of the aorta. Heart size normal. No pericardial effusion. Mediastinum/Nodes: Persistent but improving pneumomediastinum. Subcutaneous emphysema extends into the neck, left greater than right. No pathologically enlarged mediastinal or axillary lymph nodes. Hilar regions are difficult to definitively evaluate without IV contrast. There is oral contrast in the lower esophagus. Esophagus is otherwise unremarkable. Lungs/Pleura: Mild scarring and a small cyst in the apical segment right upper lobe. Minimal subpleural nodularity in the left lower lobe, likely benign  subpleural lymph nodes and or scarring. Lungs are otherwise clear. No pleural fluid. Debris is seen in the upper trachea. Musculoskeletal: None. CT ABDOMEN FINDINGS WITHOUT CONTRAST Hepatobiliary: Liver is unremarkable. Cholecystectomy. No abnormal biliary ductal dilatation. Pancreas: Heterogeneous low-attenuation described in the pancreatic head on the prior exam may be due to fat in the ventral anlage. Otherwise negative. Spleen: Negative. Adrenals/Urinary Tract: Adrenal glands are unremarkable. Subcapsular fluid along the upper pole right kidney. Also likely 2.3 cm low-attenuation lesion in the upper pole right kidney, difficult to further characterize without post-contrast imaging. Moderate bilateral hydronephrosis with new bilateral double-J ureteral stents in place, partially imaged. Stomach/Bowel: Small hiatal hernia. Stomach and visualized portions of the small bowel and colon are otherwise unremarkable. Vascular/Lymphatic: Vascular structures are unremarkable. No pathologically enlarged lymph nodes. Other: No free fluid.  Mesenteries and peritoneum are unremarkable. Musculoskeletal: No worrisome lytic or sclerotic lesions. IMPRESSION: 1. Persistent but slightly improved pneumomediastinum, consistent with the given history of esophageal perforation. 2. Bilateral hydronephrosis with double-J ureteral stents in place. 3.  Aortic atherosclerosis (ICD10-I70.0). Electronically Signed   By: Lorin Picket M.D.   On: 08/18/2020 11:28   CT CHEST WO CONTRAST  Result Date: 08/15/2020 CLINICAL DATA:  Possible esophageal perforation. Pneumomediastinum on recent abdominal CT. Nausea and vomiting. Acute on chronic anemia. History of heroin abuse. EXAM: CT CHEST AND ABDOMEN WITHOUT CONTRAST TECHNIQUE: Multidetector CT imaging of the chest and abdomen was performed following the standard protocol without intravenous contrast. Patient was given 50 mL Omnipaque 350 dilute and 12 ounces of water orally immediately before  scanning. COMPARISON:  Noncontrast abdominopelvic CT earlier today as well as previous abdominal CT 11/13/2017 FINDINGS: CT CHEST FINDINGS WITHOUT CONTRAST Cardiovascular: Mild cardiomegaly. Thoracic aorta is otherwise unremarkable. Remaining vascular structures are unremarkable. Mediastinum/Nodes: Evidence of moderate pneumomediastinum throughout the chest. As this is visualized on the most superior image  at the neck base extends through the chest to the diaphragm. Minimal cervical wall thickening throughout the esophagus. Contrast is present throughout the course of the esophagus without evidence of extravasation of contrast. No definite focal esophageal injury is identified. No mediastinal or hilar adenopathy. Remaining mediastinal structures are unremarkable. Lungs/Pleura: Lungs are well inflated without focal airspace consolidation or effusion. No pneumothorax. Small thin-walled cysts over the medial right apex. Focal debris over the dependent portion of the upper trachea. Airways are normal. Musculoskeletal: No acute findings. CT ABDOMEN FINDINGS WITHOUT CONTRAST Hepatobiliary: Previous cholecystectomy. Liver and biliary tree are unremarkable. Pancreas: No change in recently seen somewhat heterogeneous low-attenuation over the pancreatic head stable compared to the previous exam from 2019. No perinephric inflammation or fluid. Spleen: Normal. Adrenals/Urinary Tract: Adrenal glands are normal. Kidneys are normal in size with mild bilateral hydronephrosis unchanged. Subcapsular low-attenuation over the upper pole right kidney unchanged. Minimal left perinephric fluid unchanged. No renal stones. Visualized ureters are unremarkable. Stomach/Bowel: Contrast within the stomach which is otherwise unremarkable. Visualized bowel is unremarkable. Vascular/Lymphatic: Abdominal aorta is normal caliber. Minimal calcified plaque over the proximal abdominal aorta. No adenopathy. Other: No other changes. Musculoskeletal: No  acute findings. IMPRESSION: 1. Moderate amount of pneumomediastinum throughout the chest. No definite focal esophageal injury identified. No definite etiology visualized for this pneumomediastinum. 2. Subcapsular low-density collection over the upper pole right kidney with bilateral hydronephrosis as these findings are stable. Findings could be seen due to infection. Recommend clinical correlation. 3. Moderate focal dependent debris over the upper trachea which may be due to aspiration versus posttraumatic. 4. Mild heterogeneous low-attenuation over the pancreatic head stable compared to the previous exam from 2019. Pancreatic mass is possible. Continue to recommend further evaluation on elective basis with MRI. 5. Aortic atherosclerosis. Aortic Atherosclerosis (ICD10-I70.0). Electronically Signed   By: Marin Olp M.D.   On: 08/15/2020 12:17   DG Retrograde Pyelogram  Result Date: 08/15/2020 CLINICAL DATA:  40 year old with bilateral hydronephrosis. EXAM: RETROGRADE PYELOGRAM COMPARISON:  CT abdomen 08/15/2018 FLUOROSCOPY TIME:  56.9 seconds FINDINGS: Right retrograde exam demonstrates opacification of the right ureter with moderate dilatation of the right renal pelvis and right renal calices. Evidence for contrast in the duodenum or small bowel loops in the right upper abdomen. Right double-J ureter stent was placed. Opacification of the left ureter. Incomplete evaluation of the proximal left ureter. Moderate dilatation of left renal calices. Placement of left ureter stent. IMPRESSION: Bilateral hydronephrosis and placement of bilateral ureter stents. Electronically Signed   By: Markus Daft M.D.   On: 08/15/2020 13:53   US Renal  Result Date: 08/14/2020 CLINICAL DATA:  40 year old female with acute renal failure. EXAM: RENAL / URINARY TRACT ULTRASOUND COMPLETE COMPARISON:  CT Abdomen and Pelvis 11/13/2017. FINDINGS: Right Kidney: Renal measurements: 11.1 x 5.0 x 5.7 cm = volume: 166 mL. Echogenic right  renal cortex (image 4). Mild to moderate right hydronephrosis (image 9). No right renal mass or definite cyst. Left Kidney: Renal measurements: Left kidney is difficult to visualize and might be obscured by chronic renal cortical insufficiency (that measured on image 12 might not entirely be renal parenchyma (12 cm in length). No left hydronephrosis. No definite left renal mass. Bladder: Diminutive, unremarkable. Other: IMPRESSION: 1. Echogenic right kidney suggesting chronic medical renal disease but with superimposed mild to moderate right hydronephrosis. 2. No left hydronephrosis although the left kidney is poorly delineated by ultrasound. Possible left renal atrophy. 3. Follow-up noncontrast CT Abdomen and Pelvis recommended. Electronically Signed  By: Genevie Ann M.D.   On: 08/14/2020 07:26   DG Bone Survey Met  Result Date: 08/16/2020 CLINICAL DATA:  Anemia.  History of cervical cancer. EXAM: METASTATIC BONE SURVEY COMPARISON:  None. FINDINGS: No lytic or sclerotic lesion is seen involving the visualized skeleton, including the spine, pelvis, rib cage or extremities. Mild degenerative disc disease is noted at C5-6 and C6-7. IMPRESSION: No significant abnormality seen involving the visualized skeleton. Electronically Signed   By: Marijo Conception M.D.   On: 08/16/2020 08:11   CT RENAL STONE STUDY  Addendum Date: 08/15/2020   ADDENDUM REPORT: 08/15/2020 10:57 ADDENDUM: These results were called by telephone at the time of interpretation on 08/15/2020 at 10:43 a.m. to provider [facility] , who verbally acknowledged these results. Electronically Signed   By: Marin Olp M.D.   On: 08/15/2020 10:57   Result Date: 08/15/2020 CLINICAL DATA:  Acute renal failure. Heroin abuse. Nausea and vomiting with acute on chronic anemia. EXAM: CT ABDOMEN AND PELVIS WITHOUT CONTRAST TECHNIQUE: Multidetector CT imaging of the abdomen and pelvis was performed following the standard protocol without IV contrast.  COMPARISON:  11/13/2017 FINDINGS: Lower chest: Lung bases are clear. There is evidence of pneumomediastinum with air seen adjacent the anterior heart and distal esophagus. Mild cervical true wall thickening of the distal esophagus. Hepatobiliary: Previous cholecystectomy. Liver and biliary tree are within normal. Pancreas: Subtle low-attenuation in the region of the pancreatic head without significant change. No adjacent inflammatory change or free fluid. Spleen: Normal. Adrenals/Urinary Tract: Adrenal glands are normal. Kidneys are normal in size with bilateral mild hydronephrosis right greater than left. No nephrolithiasis. There is subcapsular low-attenuation over the upper pole right kidney. Subtle patchy appearance to the renal cortex bilaterally. These findings may be due to infection. There is minimal fluid over the left perinephric region. No evidence of ureteral stones. Bladder is normal. Stomach/Bowel: Stomach and small bowel are normal. Appendix is normal. Colon is normal. Vascular/Lymphatic: Abdominal aorta is normal in caliber. There is minimal calcified plaque over the abdominal aorta. No evidence of adenopathy. Reproductive: Normal. Other: No evidence of free fluid or focal inflammatory change. Musculoskeletal: Mild sclerosis along the sacroiliac joints. IMPRESSION: 1. Evidence of pneumomediastinum. Mild wall thickening of the distal esophagus. Recommend clinical correlation as findings may be due to patient's recent vomiting episodes. 2. Mild bilateral hydronephrosis right greater than left. No nephrolithiasis or ureteral stones. Subtle patchy appearance to the renal cortex bilaterally with subcapsular low-attenuation over the upper pole right kidney. Findings may be due to infection. Recommend clinical correlation and follow-up imaging as clinically indicated. 3. Subtle indeterminate low-attenuation in the region of the pancreatic head without significant change. Consider MRI for further evaluation  to exclude pancreatic mass. 4. Aortic atherosclerosis. Aortic Atherosclerosis (ICD10-I70.0). Electronically Signed: By: Marin Olp M.D. On: 08/15/2020 10:35    Microbiology: Recent Results (from the past 240 hour(s))  Urine culture     Status: Abnormal   Collection Time: 08/14/20  5:20 AM   Specimen: Urine, Random  Result Value Ref Range Status   Specimen Description URINE, RANDOM  Final   Special Requests NONE  Final   Culture (A)  Final    >=100,000 COLONIES/mL GROUP B STREP(S.AGALACTIAE)ISOLATED TESTING AGAINST S. AGALACTIAE NOT ROUTINELY PERFORMED DUE TO PREDICTABILITY OF AMP/PEN/VAN SUSCEPTIBILITY. Performed at [facility], [place] [address], [place], [place] [address]    Report Status 08/15/2020 FINAL  Final  Resp Panel by RT-PCR (Flu A&B, Covid) Nasopharyngeal Swab  Status: None   Collection Time: 08/14/20  6:00 AM   Specimen: Nasopharyngeal Swab; Nasopharyngeal(NP) swabs in vial transport medium  Result Value Ref Range Status   SARS Coronavirus 2 by RT PCR NEGATIVE NEGATIVE Final    Comment: (NOTE) SARS-CoV-2 target nucleic acids are NOT DETECTED.  The SARS-CoV-2 RNA is generally detectable in upper respiratory specimens during the acute phase of infection. The lowest concentration of SARS-CoV-2 viral copies this assay can detect is 138 copies/mL. A negative result does not preclude SARS-Cov-2 infection and should not be used as the sole basis for treatment or other patient management decisions. A negative result may occur with  improper specimen collection/handling, submission of specimen other than nasopharyngeal swab, presence of viral mutation(s) within the areas targeted by this assay, and inadequate number of viral copies(<138 copies/mL). A negative result must be combined with clinical observations, patient history, and epidemiological information. The expected result is Negative.  Fact Sheet for Patients:   EntrepreneurPulse.com.au  Fact Sheet for Healthcare Providers:  IncredibleEmployment.be  This test is no t yet approved or cleared by the [place] FDA and  has been authorized for detection and/or diagnosis of SARS-CoV-2 by FDA under an Emergency Use Authorization (EUA). This EUA will remain  in effect (meaning this test can be used) for the duration of the COVID-19 declaration under Section 564(b)(1) of the Act, 21 U.S.C.section 360bbb-3(b)(1), unless the authorization is terminated  or revoked sooner.       Influenza A by PCR NEGATIVE NEGATIVE Final   Influenza B by PCR NEGATIVE NEGATIVE Final    Comment: (NOTE) The Xpert Xpress SARS-CoV-2/FLU/RSV plus assay is intended as an aid in the diagnosis of influenza from Nasopharyngeal swab specimens and should not be used as a sole basis for treatment. Nasal washings and aspirates are unacceptable for Xpert Xpress SARS-CoV-2/FLU/RSV testing.  Fact Sheet for Patients: EntrepreneurPulse.com.au  Fact Sheet for Healthcare Providers: IncredibleEmployment.be  This test is not yet approved or cleared by the [place] FDA and has been authorized for detection and/or diagnosis of SARS-CoV-2 by FDA under an Emergency Use Authorization (EUA). This EUA will remain in effect (meaning this test can be used) for the duration of the COVID-19 declaration under Section 564(b)(1) of the Act, 21 U.S.C. section 360bbb-3(b)(1), unless the authorization is terminated or revoked.  Performed at [facility], [place] [address], [place], [place] [address]   Blood culture (routine x 2)     Status: None   Collection Time: 08/14/20  6:32 AM   Specimen: BLOOD  Result Value Ref Range Status   Specimen Description BLOOD SITE NOT SPECIFIED  Final   Special Requests   Final    BOTTLES DRAWN AEROBIC AND ANAEROBIC Blood Culture adequate volume   Culture   Final    NO GROWTH 5  DAYS Performed at [facility], [address], [place], [place] [address]    Report Status 08/19/2020 FINAL  Final  Blood culture (routine x 2)     Status: None   Collection Time: 08/14/20 10:10 AM   Specimen: BLOOD LEFT HAND  Result Value Ref Range Status   Specimen Description BLOOD LEFT HAND  Final   Special Requests   Final    BOTTLES DRAWN AEROBIC AND ANAEROBIC Blood Culture adequate volume   Culture   Final    NO GROWTH 5 DAYS Performed at [facility], [place] [address], [place], [place] [address]    Report Status 08/19/2020 FINAL  Final     Labs: Basic Metabolic Panel: Recent Labs  Lab 08/16/20 0328 08/17/20 0856 08/19/20 1006 08/20/20 0409 08/21/20 0351 08/22/20 0304  NA 132* 135 136 135 134* 135  K 2.9* 3.1* 2.7* 3.3* 3.8 5.0  CL 97* 99 104 105 104 108  CO2 19* 19* 18* 20* 19* 11*  GLUCOSE 173* 77 107* 87 96 77  BUN 97* 93* 67* 60* 51* 50*  CREATININE 8.64* 7.65* 5.85* 5.55* 4.89* 4.56*  CALCIUM 8.2* 8.4* 8.4* 8.1* 8.2* 8.2*  MG 1.7  --   --   --   --   --   PHOS 5.9*  --   --   --   --   --    Liver Function Tests: Recent Labs  Lab 08/16/20 0328  ALBUMIN 2.2*   No results for input(s): LIPASE, AMYLASE in the last 168 hours. No results for input(s): AMMONIA in the last 168 hours. CBC: Recent Labs  Lab 08/16/20 0328 08/17/20 0856 08/19/20 1006 08/20/20 0409 08/21/20 0351 08/21/20 2148 08/22/20 0304  WBC 7.9 8.6 9.3 8.8 10.1  --  13.0*  NEUTROABS 7.1  --   --   --   --   --   --   HGB 7.4* 8.4* 7.8* 7.8* 6.7* 8.0* 9.0*  HCT 22.9* 24.9* 23.1* 23.3* 21.5* 25.0* 27.7*  MCV 89.5 87.4 88.5 88.9 91.5  --  93.9  PLT 277 202 230 220 194  --  158   Cardiac Enzymes: No results for input(s): CKTOTAL, CKMB, CKMBINDEX, TROPONINI in the last 168 hours. BNP: BNP (last 3 results) No results for input(s): BNP in the last 8760 hours.  ProBNP (last 3 results) No results for input(s): PROBNP in the last 8760 hours.  CBG: No results for input(s):  GLUCAP in the last 168 hours.     Signed:  Domenic Polite MD.  [facility] 08/22/2020, 2:22 PM

## 2020-08-25 LAB — UPEP/UIFE/LIGHT CHAINS/TP, 24-HR UR
% BETA, Urine: 28.7 %
ALPHA 1 URINE: 8.4 %
Albumin, U: 21.1 %
Alpha 2, Urine: 20.1 %
Free Kappa Lt Chains,Ur: 307.77 mg/L — ABNORMAL HIGH (ref 0.63–113.79)
Free Kappa/Lambda Ratio: 1.84 (ref 1.03–31.76)
Free Lambda Lt Chains,Ur: 167.43 mg/L — ABNORMAL HIGH (ref 0.47–11.77)
GAMMA GLOBULIN URINE: 21.7 %
Total Protein, Urine-Ur/day: 1069 mg/24 hr — ABNORMAL HIGH (ref 30–150)
Total Protein, Urine: 97.2 mg/dL
Total Volume: 1100

## 2020-09-01 ENCOUNTER — Inpatient Hospital Stay: Payer: MEDICAID | Attending: Hematology | Admitting: Nurse Practitioner

## 2020-09-01 ENCOUNTER — Inpatient Hospital Stay: Payer: MEDICAID

## 2020-09-01 NOTE — Progress Notes (Deleted)
[facility]   [phone] [phone]   Clinic Follow up Note   Patient Care Team: Pcp, No as PCP - General 09/01/2020  CHIEF COMPLAINT: Hospital follow up anemia  HPI: [name] was initially seen by Dr. Burr Medico inpatient 08/15/2020 for hospitalization for AKI Scr 10.4 and acute on chronic anemia Hg 6.4, previously hemoglobin ranged 7.7-8.9 per last ED visit in 04/2020. She received a blood transfusion and underwent CT which showed bilateral hydronephrosis s/p cystoscopy and bilateral J stent placement by Dr. Lovena Neighbours. Work up showed folic acid deficiency, no evidence of hemolysis, iron deficiency, or multiple myeloma. She has h/o B12 deficiency for which she is on supplement. 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 A bone marrow biopsy has not been recommended at this point. She was also treated for GBS (S.agalactiae) UTI.   INTERVAL HISTORY: [name] presents for first outpatient follow up.    REVIEW OF SYSTEMS:   Constitutional: Denies fevers, chills or abnormal weight loss Eyes: Denies blurriness of vision Ears, nose, mouth, throat, and face: Denies mucositis or sore throat Respiratory: Denies cough, dyspnea or wheezes Cardiovascular: Denies palpitation, chest discomfort or lower extremity swelling Gastrointestinal:  Denies nausea, heartburn or change in bowel habits Skin: Denies abnormal skin rashes Lymphatics: Denies new lymphadenopathy or easy bruising Neurological:Denies numbness, tingling or new weaknesses Behavioral/Psych: Mood is stable, no new changes  All other systems were reviewed with the patient and are negative.  MEDICAL HISTORY:  Past Medical History:  Diagnosis Date  . History of cervical cancer   . Migraine headache   . Polysubstance abuse ([place]) 05/05/2020    SURGICAL HISTORY: Past Surgical History:  Procedure Laterality Date  . CYSTOSCOPY W/ URETERAL STENT PLACEMENT Bilateral 08/15/2020   Procedure:  CYSTOSCOPY WITH RETROGRADE PYELOGRAM/URETERAL STENT PLACEMENT;  Surgeon: Ceasar Mons, MD;  Location: [facility];  Service: Urology;  Laterality: Bilateral;  . IRRIGATION AND DEBRIDEMENT ABSCESS Bilateral 05/05/2020   Procedure: left forearm incision and drainage and aspiration of the right wrist joint;  Surgeon: Cindra Presume, MD;  Location: [facility];  Service: Plastics;  Laterality: Bilateral;  . MANDIBLE FRACTURE SURGERY      I have reviewed the social history and family history with the patient and they are unchanged from previous note.  ALLERGIES:  is allergic to imitrex [sumatriptan base] and cephalexin.  MEDICATIONS:  Current Outpatient Medications  Medication Sig Dispense Refill  . acetaminophen (TYLENOL) 325 MG tablet Take 2 tablets (650 mg total) by mouth every 6 (six) hours as needed for mild pain, fever or headache.    . folic acid (FOLVITE) 1 MG tablet Take 1 tablet (1 mg total) by mouth daily. 30 tablet 3  . omeprazole (PRILOSEC) 40 MG capsule Take 1 capsule (40 mg total) by mouth daily. 60 capsule 1  . promethazine (PHENERGAN) 6.25 MG/5ML syrup Take 5 mLs (6.25 mg total) by mouth every 8 (eight) hours as needed for up to 10 days for nausea or vomiting. 120 mL 0   No current facility-administered medications for this visit.    PHYSICAL EXAMINATION: ECOG PERFORMANCE STATUS: {CHL ONC ECOG PS:[phone]}  There were no vitals filed for this visit. There were no vitals filed for this visit.  GENERAL:alert, no distress and comfortable SKIN: skin color, texture, turgor are normal, no rashes or significant lesions EYES: normal, Conjunctiva are pink and non-injected, sclera clear OROPHARYNX:no exudate, no erythema and lips, buccal mucosa, and tongue normal  NECK: supple,  thyroid normal size, non-tender, without nodularity LYMPH:  no palpable lymphadenopathy in the cervical, axillary or inguinal LUNGS: clear to auscultation and percussion with normal breathing  effort HEART: regular rate & rhythm and no murmurs and no lower extremity edema ABDOMEN:abdomen soft, non-tender and normal bowel sounds Musculoskeletal:no cyanosis of digits and no clubbing  NEURO: alert & oriented x 3 with fluent speech, no focal motor/sensory deficits  LABORATORY DATA:  I have reviewed the data as listed CBC Latest Ref Rng & Units 08/22/2020 08/21/2020 08/21/2020  WBC 4.0 - 10.5 K/uL 13.0(H) - 10.1  Hemoglobin 12.0 - 15.0 g/dL 9.0(L) 8.0(L) 6.7(LL)  Hematocrit 36.0 - 46.0 % 27.7(L) 25.0(L) 21.5(L)  Platelets 150 - 400 K/uL 158 - 194     CMP Latest Ref Rng & Units 08/22/2020 08/21/2020 08/20/2020  Glucose 70 - 99 mg/dL 77 96 87  BUN 6 - 20 mg/dL 50(H) 51(H) 60(H)  Creatinine 0.44 - 1.00 mg/dL 4.56(H) 4.89(H) 5.55(H)  Sodium 135 - 145 mmol/L 135 134(L) 135  Potassium 3.5 - 5.1 mmol/L 5.0 3.8 3.3(L)  Chloride 98 - 111 mmol/L 108 104 105  CO2 22 - 32 mmol/L 11(L) 19(L) 20(L)  Calcium 8.9 - 10.3 mg/dL 8.2(L) 8.2(L) 8.1(L)  Total Protein 6.5 - 8.1 g/dL - - -  Total Bilirubin 0.3 - 1.2 mg/dL - - -  Alkaline Phos 38 - 126 U/L - - -  AST 15 - 41 U/L - - -  ALT 0 - 44 U/L - - -      RADIOGRAPHIC STUDIES: I have personally reviewed the radiological images as listed and agreed with the findings in the report. No results found.   ASSESSMENT & PLAN:  No problem-specific Assessment & Plan notes found for this encounter.   No orders of the defined types were placed in this encounter.  All questions were answered. The patient knows to call the clinic with any problems, questions or concerns. No barriers to learning was detected. I spent {CHL ONC TIME VISIT - GYFVC:[id number]} counseling the patient face to face. The total time spent in the appointment was {CHL ONC TIME VISIT - BWGYK:[id number]} and more than 50% was on counseling and review of test results     Alla Feeling, NP 09/01/20

## 2020-09-02 ENCOUNTER — Telehealth: Payer: Self-pay | Admitting: Nurse Practitioner

## 2020-09-02 NOTE — Telephone Encounter (Signed)
Attempted to contact patient to reschedule missed appointment per 2/3 schedule message. Voicemail not set up. Mailed calendar with rescheduled appointment.

## 2020-09-07 ENCOUNTER — Other Ambulatory Visit: Payer: Self-pay | Admitting: Hematology

## 2020-09-07 DIAGNOSIS — D520 Dietary folate deficiency anemia: Secondary | ICD-10-CM

## 2020-09-08 ENCOUNTER — Inpatient Hospital Stay: Payer: MEDICAID

## 2020-09-08 ENCOUNTER — Inpatient Hospital Stay: Payer: MEDICAID | Admitting: Nurse Practitioner

## 2020-09-08 NOTE — Progress Notes (Deleted)
Patient ID: Marie Brewer, female   DOB: [birth year], 40 y.o.   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, chronic anemia  started experiencing nausea and vomiting over the past month -Presented to the ED on 1/15 was noted to be severely anemic with a hemoglobin of 5 and a creatinine of 10, on further work-up she was noted to have bilateral hydronephrosis and a pneumomediastinum  Hospital Course:   Acute on chronic anemia  -Anemia panel suggestive of chronic disease, also noted to have folic acid deficiency on supplements now  -Myeloma panel noted polyclonal increase in immunoglobulins  -Transfused 5 units of PRBC this admission -Folic acid was low at 3.6, started replacement -seen by hematology in consultation recommended outpatient follow-up and may pursue bone marrow aspiration and biopsy as indicated -Hemoglobin improved to 8-9 range at discharge  Acute Renal failure; bilateral hydronephrosis -Admitted with creatinine of 10, baseline is 1.3 -Imaging noted bilateral hydronephrosis, this is felt to be obstructive from bilateral ureteral strictures, prior history of pelvic XRT for uterine/cervical cancer -Seen by urology in consultation, underwent bilateral double-J stent placement on 1/16, urine output improving, creatinine slowly improving -Creatinine improving down to 4.5 today, -Off IV fluids, needs BMP checked in 1 week, follow-up with [facility] and urology Dr. Gilford Rile  Pneumomediastinum; -Incidental finding on CT renal protocol. -CT chest and abdomen: Moderate amount of pneumomediastinum throughout the chest. Not definitive focal esophageal injury  -Started on empiric IV meropenem, bowel rest and T CTS was consulted -Repeat CT chest with oral Gastrografin contrast did not show any leak, pneumomediastinum is  improving, advanced to soft diet, and transitionedto oral Augmentin, completed course  Abnormal attenuation pancreas on CT;  -Patient will need MRI down the road to rule out pancreatic Mass.   Positive hep C antibody -Follow-up with infectious disease  Hyponatremia -Resolved  Hypokalemia -Replaced  Hypomagnesemia;  -Repleted  UTI;treated with IV meropenem primarily for pneumomediastinum Urine culture growing strep agalactiae.  -Transitioned to Augmentin for 3 days, then discontinued   Left LE wound; -Continue local wound care  Procedure(s): 1.Cystoscopy with bilateral ureteral stent placement 2. Bilateral retrograde pyelograms with intraoperative interpretation of fluoroscopic imaging

## 2020-09-09 ENCOUNTER — Inpatient Hospital Stay: Payer: Medicaid Other | Admitting: Physician Assistant

## 2020-09-10 ENCOUNTER — Telehealth: Payer: Self-pay | Admitting: Nurse Practitioner

## 2020-09-10 NOTE — Telephone Encounter (Signed)
Called pt per 2/10 sch msg - no answer and unable to leave vmail. Send letter in the mail with appt date and time

## 2020-09-14 ENCOUNTER — Telehealth: Payer: Self-pay | Admitting: Nurse Practitioner

## 2020-09-14 NOTE — Telephone Encounter (Signed)
Called pt per 2/14 sch msg - unable to reach pt and unable to leave voicemail. Mailed letter with appt date and time

## 2020-09-21 ENCOUNTER — Telehealth: Payer: Self-pay | Admitting: Nurse Practitioner

## 2020-09-21 NOTE — Telephone Encounter (Signed)
Attempted to call patient, voicemail not available. Mailed appointment changes. Per provider template.

## 2020-09-30 ENCOUNTER — Inpatient Hospital Stay: Payer: MEDICAID | Attending: Nurse Practitioner

## 2020-10-07 ENCOUNTER — Telehealth: Payer: Self-pay | Admitting: Nurse Practitioner

## 2020-10-07 ENCOUNTER — Inpatient Hospital Stay: Payer: MEDICAID | Admitting: Nurse Practitioner

## 2020-10-07 ENCOUNTER — Other Ambulatory Visit: Payer: Medicaid Other

## 2020-10-07 NOTE — Telephone Encounter (Signed)
No show for today's virtual visit, and previous no shows 2/2 and 2/9 plus a lab visit on 3/3. I called her phone and spouse contact listed in Epic, no answer. I have asked scheduler to mail her a letter.   Cira Rue, NP

## 2020-12-08 ENCOUNTER — Other Ambulatory Visit: Payer: Self-pay

## 2021-07-06 ENCOUNTER — Emergency Department (HOSPITAL_COMMUNITY)
Admission: EM | Admit: 2021-07-06 | Discharge: 2021-07-07 | Disposition: A | Discharge status: Home / Self Care | Payer: Medicaid Other

## 2021-07-06 NOTE — ED Notes (Signed)
NA x 1

## 2021-07-26 ENCOUNTER — Other Ambulatory Visit: Payer: Self-pay | Admitting: Urology

## 2021-07-26 DIAGNOSIS — R310 Gross hematuria: Secondary | ICD-10-CM

## 2021-07-26 DIAGNOSIS — N1330 Unspecified hydronephrosis: Secondary | ICD-10-CM

## 2021-07-26 DIAGNOSIS — N135 Crossing vessel and stricture of ureter without hydronephrosis: Secondary | ICD-10-CM

## 2021-07-28 ENCOUNTER — Other Ambulatory Visit: Payer: Medicaid Other

## 2021-07-29 ENCOUNTER — Other Ambulatory Visit: Payer: Medicaid Other

## 2021-08-05 ENCOUNTER — Ambulatory Visit
Admission: RE | Admit: 2021-08-05 | Discharge: 2021-08-05 | Disposition: A | Discharge status: Home / Self Care | Payer: Medicaid Other | Source: Ambulatory Visit | Attending: Urology | Admitting: Urology

## 2021-08-05 DIAGNOSIS — R310 Gross hematuria: Secondary | ICD-10-CM

## 2021-08-05 DIAGNOSIS — N135 Crossing vessel and stricture of ureter without hydronephrosis: Secondary | ICD-10-CM

## 2021-08-05 DIAGNOSIS — N1330 Unspecified hydronephrosis: Secondary | ICD-10-CM

## 2021-08-10 ENCOUNTER — Other Ambulatory Visit: Payer: Self-pay | Admitting: Urology

## 2021-08-10 ENCOUNTER — Telehealth: Payer: Self-pay | Admitting: Oncology

## 2021-08-10 NOTE — Telephone Encounter (Signed)
Called [facility] and introduced myself at the [employer].  Scheduled her for a new patient appointment with Dr. Alvy Bimler on 08/12/21 at 3:30 with arrival to the cancer center at 3:00.  She verbalized understanding and agreement of the appointment.  Advised her I will call her on Thursday to remind her of the appointment.

## 2021-08-11 ENCOUNTER — Encounter (HOSPITAL_BASED_OUTPATIENT_CLINIC_OR_DEPARTMENT_OTHER): Payer: Self-pay | Admitting: Urology

## 2021-08-12 ENCOUNTER — Inpatient Hospital Stay: Payer: Self-pay | Attending: Hematology and Oncology | Admitting: Hematology and Oncology

## 2021-08-12 ENCOUNTER — Other Ambulatory Visit: Payer: Self-pay

## 2021-08-12 ENCOUNTER — Encounter: Payer: Self-pay | Admitting: Hematology and Oncology

## 2021-08-12 DIAGNOSIS — Z79899 Other long term (current) drug therapy: Secondary | ICD-10-CM | POA: Insufficient documentation

## 2021-08-12 DIAGNOSIS — Z818 Family history of other mental and behavioral disorders: Secondary | ICD-10-CM | POA: Insufficient documentation

## 2021-08-12 DIAGNOSIS — N1330 Unspecified hydronephrosis: Secondary | ICD-10-CM | POA: Insufficient documentation

## 2021-08-12 DIAGNOSIS — N179 Acute kidney failure, unspecified: Secondary | ICD-10-CM | POA: Insufficient documentation

## 2021-08-12 DIAGNOSIS — Z9049 Acquired absence of other specified parts of digestive tract: Secondary | ICD-10-CM | POA: Insufficient documentation

## 2021-08-12 DIAGNOSIS — Z881 Allergy status to other antibiotic agents status: Secondary | ICD-10-CM | POA: Insufficient documentation

## 2021-08-12 DIAGNOSIS — F17210 Nicotine dependence, cigarettes, uncomplicated: Secondary | ICD-10-CM | POA: Insufficient documentation

## 2021-08-12 DIAGNOSIS — C539 Malignant neoplasm of cervix uteri, unspecified: Secondary | ICD-10-CM | POA: Insufficient documentation

## 2021-08-12 DIAGNOSIS — Z9221 Personal history of antineoplastic chemotherapy: Secondary | ICD-10-CM | POA: Insufficient documentation

## 2021-08-12 DIAGNOSIS — I700 Atherosclerosis of aorta: Secondary | ICD-10-CM | POA: Insufficient documentation

## 2021-08-12 DIAGNOSIS — Z923 Personal history of irradiation: Secondary | ICD-10-CM | POA: Insufficient documentation

## 2021-08-12 DIAGNOSIS — R634 Abnormal weight loss: Secondary | ICD-10-CM | POA: Insufficient documentation

## 2021-08-12 DIAGNOSIS — D649 Anemia, unspecified: Secondary | ICD-10-CM | POA: Insufficient documentation

## 2021-08-12 NOTE — Progress Notes (Signed)
[facility] progress notes  Patient Care Team: Pcp, No as PCP - General  CHIEF COMPLAINTS/PURPOSE OF VISIT:  History of cervical cancer, abnormal imaging study, for further evaluation  HISTORY OF PRESENTING ILLNESS:  Marie Brewer 41 y.o. female was transferred to my care due to history of cervical cancer and abnormal imaging study  She was seen by Dr. Burr Medico a year ago in the hospital when she was admitted and was evaluated for anemia There was this discussion about possible bone marrow biopsy for evaluation; she did not show up because she was fearful for bone marrow biopsy At that time, she was found to have hydronephrosis and renal failure After discharge from the hospital, she was not able to see nephrologist or urologist due to lack of insurance  She had past history of stage IIb cervical cancer, treated at [facility] in 2011 She was seen by GYN surgeon in 2014 and then she was lost to follow-up due to lack of insurance Most recently, she was seen by Dr. Gilford Rile again and was recommended CT imaging which showed abnormal lymphadenopathy She is being referred here for further evaluation  She is very weak She does not feel well with poor appetite and has lost weight Unfortunately, she continues to smoke She is here with her friend, [name] Even though she is married, her husband does not take good care of her She has 2 children but she is not in good relationship with them  I reviewed the patient's records extensive and collaborated the history with the patient. Summary of her history is as follows: Oncology History  Cervical ca ([place])  09/27/2009 Initial Diagnosis   Cervical ca, IIB SCCA diagnosed in [facility]    - 01/27/2010 Radiation Therapy   Compelted chemorads and brachy   11/18/2010 Surgery   EUA  and biopsies negative   08/05/2021 Imaging   1. Bilateral inguinal lymphadenopathy. Associated multiple prominent retroperitoneal lymph nodes with an enlarged  left periaortic lymph node measuring up to 1.4 cm. Markedly limited evaluation on this noncontrast study. Findings concerning for a patient with history of malignancy. 2. Bilateral ureteral stents in grossly appropriate position with associated right moderate and left severe hydronephrosis. 3. Stool throughout the colon. 4.  Aortic Atherosclerosis (ICD10-I70.0).     08/12/2021 Cancer Staging   Staging form: Cervix Uteri, AJCC Version 9 - Clinical stage from 08/12/2021: Stage IVB (rcT3b, cN2a, cM1) - Signed by Heath Lark, MD on 08/12/2021 Stage prefix: Recurrence      MEDICAL HISTORY:  Past Medical History:  Diagnosis Date   Bilateral ureteral obstruction    History of cancer chemotherapy    completed 06/ 2011 for cervical cancer   History of cervical cancer 08/2009   followed by [facility] cancer center;  dx SCC Stage IIB,  completed chemoradiation 06/ 2011   History of radiation therapy    completed 06/ 2011 ffor cervical cancer   Migraine headache    Polysubstance abuse ([place]) 05/05/2020   heroin    SURGICAL HISTORY: Past Surgical History:  Procedure Laterality Date   CYSTOSCOPY W/ URETERAL STENT PLACEMENT Bilateral 08/15/2020   Procedure: CYSTOSCOPY WITH RETROGRADE PYELOGRAM/URETERAL STENT PLACEMENT;  Surgeon: Ceasar Mons, MD;  Location: [place];  Service: Urology;  Laterality: Bilateral;   INCISION AND DRAINAGE ABSCESS  04/09/2003   '@[facility]'$  ;  RIGHT SUBMANDIBULAR ABSCESS (LARGE) AND EXTRACTION MULTIPLE TEETH   IRRIGATION AND DEBRIDEMENT ABSCESS Bilateral 05/05/2020   Procedure: left forearm incision and drainage and aspiration of  the right wrist joint;  Surgeon: Cindra Presume, MD;  Location: [facility];  Service: Plastics;  Laterality: Bilateral;    SOCIAL HISTORY: Social History   Socioeconomic History   Marital status: Divorced    Spouse name: Not on file   Number of children: Not on file   Years of education: Not on file   Highest education level: Not on file   Occupational History   Not on file  Tobacco Use   Smoking status: Every Day    Packs/day: 0.25    Types: Cigarettes   Smokeless tobacco: Never  Vaping Use   Vaping Use: Never used  Substance and Sexual Activity   Alcohol use: No   Drug use: Not Currently    Types: Heroin    Comment: Stopped 2 months ago   Sexual activity: Not on file  Other Topics Concern   Not on file  Social History Narrative   Not on file   Social Determinants of Health   Financial Resource Strain: Not on file  Food Insecurity: Not on file  Transportation Needs: Not on file  Physical Activity: Not on file  Stress: Not on file  Social Connections: Not on file  Intimate Partner Violence: Not on file    FAMILY HISTORY: Family History  Problem Relation Age of Onset   Autism spectrum disorder Other     ALLERGIES:  is allergic to imitrex [sumatriptan base] and cephalexin.  MEDICATIONS:  Current Outpatient Medications  Medication Sig Dispense Refill   acetaminophen (TYLENOL) 325 MG tablet Take 2 tablets (650 mg total) by mouth every 6 (six) hours as needed for mild pain, fever or headache.     folic acid (FOLVITE) 1 MG tablet Take 1 tablet (1 mg total) by mouth daily. 30 tablet 3   omeprazole (PRILOSEC) 40 MG capsule Take 1 capsule (40 mg total) by mouth daily. 60 capsule 1   promethazine (PHENERGAN) 6.25 MG/5ML syrup Take 5 mLs (6.25 mg total) by mouth every 8 (eight) hours as needed for up to 10 days for nausea or vomiting. 120 mL 0   No current facility-administered medications for this visit.    REVIEW OF SYSTEMS:   Constitutional: Denies fevers, chills or abnormal night sweats Eyes: Denies blurriness of vision, double vision or watery eyes Ears, nose, mouth, throat, and face: Denies mucositis or sore throat Respiratory: Denies cough, dyspnea or wheezes Cardiovascular: Denies palpitation, chest discomfort or lower extremity swelling Gastrointestinal:  Denies nausea, heartburn or change in  bowel habits Skin: Denies abnormal skin rashes Lymphatics: Denies new lymphadenopathy or easy bruising Neurological:Denies numbness, tingling or new weaknesses Behavioral/Psych: Mood is stable, no new changes  All other systems were reviewed with the patient and are negative.  PHYSICAL EXAMINATION: ECOG PERFORMANCE STATUS: 2 - Symptomatic, <50% confined to bed  Vitals:   08/12/21 1559  BP: (!) 124/58  Pulse: (!) 121  Resp: 18  Temp: 98.1 F (36.7 C)  SpO2: 100%   Filed Weights   08/12/21 1559  Weight: 178 lb 6.4 oz (80.9 kg)    GENERAL:alert, no distress and comfortable.  She is very weak, pale, examined on the wheelchair SKIN: skin color, texture, turgor are normal, no rashes or significant lesions EYES: normal, conjunctiva are pink and non-injected, sclera clear OROPHARYNX:no exudate, normal lips, buccal mucosa, and tongue  NECK: supple, thyroid normal size, non-tender, without nodularity LYMPH:  no palpable lymphadenopathy in the cervical, axillary or inguinal LUNGS: clear to auscultation and percussion with normal breathing effort  HEART: regular rate & rhythm and no murmurs without lower extremity edema ABDOMEN:abdomen soft, non-tender and normal bowel sounds Musculoskeletal:no cyanosis of digits and no clubbing  PSYCH: alert & oriented x 3 with fluent speech NEURO: no focal motor/sensory deficits  LABORATORY DATA:  I have reviewed the data as listed Lab Results  Component Value Date   WBC 13.0 (H) 08/22/2020   HGB 9.0 (L) 08/22/2020   HCT 27.7 (L) 08/22/2020   MCV 93.9 08/22/2020   PLT 158 08/22/2020   Recent Labs    08/14/20 0318 08/14/20 1432 08/15/20 0300 08/15/20 1241 08/16/20 0328 08/17/20 0856 08/20/20 0409 08/21/20 0351 08/22/20 0304  NA 131*  --  132*   < > 132*   < > 135 134* 135  K 3.1*  --  3.0*   < > 2.9*   < > 3.3* 3.8 5.0  CL 87*  --  92*   < > 97*   < > 105 104 108  CO2 24  --  22  --  19*   < > 20* 19* 11*  GLUCOSE 102*  --  96   < >  173*   < > 87 96 77  BUN 114*  --  106*   < > 97*   < > 60* 51* 50*  CREATININE 10.73*   < > 10.02*   < > 8.64*   < > 5.55* 4.89* 4.56*  CALCIUM 7.3*  --  7.1*  --  8.2*   < > 8.1* 8.2* 8.2*  GFRNONAA 4*   < > 5*  --  5*   < > 9* 11* 12*  PROT 8.8*  --  7.5  --   --   --   --   --   --   ALBUMIN 2.6*  --  2.3*  --  2.2*  --   --   --   --   AST 11*  --  9*  --   --   --   --   --   --   ALT 9  --  8  --   --   --   --   --   --   ALKPHOS 111  --  91  --   --   --   --   --   --   BILITOT 0.8  --  0.6  --   --   --   --   --   --    < > = values in this interval not displayed.    RADIOGRAPHIC STUDIES: I have personally reviewed the radiological images as listed and agreed with the findings in the report. CT ABDOMEN PELVIS WO CONTRAST  Result Date: 08/05/2021 CLINICAL DATA:  Hematuria x 2 weeks Hx of ureteral stents Gb Hx of cervical ca, chemo xrt EXAM: CT ABDOMEN AND PELVIS WITHOUT CONTRAST TECHNIQUE: Multidetector CT imaging of the abdomen and pelvis was performed following the standard protocol without IV contrast. COMPARISON:  None. FINDINGS: Lower chest: No acute abnormality. Cardiac changes suggestive of anemia. Hepatobiliary: No focal liver abnormality. Status post cholecystectomy. No biliary dilatation. Pancreas: No focal lesion. Normal pancreatic contour. No surrounding inflammatory changes. No main pancreatic ductal dilatation. Spleen: Normal in size without focal abnormality. Adrenals/Urinary Tract: No adrenal nodule bilaterally. Bilateral ureteral stents in grossly appropriate position with associated right moderate and left severe hydronephrosis. No nephrolithiasis bilaterally. No definite contour-deforming renal mass. No ureterolithiasis or hydroureter. The urinary bladder is  unremarkable. Stomach/Bowel: Stomach is within normal limits. No evidence of bowel wall thickening or dilatation. Stool throughout the colon. Appendix appears normal. Vascular/Lymphatic: No abdominal aorta or iliac  aneurysm. Mild atherosclerotic plaque. No abdominal or pelvic lymphadenopathy. Bilateral inguinal lymphadenopathy measuring up to 2.5 cm on the left and 2.2 cm on the right. Multiple prominent retroperitoneal lymph nodes as well as the left periaortic 1.4 cm enlarged lymph node. Reproductive: Uterus and bilateral adnexa are unremarkable. Other: No intraperitoneal free fluid. No intraperitoneal free gas. No organized fluid collection. Musculoskeletal: No abdominal wall hernia or abnormality. Mild subcutaneus soft tissue edema of the hips. No suspicious lytic or blastic osseous lesions. No acute displaced fracture. IMPRESSION: 1. Bilateral inguinal lymphadenopathy. Associated multiple prominent retroperitoneal lymph nodes with an enlarged left periaortic lymph node measuring up to 1.4 cm. Markedly limited evaluation on this noncontrast study. Findings concerning for a patient with history of malignancy. 2. Bilateral ureteral stents in grossly appropriate position with associated right moderate and left severe hydronephrosis. 3. Stool throughout the colon. 4.  Aortic Atherosclerosis (ICD10-I70.0). These results will be called to the ordering clinician or representative by the Radiologist Assistant, and communication documented in the PACS or [employer]. Electronically Signed   By: Iven Finn M.D.   On: 08/05/2021 16:43    ASSESSMENT & PLAN:  Cervical ca [facility]) The patient did not remember much details from her prior treatment According to documentation from GYN oncologist, Dr. Alycia Rossetti, who saw her in 2014, she had stage IIb cervical cancer status post chemoradiation therapy Her treatment was completed in 2011 She was lost to follow-up since 2014 due to lack of insurance She denies vaginal bleeding, discharge or pelvic pain She was found to have chronic kidney disease stage V last year and was admitted to the hospital with hydronephrosis She had stent placement and then again lost to follow-up due to  lack of insurance and inability to pay for appointments to see nephrologist and urologist She was seen back again by urologist recently who ordered CT imaging and was found to have lymphadenopathy, worrisome for cancer recurrence In the meantime, the patient is sick, weak, with progressive weight loss and lethargy She was supposed to follow-up with hematologist, Dr. Burr Medico last year for work-up for severe anemia but she was so concerned about possibility of doing bone marrow biopsy that she declined coming back for return appointment despite multiple phone calls I have reviewed imaging study with the patient The lymphadenopathy is nonspecific, infection cannot be excluded She will need further work-up including PET CT imaging or MRI and biopsy I am most concerned about her renal failure and severe anemia that require urgent attention now as she is not symptomatic from the lymphadenopathy I recommend the patient to present to the emergency room to be admitted for expedited work-up  Acute renal failure (ARF) [facility]) The patient has severe chronic kidney disease stage V last year I am surprised to see she is still alive even in the absence of hemodialysis She needs urgent evaluation and stent exchange She was not able to see nephrologist due to lack of insurance Recommend urgent evaluation in the emergency room and she would likely need hemodialysis soon Her stent needs to be exchanged and she has appointment scheduled for January 18  Anemia, unspecified She is extremely pale and tachycardic I suspect she has severe anemia secondary to chronic renal failure and will need urgent blood transfusion Unfortunately, the lab is already closed at the cancer center Due to risk  of ischemia and kidney failure, I recommend urgent evaluation in the emergency department  No orders of the defined types were placed in this encounter.   All questions were answered. The patient knows to call the clinic with any  problems, questions or concerns. The total time spent in the appointment was 80 minutes encounter with patients including review of chart and various tests results, discussions about plan of care and coordination of care plan   Heath Lark, MD 08/12/2021 4:30 PM

## 2021-08-12 NOTE — Assessment & Plan Note (Addendum)
The patient did not remember much details from her prior treatment According to documentation from GYN oncologist, Dr. Alycia Rossetti, who saw her in 2014, she had stage IIb cervical cancer status post chemoradiation therapy Her treatment was completed in 2011 She was lost to follow-up since 2014 due to lack of insurance She denies vaginal bleeding, discharge or pelvic pain She was found to have chronic kidney disease stage V last year and was admitted to the hospital with hydronephrosis She had stent placement and then again lost to follow-up due to lack of insurance and inability to pay for appointments to see nephrologist and urologist She was seen back again by urologist recently who ordered CT imaging and was found to have lymphadenopathy, worrisome for cancer recurrence In the meantime, the patient is sick, weak, with progressive weight loss and lethargy She was supposed to follow-up with hematologist, Dr. Burr Medico last year for work-up for severe anemia but she was so concerned about possibility of doing bone marrow biopsy that she declined coming back for return appointment despite multiple phone calls I have reviewed imaging study with the patient The lymphadenopathy is nonspecific, infection cannot be excluded She will need further work-up including PET CT imaging or MRI and biopsy I am most concerned about her renal failure and severe anemia that require urgent attention now as she is not symptomatic from the lymphadenopathy I recommend the patient to present to the emergency room to be admitted for expedited work-up

## 2021-08-12 NOTE — Assessment & Plan Note (Signed)
She is extremely pale and tachycardic I suspect she has severe anemia secondary to chronic renal failure and will need urgent blood transfusion Unfortunately, the lab is already closed at the cancer center Due to risk of ischemia and kidney failure, I recommend urgent evaluation in the emergency department

## 2021-08-12 NOTE — Assessment & Plan Note (Signed)
The patient has severe chronic kidney disease stage V last year I am surprised to see she is still alive even in the absence of hemodialysis She needs urgent evaluation and stent exchange She was not able to see nephrologist due to lack of insurance Recommend urgent evaluation in the emergency room and she would likely need hemodialysis soon Her stent needs to be exchanged and she has appointment scheduled for January 18

## 2021-08-16 NOTE — Progress Notes (Signed)
Reviewing pt chart for pre-op interview for surgery scheduled for tomorrow 08-17-2021 by Dr Lovena Neighbours for bilateral ureteral stent exchange. Noted pt had seen Dr Alvy Bimler (cancer center) office note in epic from 08-12-2021 , pt was told recommendation to go to ED.  Noted pt went to ED at [facility] on 08-14-2021 found to have Hg 3.3 pt was admitted to hospital as inpatient and is still there. Called and spoke w [place], [place] scheduler for Dr Lovena Neighbours, informed her about pt and let Dr Lovena Neighbours know that ED note it looks like they plan to have ureteral stents exchanged at [facility]. Coni stated will let Dr Lovena Neighbours know and cancel pt.

## 2021-08-17 ENCOUNTER — Ambulatory Visit (HOSPITAL_BASED_OUTPATIENT_CLINIC_OR_DEPARTMENT_OTHER): Admission: RE | Admit: 2021-08-17 | Payer: Medicaid Other | Source: Home / Self Care | Admitting: Urology

## 2021-08-17 HISTORY — DX: Personal history of irradiation: Z92.3

## 2021-08-17 HISTORY — DX: Personal history of antineoplastic chemotherapy: Z92.21

## 2021-08-17 HISTORY — DX: Crossing vessel and stricture of ureter without hydronephrosis: N13.5

## 2021-08-17 SURGERY — CYSTOSCOPY, FLEXIBLE, WITH STENT REPLACEMENT
Anesthesia: General | Laterality: Bilateral

## 2021-08-18 ENCOUNTER — Other Ambulatory Visit: Payer: Self-pay | Admitting: Urology

## 2021-08-25 NOTE — Patient Instructions (Signed)
DUE TO COVID-19 ONLY ONE VISITOR IS ALLOWED TO COME WITH YOU AND STAY IN THE WAITING ROOM ONLY DURING PRE OP AND PROCEDURE.   **NO VISITORS ARE ALLOWED IN THE SHORT STAY AREA OR RECOVERY ROOM!!**  IF YOU WILL BE ADMITTED INTO THE HOSPITAL YOU ARE ALLOWED ONLY TWO SUPPORT PEOPLE DURING VISITATION HOURS ONLY (7 AM -8PM)   The support person(s) must pass our screening, gel in and out, and wear a mask at all times, including in the patients room. Patients must also wear a mask when staff or their support person are in the room. Visitors GUEST BADGE MUST BE WORN VISIBLY  One adult visitor may remain with you overnight and MUST be in the room by 8 P.M.  No visitors under the age of 31. Any visitor under the age of 4 must be accompanied by an adult.        Your procedure is scheduled on: 08/31/21   Report to [facility] Main Entrance    Report to admitting at : 6:15 AM   Call this number if you have problems the morning of surgery [phone]   Do not eat food :After Midnight.   May have liquids until : 5:30 AM   day of surgery  CLEAR LIQUID DIET  Foods Allowed                                                                     Foods Excluded  Water, Black Coffee and tea, regular and decaf                             liquids that you cannot  Plain Jell-O in any flavor  (No red)                                           see through such as: Fruit ices (not with fruit pulp)                                     milk, soups, orange juice              Iced Popsicles (No red)                                    All solid food                                   Apple juices Sports drinks like Gatorade (No red) Lightly seasoned clear broth or consume(fat free) Sugar  Sample Menu Breakfast                                Lunch  Supper Cranberry juice                    Beef broth                            Chicken broth Jell-O                                      Grape juice                           Apple juice Coffee or tea                        Jell-O                                      Popsicle                                                Coffee or tea                        Coffee or tea    Oral Hygiene is also important to reduce your risk of infection.                                    Remember - BRUSH YOUR TEETH THE MORNING OF SURGERY WITH YOUR REGULAR TOOTHPASTE   Do NOT smoke after Midnight   Take these medicines the morning of surgery with A SIP OF WATER: N/A  DO NOT TAKE ANY ORAL DIABETIC MEDICATIONS DAY OF YOUR SURGERY                              You may not have any metal on your body including hair pins, jewelry, and body piercing             Do not wear make-up, lotions, powders, perfumes/cologne, or deodorant  Do not wear nail polish including gel and S&S, artificial/acrylic nails, or any other type of covering on natural nails including finger and toenails. If you have artificial nails, gel coating, etc. that needs to be removed by a nail salon please have this removed prior to surgery or surgery may need to be canceled/ delayed if the surgeon/ anesthesia feels like they are unable to be safely monitored.   Do not shave  48 hours prior to surgery.    Do not bring valuables to the hospital. [place].   Contacts, dentures or bridgework may not be worn into surgery.   Bring small overnight bag day of surgery.    Patients discharged on the day of surgery will not be allowed to drive home.  Someone needs to stay with you for the first 24 hours after anesthesia.   Special Instructions: Bring a copy of your healthcare power of  attorney and living will documents         the day of surgery if you haven't scanned them before.              Please read over the following fact sheets you were given: IF YOU HAVE QUESTIONS ABOUT YOUR PRE-OP INSTRUCTIONS PLEASE CALL  [phone]     [facility] - Preparing for Surgery Before surgery, you can play an important role.  Because skin is not sterile, your skin needs to be as free of germs as possible.  You can reduce the number of germs on your skin by washing with CHG (chlorahexidine gluconate) soap before surgery.  CHG is an antiseptic cleaner which kills germs and bonds with the skin to continue killing germs even after washing. Please DO NOT use if you have an allergy to CHG or antibacterial soaps.  If your skin becomes reddened/irritated stop using the CHG and inform your nurse when you arrive at Short Stay. Do not shave (including legs and underarms) for at least 48 hours prior to the first CHG shower.  You may shave your face/neck. Please follow these instructions carefully:  1.  Shower with CHG Soap the night before surgery and the  morning of Surgery.  2.  If you choose to wash your hair, wash your hair first as usual with your  normal  shampoo.  3.  After you shampoo, rinse your hair and body thoroughly to remove the  shampoo.                           4.  Use CHG as you would any other liquid soap.  You can apply chg directly  to the skin and wash                       Gently with a scrungie or clean washcloth.  5.  Apply the CHG Soap to your body ONLY FROM THE NECK DOWN.   Do not use on face/ open                           Wound or open sores. Avoid contact with eyes, ears mouth and genitals (private parts).                       Wash face,  Genitals (private parts) with your normal soap.             6.  Wash thoroughly, paying special attention to the area where your surgery  will be performed.  7.  Thoroughly rinse your body with warm water from the neck down.  8.  DO NOT shower/wash with your normal soap after using and rinsing off  the CHG Soap.                9.  Pat yourself dry with a clean towel.            10.  Wear clean pajamas.            11.  Place clean sheets on your bed the night of your  first shower and do not  sleep with pets. Day of Surgery : Do not apply any lotions/deodorants the morning of surgery.  Please wear clean clothes to the hospital/surgery center.  FAILURE TO FOLLOW THESE INSTRUCTIONS MAY RESULT IN THE CANCELLATION OF YOUR SURGERY PATIENT SIGNATURE_________________________________  NURSE SIGNATURE__________________________________  ________________________________________________________________________

## 2021-08-26 ENCOUNTER — Other Ambulatory Visit: Payer: Self-pay

## 2021-08-26 ENCOUNTER — Encounter (HOSPITAL_COMMUNITY)
Admission: RE | Admit: 2021-08-26 | Discharge: 2021-08-26 | Disposition: A | Discharge status: Home / Self Care | Payer: Medicaid Other | Source: Ambulatory Visit | Attending: Urology | Admitting: Urology

## 2021-08-26 ENCOUNTER — Encounter (HOSPITAL_COMMUNITY): Payer: Self-pay

## 2021-08-26 VITALS — BP 145/79 | HR 89 | Temp 98.1°F | Ht 65.0 in | Wt 191.0 lb

## 2021-08-26 DIAGNOSIS — Z01818 Encounter for other preprocedural examination: Secondary | ICD-10-CM

## 2021-08-26 DIAGNOSIS — Z01812 Encounter for preprocedural laboratory examination: Secondary | ICD-10-CM | POA: Insufficient documentation

## 2021-08-26 HISTORY — DX: Chronic kidney disease, unspecified: N18.9

## 2021-08-26 HISTORY — DX: Anemia, unspecified: D64.9

## 2021-08-26 HISTORY — DX: Malignant (primary) neoplasm, unspecified: C80.1

## 2021-08-26 LAB — CBC
HCT: 22.8 % — ABNORMAL LOW (ref 36.0–46.0)
Hemoglobin: 6.8 g/dL — CL (ref 12.0–15.0)
MCH: 29.7 pg (ref 26.0–34.0)
MCHC: 29.8 g/dL — ABNORMAL LOW (ref 30.0–36.0)
MCV: 99.6 fL (ref 80.0–100.0)
Platelets: 291 10*3/uL (ref 150–400)
RBC: 2.29 MIL/uL — ABNORMAL LOW (ref 3.87–5.11)
RDW: 15.6 % — ABNORMAL HIGH (ref 11.5–15.5)
WBC: 7.8 10*3/uL (ref 4.0–10.5)
nRBC: 0 % (ref 0.0–0.2)

## 2021-08-26 NOTE — Progress Notes (Signed)
CRITICAL RESULT PROVIDER NOTIFICATION  Test performed and critical result:  HGB: 6.8  Date and time result received: 08/26/21 @ 3:15 PM  Provider name/title: Dr. Ellison Hughs  Date and time provider notified: 08/26/21 @ 3:20 PM  Date and time provider responded:   Provider response:En route

## 2021-08-26 NOTE — Progress Notes (Signed)
COVID Vaccine Completed: NO Date COVID Vaccine completed: COVID vaccine manufacturer: [place] Test: N/A PCP - NO PCP Cardiologist -   Chest x-ray -  EKG -  Stress Test -  ECHO -  Cardiac Cath -  Pacemaker/ICD device last checked:  Sleep Study -  CPAP -   Fasting Blood Sugar -  Checks Blood Sugar _____ times a day  Blood Thinner Instructions: Aspirin Instructions: Last Dose:  Anesthesia review: Hx: smoker,Stage V kidney failure.Pt. said she has cellulitis on both legs,she had them wrap with bandages.As per her there are some open spot without drainage.  Patient denies shortness of breath, fever, cough and chest pain at PAT appointment   Patient verbalized understanding of instructions that were given to them at the PAT appointment. Patient was also instructed that they will need to review over the PAT instructions again at home before surgery.

## 2021-08-29 ENCOUNTER — Telehealth: Payer: Self-pay

## 2021-08-29 ENCOUNTER — Telehealth: Payer: Self-pay | Admitting: Oncology

## 2021-08-29 ENCOUNTER — Other Ambulatory Visit: Payer: Self-pay | Admitting: Hematology and Oncology

## 2021-08-29 DIAGNOSIS — C539 Malignant neoplasm of cervix uteri, unspecified: Secondary | ICD-10-CM

## 2021-08-29 DIAGNOSIS — D539 Nutritional anemia, unspecified: Secondary | ICD-10-CM

## 2021-08-29 NOTE — Telephone Encounter (Signed)
Called and left a message asking her to call the office back. Offering appt at 1130 today for lab, see Dr. Alvy Bimler and blood transfusion.

## 2021-08-29 NOTE — Telephone Encounter (Signed)
Left another message for Marie Brewer and also left a message for her friend [name] requesting a return call.

## 2021-08-29 NOTE — Telephone Encounter (Signed)
She called back. She cannot come today and ask for appts tomorrow.  Told her that I will call her back later today and will try to arrange appts.

## 2021-08-29 NOTE — Telephone Encounter (Signed)
Called back and given appt for 1200 1/31 for lab, then Dr. Alvy Bimler and the blood transfusion at 1:30 pm. Instructed to arrive 20 mins early. She verbalized understanding.

## 2021-08-29 NOTE — Telephone Encounter (Signed)
Left message regarding appointment to see Dr. Alvy Bimler and to receive a unit of blood at the cancer center today.  Requested a return call.

## 2021-08-30 ENCOUNTER — Other Ambulatory Visit: Payer: Self-pay | Admitting: Hematology and Oncology

## 2021-08-30 ENCOUNTER — Telehealth: Payer: Self-pay

## 2021-08-30 ENCOUNTER — Inpatient Hospital Stay: Payer: Self-pay

## 2021-08-30 ENCOUNTER — Encounter: Payer: Self-pay | Admitting: Hematology and Oncology

## 2021-08-30 ENCOUNTER — Inpatient Hospital Stay: Payer: Medicaid Other | Admitting: Hematology and Oncology

## 2021-08-30 ENCOUNTER — Other Ambulatory Visit: Payer: Self-pay

## 2021-08-30 ENCOUNTER — Inpatient Hospital Stay (HOSPITAL_BASED_OUTPATIENT_CLINIC_OR_DEPARTMENT_OTHER): Payer: Self-pay | Admitting: Hematology and Oncology

## 2021-08-30 VITALS — BP 134/69 | HR 96 | Temp 97.9°F | Resp 18 | Ht 65.0 in | Wt 192.2 lb

## 2021-08-30 DIAGNOSIS — D649 Anemia, unspecified: Secondary | ICD-10-CM

## 2021-08-30 DIAGNOSIS — Z9289 Personal history of other medical treatment: Secondary | ICD-10-CM

## 2021-08-30 DIAGNOSIS — C539 Malignant neoplasm of cervix uteri, unspecified: Secondary | ICD-10-CM

## 2021-08-30 DIAGNOSIS — N179 Acute kidney failure, unspecified: Secondary | ICD-10-CM

## 2021-08-30 DIAGNOSIS — D539 Nutritional anemia, unspecified: Secondary | ICD-10-CM

## 2021-08-30 DIAGNOSIS — N3090 Cystitis, unspecified without hematuria: Secondary | ICD-10-CM

## 2021-08-30 DIAGNOSIS — N390 Urinary tract infection, site not specified: Secondary | ICD-10-CM | POA: Insufficient documentation

## 2021-08-30 HISTORY — DX: Personal history of other medical treatment: Z92.89

## 2021-08-30 LAB — RETICULOCYTES
Immature Retic Fract: 33.5 % — ABNORMAL HIGH (ref 2.3–15.9)
RBC.: 1.9 MIL/uL — ABNORMAL LOW (ref 3.87–5.11)
Retic Count, Absolute: 33.3 10*3/uL (ref 19.0–186.0)
Retic Ct Pct: 1.8 % (ref 0.4–3.1)

## 2021-08-30 LAB — PREPARE RBC (CROSSMATCH)

## 2021-08-30 LAB — SAMPLE TO BLOOD BANK

## 2021-08-30 LAB — CBC WITH DIFFERENTIAL (CANCER CENTER ONLY)
Abs Immature Granulocytes: 0.14 10*3/uL — ABNORMAL HIGH (ref 0.00–0.07)
Basophils Absolute: 0 10*3/uL (ref 0.0–0.1)
Basophils Relative: 0 %
Eosinophils Absolute: 0.1 10*3/uL (ref 0.0–0.5)
Eosinophils Relative: 2 %
HCT: 18.4 % — ABNORMAL LOW (ref 36.0–46.0)
Hemoglobin: 5.5 g/dL — CL (ref 12.0–15.0)
Immature Granulocytes: 2 %
Lymphocytes Relative: 19 %
Lymphs Abs: 1.3 10*3/uL (ref 0.7–4.0)
MCH: 28.6 pg (ref 26.0–34.0)
MCHC: 29.9 g/dL — ABNORMAL LOW (ref 30.0–36.0)
MCV: 95.8 fL (ref 80.0–100.0)
Monocytes Absolute: 0.5 10*3/uL (ref 0.1–1.0)
Monocytes Relative: 7 %
Neutro Abs: 4.9 10*3/uL (ref 1.7–7.7)
Neutrophils Relative %: 70 %
Platelet Count: 264 10*3/uL (ref 150–400)
RBC: 1.92 MIL/uL — ABNORMAL LOW (ref 3.87–5.11)
RDW: 15.3 % (ref 11.5–15.5)
WBC Count: 7 10*3/uL (ref 4.0–10.5)
nRBC: 0 % (ref 0.0–0.2)

## 2021-08-30 LAB — IRON AND IRON BINDING CAPACITY (CC-WL,HP ONLY)
Iron: 48 ug/dL (ref 28–170)
Saturation Ratios: 18 % (ref 10.4–31.8)
TIBC: 266 ug/dL (ref 250–450)
UIBC: 218 ug/dL (ref 148–442)

## 2021-08-30 LAB — ABO/RH: ABO/RH(D): O NEG

## 2021-08-30 LAB — FERRITIN: Ferritin: 293 ng/mL (ref 11–307)

## 2021-08-30 LAB — VITAMIN B12: Vitamin B-12: 348 pg/mL (ref 180–914)

## 2021-08-30 MED ORDER — ACETAMINOPHEN 325 MG PO TABS
650.0000 mg | ORAL_TABLET | Freq: Once | ORAL | Status: AC
  Administered 2021-08-30: 650 mg via ORAL
  Filled 2021-08-30: qty 2

## 2021-08-30 MED ORDER — SODIUM CHLORIDE 0.9% IV SOLUTION
250.0000 mL | Freq: Once | INTRAVENOUS | Status: AC
  Administered 2021-08-30: 250 mL via INTRAVENOUS

## 2021-08-30 MED ORDER — DIPHENHYDRAMINE HCL 25 MG PO CAPS
25.0000 mg | ORAL_CAPSULE | Freq: Once | ORAL | Status: AC
  Administered 2021-08-30: 25 mg via ORAL
  Filled 2021-08-30: qty 1

## 2021-08-30 NOTE — Assessment & Plan Note (Signed)
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

## 2021-08-30 NOTE — Assessment & Plan Note (Signed)
I have reviewed documentation from [facility], [place] Apparently, the patient had declined imaging study to follow-up on abnormal lymphadenopathy It is certainly possible that infection can cause reactive lymphadenopathy I will address that after her stent has been removed Clinically, she is not symptomatic She will need GYN follow-up at some point

## 2021-08-30 NOTE — Anesthesia Preprocedure Evaluation (Addendum)
Anesthesia Evaluation  Patient identified by MRN, date of birth, ID band Patient awake    Reviewed: Allergy & Precautions, H&P , NPO status , Patient's Chart, lab work & pertinent test results  Airway Mallampati: II   Neck ROM: full    Dental   Pulmonary Current Smoker and Patient abstained from smoking.,    breath sounds clear to auscultation       Cardiovascular negative cardio ROS   Rhythm:regular Rate:Normal     Neuro/Psych  Headaches,    GI/Hepatic negative GI ROS, (+)     substance abuse (h/o heroin use)  ,   Endo/Other    Renal/GU ARF and CRFRenal disease  negative genitourinary   Musculoskeletal negative musculoskeletal ROS (+)   Abdominal   Peds negative pediatric ROS (+)  Hematology  (+) Blood dyscrasia, anemia , Getting outpatient blood transfusions. Last was on 08/31/21 (received 1u PRBC), next one is scheduled for 09/12/21. Today's H/H is 6.7/22.    Anesthesia Other Findings   Reproductive/Obstetrics H/o cervical cancer                          Anesthesia Physical  Anesthesia Plan  ASA: 3  Anesthesia Plan: General   Post-op Pain Management:    Induction: Intravenous  PONV Risk Score and Plan: 2 and Ondansetron, Dexamethasone, Midazolam and Treatment may vary due to age or medical condition  Airway Management Planned: LMA  Additional Equipment:   Intra-op Plan:   Post-operative Plan: Extubation in OR  Informed Consent: I have reviewed the patients History and Physical, chart, labs and discussed the procedure including the risks, benefits and alternatives for the proposed anesthesia with the patient or authorized representative who has indicated his/her understanding and acceptance.     Dental advisory given  Plan Discussed with: CRNA and Anesthesiologist  Anesthesia Plan Comments: (Will discuss with [place] need for blood transfusion and if she can remain as an  ambulatory patient. Will order type and screen and plan to setup blood. Norton Blizzard, MD   Per Epic notes, patient was supposed to go to ED yesterday 09/06/21 for an additional 1uPRBC, but did not. Will plan to transfuse 1uPRBC today. Norton Blizzard, MD   )    Anesthesia Quick Evaluation

## 2021-08-30 NOTE — Telephone Encounter (Signed)
Called Marie Brewer at 12:10 to see if she is going to her appointments today.  She said she was not feeling well overnight and had a late start.  She is on her way and will be here in 10 minutes.

## 2021-08-30 NOTE — Progress Notes (Signed)
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 Apparently, the patient is scheduled to see nephrologist in 2 days in the outpatient clinic I will defer to them for management  Anemia, unspecified She has multifactorial anemia, likely anemia of severe chronic renal failure, and related to possible infection in her stent She also have history of intermittent bleeding with hematuria She is profoundly anemic today I tried my best to explain to the patient and her friend the pathophysiology of severe anemia related to renal failure I will try to transfuse 2 units of blood this week, 1 unit today and 1 unit tomorrow if possible I have ordered additional work-up to rule out nutritional deficiency If nutrition of deficiency is not present, I will try to get her started on erythropoietin stimulating agent I will see her in 2 weeks for further follow-up We discussed some of the risks, benefits, and alternatives of blood transfusions. The patient is symptomatic from anemia and the hemoglobin level is critically low.  Some of the side-effects to be expected including risks of transfusion reactions, chills, infection, syndrome of volume overload and risk of hospitalization from various reasons and the patient is willing to proceed and went ahead to sign consent today.   Cystitis She was prescribed antibiotics and has symptoms of cystitis The stent needs to come  out She is scheduled for stent removal tomorrow  Orders Placed This Encounter  Procedures   Care order/instruction    Transfuse Parameters    Standing Status:   Future    Standing Expiration Date:   08/30/2022   Informed Consent Details: Physician/Practitioner Attestation; Transcribe to consent form and obtain patient signature    Standing Status:   Future    Standing Expiration Date:   08/30/2022    Order Specific Question:   Physician/Practitioner attestation of informed consent for blood and or blood product transfusion    Answer:   I, the physician/practitioner, attest that I have discussed with the patient the benefits, risks, side effects, alternatives, likelihood of achieving goals and potential problems during recovery for the procedure that I have provided informed consent.    Order Specific Question:   Product(s)    Answer:   All Product(s)   Type and screen         Standing Status:   Future    Number of Occurrences:   1    Standing Expiration Date:   08/30/2022   Prepare RBC (crossmatch)    Standing Status:   Standing    Number of Occurrences:   1    Order Specific Question:   # of Units    Answer:   1 unit    Order Specific Question:   Transfusion Indications    Answer:   Symptomatic Anemia    Order Specific Question:   Number of Units to Keep Ahead    Answer:   NO units ahead    Order Specific Question:   Instructions:    Answer:   Transfuse  Order Specific Question:   If emergent release call blood bank    Answer:   Not emergent release    All questions were answered. The patient knows to call the clinic with any problems, questions or concerns. The total time spent in the appointment was 40 minutes encounter with patients including review of chart and various tests results, discussions about plan of care and coordination of care plan   Heath Lark, MD 08/30/2021 2:46 PM  INTERVAL HISTORY: Please see below for problem oriented charting. she returns for urgent  evaluation prior to stent exchange When I saw the patient recently, she was directed to the emergency department to be admitted I have reviewed extensive documentation from the emergency department at [facility] I asked the patient about the validity of the statement that she had declined some work-up and several instances where the patient had threatened to leave [place] The patient and her caregiver declined such situation and told me that they left because the doctor felt that she is safe to be discharged after receiving blood transfusion She continues to feel tired She went for preop assessment last Friday and received an urgent phone call on Monday asking [place] to transfuse the blood prior to her anticipated surgery tomorrow Unfortunately, by the time we received a message yesterday, it was too late for [place] to arrange for blood transfusion The earliest we can get her in is now and we can only arrange for 1 unit of blood today She denies further bleeding She continues to have significant discomfort and flank pain Denies nausea, vomiting or fever She has appointment scheduled to see a nephrologist in 2 days time  REVIEW OF SYSTEMS:   Constitutional: Denies fevers, chills or abnormal weight loss Eyes: Denies blurriness of vision Ears, nose, mouth, throat, and face: Denies mucositis or sore throat Respiratory: Denies cough, dyspnea or wheezes Cardiovascular: Denies palpitation, chest discomfort or lower extremity swelling Gastrointestinal:  Denies nausea, heartburn or change in bowel habits Skin: Denies abnormal skin rashes Lymphatics: Denies new lymphadenopathy or easy bruising Neurological:Denies numbness, tingling or new weaknesses Behavioral/Psych: Mood is stable, no new changes  All other systems were reviewed with the patient and are negative.  I have reviewed the past medical history, past surgical history, social history and family history with the patient and they are  unchanged from previous note.  ALLERGIES:  is allergic to imitrex [sumatriptan base] and cephalexin.  MEDICATIONS:  Current Outpatient Medications  Medication Sig Dispense Refill   acetaminophen (TYLENOL) 325 MG tablet Take 2 tablets (650 mg total) by mouth every 6 (six) hours as needed for mild pain, fever or headache. (Patient not taking: Reported on 08/19/2021)     omeprazole (PRILOSEC) 40 MG capsule Take 1 capsule (40 mg total) by mouth daily. (Patient not taking: Reported on 08/19/2021) 60 capsule 1   promethazine (PHENERGAN) 6.25 MG/5ML syrup Take 5 mLs (6.25 mg total) by mouth every 8 (eight) hours as needed for up to 10 days for nausea or vomiting. (Patient not taking: Reported on 08/19/2021) 120 mL 0   No current facility-administered medications for this visit.   Facility-Administered Medications Ordered in Other Visits  Medication Dose Route Frequency Provider Last Rate Last Admin   0.9 %  sodium chloride infusion (Manually program via Guardrails IV Fluids)  250 mL Intravenous Once Alvy Bimler, Chesni Vos, MD       acetaminophen (TYLENOL) tablet 650 mg  650 mg Oral Once Heath Lark, MD  diphenhydrAMINE (BENADRYL) capsule 25 mg  25 mg Oral Once Heath Lark, MD        SUMMARY OF ONCOLOGIC HISTORY: Oncology History  Cervical ca ([place])  09/27/2009 Initial Diagnosis   Cervical ca, IIB SCCA diagnosed in [facility]    - 01/27/2010 Radiation Therapy   Compelted chemorads and brachy   11/18/2010 Surgery   EUA  and biopsies negative   08/05/2021 Imaging   1. Bilateral inguinal lymphadenopathy. Associated multiple prominent retroperitoneal lymph nodes with an enlarged left periaortic lymph node measuring up to 1.4 cm. Markedly limited evaluation on this noncontrast study. Findings concerning for a patient with history of malignancy. 2. Bilateral ureteral stents in grossly appropriate position with associated right moderate and left severe hydronephrosis. 3. Stool throughout the colon. 4.  Aortic  Atherosclerosis (ICD10-I70.0).     08/12/2021 Cancer Staging   Staging form: Cervix Uteri, AJCC Version 9 - Clinical stage from 08/12/2021: Stage IVB (rcT3b, cN2a, cM1) - Signed by Heath Lark, MD on 08/12/2021 Stage prefix: Recurrence      PHYSICAL EXAMINATION: ECOG PERFORMANCE STATUS: 1 - Symptomatic but completely ambulatory  Vitals:   08/30/21 1409  BP: 134/69  Pulse: 96  Resp: 18  Temp: 97.9 F (36.6 C)  SpO2: 100%   Filed Weights   08/30/21 1409  Weight: 192 lb 3.2 oz (87.2 kg)    GENERAL:alert, no distress and comfortable SKIN: skin color, texture, turgor are normal, no rashes or significant lesions EYES: normal, Conjunctiva are pink and non-injected, sclera clear OROPHARYNX:no exudate, no erythema and lips, buccal mucosa, and tongue normal  NECK: supple, thyroid normal size, non-tender, without nodularity LYMPH:  no palpable lymphadenopathy in the cervical, axillary or inguinal LUNGS: clear to auscultation and percussion with normal breathing effort HEART: regular rate & rhythm and no murmurs and no lower extremity edema ABDOMEN:abdomen soft, non-tender and normal bowel sounds Musculoskeletal:no cyanosis of digits and no clubbing  NEURO: alert & oriented x 3 with fluent speech, no focal motor/sensory deficits  LABORATORY DATA:  I have reviewed the data as listed    Component Value Date/Time   NA 135 08/22/2020 0304   K 5.0 08/22/2020 0304   CL 108 08/22/2020 0304   CO2 11 (L) 08/22/2020 0304   GLUCOSE 77 08/22/2020 0304   BUN 50 (H) 08/22/2020 0304   CREATININE 4.56 (H) 08/22/2020 0304   CALCIUM 8.2 (L) 08/22/2020 0304   PROT 7.5 08/15/2020 0300   ALBUMIN 2.2 (L) 08/16/2020 0328   AST 9 (L) 08/15/2020 0300   ALT 8 08/15/2020 0300   ALKPHOS 91 08/15/2020 0300   BILITOT 0.6 08/15/2020 0300   GFRNONAA 12 (L) 08/22/2020 0304   GFRAA >60 11/13/2017 1109    No results found for: SPEP, UPEP  Lab Results  Component Value Date   WBC 7.0 08/30/2021    NEUTROABS 4.9 08/30/2021   HGB 5.5 (LL) 08/30/2021   HCT 18.4 (L) 08/30/2021   MCV 95.8 08/30/2021   PLT 264 08/30/2021      Chemistry      Component Value Date/Time   NA 135 08/22/2020 0304   K 5.0 08/22/2020 0304   CL 108 08/22/2020 0304   CO2 11 (L) 08/22/2020 0304   BUN 50 (H) 08/22/2020 0304   CREATININE 4.56 (H) 08/22/2020 0304      Component Value Date/Time   CALCIUM 8.2 (L) 08/22/2020 0304   ALKPHOS 91 08/15/2020 0300   AST 9 (L) 08/15/2020 0300   ALT 8 08/15/2020 0300  BILITOT 0.6 08/15/2020 0300       RADIOGRAPHIC STUDIES: I have personally reviewed the radiological images as listed and agreed with the findings in the report. CT ABDOMEN PELVIS WO CONTRAST  Result Date: 08/05/2021 CLINICAL DATA:  Hematuria x 2 weeks Hx of ureteral stents Gb Hx of cervical ca, chemo xrt EXAM: CT ABDOMEN AND PELVIS WITHOUT CONTRAST TECHNIQUE: Multidetector CT imaging of the abdomen and pelvis was performed following the standard protocol without IV contrast. COMPARISON:  None. FINDINGS: Lower chest: No acute abnormality. Cardiac changes suggestive of anemia. Hepatobiliary: No focal liver abnormality. Status post cholecystectomy. No biliary dilatation. Pancreas: No focal lesion. Normal pancreatic contour. No surrounding inflammatory changes. No main pancreatic ductal dilatation. Spleen: Normal in size without focal abnormality. Adrenals/Urinary Tract: No adrenal nodule bilaterally. Bilateral ureteral stents in grossly appropriate position with associated right moderate and left severe hydronephrosis. No nephrolithiasis bilaterally. No definite contour-deforming renal mass. No ureterolithiasis or hydroureter. The urinary bladder is unremarkable. Stomach/Bowel: Stomach is within normal limits. No evidence of bowel wall thickening or dilatation. Stool throughout the colon. Appendix appears normal. Vascular/Lymphatic: No abdominal aorta or iliac aneurysm. Mild atherosclerotic plaque. No abdominal or  pelvic lymphadenopathy. Bilateral inguinal lymphadenopathy measuring up to 2.5 cm on the left and 2.2 cm on the right. Multiple prominent retroperitoneal lymph nodes as well as the left periaortic 1.4 cm enlarged lymph node. Reproductive: Uterus and bilateral adnexa are unremarkable. Other: No intraperitoneal free fluid. No intraperitoneal free gas. No organized fluid collection. Musculoskeletal: No abdominal wall hernia or abnormality. Mild subcutaneus soft tissue edema of the hips. No suspicious lytic or blastic osseous lesions. No acute displaced fracture. IMPRESSION: 1. Bilateral inguinal lymphadenopathy. Associated multiple prominent retroperitoneal lymph nodes with an enlarged left periaortic lymph node measuring up to 1.4 cm. Markedly limited evaluation on this noncontrast study. Findings concerning for a patient with history of malignancy. 2. Bilateral ureteral stents in grossly appropriate position with associated right moderate and left severe hydronephrosis. 3. Stool throughout the colon. 4.  Aortic Atherosclerosis (ICD10-I70.0). These results will be called to the ordering clinician or representative by the Radiologist Assistant, and communication documented in the PACS or [employer]. Electronically Signed   By: Iven Finn M.D.   On: 08/05/2021 16:43

## 2021-08-30 NOTE — Progress Notes (Signed)
Per Dr. Alvy Bimler, after the first 15 mins of blood transfusion, you can infuse the blood at 300 ml/hr.

## 2021-08-30 NOTE — Progress Notes (Addendum)
Anesthesia Chart Review   Case: [id number] Date/Time: 08/31/21 0815   Procedure: CYSTOSCOPY WITH RETROGRADE PYELOGRAM/URETERAL STENT EXCHANGE (Bilateral)   Anesthesia type: General   Pre-op diagnosis: BILATERAL URETERAL OBSTRUCTION   Location: [facility]   Surgeons: Ceasar Mons, MD       DISCUSSION:41 y.o. every day smoker with h/o CKD stage IV, chronic multifactorial anemia, cervical cancer, bilateral ureteral obstruction scheduled for above procedure 08/31/21 with Dr. Harrell Gave Lovena Neighbours.   Pt admitted 1/15-1/17/2023 with acute on chronic anemia due to hematuria.  Stent in place. Received blood transfusion, hgn increased to 7.1.   Hemoglobin 6.8 at PAT visit.  Discussed with hematology and Dr. Lovena Neighbours.  Pt scheduled for transfusion prior to procedure on 08/30/2021.   Addendum 09/05/2021:  Pt was postponed from 08/31/21 to continue f/u and blood transfusions with hematology.   Received 1 unit 08/30/21 and 1 unit 08/31/21. She is scheduled for follow up with hematology in two weeks after stent exchange.  VS: BP (!) 145/79    Pulse 89    Temp 36.7 C (Oral)    Ht 5\' 5"  (1.651 m)    Wt 86.6 kg    SpO2 100%    BMI 31.78 kg/m   PROVIDERS: Pcp, No   LABS:  labs discussed with hematology and Dr. Lovena Neighbours (all labs ordered are listed, but only abnormal results are displayed)  Labs Reviewed  CBC - Abnormal; Notable for the following components:      Result Value   RBC 2.29 (*)    Hemoglobin 6.8 (*)    HCT 22.8 (*)    MCHC 29.8 (*)    RDW 15.6 (*)    All other components within normal limits     IMAGES:   EKG: 05/08/2020 Rate 100 bpm  Sinus tachycardia   CV:  Past Medical History:  Diagnosis Date   Anemia    Bilateral ureteral obstruction    Cancer (HCC)    Chronic kidney disease    History of cancer chemotherapy    completed 06/ 2011 for cervical cancer   History of cervical cancer 08/2009   followed by [facility] cancer center;  dx SCC Stage IIB,   completed chemoradiation 06/ 2011   History of radiation therapy    completed 06/ 2011 ffor cervical cancer   Migraine headache    Polysubstance abuse ([place]) 05/05/2020   heroin    Past Surgical History:  Procedure Laterality Date   CHOLECYSTECTOMY     CYSTOSCOPY W/ URETERAL STENT PLACEMENT Bilateral 08/15/2020   Procedure: CYSTOSCOPY WITH RETROGRADE PYELOGRAM/URETERAL STENT PLACEMENT;  Surgeon: Ceasar Mons, MD;  Location: [facility];  Service: Urology;  Laterality: Bilateral;   INCISION AND DRAINAGE ABSCESS  04/09/2003   @[facility]  ;  RIGHT SUBMANDIBULAR ABSCESS (LARGE) AND EXTRACTION MULTIPLE TEETH   IRRIGATION AND DEBRIDEMENT ABSCESS Bilateral 05/05/2020   Procedure: left forearm incision and drainage and aspiration of the right wrist joint;  Surgeon: Cindra Presume, MD;  Location: [facility];  Service: Plastics;  Laterality: Bilateral;    MEDICATIONS:  acetaminophen (TYLENOL) 325 MG tablet   omeprazole (PRILOSEC) 40 MG capsule   promethazine (PHENERGAN) 6.25 MG/5ML syrup   No current facility-administered medications for this encounter.    Konrad Felix Ward, PA-C [facility] [phone]

## 2021-08-30 NOTE — Assessment & Plan Note (Signed)
She was prescribed antibiotics and has symptoms of cystitis The stent needs to come out She is scheduled for stent removal tomorrow

## 2021-08-30 NOTE — Patient Instructions (Signed)
Blood Transfusion, Adult, Care After This sheet gives you information about how to care for yourself after your procedure. Your doctor may also give you more specific instructions. If you have problems or questions, contact your doctor. What can I expect after the procedure? After the procedure, it is common to have: Bruising and soreness at the IV site. A headache. Follow these instructions at home: Insertion site care   Follow instructions from your doctor about how to take care of your insertion site. This is where an IV tube was put into your vein. Make sure you: Wash your hands with soap and water before and after you change your bandage (dressing). If you cannot use soap and water, use hand sanitizer. Change your bandage as told by your doctor. Check your insertion site every day for signs of infection. Check for: Redness, swelling, or pain. Bleeding from the site. Warmth. Pus or a bad smell. General instructions Take over-the-counter and prescription medicines only as told by your doctor. Rest as told by your doctor. Go back to your normal activities as told by your doctor. Keep all follow-up visits as told by your doctor. This is important. Contact a doctor if: You have itching or red, swollen areas of skin (hives). You feel worried or nervous (anxious). You feel weak after doing your normal activities. You have redness, swelling, warmth, or pain around the insertion site. You have blood coming from the insertion site, and the blood does not stop with pressure. You have pus or a bad smell coming from the insertion site. Get help right away if: You have signs of a serious reaction. This may be coming from an allergy or the body's defense system (immune system). Signs include: Trouble breathing or shortness of breath. Swelling of the face or feeling warm (flushed). Fever or chills. Head, chest, or back pain. Dark pee (urine) or blood in the pee. Widespread rash. Fast  heartbeat. Feeling dizzy or light-headed. You may receive your blood transfusion in an outpatient setting. If so, you will be told whom to contact to report any reactions. These symptoms may be an emergency. Do not wait to see if the symptoms will go away. Get medical help right away. Call your local emergency services (911 in the [place] Do not drive yourself to the hospital. Summary Bruising and soreness at the IV site are common. Check your insertion site every day for signs of infection. Rest as told by your doctor. Go back to your normal activities as told by your doctor. Get help right away if you have signs of a serious reaction. This information is not intended to replace advice given to you by your health care provider. Make sure you discuss any questions you have with your health care provider. Document Revised: 11/11/2020 Document Reviewed: 01/09/2019 Elsevier Patient Education  2022 Elsevier Inc.  

## 2021-08-30 NOTE — Assessment & Plan Note (Signed)
She has multifactorial anemia, likely anemia of severe chronic renal failure, and related to possible infection in her stent She also have history of intermittent bleeding with hematuria She is profoundly anemic today I tried my best to explain to the patient and her friend the pathophysiology of severe anemia related to renal failure I will try to transfuse 2 units of blood this week, 1 unit today and 1 unit tomorrow if possible I have ordered additional work-up to rule out nutritional deficiency If nutrition of deficiency is not present, I will try to get her started on erythropoietin stimulating agent I will see her in 2 weeks for further follow-up We discussed some of the risks, benefits, and alternatives of blood transfusions. The patient is symptomatic from anemia and the hemoglobin level is critically low.  Some of the side-effects to be expected including risks of transfusion reactions, chills, infection, syndrome of volume overload and risk of hospitalization from various reasons and the patient is willing to proceed and went ahead to sign consent today.

## 2021-08-30 NOTE — Progress Notes (Signed)
CRITICAL VALUE STICKER  CRITICAL VALUE: Hgb 5.5  RECEIVER (on-site recipient of call): Harrel Lemon, RN  [place] NOTIFIED: [phone] 08/30/21  MESSENGER (representative from lab): Lawana Pai.  MD NOTIFIED: Dr. Alvy Bimler  TIME OF NOTIFICATION: 2:00 pm 08/30/21  RESPONSE:  Will order 1 unit of blood today and will schedule her for 1 more unit.

## 2021-08-30 NOTE — Telephone Encounter (Signed)
Called and left message for Marie Brewer, blood transfusion tomorrow 2/1 at 2 pm at [facility], arrive 20 mins early for 1 unit of blood. Keep blood bracelet on and do not remove. Ask her to call the office if needed.  Given schedule and above message to Duncan in the infusion room. She verbalized understanding.

## 2021-08-31 ENCOUNTER — Other Ambulatory Visit: Payer: Self-pay

## 2021-08-31 ENCOUNTER — Inpatient Hospital Stay: Payer: Self-pay | Attending: Hematology and Oncology

## 2021-08-31 DIAGNOSIS — Z79899 Other long term (current) drug therapy: Secondary | ICD-10-CM | POA: Insufficient documentation

## 2021-08-31 DIAGNOSIS — N184 Chronic kidney disease, stage 4 (severe): Secondary | ICD-10-CM | POA: Insufficient documentation

## 2021-08-31 DIAGNOSIS — Z87440 Personal history of urinary (tract) infections: Secondary | ICD-10-CM | POA: Insufficient documentation

## 2021-08-31 DIAGNOSIS — D539 Nutritional anemia, unspecified: Secondary | ICD-10-CM

## 2021-08-31 DIAGNOSIS — N179 Acute kidney failure, unspecified: Secondary | ICD-10-CM | POA: Insufficient documentation

## 2021-08-31 DIAGNOSIS — C539 Malignant neoplasm of cervix uteri, unspecified: Secondary | ICD-10-CM | POA: Insufficient documentation

## 2021-08-31 DIAGNOSIS — Z881 Allergy status to other antibiotic agents status: Secondary | ICD-10-CM | POA: Insufficient documentation

## 2021-08-31 DIAGNOSIS — R590 Localized enlarged lymph nodes: Secondary | ICD-10-CM | POA: Insufficient documentation

## 2021-08-31 DIAGNOSIS — L03119 Cellulitis of unspecified part of limb: Secondary | ICD-10-CM | POA: Insufficient documentation

## 2021-08-31 DIAGNOSIS — Z923 Personal history of irradiation: Secondary | ICD-10-CM | POA: Insufficient documentation

## 2021-08-31 DIAGNOSIS — D649 Anemia, unspecified: Secondary | ICD-10-CM | POA: Insufficient documentation

## 2021-08-31 DIAGNOSIS — N1330 Unspecified hydronephrosis: Secondary | ICD-10-CM | POA: Insufficient documentation

## 2021-08-31 LAB — PREPARE RBC (CROSSMATCH)

## 2021-08-31 MED ORDER — ACETAMINOPHEN 325 MG PO TABS
650.0000 mg | ORAL_TABLET | Freq: Once | ORAL | Status: AC
  Administered 2021-08-31: 650 mg via ORAL
  Filled 2021-08-31: qty 2

## 2021-08-31 MED ORDER — SODIUM CHLORIDE 0.9% IV SOLUTION: 250.0000 mL | Freq: Once | INTRAVENOUS | Status: DC

## 2021-08-31 MED ORDER — DIPHENHYDRAMINE HCL 25 MG PO CAPS
25.0000 mg | ORAL_CAPSULE | Freq: Once | ORAL | Status: AC
  Administered 2021-08-31: 25 mg via ORAL
  Filled 2021-08-31: qty 1

## 2021-08-31 NOTE — Patient Instructions (Signed)
Blood Transfusion, Adult, Care After This sheet gives you information about how to care for yourself after your procedure. Your doctor may also give you more specific instructions. If you have problems or questions, contact your doctor. What can I expect after the procedure? After the procedure, it is common to have: Bruising and soreness at the IV site. A headache. Follow these instructions at home: Insertion site care   Follow instructions from your doctor about how to take care of your insertion site. This is where an IV tube was put into your vein. Make sure you: Wash your hands with soap and water before and after you change your bandage (dressing). If you cannot use soap and water, use hand sanitizer. Change your bandage as told by your doctor. Check your insertion site every day for signs of infection. Check for: Redness, swelling, or pain. Bleeding from the site. Warmth. Pus or a bad smell. General instructions Take over-the-counter and prescription medicines only as told by your doctor. Rest as told by your doctor. Go back to your normal activities as told by your doctor. Keep all follow-up visits as told by your doctor. This is important. Contact a doctor if: You have itching or red, swollen areas of skin (hives). You feel worried or nervous (anxious). You feel weak after doing your normal activities. You have redness, swelling, warmth, or pain around the insertion site. You have blood coming from the insertion site, and the blood does not stop with pressure. You have pus or a bad smell coming from the insertion site. Get help right away if: You have signs of a serious reaction. This may be coming from an allergy or the body's defense system (immune system). Signs include: Trouble breathing or shortness of breath. Swelling of the face or feeling warm (flushed). Fever or chills. Head, chest, or back pain. Dark pee (urine) or blood in the pee. Widespread rash. Fast  heartbeat. Feeling dizzy or light-headed. You may receive your blood transfusion in an outpatient setting. If so, you will be told whom to contact to report any reactions. These symptoms may be an emergency. Do not wait to see if the symptoms will go away. Get medical help right away. Call your local emergency services (911 in the [place] Do not drive yourself to the hospital. Summary Bruising and soreness at the IV site are common. Check your insertion site every day for signs of infection. Rest as told by your doctor. Go back to your normal activities as told by your doctor. Get help right away if you have signs of a serious reaction. This information is not intended to replace advice given to you by your health care provider. Make sure you discuss any questions you have with your health care provider. Document Revised: 11/11/2020 Document Reviewed: 01/09/2019 Elsevier Patient Education  2022 Elsevier Inc.  

## 2021-09-01 ENCOUNTER — Other Ambulatory Visit: Payer: Self-pay

## 2021-09-01 ENCOUNTER — Other Ambulatory Visit: Payer: Self-pay | Admitting: Hematology and Oncology

## 2021-09-01 ENCOUNTER — Encounter (HOSPITAL_COMMUNITY): Payer: Self-pay | Admitting: Urology

## 2021-09-01 DIAGNOSIS — D539 Nutritional anemia, unspecified: Secondary | ICD-10-CM

## 2021-09-01 LAB — BPAM RBC
Blood Product Expiration Date: 202302122359
Blood Product Expiration Date: 202302192359
ISSUE DATE / TIME: 202301311542
ISSUE DATE / TIME: 202302011538
Unit Type and Rh: 9500
Unit Type and Rh: 9500

## 2021-09-01 LAB — TYPE AND SCREEN
ABO/RH(D): O NEG
Antibody Screen: NEGATIVE
Unit division: 0
Unit division: 0

## 2021-09-01 LAB — ERYTHROPOIETIN: Erythropoietin: 58.3 m[IU]/mL — ABNORMAL HIGH (ref 2.6–18.5)

## 2021-09-01 NOTE — Progress Notes (Signed)
Attempted to obtain medical history via telephone, unable to reach at this time. I left a voicemail to return pre surgical testing department's phone call.  

## 2021-09-02 NOTE — Progress Notes (Signed)
Attempted to obtain medical history via telephone, unable to reach at this time. I left a voicemail to return pre surgical testing department's phone call.  

## 2021-09-05 NOTE — Progress Notes (Signed)
Spoke to patient and gave her updated arrival time of 0745 for surgery on 09-07-21.  Patient was reminded to not eat solid food after midnight, and that she can have clear liquids from midnight until 0700.  Patient stated that she has had 6 blood transfusions over the past two weeks, and that she is feeling better with decreased weakness.  No other changes in medical history and all questions answered.

## 2021-09-06 ENCOUNTER — Telehealth: Payer: Self-pay

## 2021-09-06 NOTE — Telephone Encounter (Signed)
Marie Poet, NP  at [facility] is saw her today. Her hgb is 6.5. She is scheduled for stent exchange tomorrow at 8 am. Loma Sousa is asking for a call back. She is asking if [facility] can give blood transfusion today.  Per Dr. Alvy Bimler, instructed Loma Sousa, NP to send her to [facility] ED to be evaluated. Loma Sousa will call Tyshauna and instruct her to go to ED.

## 2021-09-07 ENCOUNTER — Ambulatory Visit (HOSPITAL_COMMUNITY): Payer: Self-pay | Admitting: Physician Assistant

## 2021-09-07 ENCOUNTER — Encounter (HOSPITAL_COMMUNITY): Payer: Self-pay | Admitting: Urology

## 2021-09-07 ENCOUNTER — Encounter (HOSPITAL_COMMUNITY)
Admission: RE | Disposition: A | Discharge status: Home / Self Care | Payer: Self-pay | Source: Ambulatory Visit | Attending: Urology

## 2021-09-07 ENCOUNTER — Ambulatory Visit (HOSPITAL_COMMUNITY)
Admission: RE | Admit: 2021-09-07 | Discharge: 2021-09-07 | Disposition: A | Discharge status: Home / Self Care | Payer: Self-pay | Source: Ambulatory Visit | Attending: Urology | Admitting: Urology

## 2021-09-07 ENCOUNTER — Ambulatory Visit (HOSPITAL_COMMUNITY): Payer: Self-pay

## 2021-09-07 DIAGNOSIS — Z8541 Personal history of malignant neoplasm of cervix uteri: Secondary | ICD-10-CM | POA: Insufficient documentation

## 2021-09-07 DIAGNOSIS — Z9221 Personal history of antineoplastic chemotherapy: Secondary | ICD-10-CM | POA: Insufficient documentation

## 2021-09-07 DIAGNOSIS — D649 Anemia, unspecified: Secondary | ICD-10-CM

## 2021-09-07 DIAGNOSIS — N131 Hydronephrosis with ureteral stricture, not elsewhere classified: Secondary | ICD-10-CM | POA: Insufficient documentation

## 2021-09-07 DIAGNOSIS — F17210 Nicotine dependence, cigarettes, uncomplicated: Secondary | ICD-10-CM | POA: Insufficient documentation

## 2021-09-07 DIAGNOSIS — Z01818 Encounter for other preprocedural examination: Secondary | ICD-10-CM

## 2021-09-07 DIAGNOSIS — Z923 Personal history of irradiation: Secondary | ICD-10-CM | POA: Insufficient documentation

## 2021-09-07 DIAGNOSIS — N189 Chronic kidney disease, unspecified: Secondary | ICD-10-CM | POA: Insufficient documentation

## 2021-09-07 HISTORY — PX: CYSTOSCOPY W/ URETERAL STENT PLACEMENT: SHX1429

## 2021-09-07 LAB — CBC
HCT: 22.3 % — ABNORMAL LOW (ref 36.0–46.0)
Hemoglobin: 6.7 g/dL — CL (ref 12.0–15.0)
MCH: 29.6 pg (ref 26.0–34.0)
MCHC: 30 g/dL (ref 30.0–36.0)
MCV: 98.7 fL (ref 80.0–100.0)
Platelets: 327 10*3/uL (ref 150–400)
RBC: 2.26 MIL/uL — ABNORMAL LOW (ref 3.87–5.11)
RDW: 17.6 % — ABNORMAL HIGH (ref 11.5–15.5)
WBC: 6.9 10*3/uL (ref 4.0–10.5)
nRBC: 0 % (ref 0.0–0.2)

## 2021-09-07 LAB — PREPARE RBC (CROSSMATCH)

## 2021-09-07 LAB — PREGNANCY, URINE: Preg Test, Ur: NEGATIVE

## 2021-09-07 SURGERY — CYSTOSCOPY, WITH RETROGRADE PYELOGRAM AND URETERAL STENT INSERTION
Anesthesia: General | Laterality: Bilateral

## 2021-09-07 MED ORDER — FENTANYL CITRATE PF 50 MCG/ML IJ SOSY
PREFILLED_SYRINGE | INTRAMUSCULAR | Status: AC
  Filled 2021-09-07: qty 3

## 2021-09-07 MED ORDER — PROPOFOL 10 MG/ML IV BOLUS
INTRAVENOUS | Status: DC | PRN
  Administered 2021-09-07: 50 mg via INTRAVENOUS
  Administered 2021-09-07: 150 mg via INTRAVENOUS

## 2021-09-07 MED ORDER — CIPROFLOXACIN IN D5W 400 MG/200ML IV SOLN
400.0000 mg | Freq: Once | INTRAVENOUS | Status: AC
  Administered 2021-09-07: 400 mg via INTRAVENOUS
  Filled 2021-09-07: qty 200

## 2021-09-07 MED ORDER — CHLORHEXIDINE GLUCONATE 0.12 % MT SOLN
15.0000 mL | Freq: Once | OROMUCOSAL | Status: AC
  Administered 2021-09-07: 15 mL via OROMUCOSAL

## 2021-09-07 MED ORDER — SCOPOLAMINE 1 MG/3DAYS TD PT72
1.0000 | MEDICATED_PATCH | TRANSDERMAL | Status: DC
  Administered 2021-09-07: 1.5 mg via TRANSDERMAL
  Filled 2021-09-07: qty 1

## 2021-09-07 MED ORDER — ONDANSETRON HCL 4 MG/2ML IJ SOLN: 4.0000 mg | Freq: Once | INTRAMUSCULAR | Status: DC | PRN

## 2021-09-07 MED ORDER — OXYCODONE HCL 5 MG PO TABS: 5.0000 mg | ORAL_TABLET | Freq: Once | ORAL | Status: DC | PRN

## 2021-09-07 MED ORDER — MIDAZOLAM HCL 5 MG/5ML IJ SOLN
INTRAMUSCULAR | Status: DC | PRN
Start: 2021-09-07 — End: 2021-09-07
  Administered 2021-09-07 (×2): 1 mg via INTRAVENOUS

## 2021-09-07 MED ORDER — FENTANYL CITRATE (PF) 100 MCG/2ML IJ SOLN
INTRAMUSCULAR | Status: AC
  Filled 2021-09-07: qty 2

## 2021-09-07 MED ORDER — LIDOCAINE HCL (PF) 2 % IJ SOLN
INTRAMUSCULAR | Status: AC
  Filled 2021-09-07: qty 5

## 2021-09-07 MED ORDER — AMISULPRIDE (ANTIEMETIC) 5 MG/2ML IV SOLN: 10.0000 mg | Freq: Once | INTRAVENOUS | Status: DC | PRN

## 2021-09-07 MED ORDER — SODIUM CHLORIDE 0.9 % IV SOLN: 10.0000 mL/h | Freq: Once | INTRAVENOUS | Status: AC

## 2021-09-07 MED ORDER — PROPOFOL 10 MG/ML IV BOLUS
INTRAVENOUS | Status: AC
  Filled 2021-09-07: qty 20

## 2021-09-07 MED ORDER — IOHEXOL 300 MG/ML  SOLN
INTRAMUSCULAR | Status: DC | PRN
  Administered 2021-09-07: 25 mL via URETHRAL

## 2021-09-07 MED ORDER — LIDOCAINE 2% (20 MG/ML) 5 ML SYRINGE
INTRAMUSCULAR | Status: DC | PRN
  Administered 2021-09-07: 60 mg via INTRAVENOUS

## 2021-09-07 MED ORDER — ACETAMINOPHEN 160 MG/5ML PO SOLN: 325.0000 mg | ORAL | Status: DC | PRN

## 2021-09-07 MED ORDER — ACETAMINOPHEN 500 MG PO TABS
1000.0000 mg | ORAL_TABLET | Freq: Once | ORAL | Status: AC
  Administered 2021-09-07: 1000 mg via ORAL
  Filled 2021-09-07: qty 2

## 2021-09-07 MED ORDER — SODIUM CHLORIDE 0.9 % IR SOLN
Status: DC | PRN
  Administered 2021-09-07: 3000 mL via INTRAVESICAL

## 2021-09-07 MED ORDER — SODIUM CHLORIDE (PF) 0.9 % IJ SOLN
INTRAMUSCULAR | Status: AC
  Filled 2021-09-07: qty 10

## 2021-09-07 MED ORDER — PROMETHAZINE HCL 25 MG/ML IJ SOLN: 6.2500 mg | INTRAMUSCULAR | Status: DC | PRN

## 2021-09-07 MED ORDER — ONDANSETRON HCL 4 MG/2ML IJ SOLN
INTRAMUSCULAR | Status: AC
  Filled 2021-09-07: qty 2

## 2021-09-07 MED ORDER — OXYCODONE HCL 5 MG/5ML PO SOLN: 5.0000 mg | Freq: Once | ORAL | Status: DC | PRN

## 2021-09-07 MED ORDER — DEXAMETHASONE SODIUM PHOSPHATE 10 MG/ML IJ SOLN
INTRAMUSCULAR | Status: AC
  Filled 2021-09-07: qty 1

## 2021-09-07 MED ORDER — DEXAMETHASONE SODIUM PHOSPHATE 10 MG/ML IJ SOLN
INTRAMUSCULAR | Status: DC | PRN
  Administered 2021-09-07: 10 mg via INTRAVENOUS

## 2021-09-07 MED ORDER — OXYBUTYNIN CHLORIDE 5 MG PO TABS: 5.0000 mg | ORAL_TABLET | Freq: Three times a day (TID) | ORAL | 1 refills | Status: DC | PRN

## 2021-09-07 MED ORDER — FENTANYL CITRATE PF 50 MCG/ML IJ SOSY: 25.0000 ug | PREFILLED_SYRINGE | INTRAMUSCULAR | Status: DC | PRN

## 2021-09-07 MED ORDER — CIPROFLOXACIN HCL 500 MG PO TABS: 500.0000 mg | ORAL_TABLET | Freq: Two times a day (BID) | ORAL | 0 refills | Status: AC

## 2021-09-07 MED ORDER — ACETAMINOPHEN 325 MG PO TABS: 325.0000 mg | ORAL_TABLET | ORAL | Status: DC | PRN

## 2021-09-07 MED ORDER — FENTANYL CITRATE (PF) 100 MCG/2ML IJ SOLN
INTRAMUSCULAR | Status: DC | PRN
Start: 2021-09-07 — End: 2021-09-07
  Administered 2021-09-07 (×2): 50 ug via INTRAVENOUS

## 2021-09-07 MED ORDER — CEFAZOLIN SODIUM-DEXTROSE 2-4 GM/100ML-% IV SOLN
2.0000 g | INTRAVENOUS | Status: DC
  Filled 2021-09-07: qty 100

## 2021-09-07 MED ORDER — ORAL CARE MOUTH RINSE: 15.0000 mL | Freq: Once | OROMUCOSAL | Status: AC

## 2021-09-07 MED ORDER — MEPERIDINE HCL 50 MG/ML IJ SOLN: 6.2500 mg | INTRAMUSCULAR | Status: DC | PRN

## 2021-09-07 MED ORDER — LACTATED RINGERS IV SOLN: INTRAVENOUS | Status: DC

## 2021-09-07 MED ORDER — OXYCODONE HCL 5 MG PO TABS
ORAL_TABLET | ORAL | Status: AC
  Filled 2021-09-07: qty 1

## 2021-09-07 SURGICAL SUPPLY — 10 items
BAG URO CATCHER STRL LF (MISCELLANEOUS) ×2 IMPLANT
CATH URETL OPEN 5X70 (CATHETERS) ×2 IMPLANT
CLOTH BEACON ORANGE TIMEOUT ST (SAFETY) ×2 IMPLANT
GLOVE SURG ENC TEXT LTX SZ7.5 (GLOVE) ×4 IMPLANT
GOWN STRL REUS W/TWL XL LVL3 (GOWN DISPOSABLE) ×3 IMPLANT
GUIDEWIRE ZIPWRE .038 STRAIGHT (WIRE) ×2 IMPLANT
MANIFOLD NEPTUNE II (INSTRUMENTS) ×2 IMPLANT
PACK CYSTO (CUSTOM PROCEDURE TRAY) ×2 IMPLANT
STENT URET 6FRX24 CONTOUR (STENTS) ×2 IMPLANT
TUBING CONNECTING 10 (TUBING) ×2 IMPLANT

## 2021-09-07 NOTE — Progress Notes (Signed)
CRITICAL RESULT PROVIDER NOTIFICATION  Test performed and critical result:  hgb 6.7  Date and time result received:  09/07/21 0915  Provider name/title: [facility] [employer] notified  Date and time provider notified: 0915 09/07/21  Date and time provider responded: 0918 09/07/21 Dr. Elgie Congo  Provider response:At bedside    Informed Dr.Bass of patient being really drowsy too.  Patient states she didn't take anything, she is just sleepy.   Dr. Elgie Congo ordered a type and screen just now.

## 2021-09-07 NOTE — H&P (Signed)
Urology Preoperative H&P   Chief Complaint: Retained ureteral stents  History of Present Illness: Marie Brewer is a 41 y.o. adult female who was initially seen in consultation on 08/15/2020 due to bilateral hydronephrosis thought to be secondary to distal ureteral obstruction from previous chemoradiation for treatment of cervical cancer. She was also noted to have an esophageal perforation at that time. She was scheduled for follow-up on 08/31/2020, but failed to do so. She is presents today with retained stents for almost 12 months.   Recent CT scan showed diffuse pelvic and abdominal lymphadenopathy concerning for metastatic disease vs inflammation.  Today, she reports intermittent episodes of gross hematuria for the past several weeks. She also notes seeing small amounts of stone debris intermittently pass in her urine.  She has required multiple blood transfusions due to chronic anemia thought to be secondary to blood loss as well as end-stage renal disease.  Past Medical History:  Diagnosis Date   Anemia    Bilateral ureteral obstruction    Cancer ([place])    Chronic kidney disease    History of blood transfusion 08/30/2021   1 unit   History of cancer chemotherapy    completed 06/ 2011 for cervical cancer   History of cervical cancer 08/2009   followed by [facility] center;  dx SCC Stage IIB,  completed chemoradiation 06/ 2011   History of radiation therapy    completed 06/ 2011 ffor cervical cancer   Migraine headache    Polysubstance abuse ([place]) 05/05/2020   heroin    Past Surgical History:  Procedure Laterality Date   CHOLECYSTECTOMY     CYSTOSCOPY W/ URETERAL STENT PLACEMENT Bilateral 08/15/2020   Procedure: CYSTOSCOPY WITH RETROGRADE PYELOGRAM/URETERAL STENT PLACEMENT;  Surgeon: Ceasar Mons, MD;  Location: [facility];  Service: Urology;  Laterality: Bilateral;   INCISION AND DRAINAGE ABSCESS  04/09/2003   @[facility]  ;  RIGHT SUBMANDIBULAR ABSCESS (LARGE) AND EXTRACTION  MULTIPLE TEETH   IRRIGATION AND DEBRIDEMENT ABSCESS Bilateral 05/05/2020   Procedure: left forearm incision and drainage and aspiration of the right wrist joint;  Surgeon: Cindra Presume, MD;  Location: [facility];  Service: Plastics;  Laterality: Bilateral;    Allergies:  Allergies  Allergen Reactions   Imitrex [Sumatriptan Base] Other (See Comments)    Heart races   Cephalexin Rash    Patient passed amoxicillin challenge on 08/18/20, no adverse effect    Family History  Problem Relation Age of Onset   Autism spectrum disorder Other     Social History:  reports that Clovia Reine has been smoking cigarettes. Shefali Ng has a 2.50 pack-year smoking history. Donnell Beauchamp has never used smokeless tobacco. Noland Fordyce reports that Katelyn Broadnax does not currently use drugs after having used the following drugs: Heroin. Karryn Kosinski reports that Noland Fordyce does not drink alcohol.  ROS: A complete review of systems was performed.  All systems are negative except for pertinent findings as noted.  Physical Exam:  Vital signs in last 24 hours: Temp:  [98.3 F (36.8 C)] 98.3 F (36.8 C) (02/08 0832) Pulse Rate:  [81] 81 (02/08 0832) Resp:  [18] 18 (02/08 0832) BP: (124)/(62) 124/62 (02/08 0832) SpO2:  [100 %] 100 % (02/08 0832) Weight:  [87.2 kg] 87.2 kg (02/08 0811) Constitutional:  Alert and oriented, No acute distress Cardiovascular: Regular rate and rhythm, No JVD Respiratory: Normal respiratory effort, Lungs clear bilaterally GI: Abdomen is soft, nontender, nondistended, no abdominal masses GU: No CVA tenderness Lymphatic: No lymphadenopathy Neurologic:  Grossly intact, no focal deficits Psychiatric: Normal mood and affect  Laboratory Data:  Recent Labs    09/07/21 0833  WBC 6.9  HGB 6.7*  HCT 22.3*  PLT 327    No results for input(s): NA, K, CL, GLUCOSE, BUN, CALCIUM, CREATININE in the last 72 hours.  Invalid input(s): CO3   Results for orders placed or performed  during the hospital encounter of 09/07/21 (from the past 24 hour(s))  Pregnancy, urine per protocol     Status: None   Collection Time: 09/07/21  8:08 AM  Result Value Ref Range   Preg Test, Ur NEGATIVE NEGATIVE  CBC     Status: Abnormal   Collection Time: 09/07/21  8:33 AM  Result Value Ref Range   WBC 6.9 4.0 - 10.5 K/uL   RBC 2.26 (L) 3.87 - 5.11 MIL/uL   Hemoglobin 6.7 (LL) 12.0 - 15.0 g/dL   HCT 22.3 (L) 36.0 - 46.0 %   MCV 98.7 80.0 - 100.0 fL   MCH 29.6 26.0 - 34.0 pg   MCHC 30.0 30.0 - 36.0 g/dL   RDW 17.6 (H) 11.5 - 15.5 %   Platelets 327 150 - 400 K/uL   nRBC 0.0 0.0 - 0.2 %   No results found for this or any previous visit (from the past 240 hour(s)).  Renal Function: No results for input(s): CREATININE in the last 168 hours. CrCl cannot be calculated (Patient's most recent lab result is older than the maximum 21 days allowed.).  Radiologic Imaging: CLINICAL DATA:  Hematuria x 2 weeks Hx of ureteral stents Gb Hx of cervical ca, chemo xrt   EXAM: CT ABDOMEN AND PELVIS WITHOUT CONTRAST   TECHNIQUE: Multidetector CT imaging of the abdomen and pelvis was performed following the standard protocol without IV contrast.   COMPARISON:  None.   FINDINGS: Lower chest: No acute abnormality. Cardiac changes suggestive of anemia.   Hepatobiliary: No focal liver abnormality. Status post cholecystectomy. No biliary dilatation.   Pancreas: No focal lesion. Normal pancreatic contour. No surrounding inflammatory changes. No main pancreatic ductal dilatation.   Spleen: Normal in size without focal abnormality.   Adrenals/Urinary Tract:   No adrenal nodule bilaterally.   Bilateral ureteral stents in grossly appropriate position with associated right moderate and left severe hydronephrosis. No nephrolithiasis bilaterally. No definite contour-deforming renal mass.   No ureterolithiasis or hydroureter.   The urinary bladder is unremarkable.   Stomach/Bowel: Stomach  is within normal limits. No evidence of bowel wall thickening or dilatation. Stool throughout the colon. Appendix appears normal.   Vascular/Lymphatic: No abdominal aorta or iliac aneurysm. Mild atherosclerotic plaque. No abdominal or pelvic lymphadenopathy. Bilateral inguinal lymphadenopathy measuring up to 2.5 cm on the left and 2.2 cm on the right. Multiple prominent retroperitoneal lymph nodes as well as the left periaortic 1.4 cm enlarged lymph node.   Reproductive: Uterus and bilateral adnexa are unremarkable.   Other: No intraperitoneal free fluid. No intraperitoneal free gas. No organized fluid collection.   Musculoskeletal:   No abdominal wall hernia or abnormality. Mild subcutaneus soft tissue edema of the hips.   No suspicious lytic or blastic osseous lesions. No acute displaced fracture.   IMPRESSION: 1. Bilateral inguinal lymphadenopathy. Associated multiple prominent retroperitoneal lymph nodes with an enlarged left periaortic lymph node measuring up to 1.4 cm. Markedly limited evaluation on this noncontrast study. Findings concerning for a patient with history of malignancy. 2. Bilateral ureteral stents in grossly appropriate position with associated right moderate and left severe  hydronephrosis. 3. Stool throughout the colon. 4.  Aortic Atherosclerosis (ICD10-I70.0).   These results will be called to the ordering clinician or representative by the Radiologist Assistant, and communication documented in the PACS or [employer].     Electronically Signed   By: Iven Finn M.D.   On: 08/05/2021 16:43    I independently reviewed the above imaging studies.  Assessment and Plan Allura Doepke is a 41 y.o. adult with retained bilateral ureteral stents  -The risks, benefits and alternatives of cystoscopy with BILATERAL JJ stent placement/exchange was discussed with the patient.  Risks include, but are not limited to: bleeding, urinary tract infection,  ureteral injury, ureteral stricture disease, chronic pain, urinary symptoms, bladder injury, stent migration, the need for nephrostomy tube placement, MI, CVA, DVT, PE and the inherent risks with general anesthesia.  The patient voices understanding and wishes to proceed.    Ellison Hughs, MD 09/07/2021, 9:46 AM  [facility] Pager: [phone]

## 2021-09-07 NOTE — Op Note (Signed)
Operative Note  Preoperative diagnosis:  1.  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  History of cervical cancer quiring chemotherapy and radiation  Postoperative diagnosis: 1.  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  History of cervical cancer quiring chemotherapy and radiation  Procedure(s): 1.  Cystoscopy with bilateral ureteral stent exchange 2.  Bilateral retrograde pyelograms with intraoperative interpretation of fluoroscopic imaging  Surgeon: Ellison Hughs, MD  Assistants:  None  Anesthesia:  General  Complications:  None  EBL: Less than 5 mL  Specimens: 1.  Previously placed bilateral ureteral stents were removed intact, inspected and discarded  Drains/Catheters: 1.  Bilateral 6 French, 24 cm JJ stents without tethers  Intraoperative findings:   Right retrograde pyelogram revealed a stenotic area in the distal aspects of the right ureter measuring approximately 4 to 5 cm with proximal dilation of the ureter, renal pelvis and its associated calyces.  No other intraluminal filling defects were identified. Left retrograde pyelogram revealed a stenotic area in the distal aspects of the left ureter measuring approximately 4 to 5 cm with proximal dilation of the ureter, renal pelvis and its associated calyces.  No other intraluminal filling defects were identified.  Indication:  Marie Brewer is a 41 y.o. adult with bilateral ureteral obstruction secondary to pelvic/retroperitoneal process following chemoradiation for the treatment of cervical cancer.  Her ureteral stents were initially placed in January 2022 and have been retained since that time.  She is here today for stent exchange.  She has been consented for the above procedures, voices understanding wish to proceed.  Description of procedure:  After informed consent was obtained, the patient  was brought to the operating room and general LMA anesthesia was administered. The patient was then placed in the dorsolithotomy position and prepped and draped in the usual sterile fashion. A timeout was performed. A 23 French rigid cystoscope was then inserted into the urethral meatus and advanced into the bladder under direct vision. A complete bladder survey revealed no intravesical pathology.  Her previously placed right ureteral stent was grasped at its distal curl and removed intact.  A 5 French ureteral catheter was then inserted into the right ureteral orifice and a retrograde pyelogram was obtained, with the findings listed above.  A Glidewire was then used to intubate the lumen of the ureteral catheter and was advanced up to the right renal pelvis, under fluoroscopic guidance.  The catheter was then removed, leaving the wire in place.  A new 6 [place], 24 cm JJ stent was then advanced over the wire and into good position within the right collecting system, confirming placement via fluoroscopy.  Her left ureteral stent was removed in a similar fashion.  A 5 French ureteral catheter was then inserted into the left ureteral orifice and a retrograde pyelogram was obtained, with the findings listed above.  A Glidewire was then used to intubate the lumen of the ureteral catheter and was advanced up to the left renal pelvis, under fluoroscopic guidance.  The catheter was then removed, leaving the wire in place.  A new 6 [place], 24 cm JJ stent was then advanced over the wire and into good position within the left collecting system, confirming placement via fluoroscopy.  The patient tolerated the procedure well and was transferred to the postanesthesia unit in stable condition where she will receive an additional 1 unit of packed red blood cells.  Plan: Discharge home.  The patient has follow-up on 12/01/2021  to plan her next stent exchange.

## 2021-09-07 NOTE — Transfer of Care (Signed)
Immediate Anesthesia Transfer of Care Note  Patient: Marie Brewer  Procedure(s) Performed: CYSTOSCOPY WITH RETROGRADE PYELOGRAM/URETERAL STENT EXCHANGE (Bilateral)  Patient Location: PACU  Anesthesia Type:General  Level of Consciousness: awake and alert   Airway & Oxygen Therapy: Patient Spontanous Breathing and Patient connected to face mask oxygen  Post-op Assessment: Report given to RN and Post -op Vital signs reviewed and stable  Post vital signs: Reviewed and stable  Last Vitals:  Vitals Value Taken Time  BP 136/60 09/07/21 1118  Temp    Pulse 102 09/07/21 1121  Resp 21 09/07/21 1121  SpO2 100 % 09/07/21 1121  Vitals shown include unvalidated device data.  Last Pain:  Vitals:   09/07/21 0832  TempSrc: Oral  PainSc: 6       Patients Stated Pain Goal: 5 (76/18/48 [id number])  Complications: No notable events documented.

## 2021-09-07 NOTE — Anesthesia Procedure Notes (Signed)
Procedure Name: LMA Insertion Date/Time: 09/07/2021 10:21 AM Performed by: Sharlette Dense, CRNA Patient Re-evaluated:Patient Re-evaluated prior to induction Oxygen Delivery Method: Circle system utilized Preoxygenation: Pre-oxygenation with 100% oxygen Induction Type: IV induction LMA: LMA inserted LMA Size: 4.0 Number of attempts: 1 Placement Confirmation: positive ETCO2 and breath sounds checked- equal and bilateral Tube secured with: Tape Dental Injury: Teeth and Oropharynx as per pre-operative assessment

## 2021-09-07 NOTE — Anesthesia Postprocedure Evaluation (Signed)
Anesthesia Post Note  Patient: Marie Brewer  Procedure(s) Performed: CYSTOSCOPY WITH RETROGRADE PYELOGRAM/URETERAL STENT EXCHANGE (Bilateral)     Patient location during evaluation: PACU Anesthesia Type: General Level of consciousness: awake Pain management: pain level controlled Vital Signs Assessment: post-procedure vital signs reviewed and stable Respiratory status: spontaneous breathing and respiratory function stable Cardiovascular status: stable Postop Assessment: no apparent nausea or vomiting Anesthetic complications: no   No notable events documented.  Last Vitals:  Vitals:   09/07/21 1315 09/07/21 1330  BP: 128/73 122/80  Pulse: (!) 58   Resp: 12 11  Temp:    SpO2: 98%     Last Pain:  Vitals:   09/07/21 1300  TempSrc: Oral  PainSc: [place]

## 2021-09-08 ENCOUNTER — Encounter (HOSPITAL_COMMUNITY): Payer: Self-pay | Admitting: Urology

## 2021-09-08 LAB — TYPE AND SCREEN
ABO/RH(D): O NEG
Antibody Screen: NEGATIVE
Unit division: 0

## 2021-09-08 LAB — BPAM RBC
Blood Product Expiration Date: 202302222359
ISSUE DATE / TIME: 202302081037
Unit Type and Rh: 9500

## 2021-09-12 ENCOUNTER — Inpatient Hospital Stay: Payer: Self-pay

## 2021-09-12 ENCOUNTER — Inpatient Hospital Stay: Payer: Self-pay | Admitting: Hematology and Oncology

## 2021-09-12 ENCOUNTER — Encounter: Payer: Self-pay | Admitting: Hematology and Oncology

## 2021-09-12 ENCOUNTER — Telehealth: Payer: Self-pay | Admitting: Hematology and Oncology

## 2021-09-12 ENCOUNTER — Telehealth: Payer: Self-pay

## 2021-09-12 NOTE — Telephone Encounter (Signed)
Called and left a message asking her to call the office back. She is late for today's appts.

## 2021-09-12 NOTE — Telephone Encounter (Signed)
Called regarding missed appt today that are now rescheduled for tomorrow. She is interested in the transportation program. Sent message to have someone call her.

## 2021-09-12 NOTE — Telephone Encounter (Signed)
Rescheduled appointments per patient. Advised patient about new appointment times patient said she will be here.

## 2021-09-13 ENCOUNTER — Telehealth: Payer: Self-pay

## 2021-09-13 ENCOUNTER — Inpatient Hospital Stay: Payer: Self-pay

## 2021-09-13 ENCOUNTER — Telehealth: Payer: Self-pay | Admitting: Hematology and Oncology

## 2021-09-13 ENCOUNTER — Inpatient Hospital Stay: Payer: Self-pay | Admitting: Hematology and Oncology

## 2021-09-13 NOTE — Telephone Encounter (Signed)
Loren said yesterday we have availability to transfuse on Thursday, check with her first before we offer her an appointment on Thursday

## 2021-09-13 NOTE — Telephone Encounter (Signed)
Sent scheduling message to reschedule todays missed appts.

## 2021-09-13 NOTE — Telephone Encounter (Signed)
Called regarding today's appts and being late. She said transportation never showed up to bring her to appts. Appts canceled for today. Checked with transportation and they sent someone to pick her up and she was a no show at 1138.

## 2021-09-13 NOTE — Telephone Encounter (Signed)
Rescheduled appointment per providers approval.Patient is aware of new appointment and is aware that she has to call transportation.

## 2021-09-15 ENCOUNTER — Encounter: Payer: Self-pay | Admitting: Hematology and Oncology

## 2021-09-15 ENCOUNTER — Inpatient Hospital Stay (HOSPITAL_BASED_OUTPATIENT_CLINIC_OR_DEPARTMENT_OTHER): Payer: Self-pay | Admitting: Hematology and Oncology

## 2021-09-15 ENCOUNTER — Other Ambulatory Visit: Payer: Self-pay | Admitting: *Deleted

## 2021-09-15 ENCOUNTER — Other Ambulatory Visit: Payer: Self-pay

## 2021-09-15 ENCOUNTER — Telehealth: Payer: Self-pay

## 2021-09-15 ENCOUNTER — Inpatient Hospital Stay: Payer: Medicaid Other

## 2021-09-15 VITALS — BP 145/70 | HR 95 | Temp 98.4°F | Resp 18 | Ht 65.0 in | Wt 189.2 lb

## 2021-09-15 DIAGNOSIS — D649 Anemia, unspecified: Secondary | ICD-10-CM

## 2021-09-15 DIAGNOSIS — C539 Malignant neoplasm of cervix uteri, unspecified: Secondary | ICD-10-CM

## 2021-09-15 DIAGNOSIS — D539 Nutritional anemia, unspecified: Secondary | ICD-10-CM

## 2021-09-15 DIAGNOSIS — N179 Acute kidney failure, unspecified: Secondary | ICD-10-CM

## 2021-09-15 LAB — CBC WITH DIFFERENTIAL/PLATELET
Abs Immature Granulocytes: 0.04 10*3/uL (ref 0.00–0.07)
Basophils Absolute: 0 10*3/uL (ref 0.0–0.1)
Basophils Relative: 0 %
Eosinophils Absolute: 0.3 10*3/uL (ref 0.0–0.5)
Eosinophils Relative: 4 %
HCT: 20.4 % — ABNORMAL LOW (ref 36.0–46.0)
Hemoglobin: 6.3 g/dL — CL (ref 12.0–15.0)
Immature Granulocytes: 1 %
Lymphocytes Relative: 16 %
Lymphs Abs: 1.2 10*3/uL (ref 0.7–4.0)
MCH: 29.3 pg (ref 26.0–34.0)
MCHC: 30.9 g/dL (ref 30.0–36.0)
MCV: 94.9 fL (ref 80.0–100.0)
Monocytes Absolute: 0.7 10*3/uL (ref 0.1–1.0)
Monocytes Relative: 9 %
Neutro Abs: 5.6 10*3/uL (ref 1.7–7.7)
Neutrophils Relative %: 70 %
Platelets: 334 10*3/uL (ref 150–400)
RBC: 2.15 MIL/uL — ABNORMAL LOW (ref 3.87–5.11)
RDW: 17.2 % — ABNORMAL HIGH (ref 11.5–15.5)
WBC: 7.8 10*3/uL (ref 4.0–10.5)
nRBC: 0 % (ref 0.0–0.2)

## 2021-09-15 LAB — SAMPLE TO BLOOD BANK

## 2021-09-15 MED ORDER — ACETAMINOPHEN 325 MG PO TABS: 650.0000 mg | ORAL_TABLET | Freq: Once | ORAL | Status: AC

## 2021-09-15 NOTE — Telephone Encounter (Signed)
Unable to find her for blood transfusion appt. She saw Dr. Alvy Bimler then went to scheduling. She left without saying anything to anyone. Left a message asking her to call the office back.

## 2021-09-15 NOTE — Progress Notes (Signed)
[place] OFFICE PROGRESS NOTE  Patient Care Team: Pcp, No as PCP - General  ASSESSMENT & PLAN:  Cervical ca ([place]) I examined her She has superficial bilateral inguinal lymphadenopathy that are small and discrete, could be reactive in nature The patient have recurrent cellulitis of the lower extremity and recent UTI Overall, I think she can be observed closely I plan to examine her again in 2 weeks If she had persistent signs of cellulitis, I prefer to treat her with antibiotics first before jumping to conclusion that she has recurrent cancer  Anemia, unspecified She has multifactorial anemia, likely anemia of severe chronic renal failure, and related to possible infection in her stent She also have history of intermittent bleeding with hematuria, none recently She is profoundly anemic today I tried my best to explain to the patient and her friend the pathophysiology of severe anemia related to renal failure  We discussed some of the risks, benefits, and alternatives of blood transfusions. The patient is symptomatic from anemia and the hemoglobin level is critically low.  Some of the side-effects to be expected including risks of transfusion reactions, chills, infection, syndrome of volume overload and risk of hospitalization from various reasons and the patient is willing to proceed and went ahead to sign consent today.  This is likely anemia of chronic disease. The patient denies recent history of bleeding such as epistaxis, hematuria or hematochezia. She is symptomatic from the anemia.  We discussed the risks, benefits, side effects of erythropoietin stimulating agents for anemia, with the goal of keeping the hemoglobin greater than 10 g. I discussed with the patient and potential side effects such as risk of thrombosis, severe hypertension, risk of congestive heart failure and stroke and she agreed to proceed. I will start initiial dosing at 200 mcg every 2 weeks and we  will reassess her response rates after 3 treatments to assess whether dosage adjustment is needed.     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 She was seen by nephrologist recently I have reviewed the documentation She has appointment to see them again in a month for further follow-up   Orders Placed This Encounter  Procedures   Care order/instruction    Transfuse Parameters    Standing Status:   Future    Number of Occurrences:   1    Standing Expiration Date:   09/15/2022   Informed Consent Details: Physician/Practitioner Attestation; Transcribe to consent form and obtain patient signature    Standing Status:   Future    Number of Occurrences:   1    Standing Expiration Date:   09/15/2022    Order Specific Question:   Physician/Practitioner attestation of informed consent for blood and or blood product transfusion    Answer:   I, the physician/practitioner, attest that I have discussed with the patient the benefits, risks, side effects, alternatives, likelihood of achieving goals and potential problems during recovery for the procedure that I have provided informed consent.    Order Specific Question:   Product(s)    Answer:   All Product(s)   Type and screen         Standing Status:   Future    Number of Occurrences:   1    Standing Expiration Date:   09/15/2022   Prepare RBC (crossmatch)    Standing Status:   Standing    Number of Occurrences:   1    Order  Specific Question:   # of Units    Answer:   1 unit    Order Specific Question:   Transfusion Indications    Answer:   Symptomatic Anemia    Order Specific Question:   Number of Units to Keep Ahead    Answer:   NO units ahead    Order Specific Question:   Instructions:    Answer:   Transfuse    Order Specific Question:   If emergent release call blood bank    Answer:   Not emergent release    All questions were answered. The patient knows to  call the clinic with any problems, questions or concerns. The total time spent in the appointment was 40 minutes encounter with patients including review of chart and various tests results, discussions about plan of care and coordination of care plan   Heath Lark, MD 09/15/2021 1:54 PM  INTERVAL HISTORY: Please see below for problem oriented charting. she returns for further evaluation for multifactorial anemia, renal failure and history of cervical cancer The patient's appointment has been rescheduled 3 times She went and have stent exchange last week followed by blood transfusion She saw nephrologist recently with close monitoring and follow-up Since last time I saw her, she denies further bleeding She does have recurrent cellulitis on the lower extremity and she put bandages around it No other signs of infection  REVIEW OF SYSTEMS:   Constitutional: Denies fevers, chills or abnormal weight loss Eyes: Denies blurriness of vision Ears, nose, mouth, throat, and face: Denies mucositis or sore throat Respiratory: Denies cough, dyspnea or wheezes Cardiovascular: Denies palpitation, chest discomfort or lower extremity swelling Gastrointestinal:  Denies nausea, heartburn or change in bowel habits Skin: Denies abnormal skin rashes Lymphatics: Denies new lymphadenopathy or easy bruising Neurological:Denies numbness, tingling or new weaknesses Behavioral/Psych: Mood is stable, no new changes  All other systems were reviewed with the patient and are negative.  I have reviewed the past medical history, past surgical history, social history and family history with the patient and they are unchanged from previous note.  ALLERGIES:  is allergic to imitrex [sumatriptan base] and cephalexin.  MEDICATIONS:  Current Outpatient Medications  Medication Sig Dispense Refill   oxybutynin (DITROPAN) 5 MG tablet Take 1 tablet (5 mg total) by mouth every 8 (eight) hours as needed for bladder spasms. 30  tablet 1   No current facility-administered medications for this visit.   Facility-Administered Medications Ordered in Other Visits  Medication Dose Route Frequency Provider Last Rate Last Admin   acetaminophen (TYLENOL) tablet 650 mg  650 mg Oral Once Heath Lark, MD        SUMMARY OF ONCOLOGIC HISTORY: Oncology History  Cervical ca ([place])  09/27/2009 Initial Diagnosis   Cervical ca, IIB SCCA diagnosed in [facility]    - 01/27/2010 Radiation Therapy   Compelted chemorads and brachy   11/18/2010 Surgery   EUA  and biopsies negative   08/05/2021 Imaging   1. Bilateral inguinal lymphadenopathy. Associated multiple prominent retroperitoneal lymph nodes with an enlarged left periaortic lymph node measuring up to 1.4 cm. Markedly limited evaluation on this noncontrast study. Findings concerning for a patient with history of malignancy. 2. Bilateral ureteral stents in grossly appropriate position with associated right moderate and left severe hydronephrosis. 3. Stool throughout the colon. 4.  Aortic Atherosclerosis (ICD10-I70.0).     08/12/2021 Cancer Staging   Staging form: Cervix Uteri, AJCC Version 9 - Clinical stage from 08/12/2021: Stage IVB (rcT3b, cN2a, cM1) -  Signed by Heath Lark, MD on 08/12/2021 Stage prefix: Recurrence    09/07/2021 Procedure   Preoperative diagnosis:  1.  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  History of cervical cancer quiring chemotherapy and radiation   Postoperative diagnosis: 1.  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  History of cervical cancer quiring chemotherapy and radiation   Procedure(s): 1.  Cystoscopy with bilateral ureteral stent exchange 2.  Bilateral retrograde pyelograms with intraoperative interpretation of fluoroscopic imaging   Surgeon: Ellison Hughs, MD Specimens: 1.  Previously placed bilateral ureteral  stents were removed intact, inspected and discarded   Drains/Catheters: 1.  Bilateral 6 French, 24 cm JJ stents without tethers   Intraoperative findings:   Right retrograde pyelogram revealed a stenotic area in the distal aspects of the right ureter measuring approximately 4 to 5 cm with proximal dilation of the ureter, renal pelvis and its associated calyces.  No other intraluminal filling defects were identified. Left retrograde pyelogram revealed a stenotic area in the distal aspects of the left ureter measuring approximately 4 to 5 cm with proximal dilation of the ureter, renal pelvis and its associated calyces.  No other intraluminal filling defects were identified.       PHYSICAL EXAMINATION: ECOG PERFORMANCE STATUS: 2 - Symptomatic, <50% confined to bed  Vitals:   09/15/21 1343  BP: (!) 145/70  Pulse: 95  Resp: 18  Temp: 98.4 F (36.9 C)  SpO2: 100%   Filed Weights   09/15/21 1343  Weight: 189 lb 3.2 oz (85.8 kg)    GENERAL:alert, no distress and comfortable.  She looks very pale SKIN: I was not able to examine her skin on the lower extremity due to bandages EYES: normal, Conjunctiva are pink and non-injected, sclera clear OROPHARYNX:no exudate, no erythema and lips, buccal mucosa, and tongue normal  NECK: supple, thyroid normal size, non-tender, without nodularity LYMPH: She has palpable superficial lymphadenopathy especially on the right side but palpable from both site. LUNGS: clear to auscultation and percussion with normal breathing effort HEART: regular rate & rhythm and no murmurs and no lower extremity edema ABDOMEN:abdomen soft, non-tender and normal bowel sounds Musculoskeletal:no cyanosis of digits and no clubbing  NEURO: alert & oriented x 3 with fluent speech, no focal motor/sensory deficits  LABORATORY DATA:  I have reviewed the data as listed    Component Value Date/Time   NA 135 08/22/2020 0304   K 5.0 08/22/2020 0304   CL 108 08/22/2020 0304    CO2 11 (L) 08/22/2020 0304   GLUCOSE 77 08/22/2020 0304   BUN 50 (H) 08/22/2020 0304   CREATININE 4.56 (H) 08/22/2020 0304   CALCIUM 8.2 (L) 08/22/2020 0304   PROT 7.5 08/15/2020 0300   ALBUMIN 2.2 (L) 08/16/2020 0328   AST 9 (L) 08/15/2020 0300   ALT 8 08/15/2020 0300   ALKPHOS 91 08/15/2020 0300   BILITOT 0.6 08/15/2020 0300   GFRNONAA 12 (L) 08/22/2020 0304   GFRAA >60 11/13/2017 1109    No results found for: SPEP, UPEP  Lab Results  Component Value Date   WBC 7.8 09/15/2021   NEUTROABS 5.6 09/15/2021   HGB 6.3 (LL) 09/15/2021   HCT 20.4 (L) 09/15/2021   MCV 94.9 09/15/2021   PLT 334 09/15/2021      Chemistry      Component Value Date/Time   NA 135 08/22/2020 0304   K 5.0 08/22/2020 0304   CL 108 08/22/2020 0304  CO2 11 (L) 08/22/2020 0304   BUN 50 (H) 08/22/2020 0304   CREATININE 4.56 (H) 08/22/2020 0304      Component Value Date/Time   CALCIUM 8.2 (L) 08/22/2020 0304   ALKPHOS 91 08/15/2020 0300   AST 9 (L) 08/15/2020 0300   ALT 8 08/15/2020 0300   BILITOT 0.6 08/15/2020 0300

## 2021-09-15 NOTE — Assessment & Plan Note (Signed)
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

## 2021-09-15 NOTE — Assessment & Plan Note (Signed)
I examined her She has superficial bilateral inguinal lymphadenopathy that are small and discrete, could be reactive in nature The patient have recurrent cellulitis of the lower extremity and recent UTI Overall, I think she can be observed closely I plan to examine her again in 2 weeks If she had persistent signs of cellulitis, I prefer to treat her with antibiotics first before jumping to conclusion that she has recurrent cancer

## 2021-09-15 NOTE — Assessment & Plan Note (Signed)
She has multifactorial anemia, likely anemia of severe chronic renal failure, and related to possible infection in her stent She also have history of intermittent bleeding with hematuria, none recently She is profoundly anemic today I tried my best to explain to the patient and her friend the pathophysiology of severe anemia related to renal failure  We discussed some of the risks, benefits, and alternatives of blood transfusions. The patient is symptomatic from anemia and the hemoglobin level is critically low.  Some of the side-effects to be expected including risks of transfusion reactions, chills, infection, syndrome of volume overload and risk of hospitalization from various reasons and the patient is willing to proceed and went ahead to sign consent today.  This is likely anemia of chronic disease. The patient denies recent history of bleeding such as epistaxis, hematuria or hematochezia. She is symptomatic from the anemia.  We discussed the risks, benefits, side effects of erythropoietin stimulating agents for anemia, with the goal of keeping the hemoglobin greater than 10 g. I discussed with the patient and potential side effects such as risk of thrombosis, severe hypertension, risk of congestive heart failure and stroke and she agreed to proceed. I will start initiial dosing at 200 mcg every 2 weeks and we will reassess her response rates after 3 treatments to assess whether dosage adjustment is needed.

## 2021-09-15 NOTE — Telephone Encounter (Signed)
CRITICAL VALUE STICKER  CRITICAL VALUE: Hgb = 6.3  RECEIVER (on-site recipient of call): Yetta Glassman, CMA  DATE & TIME NOTIFIED: 09/15/21 at 1:17pm  MESSENGER (representative from lab): Heather  MD NOTIFIED: [facility]  TIME OF NOTIFICATION: 09/15/21 at 2:20pm  RESPONSE: Notification provided to Dr. Alvy Bimler and Elnita Maxwell., RN of follow-up with pt.

## 2021-09-30 ENCOUNTER — Inpatient Hospital Stay: Payer: Self-pay

## 2021-09-30 ENCOUNTER — Inpatient Hospital Stay: Payer: Self-pay | Admitting: Hematology and Oncology

## 2021-09-30 ENCOUNTER — Telehealth: Payer: Self-pay | Admitting: Hematology and Oncology

## 2021-09-30 NOTE — Telephone Encounter (Signed)
Patient called in because she needed to cancel her appointment for today due to thinking that she has the stomach flu. Patient was scheduled to have labs, MD and blood. Scheduler placed patient on hold while scheduler went to talk to nurse about the situation. Once scheduler tried updating the patient, the patient did not answer back. After a couple tries, the scheduler hung up. Tried calling patient back twice. First time, no answer. The second time, the call went straight to voicemail.  ?

## 2021-10-18 ENCOUNTER — Ambulatory Visit: Payer: Medicaid Other | Admitting: Internal Medicine

## 2021-10-19 ENCOUNTER — Telehealth: Payer: Self-pay | Admitting: Hematology and Oncology

## 2021-10-19 NOTE — Telephone Encounter (Signed)
.  Called patient to schedule appointment per 3/20 inbasket, patient is aware of date and time.  Patient is aware of Dr.Gorsuch order that if she is late she will not be seen. ?

## 2021-10-25 ENCOUNTER — Inpatient Hospital Stay: Payer: Self-pay

## 2021-10-25 ENCOUNTER — Inpatient Hospital Stay: Payer: Self-pay | Attending: Hematology and Oncology

## 2021-10-25 ENCOUNTER — Other Ambulatory Visit: Payer: Self-pay

## 2021-10-25 ENCOUNTER — Encounter: Payer: Self-pay | Admitting: Hematology and Oncology

## 2021-10-25 ENCOUNTER — Inpatient Hospital Stay (HOSPITAL_BASED_OUTPATIENT_CLINIC_OR_DEPARTMENT_OTHER): Payer: Medicaid Other | Admitting: Hematology and Oncology

## 2021-10-25 VITALS — BP 142/82 | HR 83 | Temp 98.2°F | Resp 18 | Ht 65.0 in | Wt 188.4 lb

## 2021-10-25 DIAGNOSIS — C539 Malignant neoplasm of cervix uteri, unspecified: Secondary | ICD-10-CM

## 2021-10-25 DIAGNOSIS — D539 Nutritional anemia, unspecified: Secondary | ICD-10-CM

## 2021-10-25 DIAGNOSIS — Z881 Allergy status to other antibiotic agents status: Secondary | ICD-10-CM | POA: Insufficient documentation

## 2021-10-25 DIAGNOSIS — D649 Anemia, unspecified: Secondary | ICD-10-CM

## 2021-10-25 DIAGNOSIS — N179 Acute kidney failure, unspecified: Secondary | ICD-10-CM | POA: Insufficient documentation

## 2021-10-25 DIAGNOSIS — N1330 Unspecified hydronephrosis: Secondary | ICD-10-CM | POA: Insufficient documentation

## 2021-10-25 DIAGNOSIS — Z79899 Other long term (current) drug therapy: Secondary | ICD-10-CM | POA: Insufficient documentation

## 2021-10-25 LAB — CBC WITH DIFFERENTIAL/PLATELET
Abs Immature Granulocytes: 0.03 10*3/uL (ref 0.00–0.07)
Basophils Absolute: 0.1 10*3/uL (ref 0.0–0.1)
Basophils Relative: 1 %
Eosinophils Absolute: 0.3 10*3/uL (ref 0.0–0.5)
Eosinophils Relative: 4 %
HCT: 26.4 % — ABNORMAL LOW (ref 36.0–46.0)
Hemoglobin: 8.4 g/dL — ABNORMAL LOW (ref 12.0–15.0)
Immature Granulocytes: 0 %
Lymphocytes Relative: 20 %
Lymphs Abs: 1.7 10*3/uL (ref 0.7–4.0)
MCH: 29.7 pg (ref 26.0–34.0)
MCHC: 31.8 g/dL (ref 30.0–36.0)
MCV: 93.3 fL (ref 80.0–100.0)
Monocytes Absolute: 0.6 10*3/uL (ref 0.1–1.0)
Monocytes Relative: 7 %
Neutro Abs: 5.8 10*3/uL (ref 1.7–7.7)
Neutrophils Relative %: 68 %
Platelets: 308 10*3/uL (ref 150–400)
RBC: 2.83 MIL/uL — ABNORMAL LOW (ref 3.87–5.11)
RDW: 14.6 % (ref 11.5–15.5)
WBC: 8.5 10*3/uL (ref 4.0–10.5)
nRBC: 0 % (ref 0.0–0.2)

## 2021-10-25 LAB — SAMPLE TO BLOOD BANK

## 2021-10-25 MED ORDER — DARBEPOETIN ALFA 300 MCG/0.6ML IJ SOSY
300.0000 ug | PREFILLED_SYRINGE | Freq: Once | INTRAMUSCULAR | Status: AC
  Administered 2021-10-25: 300 ug via SUBCUTANEOUS
  Filled 2021-10-25: qty 0.6

## 2021-10-25 NOTE — Progress Notes (Signed)
[place] ?OFFICE PROGRESS NOTE ? ?Patient Care Team: ?Pcp, No as PCP - General ? ?ASSESSMENT & PLAN:  ?Cervical ca ([place]) ?She had remote history of cervical cancer, recently found to have abnormal imaging study, could be related to infection ?I recommend repeat CT imaging of the abdomen and pelvis for evaluation and she is in agreement ? ?Anemia, unspecified ?She has multifactorial anemia, likely due to anemia chronic kidney failure ?I am surprised to see that her anemia improved without recent blood transfusion support ?I recommend we proceed with darbepoetin injection and she is in agreement ?I plan to recheck her blood count again in 2 weeks for further follow-up ? ?Acute renal failure (ARF) (HCC) ?The patient have chronic kidney failure stage IV and is currently on close monitoring with nephrologist ?She is not symptomatic ? ?Orders Placed This Encounter  ?Procedures  ? CT Abdomen Pelvis Wo Contrast  ?  Standing Status:   Future  ?  Standing Expiration Date:   10/25/2022  ?  Order Specific Question:   Is patient pregnant?  ?  Answer:   No  ?  Order Specific Question:   Preferred imaging location?  ?  Answer:   [facility]  ?  Order Specific Question:   Is Oral Contrast requested for this exam?  ?  Answer:   Yes, Per Radiology protocol  ? ? ?All questions were answered. The patient knows to call the clinic with any problems, questions or concerns. ?The total time spent in the appointment was 20 minutes encounter with patients including review of chart and various tests results, discussions about plan of care and coordination of care plan ?  ?Heath Lark, MD ?10/25/2021 3:24 PM ? ?INTERVAL HISTORY: ?Please see below for problem oriented charting. ?she returns for treatment follow-up  ?The patient missed her appointment recently and cancel her transfusions appointment ?She called back recently and resume follow-up. ?She denies recent bleeding ?She felt fine ?No recent hematuria ?She saw her  nephrologist recently with every 4 months follow-up ? ?REVIEW OF SYSTEMS:   ?Constitutional: Denies fevers, chills or abnormal weight loss ?Eyes: Denies blurriness of vision ?Ears, nose, mouth, throat, and face: Denies mucositis or sore throat ?Respiratory: Denies cough, dyspnea or wheezes ?Cardiovascular: Denies palpitation, chest discomfort or lower extremity swelling ?Gastrointestinal:  Denies nausea, heartburn or change in bowel habits ?Skin: Denies abnormal skin rashes ?Lymphatics: Denies new lymphadenopathy or easy bruising ?Neurological:Denies numbness, tingling or new weaknesses ?Behavioral/Psych: Mood is stable, no new changes  ?All other systems were reviewed with the patient and are negative. ? ?I have reviewed the past medical history, past surgical history, social history and family history with the patient and they are unchanged from previous note. ? ?ALLERGIES:  is allergic to imitrex [sumatriptan base] and cephalexin. ? ?MEDICATIONS:  ?Current Outpatient Medications  ?Medication Sig Dispense Refill  ? oxybutynin (DITROPAN) 5 MG tablet Take 1 tablet (5 mg total) by mouth every 8 (eight) hours as needed for bladder spasms. 30 tablet 1  ? ?No current facility-administered medications for this visit.  ? ?Facility-Administered Medications Ordered in Other Visits  ?Medication Dose Route Frequency Provider Last Rate Last Admin  ? acetaminophen (TYLENOL) tablet 650 mg  650 mg Oral Once Heath Lark, MD      ? ? ?SUMMARY OF ONCOLOGIC HISTORY: ?Oncology History  ?Cervical ca [facility])  ?09/27/2009 Initial Diagnosis  ? Cervical ca, IIB SCCA diagnosed in [facility] ?  ? - 01/27/2010 Radiation Therapy  ? Compelted chemorads and  brachy ?  ?11/18/2010 Surgery  ? EUA  and biopsies negative ?  ?08/05/2021 Imaging  ? 1. Bilateral inguinal lymphadenopathy. Associated multiple prominent retroperitoneal lymph nodes with an enlarged left periaortic lymph node measuring up to 1.4 cm. Markedly limited evaluation on this noncontrast study.  Findings concerning for a patient with history of malignancy. ?2. Bilateral ureteral stents in grossly appropriate position with associated right moderate and left severe hydronephrosis. ?3. Stool throughout the colon. ?4.  Aortic Atherosclerosis (ICD10-I70.0). ?  ?  ?08/12/2021 Cancer Staging  ? Staging form: Cervix Uteri, AJCC Version 9 ?- Clinical stage from 08/12/2021: Stage IVB (rcT3b, cN2a, cM1) - Signed by Heath Lark, MD on 08/12/2021 ?Stage prefix: Recurrence ? ?  ?09/07/2021 Procedure  ? Preoperative diagnosis:  ?1.  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  History of cervical cancer quiring chemotherapy and radiation ?  ?Postoperative diagnosis: ?1.  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  History of cervical cancer quiring chemotherapy and radiation ?  ?Procedure(s): ?1.  Cystoscopy with bilateral ureteral stent exchange ?2.  Bilateral retrograde pyelograms with intraoperative interpretation of fluoroscopic imaging ?  ?Surgeon: Ellison Hughs, MD ?Specimens: ?1.  Previously placed bilateral ureteral stents were removed intact, inspected and discarded ?  ?Drains/Catheters: ?1.  Bilateral 6 French, 24 cm JJ stents without tethers ?  ?Intraoperative findings:   ?Right retrograde pyelogram revealed a stenotic area in the distal aspects of the right ureter measuring approximately 4 to 5 cm with proximal dilation of the ureter, renal pelvis and its associated calyces.  No other intraluminal filling defects were identified. ?Left retrograde pyelogram revealed a stenotic area in the distal aspects of the left ureter measuring approximately 4 to 5 cm with proximal dilation of the ureter, renal pelvis and its associated calyces.  No other intraluminal filling defects were identified. ?  ?  ? ? ?PHYSICAL EXAMINATION: ?ECOG PERFORMANCE STATUS: 1 - Symptomatic but completely  ambulatory ? ?Vitals:  ? 10/25/21 1136  ?BP: (!) 142/82  ?Pulse: 83  ?Resp: 18  ?Temp: 98.2 ?F (36.8 ?C)  ?SpO2: 100%  ? ?Filed Weights  ? 10/25/21 1136  ?Weight: 188 lb 6.4 oz (85.5 kg)  ? ? ?GENERAL:alert, no distress and comfortable.  She looks pale ?NEURO: alert & oriented x 3 with fluent speech, no focal motor/sensory deficits ? ?LABORATORY DATA:  ?I have reviewed the data as listed ?   ?Component Value Date/Time  ? NA 135 08/22/2020 0304  ? K 5.0 08/22/2020 0304  ? CL 108 08/22/2020 0304  ? CO2 11 (L) 08/22/2020 0304  ? GLUCOSE 77 08/22/2020 0304  ? BUN 50 (H) 08/22/2020 0304  ? CREATININE 4.56 (H) 08/22/2020 0304  ? CALCIUM 8.2 (L) 08/22/2020 0304  ? PROT 7.5 08/15/2020 0300  ? ALBUMIN 2.2 (L) 08/16/2020 0328  ? AST 9 (L) 08/15/2020 0300  ? ALT 8 08/15/2020 0300  ? ALKPHOS 91 08/15/2020 0300  ? BILITOT 0.6 08/15/2020 0300  ? GFRNONAA 12 (L) 08/22/2020 0304  ? GFRAA >60 11/13/2017 1109  ? ? ?No results found for: SPEP, UPEP ? ?Lab Results  ?Component Value Date  ? WBC 8.5 10/25/2021  ? NEUTROABS 5.8 10/25/2021  ? HGB 8.4 (L) 10/25/2021  ? HCT 26.4 (L) 10/25/2021  ? MCV 93.3 10/25/2021  ? PLT 308 10/25/2021  ? ? ?  Chemistry   ?   ?Component Value Date/Time  ? NA 135 08/22/2020 0304  ? K 5.0 08/22/2020 0304  ?  CL 108 08/22/2020 0304  ? CO2 11 (L) 08/22/2020 0304  ? BUN 50 (H) 08/22/2020 0304  ? CREATININE 4.56 (H) 08/22/2020 0304  ?    ?Component Value Date/Time  ? CALCIUM 8.2 (L) 08/22/2020 0304  ? ALKPHOS 91 08/15/2020 0300  ? AST 9 (L) 08/15/2020 0300  ? ALT 8 08/15/2020 0300  ? BILITOT 0.6 08/15/2020 0300  ?  ? ? ?

## 2021-10-25 NOTE — Assessment & Plan Note (Signed)
She had remote history of cervical cancer, recently found to have abnormal imaging study, could be related to infection ?I recommend repeat CT imaging of the abdomen and pelvis for evaluation and she is in agreement ?

## 2021-10-25 NOTE — Assessment & Plan Note (Signed)
The patient have chronic kidney failure stage IV and is currently on close monitoring with nephrologist ?She is not symptomatic ?

## 2021-10-25 NOTE — Assessment & Plan Note (Signed)
She has multifactorial anemia, likely due to anemia chronic kidney failure ?I am surprised to see that her anemia improved without recent blood transfusion support ?I recommend we proceed with darbepoetin injection and she is in agreement ?I plan to recheck her blood count again in 2 weeks for further follow-up ?

## 2021-10-25 NOTE — Progress Notes (Signed)
Per Dr. Alvy Bimler, no need for blood transfusion today. Aranesp injection only. ?

## 2021-10-25 NOTE — Patient Instructions (Addendum)
Darbepoetin Alfa injection ?CT 4/6, arrive at 1030 to [facility] for 11 am appt. NPO 4 hours prior to CT. Drink the first bottle of contrast at 9 am and 2nd bottle at 10 am ?What is this medication? ?DARBEPOETIN ALFA (dar be POE e tin  AL fa) helps your body make more red blood cells. It is used to treat anemia caused by chronic kidney failure and chemotherapy. ?This medicine may be used for other purposes; ask your health care provider or pharmacist if you have questions. ?COMMON BRAND NAME(S): Aranesp ?What should I tell my care team before I take this medication? ?They need to know if you have any of these conditions: ?blood clotting disorders or history of blood clots ?cancer patient not on chemotherapy ?cystic fibrosis ?heart disease, such as angina, heart failure, or a history of a heart attack ?hemoglobin level of 12 g/dL or greater ?high blood pressure ?low levels of folate, iron, or vitamin B12 ?seizures ?an unusual or allergic reaction to darbepoetin, erythropoietin, albumin, hamster proteins, latex, other medicines, foods, dyes, or preservatives ?pregnant or trying to get pregnant ?breast-feeding ?How should I use this medication? ?This medicine is for injection into a vein or under the skin. It is usually given by a health care professional in a hospital or clinic setting. ?If you get this medicine at home, you will be taught how to prepare and give this medicine. Use exactly as directed. Take your medicine at regular intervals. Do not take your medicine more often than directed. ?It is important that you put your used needles and syringes in a special sharps container. Do not put them in a trash can. If you do not have a sharps container, call your pharmacist or healthcare provider to get one. ?A special MedGuide will be given to you by the pharmacist with each prescription and refill. Be sure to read this information carefully each time. ?Talk to your pediatrician regarding the use of this medicine in  children. While this medicine may be used in children as young as 15 month of age for selected conditions, precautions do apply. ?Overdosage: If you think you have taken too much of this medicine contact a poison control center or emergency room at once. ?NOTE: This medicine is only for you. Do not share this medicine with others. ?What if I miss a dose? ?If you miss a dose, take it as soon as you can. If it is almost time for your next dose, take only that dose. Do not take double or extra doses. ?What may interact with this medication? ?Do not take this medicine with any of the following medications: ?epoetin alfa ?This list may not describe all possible interactions. Give your health care provider a list of all the medicines, herbs, non-prescription drugs, or dietary supplements you use. Also tell them if you smoke, drink alcohol, or use illegal drugs. Some items may interact with your medicine. ?What should I watch for while using this medication? ?Your condition will be monitored carefully while you are receiving this medicine. ?You may need blood work done while you are taking this medicine. ?This medicine may cause a decrease in vitamin B6. You should make sure that you get enough vitamin B6 while you are taking this medicine. Discuss the foods you eat and the vitamins you take with your health care professional. ?What side effects may I notice from receiving this medication? ?Side effects that you should report to your doctor or health care professional as soon as possible: ?allergic reactions  like skin rash, itching or hives, swelling of the face, lips, or tongue ?breathing problems ?changes in vision ?chest pain ?confusion, trouble speaking or understanding ?feeling faint or lightheaded, falls ?high blood pressure ?muscle aches or pains ?pain, swelling, warmth in the leg ?rapid weight gain ?severe headaches ?sudden numbness or weakness of the face, arm or leg ?trouble walking, dizziness, loss of balance or  coordination ?seizures (convulsions) ?swelling of the ankles, feet, hands ?unusually weak or tired ?Side effects that usually do not require medical attention (report to your doctor or health care professional if they continue or are bothersome): ?diarrhea ?fever, chills (flu-like symptoms) ?headaches ?nausea, vomiting ?redness, stinging, or swelling at site where injected ?This list may not describe all possible side effects. Call your doctor for medical advice about side effects. You may report side effects to FDA at 1-800-FDA-1088. ?Where should I keep my medication? ?Keep out of the reach of children. ?Store in a refrigerator between 2 and 8 degrees C (36 and 46 degrees F). Do not freeze. Do not shake. Throw away any unused portion if using a single-dose vial. Throw away any unused medicine after the expiration date. ?NOTE: This sheet is a summary. It may not cover all possible information. If you have questions about this medicine, talk to your doctor, pharmacist, or health care provider. ?? 2022 Elsevier/Gold Standard (2017-08-06 00:00:00) ? ?

## 2021-11-02 ENCOUNTER — Encounter: Payer: Self-pay | Admitting: Hematology and Oncology

## 2021-11-03 ENCOUNTER — Ambulatory Visit (HOSPITAL_COMMUNITY): Payer: 59

## 2021-11-10 ENCOUNTER — Encounter (HOSPITAL_COMMUNITY): Payer: Self-pay

## 2021-11-10 ENCOUNTER — Ambulatory Visit (HOSPITAL_COMMUNITY): Admission: RE | Admit: 2021-11-10 | Payer: 59 | Source: Ambulatory Visit

## 2021-11-11 ENCOUNTER — Inpatient Hospital Stay: Payer: 59

## 2021-11-11 ENCOUNTER — Inpatient Hospital Stay: Payer: 59 | Admitting: Hematology and Oncology

## 2021-11-11 ENCOUNTER — Telehealth: Payer: Self-pay

## 2021-11-11 NOTE — Telephone Encounter (Signed)
Received message from lab that she showed up for appts and then left without being seen. Lab called her and she went home without being seen. ?

## 2021-11-25 NOTE — Progress Notes (Signed)
..  Patient is receiving Replacement Medication. ?Medication: Aranesp ?Manufacture: [employer] ?Approval Dates: Approved from 09/09/2021 until 09/09/2022. ?ID: [id number] ?Reason: Self Pay ?First DOS: 10/25/2021  ?

## 2022-01-09 ENCOUNTER — Other Ambulatory Visit: Payer: Self-pay | Admitting: Urology

## 2022-01-24 ENCOUNTER — Encounter (HOSPITAL_COMMUNITY)
Admission: RE | Admit: 2022-01-24 | Discharge: 2022-01-24 | Disposition: A | Discharge status: Home / Self Care | Payer: 59 | Source: Ambulatory Visit | Attending: Urology | Admitting: Urology

## 2022-01-24 ENCOUNTER — Other Ambulatory Visit: Payer: Self-pay

## 2022-01-24 ENCOUNTER — Encounter (HOSPITAL_COMMUNITY): Payer: Self-pay

## 2022-01-24 DIAGNOSIS — Z01812 Encounter for preprocedural laboratory examination: Secondary | ICD-10-CM | POA: Diagnosis present

## 2022-01-24 DIAGNOSIS — Z01818 Encounter for other preprocedural examination: Secondary | ICD-10-CM

## 2022-01-25 ENCOUNTER — Encounter (HOSPITAL_COMMUNITY)
Admission: RE | Admit: 2022-01-25 | Discharge: 2022-01-25 | Disposition: A | Discharge status: Home / Self Care | Payer: 59 | Source: Ambulatory Visit | Attending: Urology | Admitting: Urology

## 2022-01-25 DIAGNOSIS — Z01818 Encounter for other preprocedural examination: Secondary | ICD-10-CM

## 2022-01-25 DIAGNOSIS — Z01812 Encounter for preprocedural laboratory examination: Secondary | ICD-10-CM | POA: Insufficient documentation

## 2022-01-25 LAB — CBC
HCT: 24.5 % — ABNORMAL LOW (ref 36.0–46.0)
Hemoglobin: 7.5 g/dL — ABNORMAL LOW (ref 12.0–15.0)
MCH: 28.1 pg (ref 26.0–34.0)
MCHC: 30.6 g/dL (ref 30.0–36.0)
MCV: 91.8 fL (ref 80.0–100.0)
Platelets: 331 10*3/uL (ref 150–400)
RBC: 2.67 MIL/uL — ABNORMAL LOW (ref 3.87–5.11)
RDW: 14.7 % (ref 11.5–15.5)
WBC: 7.4 10*3/uL (ref 4.0–10.5)
nRBC: 0 % (ref 0.0–0.2)

## 2022-01-25 NOTE — Progress Notes (Signed)
Lab. Results: Hemoglobin: 7.5.

## 2022-01-30 ENCOUNTER — Encounter (HOSPITAL_COMMUNITY): Payer: Self-pay | Admitting: Certified Registered Nurse Anesthetist

## 2022-02-01 ENCOUNTER — Ambulatory Visit (HOSPITAL_COMMUNITY): Admission: RE | Admit: 2022-02-01 | Payer: 59 | Source: Home / Self Care | Admitting: Urology

## 2022-02-01 ENCOUNTER — Encounter (HOSPITAL_COMMUNITY): Admission: RE | Payer: Self-pay | Source: Home / Self Care

## 2022-02-01 SURGERY — CYSTOURETEROSCOPY, WITH STENT INSERTION
Anesthesia: Choice | Laterality: Bilateral

## 2022-02-01 MED ORDER — ONDANSETRON HCL 4 MG/2ML IJ SOLN
INTRAMUSCULAR | Status: AC
  Filled 2022-02-01: qty 2

## 2022-02-01 MED ORDER — MIDAZOLAM HCL 2 MG/2ML IJ SOLN
INTRAMUSCULAR | Status: AC
  Filled 2022-02-01: qty 2

## 2022-02-01 MED ORDER — DEXAMETHASONE SODIUM PHOSPHATE 10 MG/ML IJ SOLN
INTRAMUSCULAR | Status: AC
  Filled 2022-02-01: qty 1

## 2022-02-01 MED ORDER — PROPOFOL 10 MG/ML IV BOLUS
INTRAVENOUS | Status: AC
  Filled 2022-02-01: qty 20

## 2022-02-01 MED ORDER — LIDOCAINE HCL (PF) 2 % IJ SOLN
INTRAMUSCULAR | Status: AC
  Filled 2022-02-01: qty 5

## 2022-02-01 MED ORDER — FENTANYL CITRATE (PF) 100 MCG/2ML IJ SOLN
INTRAMUSCULAR | Status: AC
  Filled 2022-02-01: qty 2

## 2022-02-01 NOTE — Progress Notes (Signed)
Patients surgery was cancelled today by Dr Lovena Neighbours.  Patient stated she thought her surgery was scheduled for tomorrow 02/02/22.  Patient told us at 0645 that it would take her about 35 minutes arrive.   0715 patient was called to verify that she had been NPO since [place], patient stated that she had not left yet for the hospital.  Patient was instructed that she needed to leave to arrive at the hospital as soon as she could.   At [phone] patient called short stay to tell us that she was on her way.  At this time patient was notified that her procedure has been cancelled by Dr winter.

## 2022-02-21 ENCOUNTER — Ambulatory Visit (HOSPITAL_BASED_OUTPATIENT_CLINIC_OR_DEPARTMENT_OTHER): Payer: 59 | Admitting: Advanced Practice Midwife

## 2022-04-07 ENCOUNTER — Other Ambulatory Visit: Payer: Self-pay | Admitting: Urology

## 2022-04-14 NOTE — Patient Instructions (Signed)
DUE TO COVID-19 ONLY TWO VISITORS  (aged 42 and older)  ARE ALLOWED TO COME WITH YOU AND STAY IN THE WAITING ROOM ONLY DURING PRE OP AND PROCEDURE.   **NO VISITORS ARE ALLOWED IN THE SHORT STAY AREA OR RECOVERY ROOM!!**  IF YOU WILL BE ADMITTED INTO THE HOSPITAL YOU ARE ALLOWED ONLY FOUR SUPPORT PEOPLE DURING VISITATION HOURS ONLY (7 AM -8PM)   The support person(s) must pass our screening, gel in and out, and wear a mask at all times, including in the patient's room. Patients must also wear a mask when staff or their support person are in the room. Visitors GUEST BADGE MUST BE WORN VISIBLY  One adult visitor may remain with you overnight and MUST be in the room by 8 P.M.     Your procedure is scheduled on: 04/19/22   Report to [facility] Main Entrance    Report to admitting at  9:30 AM   Call this number if you have problems the morning of surgery [phone]   Do not eat food or drink:After Midnight.                If you have questions, please contact your surgeon's office.   FOLLOW BOWEL PREP AND ANY ADDITIONAL PRE OP INSTRUCTIONS YOU RECEIVED FROM YOUR SURGEON'S OFFICE!!!     Oral Hygiene is also important to reduce your risk of infection.                                    Remember - BRUSH YOUR TEETH THE MORNING OF SURGERY WITH YOUR REGULAR TOOTHPASTE   Do NOT smoke after Midnight   Take these medicines the morning of surgery with A SIP OF WATER: Oxybutynin   Bring CPAP mask and tubing day of surgery.                              You may not have any metal on your body including hair pins, jewelry, and body piercing             Do not wear make-up, lotions, powders, perfumes/cologne, or deodorant  Do not wear nail polish including gel and S&S, artificial/acrylic nails, or any other type of covering on natural nails including finger and toenails. If you have artificial nails, gel coating, etc. that needs to be removed by a nail salon please have this removed  prior to surgery or surgery may need to be canceled/ delayed if the surgeon/ anesthesia feels like they are unable to be safely monitored.   Do not shave  48 hours prior to surgery.     Do not bring valuables to the hospital. [place].   Contacts, dentures or bridgework may not be worn into surgery.   DO NOT [place].    Patients discharged on the day of surgery will not be allowed to drive home.  Someone NEEDS to stay with you for the first 24 hours after anesthesia.   Special Instructions: Bring a copy of your healthcare power of attorney and living will documents the day of surgery if you haven't scanned them before.              Please read over the following  fact sheets you were given: IF YOU HAVE QUESTIONS ABOUT YOUR PRE-OP INSTRUCTIONS PLEASE CALL [phone]     [facility] - Preparing for Surgery Before surgery, you can play an important role.  Because skin is not sterile, your skin needs to be as free of germs as possible.  You can reduce the number of germs on your skin by washing with CHG (chlorahexidine gluconate) soap before surgery.  CHG is an antiseptic cleaner which kills germs and bonds with the skin to continue killing germs even after washing. Please DO NOT use if you have an allergy to CHG or antibacterial soaps.  If your skin becomes reddened/irritated stop using the CHG and inform your nurse when you arrive at Short Stay. Do not shave (including legs and underarms) for at least 48 hours prior to the first CHG shower.  Please follow these instructions carefully:  1.  Shower with CHG Soap the night before surgery and the  morning of Surgery.  2.  If you choose to wash your hair, wash your hair first as usual with your  normal  shampoo.  3.  After you shampoo, rinse your hair and body thoroughly to remove the  shampoo.                            4.  Use CHG as you would any other  liquid soap.  You can apply chg directly  to the skin and wash                       Gently with a scrungie or clean washcloth.  5.  Apply the CHG Soap to your body ONLY FROM THE NECK DOWN.   Do not use on face/ open                           Wound or open sores. Avoid contact with eyes, ears mouth and genitals (private parts).                       Wash face,  Genitals (private parts) with your normal soap.             6.  Wash thoroughly, paying special attention to the area where your surgery  will be performed.  7.  Thoroughly rinse your body with warm water from the neck down.  8.  DO NOT shower/wash with your normal soap after using and rinsing off  the CHG Soap.             9.  Pat yourself dry with a clean towel.            10.  Wear clean pajamas.            11.  Place clean sheets on your bed the night of your first shower and do not  sleep with pets. Day of Surgery : Do not apply any lotions/deodorants the morning of surgery.  Please wear clean clothes to the hospital/surgery center.  FAILURE TO FOLLOW THESE INSTRUCTIONS MAY RESULT IN THE CANCELLATION OF YOUR SURGERY     ________________________________________________________________________

## 2022-04-17 ENCOUNTER — Encounter (HOSPITAL_COMMUNITY): Payer: Self-pay

## 2022-04-17 ENCOUNTER — Encounter (HOSPITAL_COMMUNITY)
Admission: RE | Admit: 2022-04-17 | Discharge: 2022-04-17 | Disposition: A | Discharge status: Home / Self Care | Payer: Commercial Managed Care - HMO | Source: Ambulatory Visit | Attending: Urology | Admitting: Urology

## 2022-04-17 ENCOUNTER — Other Ambulatory Visit: Payer: Self-pay

## 2022-04-17 VITALS — BP 159/97 | HR 94 | Temp 98.5°F | Resp 18 | Ht 65.0 in | Wt 200.0 lb

## 2022-04-17 DIAGNOSIS — Z01818 Encounter for other preprocedural examination: Secondary | ICD-10-CM

## 2022-04-17 DIAGNOSIS — F1910 Other psychoactive substance abuse, uncomplicated: Secondary | ICD-10-CM | POA: Insufficient documentation

## 2022-04-17 DIAGNOSIS — D649 Anemia, unspecified: Secondary | ICD-10-CM | POA: Insufficient documentation

## 2022-04-17 LAB — CBC
HCT: 26.9 % — ABNORMAL LOW (ref 36.0–46.0)
Hemoglobin: 8.1 g/dL — ABNORMAL LOW (ref 12.0–15.0)
MCH: 28.6 pg (ref 26.0–34.0)
MCHC: 30.1 g/dL (ref 30.0–36.0)
MCV: 95.1 fL (ref 80.0–100.0)
Platelets: 274 10*3/uL (ref 150–400)
RBC: 2.83 MIL/uL — ABNORMAL LOW (ref 3.87–5.11)
RDW: 14.6 % (ref 11.5–15.5)
WBC: 7.8 10*3/uL (ref 4.0–10.5)
nRBC: 0 % (ref 0.0–0.2)

## 2022-04-17 LAB — COMPREHENSIVE METABOLIC PANEL
ALT: 25 U/L (ref 0–44)
AST: 27 U/L (ref 15–41)
Albumin: 2.9 g/dL — ABNORMAL LOW (ref 3.5–5.0)
Alkaline Phosphatase: 78 U/L (ref 38–126)
Anion gap: 8 (ref 5–15)
BUN: 42 mg/dL — ABNORMAL HIGH (ref 6–20)
CO2: 23 mmol/L (ref 22–32)
Calcium: 8.6 mg/dL — ABNORMAL LOW (ref 8.9–10.3)
Chloride: 105 mmol/L (ref 98–111)
Creatinine, Ser: 4.08 mg/dL — ABNORMAL HIGH (ref 0.44–1.00)
GFR, Estimated: 13 mL/min — ABNORMAL LOW (ref >60)
Glucose, Bld: 107 mg/dL — ABNORMAL HIGH (ref 70–99)
Potassium: 4 mmol/L (ref 3.5–5.1)
Sodium: 136 mmol/L (ref 135–145)
Total Bilirubin: 0.2 mg/dL — ABNORMAL LOW (ref 0.3–1.2)
Total Protein: 9 g/dL — ABNORMAL HIGH (ref 6.5–8.1)

## 2022-04-17 NOTE — Progress Notes (Addendum)
Pt didn't show up for her PAT appointment at 2 PM today. I called all of her numbers and left a message. She did call back and said she was on her way but didn't see why she needed an appointment because it was "just labs". At 3:20 I called her again and was disconnected.  I called Conie at [facility] urology to let her know. Pt was still a no show at 10 minutes to 4 Pt arrived 10 min after 4 pm.  PAT was completed and instuctions and soap given.  Anesthesia note:  Bowel prep reminder:NA  PCP - none Cardiologist -none Other- Oncologist- Dr. Alvy Bimler  Chest x-ray - no EKG - 04/17/22 chart Stress Test - no ECHO - no Cardiac Cath - no  Pacemaker/ICD device last checked:NA  Sleep Study - no CPAP -   Pt is pre diabetic-NA Fasting Blood Sugar -  Checks Blood Sugar _____  Blood Thinner:NA Blood Thinner Instructions: Aspirin Instructions: Last Dose:  Anesthesia review: no  Patient denies shortness of breath, fever, cough and chest pain at PAT appointment Pt reports no SOB.  Patient verbalized understanding of instructions that were given to them at the PAT appointment. Patient was also instructed that they will need to review over the PAT instructions again at home before surgery. Yes.

## 2022-04-19 ENCOUNTER — Encounter (HOSPITAL_COMMUNITY)
Admission: RE | Disposition: A | Discharge status: Home / Self Care | Payer: Self-pay | Source: Home / Self Care | Attending: Urology

## 2022-04-19 ENCOUNTER — Ambulatory Visit (HOSPITAL_COMMUNITY)
Admission: RE | Admit: 2022-04-19 | Discharge: 2022-04-19 | Disposition: A | Discharge status: Home / Self Care | Payer: Commercial Managed Care - HMO | Attending: Urology | Admitting: Urology

## 2022-04-19 ENCOUNTER — Ambulatory Visit (HOSPITAL_COMMUNITY): Payer: Commercial Managed Care - HMO | Admitting: Physician Assistant

## 2022-04-19 ENCOUNTER — Ambulatory Visit (HOSPITAL_COMMUNITY): Payer: Commercial Managed Care - HMO

## 2022-04-19 ENCOUNTER — Encounter (HOSPITAL_COMMUNITY): Payer: Self-pay | Admitting: Urology

## 2022-04-19 ENCOUNTER — Ambulatory Visit (HOSPITAL_BASED_OUTPATIENT_CLINIC_OR_DEPARTMENT_OTHER): Payer: Commercial Managed Care - HMO | Admitting: Anesthesiology

## 2022-04-19 DIAGNOSIS — D649 Anemia, unspecified: Secondary | ICD-10-CM

## 2022-04-19 DIAGNOSIS — Z9221 Personal history of antineoplastic chemotherapy: Secondary | ICD-10-CM | POA: Diagnosis not present

## 2022-04-19 DIAGNOSIS — Z8541 Personal history of malignant neoplasm of cervix uteri: Secondary | ICD-10-CM | POA: Diagnosis not present

## 2022-04-19 DIAGNOSIS — N131 Hydronephrosis with ureteral stricture, not elsewhere classified: Secondary | ICD-10-CM | POA: Diagnosis not present

## 2022-04-19 DIAGNOSIS — F1910 Other psychoactive substance abuse, uncomplicated: Secondary | ICD-10-CM

## 2022-04-19 DIAGNOSIS — N185 Chronic kidney disease, stage 5: Secondary | ICD-10-CM | POA: Diagnosis not present

## 2022-04-19 DIAGNOSIS — N132 Hydronephrosis with renal and ureteral calculous obstruction: Secondary | ICD-10-CM

## 2022-04-19 DIAGNOSIS — Z923 Personal history of irradiation: Secondary | ICD-10-CM | POA: Diagnosis not present

## 2022-04-19 DIAGNOSIS — F17200 Nicotine dependence, unspecified, uncomplicated: Secondary | ICD-10-CM | POA: Insufficient documentation

## 2022-04-19 DIAGNOSIS — Z466 Encounter for fitting and adjustment of urinary device: Secondary | ICD-10-CM | POA: Insufficient documentation

## 2022-04-19 DIAGNOSIS — N3000 Acute cystitis without hematuria: Secondary | ICD-10-CM | POA: Diagnosis not present

## 2022-04-19 HISTORY — PX: CYSTOSCOPY W/ URETERAL STENT PLACEMENT: SHX1429

## 2022-04-19 SURGERY — CYSTOSCOPY, FLEXIBLE, WITH STENT REPLACEMENT
Anesthesia: General | Laterality: Bilateral

## 2022-04-19 MED ORDER — PROPOFOL 10 MG/ML IV BOLUS
INTRAVENOUS | Status: DC | PRN
  Administered 2022-04-19: 150 mg via INTRAVENOUS

## 2022-04-19 MED ORDER — FENTANYL CITRATE (PF) 100 MCG/2ML IJ SOLN
INTRAMUSCULAR | Status: AC
  Filled 2022-04-19: qty 2

## 2022-04-19 MED ORDER — IOHEXOL 300 MG/ML  SOLN
INTRAMUSCULAR | Status: DC | PRN
  Administered 2022-04-19: 4 mL

## 2022-04-19 MED ORDER — MIDAZOLAM HCL 2 MG/2ML IJ SOLN
INTRAMUSCULAR | Status: DC | PRN
  Administered 2022-04-19: 2 mg via INTRAVENOUS

## 2022-04-19 MED ORDER — MIDAZOLAM HCL 2 MG/2ML IJ SOLN
INTRAMUSCULAR | Status: AC
  Filled 2022-04-19: qty 2

## 2022-04-19 MED ORDER — PROMETHAZINE HCL 25 MG/ML IJ SOLN: 6.2500 mg | INTRAMUSCULAR | Status: DC | PRN

## 2022-04-19 MED ORDER — ORAL CARE MOUTH RINSE: 15.0000 mL | Freq: Once | OROMUCOSAL | Status: AC

## 2022-04-19 MED ORDER — DEXAMETHASONE SODIUM PHOSPHATE 4 MG/ML IJ SOLN
INTRAMUSCULAR | Status: DC | PRN
  Administered 2022-04-19: 5 mg via INTRAVENOUS

## 2022-04-19 MED ORDER — LACTATED RINGERS IV SOLN: INTRAVENOUS | Status: DC

## 2022-04-19 MED ORDER — LIDOCAINE HCL (PF) 2 % IJ SOLN
INTRAMUSCULAR | Status: AC
  Filled 2022-04-19: qty 5

## 2022-04-19 MED ORDER — SODIUM CHLORIDE 0.9 % IV SOLN
1.0000 g | Freq: Once | INTRAVENOUS | Status: AC
  Administered 2022-04-19: 1 g via INTRAVENOUS
  Filled 2022-04-19: qty 10

## 2022-04-19 MED ORDER — ONDANSETRON HCL 4 MG/2ML IJ SOLN
INTRAMUSCULAR | Status: DC | PRN
  Administered 2022-04-19: 4 mg via INTRAVENOUS

## 2022-04-19 MED ORDER — ACETAMINOPHEN 500 MG PO TABS
1000.0000 mg | ORAL_TABLET | Freq: Once | ORAL | Status: AC
  Administered 2022-04-19: 1000 mg via ORAL
  Filled 2022-04-19: qty 2

## 2022-04-19 MED ORDER — PROPOFOL 10 MG/ML IV BOLUS
INTRAVENOUS | Status: AC
  Filled 2022-04-19: qty 20

## 2022-04-19 MED ORDER — FENTANYL CITRATE (PF) 100 MCG/2ML IJ SOLN
INTRAMUSCULAR | Status: DC | PRN
  Administered 2022-04-19 (×2): 25 ug via INTRAVENOUS
  Administered 2022-04-19: 50 ug via INTRAVENOUS

## 2022-04-19 MED ORDER — FENTANYL CITRATE PF 50 MCG/ML IJ SOSY: 25.0000 ug | PREFILLED_SYRINGE | INTRAMUSCULAR | Status: DC | PRN

## 2022-04-19 MED ORDER — KETOROLAC TROMETHAMINE 30 MG/ML IJ SOLN
INTRAMUSCULAR | Status: AC
  Filled 2022-04-19: qty 1

## 2022-04-19 MED ORDER — DEXAMETHASONE SODIUM PHOSPHATE 10 MG/ML IJ SOLN
INTRAMUSCULAR | Status: AC
  Filled 2022-04-19: qty 1

## 2022-04-19 MED ORDER — CHLORHEXIDINE GLUCONATE 0.12 % MT SOLN
15.0000 mL | Freq: Once | OROMUCOSAL | Status: AC
  Administered 2022-04-19: 15 mL via OROMUCOSAL

## 2022-04-19 MED ORDER — LIDOCAINE 2% (20 MG/ML) 5 ML SYRINGE
INTRAMUSCULAR | Status: DC | PRN
  Administered 2022-04-19: 100 mg via INTRAVENOUS

## 2022-04-19 MED ORDER — KETOROLAC TROMETHAMINE 30 MG/ML IJ SOLN
INTRAMUSCULAR | Status: DC | PRN
  Administered 2022-04-19: 30 mg via INTRAVENOUS

## 2022-04-19 MED ORDER — ONDANSETRON HCL 4 MG/2ML IJ SOLN
INTRAMUSCULAR | Status: AC
  Filled 2022-04-19: qty 2

## 2022-04-19 MED ORDER — TRAMADOL HCL 50 MG PO TABS: 50.0000 mg | ORAL_TABLET | Freq: Four times a day (QID) | ORAL | 0 refills | Status: AC | PRN

## 2022-04-19 SURGICAL SUPPLY — 11 items
BAG URO CATCHER STRL LF (MISCELLANEOUS) ×1 IMPLANT
CATH URETL OPEN 5X70 (CATHETERS) ×1 IMPLANT
CLOTH BEACON ORANGE TIMEOUT ST (SAFETY) ×1 IMPLANT
GLOVE SURG LX STRL 7.5 STRW (GLOVE) ×1 IMPLANT
GOWN STRL REUS W/ TWL XL LVL3 (GOWN DISPOSABLE) ×1 IMPLANT
GOWN STRL REUS W/TWL XL LVL3 (GOWN DISPOSABLE) ×1
GUIDEWIRE ZIPWRE .038 STRAIGHT (WIRE) ×1 IMPLANT
MANIFOLD NEPTUNE II (INSTRUMENTS) ×1 IMPLANT
PACK CYSTO (CUSTOM PROCEDURE TRAY) ×1 IMPLANT
STENT POLARIS LOOP 6FR X 24 CM (STENTS) IMPLANT
TUBING CONNECTING 10 (TUBING) ×1 IMPLANT

## 2022-04-19 NOTE — H&P (Signed)
PRE-OP H&P  Office Visit Report     04/11/2022   --------------------------------------------------------------------------------   Marie Brewer  MRN: [id number]  DOB: Oct 08, 1980, 41 year old Female  PROVIDER:  Ellison Hughs, M.D.  TREATING:  Daine Gravel, NP  LOCATION:  [facility] [phone]     --------------------------------------------------------------------------------   CC/HPI: CC: Follow up for BL ureteral obstruction managed with indwelling stents   Initial visit 07/20/21 (Dr. Lovena Neighbours):  Marie Brewer is a 41 year old female who was initially seen in consultation on 08/15/2020 due to bilateral hydronephrosis thought to be secondary to distal ureteral obstruction from previous chemoradiation for treatment of cervical cancer. She was also noted to have an esophageal perforation at that time. She was scheduled for follow-up on 08/31/2020, but failed to do so. She is presents today with retained stents for almost 12 months.  Today, she reports intermittent episodes of gross hematuria for the past several weeks. She also notes seeing small amounts of stone debris intermittently pass in her urine.  BMP from 08/22/2020 revealed a serum creatinine of 4.56 with an estimated GFR of 12 despite bilateral ureteral stents being in place.   Stent exchange 09/07/21 (Dr. Lovena Neighbours):  Successful BL stent exchange with 6Fr x 24cm JJ stents. BL distal strictures ~4-5 cm long with proximal hydronephrosis   Interval 01/05/22:  Since stent exchange patient has felt much improved. She no longer has gross hematuria and her flank pain is resolved. She has recently developed urinary urgency and frequency and some pain after voiding. She is unsure if this is related to a UTI or normal stent discomfort. She has oxybutynin at home but has never tried to take it. She denies fevers or chills. Feels to be making adequate amounts of urine.   04/11/2022: 41 year old female with the above-noted past  medical history. She is scheduled to undergo stent exchange on 04/19/2022. She presents today for preoperative appointment. She is tolerating her stents well. She denies changes to her medical history. She denies new allergies.     ALLERGIES: None   MEDICATIONS: Iron  Multivitamin     GU PSH: Cystoscopy Insert Stent - 09/07/2021, Bilateral - 2022     NON-GU PSH: No Non-GU PSH    GU PMH: Chronic kidney disease stage 5 (GFR<30) - 01/05/2022, - 07/20/2021 Hydronephrosis - 01/05/2022, - 07/20/2021 Gross hematuria - 07/20/2021    NON-GU PMH: No Non-GU PMH    FAMILY HISTORY: No Family History    SOCIAL HISTORY: No Social History    REVIEW OF SYSTEMS:    GU Review Female:   Patient reports frequent urination and hard to postpone urination. Patient denies burning /pain with urination, get up at night to urinate, leakage of urine, stream starts and stops, trouble starting your stream, have to strain to urinate, and being pregnant.  Gastrointestinal (Upper):   Patient denies nausea, vomiting, and indigestion/ heartburn.  Gastrointestinal (Lower):   Patient denies diarrhea and constipation.  Constitutional:   Patient denies fever, night sweats, fatigue, and weight loss.  Skin:   Patient denies skin rash/ lesion and itching.  Eyes:   Patient denies blurred vision and double vision.  Hematologic/Lymphatic:   Patient denies swollen glands and easy bruising.  Cardiovascular:   Patient denies leg swelling and chest pains.  Respiratory:   Patient denies cough and shortness of breath.  Musculoskeletal:   Patient denies back pain and joint pain.  Neurological:   Patient denies headaches and dizziness.  Psychologic:   Patient denies depression and anxiety.  VITAL SIGNS:      04/11/2022 02:48 PM  Weight 160 lb / 72.57 kg  Height 67 in / 170.18 cm  BP 144/71 mmHg  Pulse 85 /min  Temperature 98.2 F / 36.7 C  BMI 25.1 kg/m   MULTI-SYSTEM PHYSICAL EXAMINATION:    Constitutional: Obese. Appears  older than their stated age. Poor grooming. No physical deformities.   Respiratory: No labored breathing, no use of accessory muscles.   Cardiovascular: Regular rate and rhythm. No murmur, no gallop. Normal temperature, normal extremity pulses, no swelling, no varicosities.   Skin: No paleness, no jaundice, no cyanosis. No lesion, no ulcer, no rash.   Neurologic / Psychiatric: Oriented to time, oriented to place, oriented to person. No depression, no anxiety, no agitation.  Gastrointestinal: No mass, no tenderness, no rigidity, non obese abdomen.     Complexity of Data:  Source Of History:  Patient  Records Review:   Previous Doctor Records, Previous Patient Records  Urine Test Review:   Urinalysis   04/11/22  Urinalysis  Urine Appearance Clear   Urine Color Yellow   Urine Glucose Neg mg/dL  Urine Bilirubin Neg mg/dL  Urine Ketones Neg mg/dL  Urine Specific Gravity 1.020   Urine Blood 3+ ery/uL  Urine pH 6.5   Urine Protein 1+ mg/dL  Urine Urobilinogen 0.2 mg/dL  Urine Nitrites Positive   Urine Leukocyte Esterase 3+ leu/uL  Urine WBC/hpf >60/hpf   Urine RBC/hpf 3 - 10/hpf   Urine Epithelial Cells 0 - 5/hpf   Urine Bacteria Many (>50/hpf)   Urine Mucous Not Present   Urine Yeast NS (Not Seen)   Urine Trichomonas Not Present   Urine Cystals NS (Not Seen)   Urine Casts NS (Not Seen)   Urine Sperm Not Present    PROCEDURES:          Urinalysis w/Scope Dipstick Dipstick Cont'd Micro  Color: Yellow Bilirubin: Neg mg/dL WBC/hpf: >60/hpf  Appearance: Clear Ketones: Neg mg/dL RBC/hpf: 3 - 10/hpf  Specific Gravity: 1.020 Blood: 3+ ery/uL Bacteria: Many (>50/hpf)  pH: 6.5 Protein: 1+ mg/dL Cystals: NS (Not Seen)  Glucose: Neg mg/dL Urobilinogen: 0.2 mg/dL Casts: NS (Not Seen)    Nitrites: Positive Trichomonas: Not Present    Leukocyte Esterase: 3+ leu/uL Mucous: Not Present      Epithelial Cells: 0 - 5/hpf      Yeast: NS (Not Seen)      Sperm: Not Present    ASSESSMENT:       ICD-10 Details  1 GU:   Hydronephrosis - N13.0 Chronic, Stable  2   Chronic kidney disease stage 5 (GFR<30) - N18.5 Chronic, Stable  3   Acute Cystitis/UTI - Y78.29 Acute, Uncomplicated   PLAN:           Orders Labs CULTURE, URINE          Document Letter(s):  Created for Patient: Clinical Summary         Notes:   Urinalysis with infectious parameters today. I will send for culture and plan to treat according to results. She will keep her surgery as scheduled for bilateral stent exchange. She understands the risks of the procedure and all of her questions were answered to the best of my ability. She understands that if she develops fevers or chills she should notify our office prior to undergoing surgery. She may keep her appointment as scheduled on 04/19/2022.    -The risks, benefits and alternatives of cystoscopy with BILATERAL JJ stent placement was discussed  with the patient.  Risks include, but are not limited to: bleeding, urinary tract infection, ureteral injury, ureteral stricture disease, chronic pain, urinary symptoms, bladder injury, stent migration, the need for nephrostomy tube placement, MI, CVA, DVT, PE and the inherent risks with general anesthesia.  The patient voices understanding and wishes to proceed.

## 2022-04-19 NOTE — Anesthesia Procedure Notes (Signed)
Procedure Name: LMA Insertion Date/Time: 04/19/2022 11:30 AM  Performed by: Claudia Desanctis, CRNAPre-anesthesia Checklist: Emergency Drugs available, Patient identified, Suction available and Patient being monitored Patient Re-evaluated:Patient Re-evaluated prior to induction Oxygen Delivery Method: Circle system utilized Preoxygenation: Pre-oxygenation with 100% oxygen Induction Type: IV induction Ventilation: Mask ventilation without difficulty LMA: LMA inserted LMA Size: 4.0 Number of attempts: 1 Placement Confirmation: positive ETCO2 and breath sounds checked- equal and bilateral Tube secured with: Tape Dental Injury: Teeth and Oropharynx as per pre-operative assessment

## 2022-04-19 NOTE — Transfer of Care (Signed)
Immediate Anesthesia Transfer of Care Note  Patient: Marie Brewer  Procedure(s) Performed: CYSTOSCOPY WITH STENT EXCHANGE (Bilateral)  Patient Location: PACU  Anesthesia Type:General  Level of Consciousness: unresponsive and drowsy  Airway & Oxygen Therapy: Patient Spontanous Breathing and Patient connected to face mask  Post-op Assessment: Report given to RN and Post -op Vital signs reviewed and stable  Post vital signs: Reviewed and stable  Last Vitals:  Vitals Value Taken Time  BP 97/49 04/19/22 1207  Temp    Pulse 64 04/19/22 1208  Resp 12 04/19/22 1208  SpO2 100 % 04/19/22 1208  Vitals shown include unvalidated device data.  Last Pain:  Vitals:   04/19/22 1024  TempSrc:   PainSc: 6       Patients Stated Pain Goal: 4 (77/41/28 7867)  Complications: No notable events documented.

## 2022-04-19 NOTE — Anesthesia Preprocedure Evaluation (Addendum)
Anesthesia Evaluation  Patient identified by MRN, date of birth, ID band Patient awake    Reviewed: Allergy & Precautions, NPO status , Patient's Chart, lab work & pertinent test results  Airway Mallampati: II  TM Distance: >3 FB Neck ROM: Full    Dental  (+) Dental Advisory Given, Edentulous Lower, Edentulous Upper   Pulmonary Current Smoker and Patient abstained from smoking.,    Pulmonary exam normal breath sounds clear to auscultation       Cardiovascular negative cardio ROS Normal cardiovascular exam Rhythm:Regular Rate:Normal     Neuro/Psych  Headaches, negative psych ROS   GI/Hepatic negative GI ROS, (+)     substance abuse  IV drug use,   Endo/Other  Obesity   Renal/GU Renal InsufficiencyRenal disease   Bilateral ureteral obstruction     Musculoskeletal negative musculoskeletal ROS (+) narcotic dependent  Abdominal   Peds  Hematology  (+) Blood dyscrasia, anemia ,   Anesthesia Other Findings Day of surgery medications reviewed with the patient.  Reproductive/Obstetrics H/o cervical cancer                             Anesthesia Physical Anesthesia Plan  ASA: 2  Anesthesia Plan: General   Post-op Pain Management: Toradol IV (intra-op)* and Tylenol PO (pre-op)*   Induction: Intravenous  PONV Risk Score and Plan: 2 and Midazolam, Dexamethasone and Ondansetron  Airway Management Planned: LMA  Additional Equipment:   Intra-op Plan:   Post-operative Plan: Extubation in OR  Informed Consent: I have reviewed the patients History and Physical, chart, labs and discussed the procedure including the risks, benefits and alternatives for the proposed anesthesia with the patient or authorized representative who has indicated his/her understanding and acceptance.     Dental advisory given  Plan Discussed with: CRNA  Anesthesia Plan Comments:         Anesthesia Quick  Evaluation

## 2022-04-19 NOTE — Anesthesia Postprocedure Evaluation (Signed)
Anesthesia Post Note  Patient: Marie Brewer  Procedure(s) Performed: CYSTOSCOPY WITH STENT EXCHANGE (Bilateral)     Patient location during evaluation: PACU Anesthesia Type: General Level of consciousness: sedated and patient cooperative Pain management: pain level controlled Vital Signs Assessment: post-procedure vital signs reviewed and stable Respiratory status: spontaneous breathing Cardiovascular status: stable Anesthetic complications: no   No notable events documented.  Last Vitals:  Vitals:   04/19/22 1325 04/19/22 1341  BP:  119/64  Pulse: 64   Resp: 16   Temp:    SpO2: 97% 94%    Last Pain:  Vitals:   04/19/22 1341  TempSrc:   PainSc: 0-No pain                 Nolon Nations

## 2022-04-19 NOTE — Op Note (Signed)
Operative Note  Preoperative diagnosis:  1.  1.  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  History of cervical cancer quiring chemotherapy and radiation  Postoperative diagnosis: Same  Procedure(s): 1.  Cystoscopy with bilateral ureteral stent exchange 2.  Bilateral retrograde pyelograms with intraoperative interpretation fluoroscopic imaging  Surgeon: Ellison Hughs, MD  Assistants:  None  Anesthesia:  General  Complications:  None  EBL: Less than 5 mL  Specimens: 1.  Used to place bilateral ureteral stents removed intact, inspected and discarded  Drains/Catheters: 1.  Bilateral 6 French, 24 cm JJ stents without tethers  Intraoperative findings:   Left retrograde pyelogram revealed a stenotic area in the distal aspects of the left ureter measuring approximately 4 to 5 cm with proximal dilation of the ureter, renal pelvis and its associated calyces.  No other intraluminal filling defects were identified. Right retrograde pyelogram revealed a stenotic area in the distal aspects of the right ureter measuring approximately 4 to 5 cm with proximal dilation of the ureter, renal pelvis and its associated calyces.  No other intraluminal filling defects were identified.  Indication:  Marie Brewer is a 41 y.o. 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  She has been consented for the above procedures, voices understanding and wishes to proceed.  Description of procedure:  After informed consent was obtained, the patient was brought to the operating room and general LMA anesthesia was administered. The patient was then placed in the dorsolithotomy position and prepped and draped in the usual sterile fashion. A timeout was performed. A 23 French rigid cystoscope was  then inserted into the urethral meatus and advanced into the bladder under direct vision. A complete bladder survey revealed no intravesical pathology.  Her previously placed left ureteral stent was then grasped at its distal curl and retracted to the urethral meatus.  A Glidewire was then advanced through the lumen of the stent and up to the left renal pelvis, under fluoroscopic guidance.  A 5 French ureteral catheter was then inserted into the left ureteral orifice and a retrograde pyelogram was obtained, with the findings listed above.  A Glidewire was then used to intubate the lumen of the ureteral catheter and was advanced up to the left renal pelvis, under fluoroscopic guidance.  The catheter was then removed, leaving the wire in place.  A new 6 [place], 24 cm JJ stent was then advanced over the wire and into good position within the left collecting system, confirming placement via fluoroscopy.  Her previously placed right ureteral stent was then grasped at its distal curl and retracted to the urethral meatus.  A Glidewire was then advanced through the lumen of the stent and up to the right renal pelvis, under fluoroscopic guidance.  A 5 French ureteral catheter was then inserted into the right ureteral orifice and a retrograde pyelogram was obtained, with the findings listed above.  A Glidewire was then used to intubate the lumen of the ureteral catheter and was advanced up to the right renal pelvis, under fluoroscopic guidance.  The catheter was then removed, leaving the wire in place.  A new 6 [place], 24 cm JJ stent was then advanced over the wire and into good position within the right collecting system, confirming placement via fluoroscopy.  Plan: Follow-up in resident clinic in 3 months to plan her next stent exchange

## 2022-04-20 ENCOUNTER — Encounter (HOSPITAL_COMMUNITY): Payer: Self-pay | Admitting: Urology

## 2022-06-03 IMAGING — CT CT ABD-PELV W/O CM
1 of 2 series · 14 of 32 positions shown, 18 images · non-contrast
Comparison: None.

CLINICAL DATA: Hematuria x 2 weeks Hx of ureteral stents Gb Hx of
cervical ca, chemo xrt

EXAM:
CT ABDOMEN AND PELVIS WITHOUT CONTRAST
TECHNIQUE: Multidetector CT imaging of the abdomen and pelvis was performed
following the standard protocol without IV contrast.

[Series 2: renal stone 5mm · axial · 0.96mm/px · z∈[-517,-87]mm · 14 of 96 slices shown, 18 images]
[im 5/96  soft-tissue]
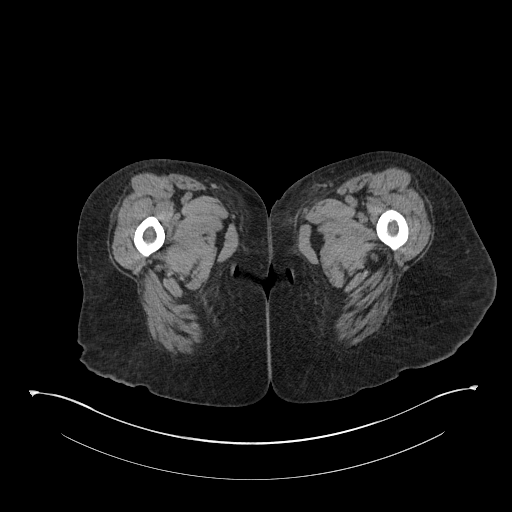
[im 5/96  bone]
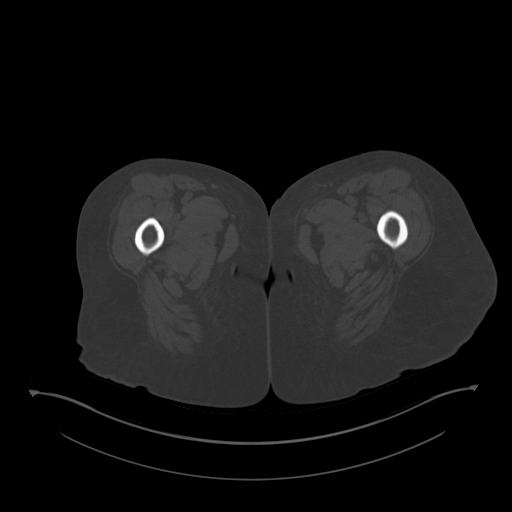
[im 13/96  soft-tissue]
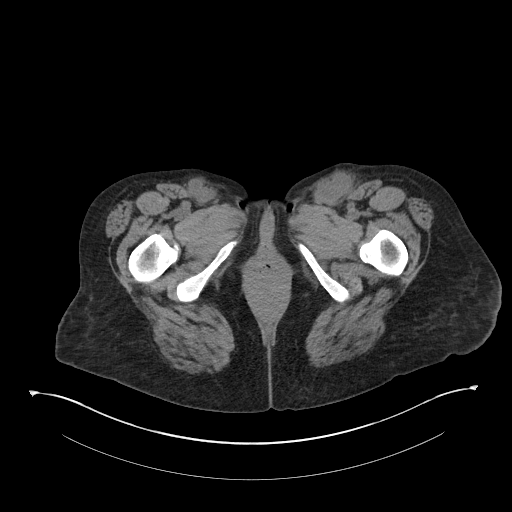
[im 21/96  soft-tissue]
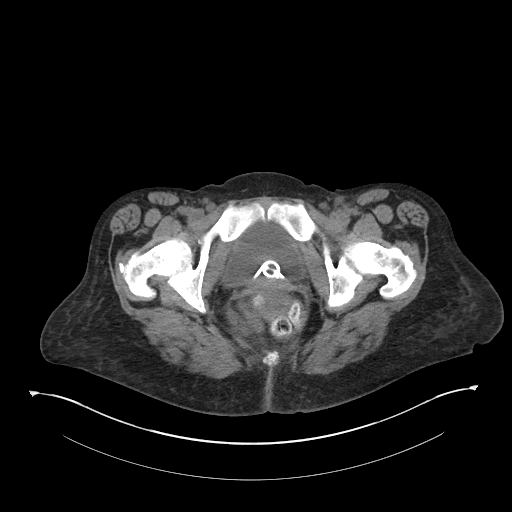
[im 29/96  soft-tissue]
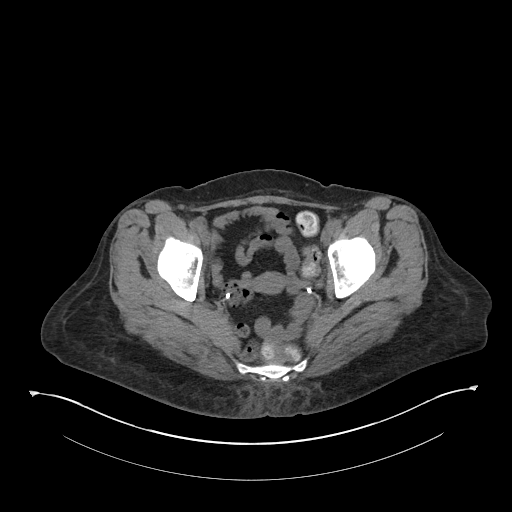
[im 38/96  soft-tissue]
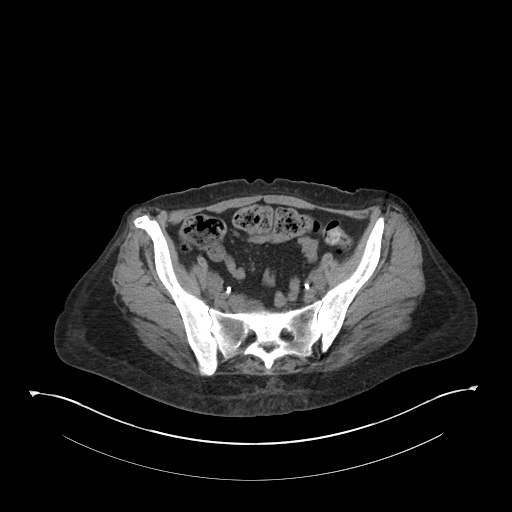
[im 46/96  soft-tissue]
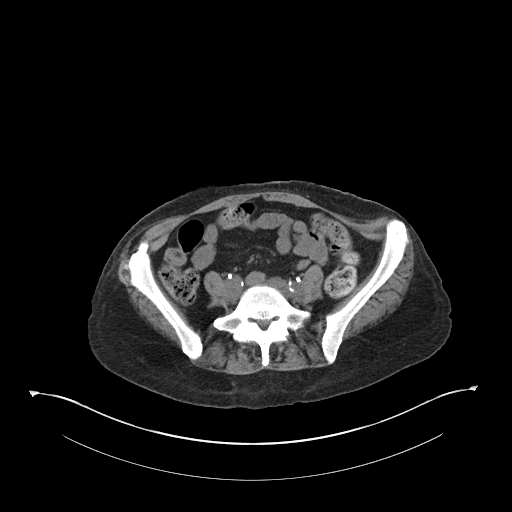
[im 50/96  soft-tissue]
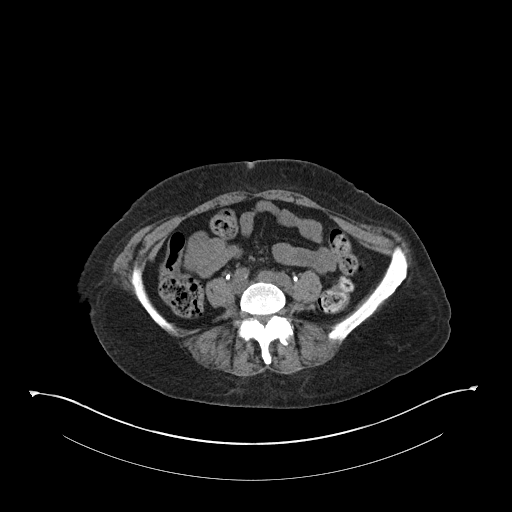
[im 58/96  soft-tissue]
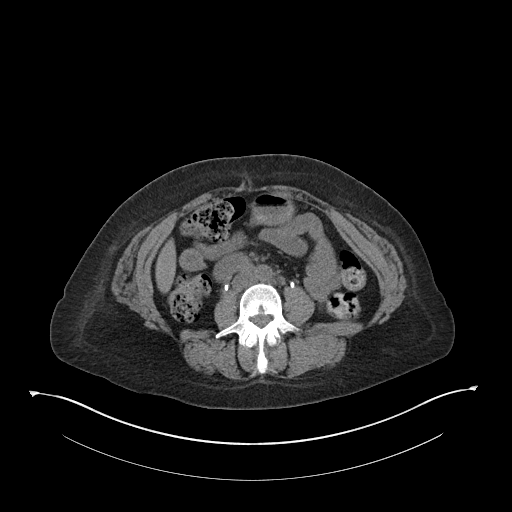
[im 67/96  soft-tissue]
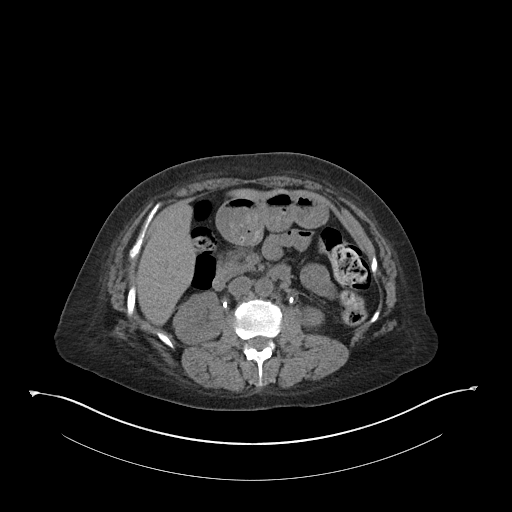
[im 67/96  bone]
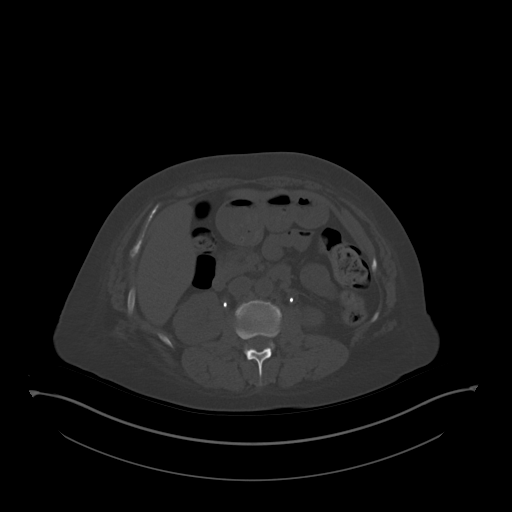
[im 75/96  soft-tissue]
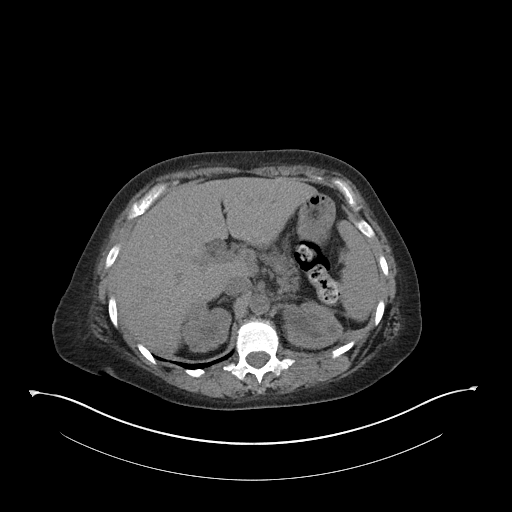
[im 79/96  lung]
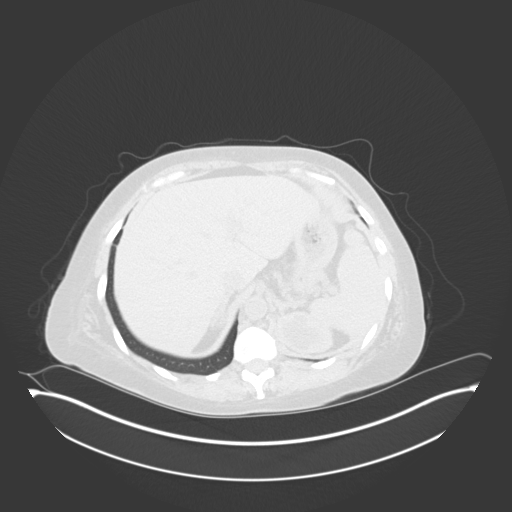
[im 83/96  soft-tissue]
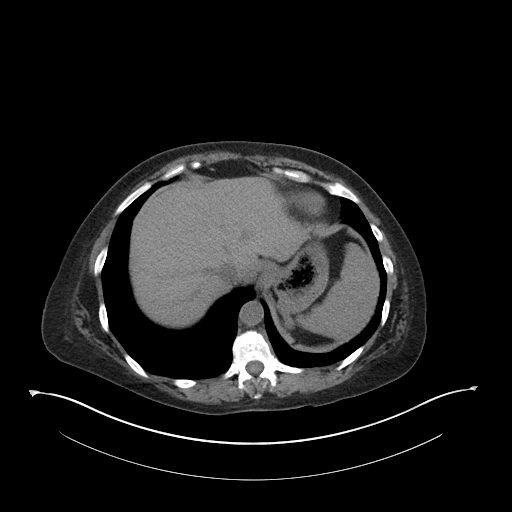
[im 83/96  lung]
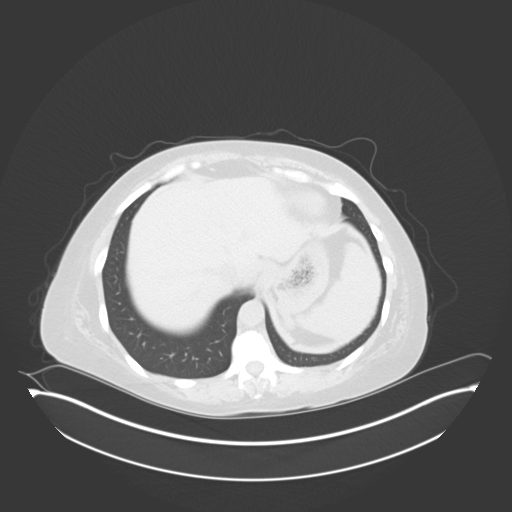
[im 87/96  lung]
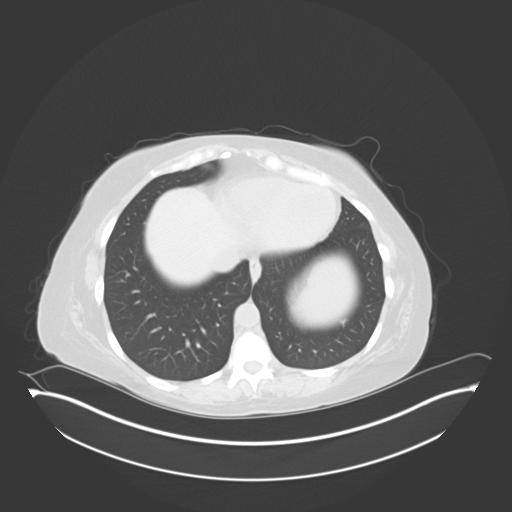
[im 91/96  soft-tissue]
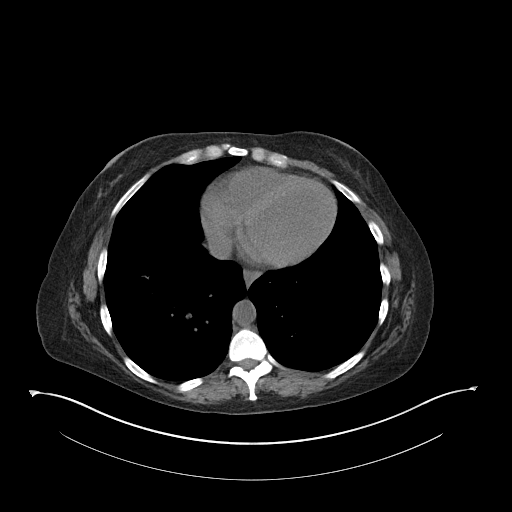
[im 91/96  lung]
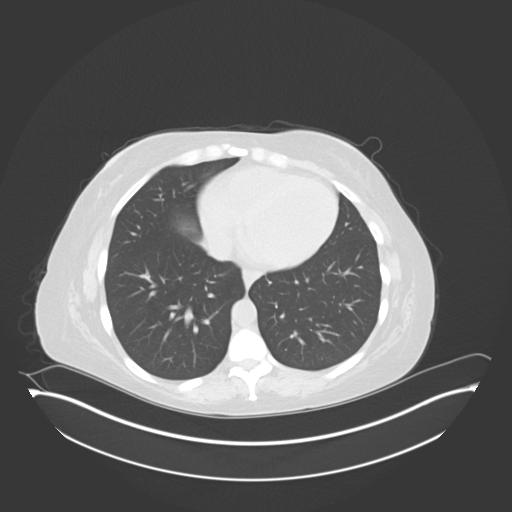

[14 of 32 positions shown; findings below may reference images not displayed]

FINDINGS: Lower chest: No acute abnormality. Cardiac changes suggestive of
anemia.

Hepatobiliary: No focal liver abnormality. Status post
cholecystectomy. No biliary dilatation.

Pancreas: No focal lesion. Normal pancreatic contour. No surrounding
inflammatory changes. No main pancreatic ductal dilatation.

Spleen: Normal in size without focal abnormality.

Adrenals/Urinary Tract:

No adrenal nodule bilaterally.

Bilateral ureteral stents in grossly appropriate position with
associated right moderate and left severe hydronephrosis. No
nephrolithiasis bilaterally. No definite contour-deforming renal
mass.

No ureterolithiasis or hydroureter.

The urinary bladder is unremarkable.

Stomach/Bowel: Stomach is within normal limits. No evidence of bowel
wall thickening or dilatation. Stool throughout the colon. Appendix
appears normal.

Vascular/Lymphatic: No abdominal aorta or iliac aneurysm. Mild
atherosclerotic plaque. No abdominal or pelvic lymphadenopathy.
Bilateral inguinal lymphadenopathy measuring up to 2.5 cm on the
left and 2.2 cm on the right. Multiple prominent retroperitoneal
lymph nodes as well as the left periaortic 1.4 cm enlarged lymph
node.

Reproductive: Uterus and bilateral adnexa are unremarkable.

Other: No intraperitoneal free fluid. No intraperitoneal free gas.
No organized fluid collection.

Musculoskeletal:

No abdominal wall hernia or abnormality. Mild subcutaneus soft
tissue edema of the hips.

No suspicious lytic or blastic osseous lesions. No acute displaced
fracture.
IMPRESSION: 1. Bilateral inguinal lymphadenopathy. Associated multiple prominent
retroperitoneal lymph nodes with an enlarged left periaortic lymph
node measuring up to 1.4 cm. Markedly limited evaluation on this
noncontrast study. Findings concerning for a patient with history of
malignancy.
2. Bilateral ureteral stents in grossly appropriate position with
associated right moderate and left severe hydronephrosis.
3. Stool throughout the colon.
4.  Aortic Atherosclerosis ([id number]).

These results will be called to the ordering clinician or
representative by the Radiologist Assistant, and communication
documented in the PACS or [REDACTED].

## 2022-11-07 ENCOUNTER — Encounter (HOSPITAL_BASED_OUTPATIENT_CLINIC_OR_DEPARTMENT_OTHER): Payer: Self-pay | Admitting: Urology

## 2022-11-07 ENCOUNTER — Other Ambulatory Visit: Payer: Self-pay | Admitting: Urology

## 2022-11-07 NOTE — Progress Notes (Signed)
Spoke w/ via phone for pre-op interview--- Marie Brewer Lab needs dos----NONE               Lab results------Current EKG in Epic dated 04/17/22 COVID test -----patient states asymptomatic no test needed Arrive at ------- NPO after MN NO Solid Food.  Clear liquids from MN until---0800 Med rec completed Medications to take morning of surgery -----NONE Diabetic medication ----- Patient instructed no nail polish to be worn day of surgery Patient instructed to bring photo id and insurance card day of surgery Patient aware to have Driver (ride ) / caregiver  Sister Marie Brewer  for 24 hours after surgery  Patient Special Instructions ----- Pre-Op special Istructions ----- Patient verbalized understanding of instructions that were given at this phone interview. Patient denies shortness of breath, chest pain, fever, cough at this phone interview.

## 2022-11-14 NOTE — Anesthesia Preprocedure Evaluation (Addendum)
Anesthesia Evaluation  Patient identified by MRN, date of birth, ID band Patient awake    Reviewed: Allergy & Precautions, NPO status , Patient's Chart, lab work & pertinent test results  Airway Mallampati: II  TM Distance: >3 FB Neck ROM: Full    Dental no notable dental hx. (+) Dental Advisory Given, Edentulous Lower, Edentulous Upper   Pulmonary Current SmokerPatient did not abstain from smoking.   Pulmonary exam normal breath sounds clear to auscultation       Cardiovascular Exercise Tolerance: Good negative cardio ROS Normal cardiovascular exam Rhythm:Regular Rate:Normal     Neuro/Psych  Headaches  negative psych ROS   GI/Hepatic negative GI ROS,,,(+)     substance abuse  IV drug use  Endo/Other  Obesity   Renal/GU Renal InsufficiencyRenal diseaseBilateral hydronephrosis   Bilateral ureteral obstruction     Musculoskeletal negative musculoskeletal ROS (+)  narcotic dependent  Abdominal   Peds  Hematology   Anesthesia Other Findings All: imitrex, cephalexin  Reproductive/Obstetrics H/o cervical cancer                              Anesthesia Physical Anesthesia Plan  ASA: 3  Anesthesia Plan: General   Post-op Pain Management: Toradol IV (intra-op)*, Tylenol PO (pre-op)* and Precedex   Induction: Intravenous  PONV Risk Score and Plan: 2 and Midazolam, Dexamethasone, Ondansetron and Treatment may vary due to age or medical condition  Airway Management Planned: LMA  Additional Equipment: None  Intra-op Plan:   Post-operative Plan: Extubation in OR  Informed Consent: I have reviewed the patients History and Physical, chart, labs and discussed the procedure including the risks, benefits and alternatives for the proposed anesthesia with the patient or authorized representative who has indicated his/her understanding and acceptance.     Dental advisory given  Plan  Discussed with: CRNA  Anesthesia Plan Comments:         Anesthesia Quick Evaluation

## 2022-11-15 ENCOUNTER — Encounter (HOSPITAL_BASED_OUTPATIENT_CLINIC_OR_DEPARTMENT_OTHER): Payer: Self-pay | Admitting: Urology

## 2022-11-15 ENCOUNTER — Ambulatory Visit (HOSPITAL_BASED_OUTPATIENT_CLINIC_OR_DEPARTMENT_OTHER)
Admission: RE | Admit: 2022-11-15 | Discharge: 2022-11-15 | Disposition: A | Discharge status: Home / Self Care | Payer: Commercial Managed Care - HMO | Attending: Urology | Admitting: Urology

## 2022-11-15 ENCOUNTER — Encounter (HOSPITAL_BASED_OUTPATIENT_CLINIC_OR_DEPARTMENT_OTHER)
Admission: RE | Disposition: A | Discharge status: Home / Self Care | Payer: Self-pay | Source: Home / Self Care | Attending: Urology

## 2022-11-15 ENCOUNTER — Ambulatory Visit (HOSPITAL_BASED_OUTPATIENT_CLINIC_OR_DEPARTMENT_OTHER): Payer: Commercial Managed Care - HMO | Admitting: Anesthesiology

## 2022-11-15 DIAGNOSIS — R310 Gross hematuria: Secondary | ICD-10-CM | POA: Insufficient documentation

## 2022-11-15 DIAGNOSIS — Z923 Personal history of irradiation: Secondary | ICD-10-CM | POA: Diagnosis not present

## 2022-11-15 DIAGNOSIS — N1330 Unspecified hydronephrosis: Secondary | ICD-10-CM

## 2022-11-15 DIAGNOSIS — R519 Headache, unspecified: Secondary | ICD-10-CM | POA: Insufficient documentation

## 2022-11-15 DIAGNOSIS — N131 Hydronephrosis with ureteral stricture, not elsewhere classified: Secondary | ICD-10-CM | POA: Diagnosis present

## 2022-11-15 DIAGNOSIS — R39198 Other difficulties with micturition: Secondary | ICD-10-CM | POA: Diagnosis not present

## 2022-11-15 DIAGNOSIS — Z8541 Personal history of malignant neoplasm of cervix uteri: Secondary | ICD-10-CM | POA: Insufficient documentation

## 2022-11-15 DIAGNOSIS — F17210 Nicotine dependence, cigarettes, uncomplicated: Secondary | ICD-10-CM

## 2022-11-15 DIAGNOSIS — Z9221 Personal history of antineoplastic chemotherapy: Secondary | ICD-10-CM | POA: Diagnosis not present

## 2022-11-15 DIAGNOSIS — N289 Disorder of kidney and ureter, unspecified: Secondary | ICD-10-CM

## 2022-11-15 DIAGNOSIS — R3912 Poor urinary stream: Secondary | ICD-10-CM | POA: Diagnosis not present

## 2022-11-15 DIAGNOSIS — F17200 Nicotine dependence, unspecified, uncomplicated: Secondary | ICD-10-CM | POA: Diagnosis not present

## 2022-11-15 HISTORY — PX: CYSTOSCOPY WITH STENT PLACEMENT: SHX5790

## 2022-11-15 SURGERY — CYSTOSCOPY, WITH STENT INSERTION
Anesthesia: General | Site: Ureter | Laterality: Bilateral

## 2022-11-15 MED ORDER — ONDANSETRON HCL 4 MG/2ML IJ SOLN: 4.0000 mg | Freq: Once | INTRAMUSCULAR | Status: DC | PRN

## 2022-11-15 MED ORDER — OXYCODONE HCL 5 MG/5ML PO SOLN: 5.0000 mg | Freq: Once | ORAL | Status: DC | PRN

## 2022-11-15 MED ORDER — LIDOCAINE HCL (PF) 2 % IJ SOLN
INTRAMUSCULAR | Status: AC
  Filled 2022-11-15: qty 5

## 2022-11-15 MED ORDER — KETOROLAC TROMETHAMINE 30 MG/ML IJ SOLN
INTRAMUSCULAR | Status: AC
  Filled 2022-11-15: qty 1

## 2022-11-15 MED ORDER — DEXAMETHASONE SODIUM PHOSPHATE 10 MG/ML IJ SOLN
INTRAMUSCULAR | Status: AC
  Filled 2022-11-15: qty 1

## 2022-11-15 MED ORDER — LACTATED RINGERS IV SOLN: INTRAVENOUS | Status: DC

## 2022-11-15 MED ORDER — ONDANSETRON HCL 4 MG/2ML IJ SOLN
INTRAMUSCULAR | Status: DC | PRN
  Administered 2022-11-15: 4 mg via INTRAVENOUS

## 2022-11-15 MED ORDER — KETOROLAC TROMETHAMINE 30 MG/ML IJ SOLN: 30.0000 mg | Freq: Once | INTRAMUSCULAR | Status: DC | PRN

## 2022-11-15 MED ORDER — PROPOFOL 10 MG/ML IV BOLUS
INTRAVENOUS | Status: DC | PRN
  Administered 2022-11-15: 150 mg via INTRAVENOUS

## 2022-11-15 MED ORDER — FENTANYL CITRATE (PF) 100 MCG/2ML IJ SOLN
INTRAMUSCULAR | Status: DC | PRN
  Administered 2022-11-15 (×4): 25 ug via INTRAVENOUS

## 2022-11-15 MED ORDER — LIDOCAINE 2% (20 MG/ML) 5 ML SYRINGE
INTRAMUSCULAR | Status: DC | PRN
  Administered 2022-11-15: 60 mg via INTRAVENOUS

## 2022-11-15 MED ORDER — ONDANSETRON HCL 4 MG/2ML IJ SOLN
INTRAMUSCULAR | Status: AC
  Filled 2022-11-15: qty 2

## 2022-11-15 MED ORDER — OXYCODONE HCL 5 MG PO TABS: 5.0000 mg | ORAL_TABLET | Freq: Once | ORAL | Status: DC | PRN

## 2022-11-15 MED ORDER — CIPROFLOXACIN IN D5W 400 MG/200ML IV SOLN
INTRAVENOUS | Status: AC
  Filled 2022-11-15: qty 200

## 2022-11-15 MED ORDER — SODIUM CHLORIDE 0.9 % IV SOLN: INTRAVENOUS | Status: DC

## 2022-11-15 MED ORDER — HYDROMORPHONE HCL 1 MG/ML IJ SOLN
INTRAMUSCULAR | Status: AC
  Filled 2022-11-15: qty 1

## 2022-11-15 MED ORDER — MIDAZOLAM HCL 2 MG/2ML IJ SOLN
INTRAMUSCULAR | Status: AC
  Filled 2022-11-15: qty 2

## 2022-11-15 MED ORDER — HYDROMORPHONE HCL 1 MG/ML IJ SOLN: 0.2500 mg | INTRAMUSCULAR | Status: DC | PRN

## 2022-11-15 MED ORDER — SODIUM CHLORIDE 0.9 % IR SOLN
Status: DC | PRN
  Administered 2022-11-15: 3000 mL

## 2022-11-15 MED ORDER — DEXMEDETOMIDINE HCL IN NACL 80 MCG/20ML IV SOLN
INTRAVENOUS | Status: AC
  Filled 2022-11-15: qty 20

## 2022-11-15 MED ORDER — FENTANYL CITRATE (PF) 100 MCG/2ML IJ SOLN
INTRAMUSCULAR | Status: AC
  Filled 2022-11-15: qty 2

## 2022-11-15 MED ORDER — DEXMEDETOMIDINE HCL IN NACL 80 MCG/20ML IV SOLN
INTRAVENOUS | Status: DC | PRN
  Administered 2022-11-15: 12 ug via BUCCAL
  Administered 2022-11-15: 8 ug via BUCCAL

## 2022-11-15 MED ORDER — PROPOFOL 10 MG/ML IV BOLUS
INTRAVENOUS | Status: AC
  Filled 2022-11-15: qty 20

## 2022-11-15 MED ORDER — CIPROFLOXACIN IN D5W 400 MG/200ML IV SOLN
400.0000 mg | Freq: Two times a day (BID) | INTRAVENOUS | Status: DC
  Administered 2022-11-15: 400 mg via INTRAVENOUS

## 2022-11-15 SURGICAL SUPPLY — 21 items
APL SKNCLS STERI-STRIP NONHPOA (GAUZE/BANDAGES/DRESSINGS)
BAG DRAIN URO-CYSTO SKYTR STRL (DRAIN) ×1 IMPLANT
BAG DRN UROCATH (DRAIN) ×1
BENZOIN TINCTURE PRP APPL 2/3 (GAUZE/BANDAGES/DRESSINGS) IMPLANT
CATH URETL OPEN 5X70 (CATHETERS) IMPLANT
CLOTH BEACON ORANGE TIMEOUT ST (SAFETY) ×1 IMPLANT
GLOVE BIO SURGEON STRL SZ7.5 (GLOVE) ×1 IMPLANT
GOWN STRL REUS W/TWL XL LVL3 (GOWN DISPOSABLE) ×1 IMPLANT
GUIDEWIRE STR DUAL SENSOR (WIRE) IMPLANT
GUIDEWIRE ZIPWRE .038 STRAIGHT (WIRE) ×1 IMPLANT
IV NS IRRIG 3000ML ARTHROMATIC (IV SOLUTION) ×2 IMPLANT
KIT TURNOVER CYSTO (KITS) ×1 IMPLANT
MANIFOLD NEPTUNE II (INSTRUMENTS) ×1 IMPLANT
NS IRRIG 500ML POUR BTL (IV SOLUTION) ×1 IMPLANT
PACK CYSTO (CUSTOM PROCEDURE TRAY) ×1 IMPLANT
SLEEVE SCD COMPRESS KNEE MED (STOCKING) ×1 IMPLANT
STENT URET 6FRX24 CONTOUR (STENTS) IMPLANT
STRIP CLOSURE SKIN 1/2X4 (GAUZE/BANDAGES/DRESSINGS) IMPLANT
SYR 10ML LL (SYRINGE) ×1 IMPLANT
TUBE CONNECTING 12X1/4 (SUCTIONS) IMPLANT
TUBING UROLOGY SET (TUBING) ×1 IMPLANT

## 2022-11-15 NOTE — Discharge Instructions (Signed)

## 2022-11-15 NOTE — Anesthesia Procedure Notes (Signed)
Procedure Name: LMA Insertion Date/Time: 11/15/2022 10:58 AM  Performed by: Jessica Priest, CRNAPre-anesthesia Checklist: Patient identified, Emergency Drugs available, Suction available, Patient being monitored and Timeout performed Patient Re-evaluated:Patient Re-evaluated prior to induction Oxygen Delivery Method: Circle system utilized Preoxygenation: Pre-oxygenation with 100% oxygen Induction Type: IV induction Ventilation: Mask ventilation without difficulty LMA: LMA inserted LMA Size: 4.0 Number of attempts: 1 Airway Equipment and Method: Bite block Placement Confirmation: positive ETCO2, breath sounds checked- equal and bilateral and CO2 detector Tube secured with: Tape Dental Injury: Teeth and Oropharynx as per pre-operative assessment

## 2022-11-15 NOTE — H&P (Signed)
Office Visit Report     11/07/2022   --------------------------------------------------------------------------------   Marie Brewer  MRN: [id number]  DOB: November 05, 1980, 42 year old Female  SSN:    PRIMARY CARE:     REFERRING:    PROVIDER:  Rhoderick Moody, M.D.  TREATING:  Anne Fu, NP  LOCATION:  [facility] [phone]     --------------------------------------------------------------------------------   CC/HPI: CC: Follow up for BL ureteral obstruction managed with indwelling stents   Initial visit 07/20/21 (Dr. Liliane Shi):  Ms. Lamay is a 42 year old female who was initially seen in consultation on 08/15/2020 due to bilateral hydronephrosis thought to be secondary to distal ureteral obstruction from previous chemoradiation for treatment of cervical cancer. She was also noted to have an esophageal perforation at that time. She was scheduled for follow-up on 08/31/2020, but failed to do so. She is presents today with retained stents for almost 12 months.  Today, she reports intermittent episodes of gross hematuria for the past several weeks. She also notes seeing small amounts of stone debris intermittently pass in her urine.  BMP from 08/22/2020 revealed a serum creatinine of 4.56 with an estimated GFR of 12 despite bilateral ureteral stents being in place.   Stent exchange 09/07/21 (Dr. Liliane Shi):  Successful BL stent exchange with 6Fr x 24cm JJ stents. BL distal strictures ~4-5 cm long with proximal hydronephrosis   Interval 01/05/22:  Since stent exchange patient has felt much improved. She no longer has gross hematuria and her flank pain is resolved. She has recently developed urinary urgency and frequency and some pain after voiding. She is unsure if this is related to a UTI or normal stent discomfort. She has oxybutynin at home but has never tried to take it. She denies fevers or chills. Feels to be making adequate amounts of urine.   04/11/2022: 42 year old female  with the above-noted past medical history. She is scheduled to undergo stent exchange on 04/19/2022. She presents today for preoperative appointment. She is tolerating her stents well. She denies changes to her medical history. She denies new allergies.   11/02/22: The patient presents today after being lost to follow-up to discuss stent exchange. She is starting to have more urinary urgency and episodes of incontinence. She denies flank pain, dysuria or hematuria. She also denies general malaise which was ever present after her prolonged stent implantation last year.   11/07/2022: Patient seen today due to worsening lower urinary tract symptoms. She states she had a recent GI illness and since then she has been having increased obstructive voiding symptoms. She complains of some pain and discomfort with the initiation of her stream as well as a weak and intermittent stream. Also complaining of some hesitancy and straining. PVR today is 167 mL but she did not go to the lab at time of office presentation. She is not having fevers or chills, nausea/vomiting at present time. She is having normal bowel movements. She denies gross hematuria. She is not having any colic type pain in the flank and lower back.     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 Patient denies hard to postpone urination, leakage of urine, and being pregnant.  Gastrointestinal (Upper):   Patient denies nausea, vomiting, and indigestion/ heartburn.  Gastrointestinal (Lower):   Patient denies diarrhea and constipation.  Constitutional:   Patient denies fever, night sweats, weight loss, and fatigue.  Skin:   Patient denies skin rash/ lesion and itching.  Eyes:   Patient denies blurred vision and double vision.  Ears/ Nose/ Throat:   Patient denies sore throat and sinus problems.  Hematologic/Lymphatic:   Patient denies swollen glands and easy bruising.  Cardiovascular:   Patient denies leg swelling and chest pains.  Respiratory:   Patient denies cough and shortness of breath.  Endocrine:   Patient denies excessive thirst.  Musculoskeletal:   Patient denies back pain and joint pain.  Neurological:   Patient denies headaches and dizziness.  Psychologic:   Patient denies depression and anxiety.   Notes: Slow stream    VITAL SIGNS: None   MULTI-SYSTEM PHYSICAL EXAMINATION:    Constitutional: Well-nourished. No physical deformities. Normally developed. Good grooming. Appears in no acute distress.  Neck: Neck symmetrical, not swollen. Normal tracheal position.  Respiratory: No labored breathing, no use of accessory muscles.   Cardiovascular: Normal temperature, normal extremity pulses, no swelling, no varicosities.  Skin: No paleness, no jaundice, no cyanosis. No lesion, no ulcer, no rash.  Neurologic / Psychiatric: Oriented to time, oriented to place, oriented to person. No depression, no anxiety, no agitation.  Gastrointestinal: No mass, no tenderness, no rigidity, non obese abdomen. No CVA or flank tenderness  Musculoskeletal: Normal gait and station of head and neck.     Complexity of Data:  Source Of History:  Patient, Medical Record Summary  Records Review:   Previous Doctor Records, Previous Hospital Records, Previous Patient Records  Urine  Test Review:   Urinalysis, Urine Culture  Urodynamics Review:   Review Bladder Scan  X-Ray Review: C.T. Abdomen/Pelvis: Reviewed Films. Reviewed Report.     11/07/22  Urinalysis  Urine Appearance Cloudy   Urine Color Straw   Urine Glucose Neg mg/dL  Urine Bilirubin Neg mg/dL  Urine Ketones Neg mg/dL  Urine Specific Gravity 1.015   Urine Blood 3+ ery/uL  Urine pH 6.0   Urine Protein 2+ mg/dL  Urine Urobilinogen 0.2 mg/dL  Urine Nitrites Positive   Urine Leukocyte Esterase 3+ leu/uL  Urine WBC/hpf >60/hpf   Urine RBC/hpf 3 - 10/hpf   Urine Epithelial Cells 0 - 5/hpf   Urine Bacteria Mod (26-50/hpf)   Urine Mucous Not Present   Urine Yeast NS (Not Seen)   Urine Trichomonas Not Present   Urine Cystals NS (Not Seen)   Urine Casts NS (Not Seen)   Urine Sperm Not Present    PROCEDURES:         PVR Ultrasound - 16109  Scanned Volume: 167 cc         Urinalysis w/Scope Dipstick Dipstick Cont'd Micro  Color: Straw Bilirubin: Neg mg/dL WBC/hpf: >60/AVW  Appearance: Cloudy Ketones: Neg mg/dL RBC/hpf: 3 - 09/WJX  Specific Gravity: 1.015 Blood: 3+ ery/uL Bacteria: Mod (26-50/hpf)  pH: 6.0 Protein: 2+ mg/dL Cystals: NS (Not Seen)  Glucose: Neg mg/dL Urobilinogen: 0.2  mg/dL Casts: NS (Not Seen)    Nitrites: Positive Trichomonas: Not Present    Leukocyte Esterase: 3+ leu/uL Mucous: Not Present      Epithelial Cells: 0 - 5/hpf      Yeast: NS (Not Seen)      Sperm: Not Present    Notes: Unspun micro due to increased WBC's.    ASSESSMENT:      ICD-10 Details  1 GU:   Hydronephrosis - N13.0 Chronic, Threat to Bodily Function  2   Incomplete bladder emptying - R39.14 Undiagnosed New Problem  3   Straining on Urination - R39.16 Undiagnosed New Problem   PLAN:            Medications New Meds: Tamsulosin Hcl 0.4 mg capsule 1 capsule PO Daily   #30  2 Refill(s)  Pharmacy Name:  [id number]  Address:  [address]   [place], [place] [id number]  Phone:  [phone]  Fax:  [phone]            Orders Labs Urine Culture, Urinalysis w/Scope          Schedule Return Visit/Planned Activity: Keep Scheduled Appointment - Follow up MD, Schedule Surgery          Document Letter(s):  Created for Patient: Clinical Summary         Notes:   Patient is having increased obstructive voiding symptoms following a GI illness. She does have an elevated residual today but not to the point where I would consider placing a Foley catheter. I am going to have her start some tamsulosin which certainly can augment any exacerbations of renal colic as well as improve her voiding symptoms to allow her to more appropriately empty the bladder. She is going to go by the lab prior to leaving clinic today and leave a voided urine specimen so that a urine culture can be sent and if indicated once reviewed I will prescribe appropriate antimicrobial treatment prior to her upcoming procedure. Moving forward she will keep her scheduled follow-up date for cystoscopy with bilateral ureteral stent exchange with Dr. Liliane Shi on 4/17.   The risk, benefits and alternatives of cystoscopy with bilateral ureteral stent exchange was discussed with patient.  She voices understanding and wishes to proceed.

## 2022-11-15 NOTE — Anesthesia Postprocedure Evaluation (Signed)
Anesthesia Post Note  Patient: Marie Brewer  Procedure(s) Performed: CYSTOSCOPY WITH BILATERAL STENT EXCHANGE (Bilateral: Ureter)     Patient location during evaluation: PACU Anesthesia Type: General Level of consciousness: awake and alert Pain management: pain level controlled Vital Signs Assessment: post-procedure vital signs reviewed and stable Respiratory status: spontaneous breathing, nonlabored ventilation, respiratory function stable and patient connected to nasal cannula oxygen Cardiovascular status: blood pressure returned to baseline and stable Postop Assessment: no apparent nausea or vomiting Anesthetic complications: no  No notable events documented.  Last Vitals:  Vitals:   11/15/22 1215 11/15/22 1230  BP: (!) 92/54 123/71  Pulse: (!) 52 64  Resp: 13 14  Temp:  36.4 C  SpO2: 99% 100%    Last Pain:  Vitals:   11/15/22 1230  TempSrc:   PainSc: 4                  Trevor Iha

## 2022-11-15 NOTE — Transfer of Care (Signed)
Immediate Anesthesia Transfer of Care Note  Patient: Marie Brewer  Procedure(s) Performed: Procedure(s) (LRB): CYSTOSCOPY WITH BILATERAL STENT EXCHANGE (Bilateral)  Patient Location: PACU  Anesthesia Type: General  Level of Consciousness: awake, sedated, patient cooperative and responds to stimulation  Airway & Oxygen Therapy: Patient Spontanous Breathing and Patient connected to [place] oxygen  Post-op Assessment: Report given to PACU RN, Post -op Vital signs reviewed and stable and Patient moving all extremities  Post vital signs: Reviewed and stable  Complications: No apparent anesthesia complications

## 2022-11-15 NOTE — Op Note (Signed)
Operative Note  Preoperative diagnosis:  1.  Bilateral hydronephrosis 2.  History of cervical cancer requiring chemotherapy and radiation  Postoperative diagnosis: Same  Procedure(s): 1.  Cystoscopy with bilateral ureteral stent exchange  Surgeon: Rhoderick Moody, MD  Assistants:  None  Anesthesia:  General  Complications:  None  EBL: None  Specimens: 1.  Previously placed bilateral ureteral stents were removed intact, inspected and discarded  Drains/Catheters: 1.  Bilateral 6 French, 24 cm JJ stents without tethers  Intraoperative findings:   Bilateral stents were placed in good position  Indication:  Marie Brewer is a 42 y.o. female with with bilateral ureteral obstruction likely secondary to prior pelvic/retroperitoneal fibrosis following chemo/radiation for the treatment of cervical cancer.  She is here today for bilateral ureteral stent exchange.  She has been consented for the above procedures, voices understanding and wishes to proceed.  Description of procedure:  After informed consent was obtained, the patient was brought to the operating room and general LMA anesthesia was administered. The patient was then placed in the dorsolithotomy position and prepped and draped in the usual sterile fashion. A timeout was performed. A 23 French rigid cystoscope was then inserted into the urethral meatus and advanced into the bladder under direct vision. A complete bladder survey revealed no intravesical pathology.  Her previously placed right ureteral stent was grasped at its distal curl and retracted to the urethral meatus.  A Glidewire was then advanced through the lumen of the stent up to the right renal pelvis, or fluoroscopic guidance.  The previously placed stent was removed intact, inspected and discarded.  A new 6 [place], 24 cm JJ stent was then placed over the wire and into position within the right collecting system, confirming placement via fluoroscopy.  Her  previously placed left ureteral stent was removed in a similar fashion.  A new 6 [place], 24 cm JJ stent was then advanced over the Glidewire and into good position within the left collecting system, confirming placement via fluoroscopy.  The patient's bladder was drained.  She tolerated the procedure well and was transferred to the postanesthesia in stable condition.  Plan: Discharge home.

## 2022-11-16 ENCOUNTER — Encounter (HOSPITAL_BASED_OUTPATIENT_CLINIC_OR_DEPARTMENT_OTHER): Payer: Self-pay | Admitting: Urology

## 2023-01-26 ENCOUNTER — Encounter (HOSPITAL_COMMUNITY): Payer: Self-pay

## 2023-01-26 ENCOUNTER — Ambulatory Visit (HOSPITAL_COMMUNITY)
Admission: EM | Admit: 2023-01-26 | Discharge: 2023-01-26 | Disposition: A | Discharge status: Home / Self Care | Payer: Commercial Managed Care - HMO | Attending: Physician Assistant | Admitting: Physician Assistant

## 2023-01-26 DIAGNOSIS — N3001 Acute cystitis with hematuria: Secondary | ICD-10-CM

## 2023-01-26 DIAGNOSIS — N184 Chronic kidney disease, stage 4 (severe): Secondary | ICD-10-CM | POA: Diagnosis present

## 2023-01-26 LAB — BASIC METABOLIC PANEL
Anion gap: 10 (ref 5–15)
BUN: 52 mg/dL — ABNORMAL HIGH (ref 6–20)
CO2: 20 mmol/L — ABNORMAL LOW (ref 22–32)
Calcium: 8.6 mg/dL — ABNORMAL LOW (ref 8.9–10.3)
Chloride: 102 mmol/L (ref 98–111)
Creatinine, Ser: 4.05 mg/dL — ABNORMAL HIGH (ref 0.44–1.00)
GFR, Estimated: 13 mL/min — ABNORMAL LOW (ref >60)
Glucose, Bld: 94 mg/dL (ref 70–99)
Potassium: 3.5 mmol/L (ref 3.5–5.1)
Sodium: 132 mmol/L — ABNORMAL LOW (ref 135–145)

## 2023-01-26 LAB — CBC WITH DIFFERENTIAL/PLATELET
Abs Immature Granulocytes: 0.02 10*3/uL (ref 0.00–0.07)
Basophils Absolute: 0 10*3/uL (ref 0.0–0.1)
Basophils Relative: 0 %
Eosinophils Absolute: 0.2 10*3/uL (ref 0.0–0.5)
Eosinophils Relative: 3 %
HCT: 21.9 % — ABNORMAL LOW (ref 36.0–46.0)
Hemoglobin: 6.8 g/dL — CL (ref 12.0–15.0)
Immature Granulocytes: 0 %
Lymphocytes Relative: 13 %
Lymphs Abs: 1 10*3/uL (ref 0.7–4.0)
MCH: 28.5 pg (ref 26.0–34.0)
MCHC: 31.1 g/dL (ref 30.0–36.0)
MCV: 91.6 fL (ref 80.0–100.0)
Monocytes Absolute: 0.5 10*3/uL (ref 0.1–1.0)
Monocytes Relative: 7 %
Neutro Abs: 5.8 10*3/uL (ref 1.7–7.7)
Neutrophils Relative %: 77 %
Platelets: 307 10*3/uL (ref 150–400)
RBC: 2.39 MIL/uL — ABNORMAL LOW (ref 3.87–5.11)
RDW: 15.6 % — ABNORMAL HIGH (ref 11.5–15.5)
WBC: 7.5 10*3/uL (ref 4.0–10.5)
nRBC: 0 % (ref 0.0–0.2)

## 2023-01-26 LAB — POCT URINALYSIS DIP (MANUAL ENTRY)
Glucose, UA: NEGATIVE mg/dL
Nitrite, UA: POSITIVE — AB
Protein Ur, POC: 30 mg/dL — AB
Spec Grav, UA: 1.02 (ref 1.010–1.025)
Urobilinogen, UA: 0.2 E.U./dL
pH, UA: 6 (ref 5.0–8.0)

## 2023-01-26 LAB — POCT URINE PREGNANCY: Preg Test, Ur: NEGATIVE

## 2023-01-26 MED ORDER — CIPROFLOXACIN HCL 500 MG PO TABS: 500.0000 mg | ORAL_TABLET | ORAL | 0 refills | Status: DC

## 2023-01-26 NOTE — ED Provider Notes (Signed)
[facility]    CSN: [id number] Arrival date & time: 01/26/23  1502      History   Chief Complaint Chief Complaint  Patient presents with   Urinary Tract Infection    HPI Marie Brewer is a 42 y.o. female.   Patient presents today with a 4-day history of UTI symptoms.  She reports urinary frequency, urgency, dysuria.  Denies any abdominal pain, pelvic pain, fever, nausea, vomiting, vaginal symptoms.  She has a history of recurrent UTI with last episode several months ago.  She did have cystoscopy with stent replacement April 2024 and is due to have them replaced again July 2024.  She has a history of ureteral obstruction/stenosis related to radiation several years ago for cervical cancer treatment.  She denies any additional recent urogenital procedures.  Denies any self-catheterization.  She does have a history of chronic kidney disease but is not on dialysis.  Her last metabolic panel obtained to 04/17/2022 showed creatinine of 4.08.  She denies any history of diabetes and does not take SGLT2 inhibitor.    Past Medical History:  Diagnosis Date   Anemia    Bilateral ureteral obstruction    Cancer (HCC)    Chronic kidney disease    History of blood transfusion 08/30/2021   1 unit   History of cancer chemotherapy    completed 06/ 2011 for cervical cancer   History of cervical cancer 08/2009   followed by [facility] center;  dx SCC Stage IIB,  completed chemoradiation 06/ 2011   History of radiation therapy    completed 06/ 2011 ffor cervical cancer   Migraine headache    Polysubstance abuse (HCC) 05/05/2020   heroin    Patient Active Problem List   Diagnosis Date Noted   UTI (urinary tract infection) 08/30/2021   Cystitis 08/30/2021   Anemia, unspecified    Deficiency anemia    ARF (acute renal failure) (HCC) 08/14/2020   Acute renal failure (ARF) (HCC) 08/14/2020   Cellulitis 05/08/2020   Edema of right foot 05/08/2020   AKI (acute kidney injury) (HCC)  05/08/2020   Sepsis (HCC) 05/06/2020   Sepsis due to cellulitis (HCC) 05/05/2020   Polysubstance abuse (HCC) 05/05/2020   Hypokalemia 05/05/2020   Hyponatremia 05/05/2020   Cervical ca (HCC) 07/30/2013    Past Surgical History:  Procedure Laterality Date   CHOLECYSTECTOMY     CYSTOSCOPY W/ URETERAL STENT PLACEMENT Bilateral 08/15/2020   Procedure: CYSTOSCOPY WITH RETROGRADE PYELOGRAM/URETERAL STENT PLACEMENT;  Surgeon: Rene Paci, MD;  Location: [facility] OR;  Service: Urology;  Laterality: Bilateral;   CYSTOSCOPY W/ URETERAL STENT PLACEMENT Bilateral 09/07/2021   Procedure: CYSTOSCOPY WITH RETROGRADE PYELOGRAM/URETERAL STENT EXCHANGE;  Surgeon: Rene Paci, MD;  Location: [facility];  Service: Urology;  Laterality: Bilateral;   CYSTOSCOPY W/ URETERAL STENT PLACEMENT Bilateral 04/19/2022   Procedure: CYSTOSCOPY WITH STENT EXCHANGE;  Surgeon: Rene Paci, MD;  Location: [facility];  Service: Urology;  Laterality: Bilateral;   CYSTOSCOPY WITH STENT PLACEMENT Bilateral 11/15/2022   Procedure: CYSTOSCOPY WITH BILATERAL STENT EXCHANGE;  Surgeon: Rene Paci, MD;  Location: [facility];  Service: Urology;  Laterality: Bilateral;   INCISION AND DRAINAGE ABSCESS  04/09/2003   @[facility]  ;  RIGHT SUBMANDIBULAR ABSCESS (LARGE) AND EXTRACTION MULTIPLE TEETH   IRRIGATION AND DEBRIDEMENT ABSCESS Bilateral 05/05/2020   Procedure: left forearm incision and drainage and aspiration of the right wrist joint;  Surgeon: Allena Napoleon, MD;  Location: [facility];  Service: Plastics;  Laterality: Bilateral;    OB History   No obstetric history on file.      Home Medications    Prior to Admission medications   Medication Sig Start Date End Date Taking? Authorizing Provider  ciprofloxacin (CIPRO) 500 MG tablet Take 1 tablet (500 mg total) by mouth daily. 01/26/23  Yes Ancelmo Hunt K, PA-C  ferrous sulfate 325 (65 FE) MG tablet Take 325 mg by mouth daily.   Yes  [provider]  Multiple Vitamin (MULTIVITAMIN WITH MINERALS) TABS tablet Take 1 tablet by mouth daily.   Yes [provider]  oxybutynin (DITROPAN) 5 MG tablet Take 5 mg by mouth every 8 (eight) hours as needed for bladder spasms.   Yes [provider]    Family History Family History  Problem Relation Age of Onset   Autism spectrum disorder Other     Social History Social History   Tobacco Use   Smoking status: Some Days    Packs/day: 0.25    Years: 10.00    Additional pack years: 0.00    Total pack years: 2.50    Types: Cigarettes   Smokeless tobacco: Never  Vaping Use   Vaping Use: Every day   Substances: Nicotine  Substance Use Topics   Alcohol use: No   Drug use: Not Currently    Types: Heroin     Allergies   Imitrex [sumatriptan base] and Cephalexin   Review of Systems Review of Systems  Constitutional:  Negative for activity change, appetite change, fatigue and fever.  Gastrointestinal:  Negative for abdominal pain, diarrhea and nausea.  Genitourinary:  Positive for urgency. Negative for dysuria, flank pain, frequency, hematuria, pelvic pain, vaginal bleeding, vaginal discharge and vaginal pain.  Musculoskeletal:  Negative for back pain.     Physical Exam Triage Vital Signs ED Triage Vitals  Enc Vitals Group     BP 01/26/23 1514 114/71     Pulse Rate 01/26/23 1514 85     Resp 01/26/23 1514 18     Temp 01/26/23 1514 98.8 F (37.1 C)     Temp Source 01/26/23 1514 Oral     SpO2 01/26/23 1514 97 %     Weight 01/26/23 1514 155 lb (70.3 kg)     Height 01/26/23 1514 5\' 6"  (1.676 m)     Head Circumference --      Peak Flow --      Pain Score 01/26/23 1512 8     Pain Loc --      Pain Edu? --      Excl. in GC? --    No data found.  Updated Vital Signs BP 114/71 (BP Location: Left Arm)   Pulse 85   Temp 98.8 F (37.1 C) (Oral)   Resp 18   Ht 5\' 6"  (1.676 m)   Wt 155 lb (70.3 kg)   SpO2 97%   BMI 25.02 kg/m    Visual Acuity Right Eye Distance:   Left Eye Distance:   Bilateral Distance:    Right Eye Near:   Left Eye Near:    Bilateral Near:     Physical Exam Vitals reviewed.  Constitutional:      General: She is awake. She is not in acute distress.    Appearance: Normal appearance. She is well-developed. She is not ill-appearing.     Comments: Very pleasant female appears stated age in no acute distress sitting comfortably in exam room  HENT:     Head: Normocephalic and atraumatic.  Cardiovascular:     Rate and Rhythm: Normal rate and regular rhythm.     Heart sounds: Normal heart sounds, S1 normal and S2 normal. No murmur heard. Pulmonary:     Effort: Pulmonary effort is normal.     Breath sounds: Normal breath sounds. No wheezing, rhonchi or rales.     Comments: Clear to auscultation bilaterally Abdominal:     General: Bowel sounds are normal.     Palpations: Abdomen is soft.     Tenderness: There is no abdominal tenderness. There is no right CVA tenderness, left CVA tenderness, guarding or rebound.     Comments: Benign abdominal exam  Psychiatric:        Behavior: Behavior is cooperative.      UC Treatments / Results  Labs (all labs ordered are listed, but only abnormal results are displayed) Labs Reviewed  POCT URINALYSIS DIP (MANUAL ENTRY) - Abnormal; Notable for the following components:      Result Value   Clarity, UA cloudy (*)    Bilirubin, UA small (*)    Ketones, POC UA small (15) (*)    Blood, UA large (*)    Protein Ur, POC =30 (*)    Nitrite, UA Positive (*)    Leukocytes, UA Small (1+) (*)    All other components within normal limits  URINE CULTURE  CBC WITH DIFFERENTIAL/PLATELET  BASIC METABOLIC PANEL  POCT URINE PREGNANCY    EKG   Radiology No results found.  Procedures Procedures (including critical care time)  Medications Ordered in UC Medications - No data to display  Initial Impression / Assessment and Plan / UC Course  I have  reviewed the triage vital signs and the nursing notes.  Pertinent labs & imaging results that were available during my care of the patient were reviewed by me and considered in my medical decision making (see chart for details).     Patient is well-appearing, afebrile, nontoxic, nontachycardic.  Urine consistent with infection.  She has hives with cephalexin.  Will treat with ciprofloxacin.  Given history of chronic kidney disease will dose this at 500 mg daily.  A CBC and CMP were obtained today and are pending.  We will contact her if these are abnormal and change her treatment plan.  Urine culture was obtained and is pending.  We will contact her if we need to change her antibiotics based on her culture results.  Recommended that she push fluids.  She was encouraged to contact her urologist and schedule appointment as soon as possible.  We did discuss that if her symptoms or not improving within a few days she should be reevaluated.  If she has any worsening symptoms including development of fever, back pain, abdominal pain, nausea, vomiting she needs to be seen immediately.  Strict return precautions given.  Final Clinical Impressions(s) / UC Diagnoses   Final diagnoses:  Acute cystitis with hematuria  Chronic kidney disease (CKD), stage IV (severe) (HCC)     Discharge Instructions      We are treating you for a urinary tract infection.  Take ciprofloxacin 500 mg daily.  Follow-up with urologist soon as possible.  I will contact you if your lab work is abnormal and we need to change her treatment plan.  I am also sending her urine for culture and we will contact you if we change her antibiotics.  If your symptoms or not improving within 1 to 2 days you should be reevaluated.  If at any point  you have worsening symptoms including fever, back pain, pain around your abdomen, nausea, vomiting you need to go to the emergency room.     ED Prescriptions     Medication Sig Dispense Auth.  Provider   ciprofloxacin (CIPRO) 500 MG tablet Take 1 tablet (500 mg total) by mouth daily. 10 tablet Chealsea Paske, Noberto Retort, PA-C      PDMP not reviewed this encounter.   Jeani Hawking, PA-C 01/26/23 1600

## 2023-01-26 NOTE — ED Triage Notes (Signed)
Patient took an otc UA that was positive. Patient having painful urination and difficulty urination onset 4 days

## 2023-01-26 NOTE — Discharge Instructions (Addendum)
We are treating you for a urinary tract infection.  Take ciprofloxacin 500 mg daily.  Follow-up with urologist soon as possible.  I will contact you if your lab work is abnormal and we need to change her treatment plan.  I am also sending her urine for culture and we will contact you if we change her antibiotics.  If your symptoms or not improving within 1 to 2 days you should be reevaluated.  If at any point you have worsening symptoms including fever, back pain, pain around your abdomen, nausea, vomiting you need to go to the emergency room.

## 2023-01-28 LAB — URINE CULTURE

## 2023-01-29 LAB — URINE CULTURE: Culture: >=100000 — AB

## 2023-01-30 ENCOUNTER — Telehealth (HOSPITAL_COMMUNITY): Payer: Self-pay | Admitting: Emergency Medicine

## 2023-01-30 MED ORDER — SULFAMETHOXAZOLE-TRIMETHOPRIM 400-80 MG PO TABS: 1.0000 | ORAL_TABLET | Freq: Two times a day (BID) | ORAL | 0 refills | Status: DC

## 2023-07-16 ENCOUNTER — Encounter (HOSPITAL_COMMUNITY): Payer: Self-pay | Admitting: Emergency Medicine

## 2023-07-16 ENCOUNTER — Emergency Department (HOSPITAL_COMMUNITY): Payer: Commercial Managed Care - HMO

## 2023-07-16 ENCOUNTER — Inpatient Hospital Stay (HOSPITAL_COMMUNITY)
Admission: EM | Admit: 2023-07-16 | Discharge: 2023-08-01 | DRG: 659 | Disposition: E | Payer: Commercial Managed Care - HMO | Attending: Internal Medicine | Admitting: Internal Medicine

## 2023-07-16 ENCOUNTER — Inpatient Hospital Stay (HOSPITAL_COMMUNITY): Payer: Commercial Managed Care - HMO

## 2023-07-16 ENCOUNTER — Other Ambulatory Visit: Payer: Self-pay

## 2023-07-16 DIAGNOSIS — N179 Acute kidney failure, unspecified: Secondary | ICD-10-CM | POA: Diagnosis present

## 2023-07-16 DIAGNOSIS — F1110 Opioid abuse, uncomplicated: Secondary | ICD-10-CM | POA: Diagnosis present

## 2023-07-16 DIAGNOSIS — E874 Mixed disorder of acid-base balance: Secondary | ICD-10-CM | POA: Diagnosis present

## 2023-07-16 DIAGNOSIS — Z923 Personal history of irradiation: Secondary | ICD-10-CM

## 2023-07-16 DIAGNOSIS — G9341 Metabolic encephalopathy: Secondary | ICD-10-CM | POA: Diagnosis present

## 2023-07-16 DIAGNOSIS — F17210 Nicotine dependence, cigarettes, uncomplicated: Secondary | ICD-10-CM | POA: Diagnosis present

## 2023-07-16 DIAGNOSIS — R319 Hematuria, unspecified: Secondary | ICD-10-CM | POA: Diagnosis present

## 2023-07-16 DIAGNOSIS — N184 Chronic kidney disease, stage 4 (severe): Secondary | ICD-10-CM | POA: Diagnosis present

## 2023-07-16 DIAGNOSIS — D631 Anemia in chronic kidney disease: Secondary | ICD-10-CM | POA: Diagnosis present

## 2023-07-16 DIAGNOSIS — J9601 Acute respiratory failure with hypoxia: Secondary | ICD-10-CM | POA: Diagnosis present

## 2023-07-16 DIAGNOSIS — A400 Sepsis due to streptococcus, group A: Secondary | ICD-10-CM | POA: Diagnosis present

## 2023-07-16 DIAGNOSIS — T83592A Infection and inflammatory reaction due to indwelling ureteral stent, initial encounter: Secondary | ICD-10-CM | POA: Diagnosis present

## 2023-07-16 DIAGNOSIS — L03115 Cellulitis of right lower limb: Secondary | ICD-10-CM | POA: Diagnosis present

## 2023-07-16 DIAGNOSIS — R4182 Altered mental status, unspecified: Principal | ICD-10-CM

## 2023-07-16 DIAGNOSIS — E1122 Type 2 diabetes mellitus with diabetic chronic kidney disease: Secondary | ICD-10-CM | POA: Diagnosis present

## 2023-07-16 DIAGNOSIS — F17290 Nicotine dependence, other tobacco product, uncomplicated: Secondary | ICD-10-CM | POA: Diagnosis present

## 2023-07-16 DIAGNOSIS — Z1152 Encounter for screening for COVID-19: Secondary | ICD-10-CM

## 2023-07-16 DIAGNOSIS — N138 Other obstructive and reflux uropathy: Secondary | ICD-10-CM | POA: Diagnosis present

## 2023-07-16 DIAGNOSIS — R64 Cachexia: Secondary | ICD-10-CM | POA: Diagnosis present

## 2023-07-16 DIAGNOSIS — E43 Unspecified severe protein-calorie malnutrition: Secondary | ICD-10-CM | POA: Diagnosis present

## 2023-07-16 DIAGNOSIS — E875 Hyperkalemia: Secondary | ICD-10-CM | POA: Diagnosis present

## 2023-07-16 DIAGNOSIS — F1410 Cocaine abuse, uncomplicated: Secondary | ICD-10-CM | POA: Diagnosis present

## 2023-07-16 DIAGNOSIS — J9600 Acute respiratory failure, unspecified whether with hypoxia or hypercapnia: Secondary | ICD-10-CM | POA: Diagnosis not present

## 2023-07-16 DIAGNOSIS — N136 Pyonephrosis: Secondary | ICD-10-CM | POA: Diagnosis present

## 2023-07-16 DIAGNOSIS — J984 Other disorders of lung: Secondary | ICD-10-CM | POA: Diagnosis present

## 2023-07-16 DIAGNOSIS — I776 Arteritis, unspecified: Secondary | ICD-10-CM | POA: Diagnosis not present

## 2023-07-16 DIAGNOSIS — B950 Streptococcus, group A, as the cause of diseases classified elsewhere: Secondary | ICD-10-CM | POA: Diagnosis not present

## 2023-07-16 DIAGNOSIS — E11649 Type 2 diabetes mellitus with hypoglycemia without coma: Secondary | ICD-10-CM | POA: Diagnosis present

## 2023-07-16 DIAGNOSIS — N390 Urinary tract infection, site not specified: Secondary | ICD-10-CM | POA: Diagnosis not present

## 2023-07-16 DIAGNOSIS — Y831 Surgical operation with implant of artificial internal device as the cause of abnormal reaction of the patient, or of later complication, without mention of misadventure at the time of the procedure: Secondary | ICD-10-CM | POA: Diagnosis present

## 2023-07-16 DIAGNOSIS — H5702 Anisocoria: Secondary | ICD-10-CM | POA: Diagnosis present

## 2023-07-16 DIAGNOSIS — L03116 Cellulitis of left lower limb: Secondary | ICD-10-CM | POA: Diagnosis present

## 2023-07-16 DIAGNOSIS — J1289 Other viral pneumonia: Secondary | ICD-10-CM | POA: Diagnosis present

## 2023-07-16 DIAGNOSIS — Z881 Allergy status to other antibiotic agents status: Secondary | ICD-10-CM

## 2023-07-16 DIAGNOSIS — R5381 Other malaise: Secondary | ICD-10-CM | POA: Diagnosis present

## 2023-07-16 DIAGNOSIS — E1165 Type 2 diabetes mellitus with hyperglycemia: Secondary | ICD-10-CM | POA: Diagnosis present

## 2023-07-16 DIAGNOSIS — Z9221 Personal history of antineoplastic chemotherapy: Secondary | ICD-10-CM

## 2023-07-16 DIAGNOSIS — B9789 Other viral agents as the cause of diseases classified elsewhere: Secondary | ICD-10-CM | POA: Diagnosis present

## 2023-07-16 DIAGNOSIS — R6521 Severe sepsis with septic shock: Secondary | ICD-10-CM | POA: Diagnosis present

## 2023-07-16 DIAGNOSIS — Z91199 Patient's noncompliance with other medical treatment and regimen due to unspecified reason: Secondary | ICD-10-CM

## 2023-07-16 DIAGNOSIS — J9691 Respiratory failure, unspecified with hypoxia: Secondary | ICD-10-CM | POA: Diagnosis not present

## 2023-07-16 DIAGNOSIS — Z8541 Personal history of malignant neoplasm of cervix uteri: Secondary | ICD-10-CM

## 2023-07-16 DIAGNOSIS — R7881 Bacteremia: Secondary | ICD-10-CM | POA: Diagnosis not present

## 2023-07-16 DIAGNOSIS — Z79899 Other long term (current) drug therapy: Secondary | ICD-10-CM

## 2023-07-16 DIAGNOSIS — B1920 Unspecified viral hepatitis C without hepatic coma: Secondary | ICD-10-CM | POA: Diagnosis present

## 2023-07-16 DIAGNOSIS — Z515 Encounter for palliative care: Secondary | ICD-10-CM | POA: Diagnosis not present

## 2023-07-16 DIAGNOSIS — D72823 Leukemoid reaction: Secondary | ICD-10-CM | POA: Diagnosis present

## 2023-07-16 DIAGNOSIS — J15212 Pneumonia due to Methicillin resistant Staphylococcus aureus: Secondary | ICD-10-CM | POA: Diagnosis present

## 2023-07-16 DIAGNOSIS — R34 Anuria and oliguria: Secondary | ICD-10-CM | POA: Diagnosis present

## 2023-07-16 DIAGNOSIS — R7989 Other specified abnormal findings of blood chemistry: Secondary | ICD-10-CM | POA: Diagnosis present

## 2023-07-16 DIAGNOSIS — Z888 Allergy status to other drugs, medicaments and biological substances status: Secondary | ICD-10-CM

## 2023-07-16 DIAGNOSIS — A419 Sepsis, unspecified organism: Secondary | ICD-10-CM | POA: Diagnosis present

## 2023-07-16 DIAGNOSIS — G43909 Migraine, unspecified, not intractable, without status migrainosus: Secondary | ICD-10-CM | POA: Diagnosis present

## 2023-07-16 DIAGNOSIS — Z66 Do not resuscitate: Secondary | ICD-10-CM | POA: Diagnosis not present

## 2023-07-16 DIAGNOSIS — J982 Interstitial emphysema: Secondary | ICD-10-CM | POA: Diagnosis present

## 2023-07-16 DIAGNOSIS — Z6823 Body mass index (BMI) 23.0-23.9, adult: Secondary | ICD-10-CM

## 2023-07-16 LAB — CBC WITH DIFFERENTIAL/PLATELET
Abs Immature Granulocytes: 0 10*3/uL (ref 0.00–0.07)
Basophils Absolute: 0 10*3/uL (ref 0.0–0.1)
Basophils Relative: 0 %
Eosinophils Absolute: 0 10*3/uL (ref 0.0–0.5)
Eosinophils Relative: 0 %
HCT: 36 % (ref 36.0–46.0)
Hemoglobin: 10.8 g/dL — ABNORMAL LOW (ref 12.0–15.0)
Lymphocytes Relative: 5 %
Lymphs Abs: 2.3 10*3/uL (ref 0.7–4.0)
MCH: 28.6 pg (ref 26.0–34.0)
MCHC: 30 g/dL (ref 30.0–36.0)
MCV: 95.2 fL (ref 80.0–100.0)
Monocytes Absolute: 1.8 10*3/uL — ABNORMAL HIGH (ref 0.1–1.0)
Monocytes Relative: 4 %
Neutro Abs: 42 10*3/uL — ABNORMAL HIGH (ref 1.7–7.7)
Neutrophils Relative %: 91 %
Platelets: 704 10*3/uL — ABNORMAL HIGH (ref 150–400)
RBC: 3.78 MIL/uL — ABNORMAL LOW (ref 3.87–5.11)
RDW: 15 % (ref 11.5–15.5)
WBC: 46.1 10*3/uL — ABNORMAL HIGH (ref 4.0–10.5)
nRBC: 0 /100{WBCs}
nRBC: 0.1 % (ref 0.0–0.2)

## 2023-07-16 LAB — I-STAT ARTERIAL BLOOD GAS, ED
Acid-base deficit: 21 mmol/L — ABNORMAL HIGH (ref 0.0–2.0)
Bicarbonate: 10.7 mmol/L — ABNORMAL LOW (ref 20.0–28.0)
Calcium, Ion: 1.15 mmol/L (ref 1.15–1.40)
HCT: 26 % — ABNORMAL LOW (ref 36.0–46.0)
Hemoglobin: 8.8 g/dL — ABNORMAL LOW (ref 12.0–15.0)
O2 Saturation: 100 %
Patient temperature: 96.7
Potassium: 5.5 mmol/L — ABNORMAL HIGH (ref 3.5–5.1)
Sodium: 137 mmol/L (ref 135–145)
TCO2: 12 mmol/L — ABNORMAL LOW (ref 22–32)
pCO2 arterial: 49.6 mm[Hg] — ABNORMAL HIGH (ref 32–48)
pH, Arterial: 6.935 — CL (ref 7.35–7.45)
pO2, Arterial: 497 mm[Hg] — ABNORMAL HIGH (ref 83–108)

## 2023-07-16 LAB — COMPREHENSIVE METABOLIC PANEL
ALT: 15 U/L (ref 0–44)
AST: 24 U/L (ref 15–41)
Albumin: 2.7 g/dL — ABNORMAL LOW (ref 3.5–5.0)
Alkaline Phosphatase: 127 U/L — ABNORMAL HIGH (ref 38–126)
BUN: 158 mg/dL — ABNORMAL HIGH (ref 6–20)
CO2: <7 mmol/L — ABNORMAL LOW (ref 22–32)
Calcium: 10 mg/dL (ref 8.9–10.3)
Chloride: 101 mmol/L (ref 98–111)
Creatinine, Ser: 12.13 mg/dL — ABNORMAL HIGH (ref 0.44–1.00)
GFR, Estimated: 4 mL/min — ABNORMAL LOW (ref >60)
Glucose, Bld: 129 mg/dL — ABNORMAL HIGH (ref 70–99)
Potassium: 5.7 mmol/L — ABNORMAL HIGH (ref 3.5–5.1)
Sodium: 130 mmol/L — ABNORMAL LOW (ref 135–145)
Total Bilirubin: 0.8 mg/dL (ref <1.2)
Total Protein: 11.3 g/dL — ABNORMAL HIGH (ref 6.5–8.1)

## 2023-07-16 LAB — CBG MONITORING, ED
Glucose-Capillary: 98 mg/dL (ref 70–99)
Glucose-Capillary: 107 mg/dL — ABNORMAL HIGH (ref 70–99)

## 2023-07-16 LAB — I-STAT VENOUS BLOOD GAS, ED
Acid-base deficit: 25 mmol/L — ABNORMAL HIGH (ref 0.0–2.0)
Bicarbonate: 5.4 mmol/L — ABNORMAL LOW (ref 20.0–28.0)
Calcium, Ion: 1.14 mmol/L — ABNORMAL LOW (ref 1.15–1.40)
HCT: 40 % (ref 36.0–46.0)
Hemoglobin: 13.6 g/dL (ref 12.0–15.0)
O2 Saturation: 43 %
Potassium: 5.9 mmol/L — ABNORMAL HIGH (ref 3.5–5.1)
Sodium: 133 mmol/L — ABNORMAL LOW (ref 135–145)
TCO2: 6 mmol/L — ABNORMAL LOW (ref 22–32)
pCO2, Ven: 23.4 mm[Hg] — ABNORMAL LOW (ref 44–60)
pH, Ven: 6.974 — CL (ref 7.25–7.43)
pO2, Ven: 36 mm[Hg] (ref 32–45)

## 2023-07-16 LAB — PROCALCITONIN: Procalcitonin: 29.14 ng/mL

## 2023-07-16 LAB — ETHANOL: Alcohol, Ethyl (B): <10 mg/dL (ref <10)

## 2023-07-16 LAB — OSMOLALITY: Osmolality: 355 mosm/kg (ref 275–295)

## 2023-07-16 LAB — AMMONIA: Ammonia: 79 umol/L — ABNORMAL HIGH (ref 9–35)

## 2023-07-16 LAB — RESP PANEL BY RT-PCR (RSV, FLU A&B, COVID)  RVPGX2
Influenza A by PCR: NEGATIVE
Influenza B by PCR: NEGATIVE
Resp Syncytial Virus by PCR: NEGATIVE
SARS Coronavirus 2 by RT PCR: NEGATIVE

## 2023-07-16 LAB — LIPASE, BLOOD: Lipase: 133 U/L — ABNORMAL HIGH (ref 11–51)

## 2023-07-16 LAB — BASIC METABOLIC PANEL
Anion gap: 15 (ref 5–15)
BUN: 150 mg/dL — ABNORMAL HIGH (ref 6–20)
CO2: 10 mmol/L — ABNORMAL LOW (ref 22–32)
Calcium: 8.3 mg/dL — ABNORMAL LOW (ref 8.9–10.3)
Chloride: 109 mmol/L (ref 98–111)
Creatinine, Ser: 11.27 mg/dL — ABNORMAL HIGH (ref 0.44–1.00)
GFR, Estimated: 4 mL/min — ABNORMAL LOW (ref >60)
Glucose, Bld: 120 mg/dL — ABNORMAL HIGH (ref 70–99)
Potassium: 6 mmol/L — ABNORMAL HIGH (ref 3.5–5.1)
Sodium: 134 mmol/L — ABNORMAL LOW (ref 135–145)

## 2023-07-16 LAB — SALICYLATE LEVEL: Salicylate Lvl: <7 mg/dL — ABNORMAL LOW (ref 7.0–30.0)

## 2023-07-16 LAB — I-STAT CG4 LACTIC ACID, ED
Lactic Acid, Venous: 6.1 mmol/L (ref 0.5–1.9)
Lactic Acid, Venous: 0.8 mmol/L (ref 0.5–1.9)

## 2023-07-16 LAB — GLUCOSE, CAPILLARY
Glucose-Capillary: 108 mg/dL — ABNORMAL HIGH (ref 70–99)
Glucose-Capillary: 63 mg/dL — ABNORMAL LOW (ref 70–99)

## 2023-07-16 LAB — HCG, QUANTITATIVE, PREGNANCY: hCG, Beta Chain, Quant, S: 1 m[IU]/mL (ref <5)

## 2023-07-16 LAB — MRSA NEXT GEN BY PCR, NASAL: MRSA by PCR Next Gen: DETECTED — AB

## 2023-07-16 LAB — TSH: TSH: 4.046 u[IU]/mL (ref 0.350–4.500)

## 2023-07-16 LAB — ACETAMINOPHEN LEVEL: Acetaminophen (Tylenol), Serum: <10 ug/mL — ABNORMAL LOW (ref 10–30)

## 2023-07-16 LAB — CK: Total CK: 198 U/L (ref 38–234)

## 2023-07-16 MED ORDER — HEPARIN SODIUM (PORCINE) 1000 UNIT/ML DIALYSIS
1000.0000 [IU] | INTRAMUSCULAR | Status: DC | PRN
  Administered 2023-07-17: 2400 [IU] via INTRAVENOUS_CENTRAL
  Administered 2023-07-18: 1200 [IU] via INTRAVENOUS_CENTRAL
  Administered 2023-07-20: 2400 [IU] via INTRAVENOUS_CENTRAL
  Administered 2023-07-24: 4000 [IU] via INTRAVENOUS_CENTRAL
  Filled 2023-07-16: qty 4
  Filled 2023-07-16 (×3): qty 6
  Filled 2023-07-16: qty 2
  Filled 2023-07-16: qty 3
  Filled 2023-07-16: qty 6
  Filled 2023-07-16: qty 1
  Filled 2023-07-16: qty 6

## 2023-07-16 MED ORDER — HALOPERIDOL LACTATE 5 MG/ML IJ SOLN
2.0000 mg | Freq: Once | INTRAMUSCULAR | Status: AC
  Administered 2023-07-16: 2 mg via INTRAVENOUS
  Filled 2023-07-16: qty 1

## 2023-07-16 MED ORDER — ETOMIDATE 2 MG/ML IV SOLN
INTRAVENOUS | Status: AC | PRN
  Administered 2023-07-16: 10 mg via INTRAVENOUS

## 2023-07-16 MED ORDER — ROCURONIUM BROMIDE 10 MG/ML (PF) SYRINGE
PREFILLED_SYRINGE | INTRAVENOUS | Status: AC | PRN
  Administered 2023-07-16: 50 mg via INTRAVENOUS

## 2023-07-16 MED ORDER — SODIUM CHLORIDE 0.9 % IV SOLN
2.0000 g | Freq: Once | INTRAVENOUS | Status: AC
  Administered 2023-07-16: 2 g via INTRAVENOUS
  Filled 2023-07-16: qty 12.5

## 2023-07-16 MED ORDER — FENTANYL BOLUS VIA INFUSION
50.0000 ug | INTRAVENOUS | Status: DC | PRN
  Administered 2023-07-16: 50 ug via INTRAVENOUS
  Administered 2023-07-16: 100 ug via INTRAVENOUS
  Administered 2023-07-17 – 2023-07-19 (×9): 50 ug via INTRAVENOUS
  Administered 2023-07-19: 100 ug via INTRAVENOUS
  Administered 2023-07-19 (×4): 50 ug via INTRAVENOUS
  Administered 2023-07-20: 100 ug via INTRAVENOUS
  Administered 2023-07-20 – 2023-07-22 (×8): 50 ug via INTRAVENOUS
  Administered 2023-07-23 – 2023-07-27 (×11): 100 ug via INTRAVENOUS

## 2023-07-16 MED ORDER — PROPOFOL 1000 MG/100ML IV EMUL
0.0000 ug/kg/min | INTRAVENOUS | Status: DC
Start: 2023-07-16 — End: 2023-07-24
  Administered 2023-07-16: 5 ug/kg/min via INTRAVENOUS
  Administered 2023-07-17: 35 ug/kg/min via INTRAVENOUS
  Administered 2023-07-17: 25 ug/kg/min via INTRAVENOUS
  Administered 2023-07-17 – 2023-07-18 (×4): 35 ug/kg/min via INTRAVENOUS
  Administered 2023-07-19: 25 ug/kg/min via INTRAVENOUS
  Administered 2023-07-19: 45 ug/kg/min via INTRAVENOUS
  Administered 2023-07-19: 35 ug/kg/min via INTRAVENOUS
  Administered 2023-07-19: 25 ug/kg/min via INTRAVENOUS
  Administered 2023-07-20 (×2): 40 ug/kg/min via INTRAVENOUS
  Administered 2023-07-20: 50 ug/kg/min via INTRAVENOUS
  Administered 2023-07-20 (×2): 40 ug/kg/min via INTRAVENOUS
  Administered 2023-07-21 (×2): 50 ug/kg/min via INTRAVENOUS
  Administered 2023-07-21 – 2023-07-22 (×3): 40 ug/kg/min via INTRAVENOUS
  Administered 2023-07-22 (×2): 30 ug/kg/min via INTRAVENOUS
  Administered 2023-07-22: 40 ug/kg/min via INTRAVENOUS
  Administered 2023-07-23 (×3): 30 ug/kg/min via INTRAVENOUS
  Filled 2023-07-16 (×2): qty 100
  Filled 2023-07-16: qty 200
  Filled 2023-07-16 (×10): qty 100
  Filled 2023-07-16: qty 1000
  Filled 2023-07-16 (×5): qty 100
  Filled 2023-07-16 (×2): qty 200
  Filled 2023-07-16 (×4): qty 100

## 2023-07-16 MED ORDER — MIDAZOLAM HCL 2 MG/2ML IJ SOLN: 1.0000 mg | INTRAMUSCULAR | Status: DC | PRN

## 2023-07-16 MED ORDER — SODIUM BICARBONATE 8.4 % IV SOLN
100.0000 meq | Freq: Once | INTRAVENOUS | Status: AC
  Administered 2023-07-16: 100 meq via INTRAVENOUS
  Filled 2023-07-16: qty 100

## 2023-07-16 MED ORDER — POLYETHYLENE GLYCOL 3350 17 G PO PACK
17.0000 g | PACK | Freq: Every day | ORAL | Status: DC
  Administered 2023-07-17 – 2023-07-18 (×2): 17 g
  Filled 2023-07-16 (×2): qty 1

## 2023-07-16 MED ORDER — HEPARIN (PORCINE) 2000 UNITS/L FOR CRRT
INTRAVENOUS_CENTRAL | Status: DC | PRN
  Administered 2023-07-17: 2000 mL via INTRAVENOUS_CENTRAL

## 2023-07-16 MED ORDER — SODIUM CHLORIDE 0.9 % IV SOLN: 1.0000 g | INTRAVENOUS | Status: DC

## 2023-07-16 MED ORDER — INSULIN ASPART 100 UNIT/ML IJ SOLN
0.0000 [IU] | INTRAMUSCULAR | Status: DC
  Administered 2023-07-17: 2 [IU] via SUBCUTANEOUS
  Administered 2023-07-18 – 2023-07-21 (×17): 1 [IU] via SUBCUTANEOUS
  Administered 2023-07-22: 2 [IU] via SUBCUTANEOUS
  Administered 2023-07-22 – 2023-07-25 (×13): 1 [IU] via SUBCUTANEOUS

## 2023-07-16 MED ORDER — DEXTROSE 50 % IV SOLN: 12.5000 g | INTRAVENOUS | Status: AC

## 2023-07-16 MED ORDER — HEPARIN SODIUM (PORCINE) 5000 UNIT/ML IJ SOLN
5000.0000 [IU] | Freq: Three times a day (TID) | INTRAMUSCULAR | Status: DC
  Administered 2023-07-17 – 2023-07-20 (×10): 5000 [IU] via SUBCUTANEOUS
  Filled 2023-07-16 (×10): qty 1

## 2023-07-16 MED ORDER — DEXTROSE 50 % IV SOLN
INTRAVENOUS | Status: AC
  Administered 2023-07-17: 12.5 g via INTRAVENOUS
  Filled 2023-07-16: qty 50

## 2023-07-16 MED ORDER — FENTANYL CITRATE PF 50 MCG/ML IJ SOSY
50.0000 ug | PREFILLED_SYRINGE | Freq: Once | INTRAMUSCULAR | Status: AC
  Administered 2023-07-16: 50 ug via INTRAVENOUS
  Filled 2023-07-16: qty 1

## 2023-07-16 MED ORDER — STERILE WATER FOR INJECTION IV SOLN
INTRAVENOUS | Status: DC
  Filled 2023-07-16 (×2): qty 150

## 2023-07-16 MED ORDER — NOREPINEPHRINE 4 MG/250ML-% IV SOLN
0.0000 ug/min | INTRAVENOUS | Status: DC
  Administered 2023-07-16: 6 ug/min via INTRAVENOUS
  Administered 2023-07-17: 2 ug/min via INTRAVENOUS
  Administered 2023-07-18: 3 ug/min via INTRAVENOUS
  Administered 2023-07-19 – 2023-07-21 (×2): 2 ug/min via INTRAVENOUS
  Administered 2023-07-22: 4 ug/min via INTRAVENOUS
  Administered 2023-07-23: 8 ug/min via INTRAVENOUS
  Administered 2023-07-23: 5 ug/min via INTRAVENOUS
  Filled 2023-07-16 (×6): qty 250

## 2023-07-16 MED ORDER — ORAL CARE MOUTH RINSE
15.0000 mL | OROMUCOSAL | Status: DC
  Administered 2023-07-16 – 2023-07-27 (×124): 15 mL via OROMUCOSAL

## 2023-07-16 MED ORDER — DOCUSATE SODIUM 50 MG/5ML PO LIQD
100.0000 mg | Freq: Two times a day (BID) | ORAL | Status: DC
  Administered 2023-07-17 – 2023-07-18 (×4): 100 mg
  Filled 2023-07-16 (×4): qty 10

## 2023-07-16 MED ORDER — SODIUM CHLORIDE 0.9 % IV BOLUS
2000.0000 mL | Freq: Once | INTRAVENOUS | Status: AC
  Administered 2023-07-16: 2000 mL via INTRAVENOUS

## 2023-07-16 MED ORDER — HEPARIN SODIUM (PORCINE) 5000 UNIT/ML IJ SOLN
350.0000 [IU]/h | INTRAMUSCULAR | Status: DC
  Administered 2023-07-17: 350 [IU]/h via INTRAVENOUS_CENTRAL
  Administered 2023-07-17 – 2023-07-18 (×2): 550 [IU]/h via INTRAVENOUS_CENTRAL
  Administered 2023-07-19 – 2023-07-20 (×3): 750 [IU]/h via INTRAVENOUS_CENTRAL
  Administered 2023-07-21 – 2023-07-23 (×4): 350 [IU]/h via INTRAVENOUS_CENTRAL
  Filled 2023-07-16: qty 2
  Filled 2023-07-16: qty 10000
  Filled 2023-07-16 (×6): qty 2
  Filled 2023-07-16 (×3): qty 10000

## 2023-07-16 MED ORDER — STERILE WATER FOR INJECTION IV SOLN
INTRAVENOUS | Status: DC
  Filled 2023-07-16: qty 1000

## 2023-07-16 MED ORDER — ROCURONIUM BROMIDE 10 MG/ML (PF) SYRINGE: 1.0000 mg/kg | PREFILLED_SYRINGE | Freq: Once | INTRAVENOUS | Status: DC

## 2023-07-16 MED ORDER — FENTANYL 2500MCG IN NS 250ML (10MCG/ML) PREMIX INFUSION
50.0000 ug/h | INTRAVENOUS | Status: DC
  Administered 2023-07-16: 50 ug/h via INTRAVENOUS
  Administered 2023-07-17 – 2023-07-19 (×5): 200 ug/h via INTRAVENOUS
  Administered 2023-07-20: 175 ug/h via INTRAVENOUS
  Administered 2023-07-21 – 2023-07-22 (×4): 200 ug/h via INTRAVENOUS
  Administered 2023-07-23: 125 ug/h via INTRAVENOUS
  Administered 2023-07-23: 200 ug/h via INTRAVENOUS
  Administered 2023-07-24: 125 ug/h via INTRAVENOUS
  Administered 2023-07-25: 200 ug/h via INTRAVENOUS
  Administered 2023-07-25: 125 ug/h via INTRAVENOUS
  Administered 2023-07-26 – 2023-07-27 (×4): 200 ug/h via INTRAVENOUS
  Filled 2023-07-16 (×21): qty 250

## 2023-07-16 MED ORDER — MUPIROCIN 2 % EX OINT
1.0000 | TOPICAL_OINTMENT | Freq: Two times a day (BID) | CUTANEOUS | Status: AC
  Administered 2023-07-17 – 2023-07-21 (×10): 1 via NASAL
  Filled 2023-07-16: qty 22

## 2023-07-16 MED ORDER — LORAZEPAM 2 MG/ML IJ SOLN
2.0000 mg | Freq: Once | INTRAMUSCULAR | Status: AC
  Administered 2023-07-16: 2 mg via INTRAVENOUS
  Filled 2023-07-16: qty 1

## 2023-07-16 MED ORDER — CHLORHEXIDINE GLUCONATE CLOTH 2 % EX PADS
6.0000 | MEDICATED_PAD | Freq: Every day | CUTANEOUS | Status: AC
  Administered 2023-07-18 – 2023-07-19 (×2): 6 via TOPICAL

## 2023-07-16 MED ORDER — MIDAZOLAM HCL 2 MG/2ML IJ SOLN
1.0000 mg | INTRAMUSCULAR | Status: DC | PRN
Start: 2023-07-16 — End: 2023-07-27
  Administered 2023-07-19 – 2023-07-23 (×4): 2 mg via INTRAVENOUS
  Filled 2023-07-16 (×6): qty 2

## 2023-07-16 MED ORDER — VANCOMYCIN HCL 1.5 G IV SOLR
1500.0000 mg | Freq: Once | INTRAVENOUS | Status: AC
  Administered 2023-07-16: 1500 mg via INTRAVENOUS
  Filled 2023-07-16: qty 30

## 2023-07-16 MED ORDER — ORAL CARE MOUTH RINSE: 15.0000 mL | OROMUCOSAL | Status: DC | PRN

## 2023-07-16 MED ORDER — SODIUM BICARBONATE 8.4 % IV SOLN
INTRAVENOUS | Status: AC
  Filled 2023-07-16: qty 50

## 2023-07-16 MED ORDER — CHLORHEXIDINE GLUCONATE CLOTH 2 % EX PADS
6.0000 | MEDICATED_PAD | Freq: Every day | CUTANEOUS | Status: DC
  Administered 2023-07-16 – 2023-07-26 (×10): 6 via TOPICAL

## 2023-07-16 MED ORDER — ETOMIDATE 2 MG/ML IV SOLN: 0.3000 mg/kg | Freq: Once | INTRAVENOUS | Status: DC

## 2023-07-16 MED ORDER — PANTOPRAZOLE SODIUM 40 MG IV SOLR
40.0000 mg | Freq: Every day | INTRAVENOUS | Status: DC
  Administered 2023-07-16 – 2023-07-24 (×9): 40 mg via INTRAVENOUS
  Filled 2023-07-16 (×9): qty 10

## 2023-07-16 MED ORDER — DIPHENHYDRAMINE HCL 50 MG/ML IJ SOLN
25.0000 mg | Freq: Once | INTRAMUSCULAR | Status: AC
Start: 2023-07-16 — End: 2023-07-16
  Administered 2023-07-16: 25 mg via INTRAVENOUS
  Filled 2023-07-16: qty 1

## 2023-07-16 MED ORDER — PRISMASOL BGK 0/2.5 32-2.5 MEQ/L EC SOLN
Status: DC
  Filled 2023-07-16 (×7): qty 5000

## 2023-07-16 NOTE — Sepsis Progress Note (Signed)
Asking RN to check on lactic acid

## 2023-07-16 NOTE — Sepsis Progress Note (Signed)
Elink monitoring for the code sepsis protocol.  

## 2023-07-16 NOTE — Sepsis Progress Note (Signed)
Notified bedside nurse of need to draw lactic acid.  

## 2023-07-16 NOTE — H&P (Signed)
NAME:  Marie Brewer, MRN:  [id number], DOB:  [birth year], LOS: 0 ADMISSION DATE:  07/16/2023 CONSULTATION DATE:  07/16/2023 REFERRING MD:  Doran Durand - EDP CHIEF COMPLAINT:  Sepsis, presumed urosepsis   History of Present Illness:  42 year old woman who presented to [facility] ED 12/16 for unresponsiveness. 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  Patient presented to [facility] ED with decreased responsiveness x 3 days, per patient's husband (at bedside). Has not been feeling well for about a month but is often hesitant to come to the ED and was adamantly refusing going to the hospital. PO intake has been poor, has been only drinking [facility] and not eating much. No reported fevers at home, but chills/general malaise. Intermittent nausea, no vomiting/diarrhea. Per husband, has complained of abdominal pain/pain with urination. Of note, has bilateral ureteral stents placed in 10/2022; she was supposed to follow up for stent exchange 01/2023 but did not present as scheduled. Additionally, husband notes she continues to use heroin via inhalation, denies injection.  On ED arrival, patient was afebrile, HR 111, BP 126/80, SpO2 100%. She was initially somnolent with GCS 12 but intermittently agitated/not following command, thrashing and causing risk to herself/staff. Ultimately patient was intubated for her safety/airway protection. Labs were notable for WBC 46.1, Hgb 10.8, Plt 704 (suspect concentrated), Na 130, K 5.7, CO2 < 7, BUN 158/Cr 12.13 (baseline ~4). Transaminases/Tbili WNL, Alk Phos mildly elevated. Lipase 133. TSH 4.046. LA 6.1 > 0.8 after IV fluids. CK 198. Ammonia 79, Ethanol < 10, APAP/salicylates negative. UDS pending. VBG 6.974/pCO2 23.4/bicarb 5.4. PCT 29.14, MRSA PCR positive (nares). BCx pending, cefepime/vanc initiated. CT  Chest/A/P demonstrated bilateral centrilobular micronodules (tree-in-bud) greatest in bilateral lower lobes, small amount pneumomediastinum (history of esophageal perf), thick-walled nondistended urinary bladder with Foley, bilateral hydronephrosis with bilateral ureteral stents.  PCCM consulted for ICU admission. Nephro consulted for ARF and emergent CRRT initiation.  Pertinent Medical History:   Past Medical History:  Diagnosis Date   Anemia    Bilateral ureteral obstruction    Cancer (HCC)    Chronic kidney disease    History of blood transfusion 08/30/2021   1 unit   History of cancer chemotherapy    completed 06/ 2011 for cervical cancer   History of cervical cancer 08/2009   followed by [facility] center;  dx SCC Stage IIB,  completed chemoradiation 06/ 2011   History of radiation therapy    completed 06/ 2011 ffor cervical cancer   Migraine headache    Polysubstance abuse (HCC) 05/05/2020   heroin   Significant Hospital Events: Including procedures, antibiotic start and stop dates in addition to other pertinent events   12/16 - Presented to [facility] ED with unresponsiveness, decreased PO intake. Unresponsiveness with intermittent agitation requiring intubation. Found to be in acute renal failure with Cr 12 (baseline ~4), BUN > 100. WBC 46. Briefly required Levophed prior to intubation. Broad-spectrum antibiotic started (cefepime/vanc). RIJ Trialysis catheter placed. CRRT started per Nephro.  Interim History / Subjective:  PCCM consulted for ICU admission. Husband, Mellody Dance, at bedside.  Objective:  Blood pressure (!) 143/80, pulse (!) 108, temperature (!) 96.7 F (35.9 C), temperature source Rectal, resp. rate (!) 21, height 5\' 6"  (1.676 m), weight 70.3 kg, SpO2 100%.    Vent Mode: PRVC FiO2 (%):  [100 %] 100 % Set Rate:  [20 bmp] 20 bmp Vt Set:  [  420 mL] 420 mL PEEP:  [5 cmH20] 5 cmH20 Plateau Pressure:  [14 cmH20] 14 cmH20  No intake or output data in the 24 hours ending  07/16/23 2044 Filed Weights   07/16/23 1643  Weight: 70.3 kg   Physical Examination: General: Acute-on-chronically ill-appearing middle-aged woman in NAD. HEENT: [place]/AT, anicteric sclera, PERRL 4mm sluggish, dry mucous membranes. Neck: RIJ Trialysis catheter in place. Neuro:  Sedated, unresponsive.  Does not respond to verbal, tactile or noxious stimuli. Not following commands. No spontaneous movement of extremities noted. Weak +Cough and +Gag. CV: Tachycardic, regular rhythm, no m/g/r. PULM: Breathing even and unlabored on vent (PEEP 5, FiO2 40%). Lung fields CTAB. GI: Soft, nontender, nondistended. Normoactive bowel sounds. Extremities: Chronic 1+ symmetric LE edema noted with chronic-appearing erythema and chronic wounds in various stages of healing, some areas of ulceration (see Media tab). Skin: Warm/dry, BLE as above.  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 Liliane Shi - Urology). Unfortunately, has history of poor compliance/follow up with Urology and has had retained stents in the past for > 12 months. - Admit to ICU - Goal MAP > 65 - Fluid resuscitation as tolerated, bicarb-containing fluid replacement per Nephro - Peripheral Levophed titrated to goal MAP, titrated off currently - Trend WBC, fever curve; LA normalized - F/u Cx data - Continue broad-spectrum antibiotics (cefepime) - Urology consult 12/17AM - Needs stent removal/replacement once more stable - Maintain Foley  Acute renal failure CKD stage 4 Severe metabolic acidosis Hyperkalemia - Nephro consulted, appreciate assistance - CRRT initiation tonight, 12/16PM; RIJ Trialysis placed for access - Trend BMP - Replete electrolytes as  indicated - Monitor I&Os via Foley - F/u urine studies - Avoid nephrotoxic agents as able - Ensure adequate renal perfusion  Acute hypoxemic respiratory failure Concern for aspiration pneumonitis - Continue full vent support (4-8cc/kg IBW) - Wean FiO2 for O2 sat > 90% - Daily WUA/SBT once appropriate from a mental status standpoint - VAP bundle - Pulmonary hygiene - PAD protocol for sedation: Propofol and Fentanyl for goal RASS 0 to -1 - Follow ABG - Broad-spectrum antibiotics as above  Small volume pneumomediastinum ?Boerhaave's, has history of esophageal perforation. - Monitor closely for now  History of cervical CA Stage IIB, s/p chemo and radiation, followed at [facility]. - Outpatient Oncology f/u  History of polysubstance abuse with current heroin use - F/u UDS - Encourage cessation, TOC consult for resources once able to participate in care  Best Practice: (right click and "Reselect all SmartList Selections" daily)   Diet/type: NPO DVT prophylaxis: SCDs, SQH GI prophylaxis: PPI Lines: N/A Foley:  Yes, and it is still needed Code Status:  full code Last date of multidisciplinary goals of care discussion [Pending]  Labs:  CBC: Recent Labs  Lab 07/16/23 1652 07/16/23 1658 07/16/23 2039  WBC 46.1*  --   --   NEUTROABS 42.0*  --   --   HGB 10.8* 13.6 8.8*  HCT 36.0 40.0 26.0*  MCV 95.2  --   --   PLT 704*  --   --    Basic Metabolic Panel: Recent Labs  Lab 07/16/23 1652 07/16/23 1658 07/16/23 2039  NA 130* 133* 137  K 5.7* 5.9* 5.5*  CL 101  --   --  CO2 <7*  --   --   GLUCOSE 129*  --   --   BUN 158*  --   --   CREATININE 12.13*  --   --   CALCIUM 10.0  --   --    GFR: Estimated Creatinine Clearance: 5.6 mL/min (A) (by C-G formula based on SCr of 12.13 mg/dL (H)). Recent Labs  Lab 07/16/23 1652 07/16/23 1807 07/16/23 2007  WBC 46.1*  --   --   LATICACIDVEN  --  6.1* 0.8   Liver Function Tests: Recent Labs  Lab  07/16/23 1652  AST 24  ALT 15  ALKPHOS 127*  BILITOT 0.8  PROT 11.3*  ALBUMIN 2.7*   Recent Labs  Lab 07/16/23 1652  LIPASE 133*   Recent Labs  Lab 07/16/23 1649  AMMONIA 79*   ABG:    Component Value Date/Time   PHART 6.935 (LL) 07/16/2023 2039   PCO2ART 49.6 (H) 07/16/2023 2039   PO2ART 497 (H) 07/16/2023 2039   HCO3 10.7 (L) 07/16/2023 2039   TCO2 12 (L) 07/16/2023 2039   ACIDBASEDEF 21.0 (H) 07/16/2023 2039   O2SAT 100 07/16/2023 2039    Coagulation Profile: No results for input(s): "INR", "PROTIME" in the last 168 hours.  Cardiac Enzymes: Recent Labs  Lab 07/16/23 1652  CKTOTAL 198   HbA1C: No results found for: "HGBA1C"  CBG: Recent Labs  Lab 07/16/23 1725  GLUCAP 98   Review of Systems:   Patient is encephalopathic and/or intubated; therefore, history has been obtained from chart review.   Past Medical History:  She,  has a past medical history of Anemia, Bilateral ureteral obstruction, Cancer (HCC), Chronic kidney disease, History of blood transfusion (08/30/2021), History of cancer chemotherapy, History of cervical cancer (08/2009), History of radiation therapy, Migraine headache, and Polysubstance abuse (HCC) (05/05/2020).   Surgical History:   Past Surgical History:  Procedure Laterality Date   CHOLECYSTECTOMY     CYSTOSCOPY W/ URETERAL STENT PLACEMENT Bilateral 08/15/2020   Procedure: CYSTOSCOPY WITH RETROGRADE PYELOGRAM/URETERAL STENT PLACEMENT;  Surgeon: Rene Paci, MD;  Location: [facility] OR;  Service: Urology;  Laterality: Bilateral;   CYSTOSCOPY W/ URETERAL STENT PLACEMENT Bilateral 09/07/2021   Procedure: CYSTOSCOPY WITH RETROGRADE PYELOGRAM/URETERAL STENT EXCHANGE;  Surgeon: Rene Paci, MD;  Location: [facility];  Service: Urology;  Laterality: Bilateral;   CYSTOSCOPY W/ URETERAL STENT PLACEMENT Bilateral 04/19/2022   Procedure: CYSTOSCOPY WITH STENT EXCHANGE;  Surgeon: Rene Paci, MD;  Location: [facility];  Service: Urology;  Laterality: Bilateral;   CYSTOSCOPY WITH STENT PLACEMENT Bilateral 11/15/2022   Procedure: CYSTOSCOPY WITH BILATERAL STENT EXCHANGE;  Surgeon: Rene Paci, MD;  Location: [facility];  Service: Urology;  Laterality: Bilateral;   INCISION AND DRAINAGE ABSCESS  04/09/2003   @[facility]  ;  RIGHT SUBMANDIBULAR ABSCESS (LARGE) AND EXTRACTION MULTIPLE TEETH   IRRIGATION AND DEBRIDEMENT ABSCESS Bilateral 05/05/2020   Procedure: left forearm incision and drainage and aspiration of the right wrist joint;  Surgeon: Allena Napoleon, MD;  Location: [facility];  Service: Plastics;  Laterality: Bilateral;    Social History:   reports that she has been smoking cigarettes. She has a 2.5 pack-year smoking history. She has never used smokeless tobacco. She reports that she does not currently use drugs after having used the following drugs: Heroin. She reports that she does not drink alcohol.   Family History:  Her family history includes Autism spectrum disorder in an other family member.   Allergies: Allergies  Allergen  Reactions   Imitrex [Sumatriptan Base] Palpitations    Heart races   Cephalexin Rash    Patient passed amoxicillin challenge on 08/18/20, no adverse effect    Home Medications: Prior to Admission medications   Medication Sig Start Date End Date Taking? Authorizing Provider  ciprofloxacin (CIPRO) 500 MG tablet Take 1 tablet (500 mg total) by mouth daily. 01/26/23   Raspet, Noberto Retort, PA-C  ferrous sulfate 325 (65 FE) MG tablet Take 325 mg by mouth daily.    [provider]  Multiple Vitamin (MULTIVITAMIN WITH MINERALS) TABS tablet Take 1 tablet by mouth daily.    [provider]  oxybutynin (DITROPAN) 5 MG tablet Take 5 mg by mouth every 8 (eight) hours as needed for bladder spasms.    [provider]  sulfamethoxazole-trimethoprim (BACTRIM) 400-80 MG tablet Take 1 tablet by mouth 2 (two) times daily. Take 2 tablets for first  dose.  Then take one tablet every 12 hours for 5 days. 01/30/23   LampteyBritta Mccreedy, MD    Critical care time:   The patient is critically ill with multiple organ system failure and requires high complexity decision making for assessment and support, frequent evaluation and titration of therapies, advanced monitoring, review of radiographic studies and interpretation of complex data.   Critical Care Time devoted to patient care services, exclusive of separately billable procedures, described in this note is 44 minutes.  Tim Lair, PA-C [facility] 07/16/23 8:44 PM  Please see [email or web address] for pager details.  From 7A-7P if no response, please call [phone] After hours, please call ELink [phone]

## 2023-07-16 NOTE — ED Notes (Signed)
Unable to collect second blood culture

## 2023-07-16 NOTE — Progress Notes (Addendum)
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  On examination she is tachycardic, hypotensive saturating 100% on the ventilator.  Currently running fentanyl, propofol, and norepinephrine has been paused.  Treated with vancomycin and cefepime.   Results show severe metabolic acidosis combined with some respiratory acidosis.  Laboratory studies consistent with hyperkalemia, acute renal dysfunction with elevated creatinine, severe leukocytosis, and anemia.  CT head with paraspinal sinus disease, endotracheal tube and OG tube in appropriate positioning.  eICU Interventions  Continue mechanical ventilation, daily spontaneous awakening/breathing trial  Maintain norepinephrine as needed for MAP greater than 65  Add bicarbonate infusion given the severity of acidosis and worsening hypotension.  Maintain broad-spectrum antibiotics (cefepime, continue vancomycin given previous MRSA positivity and urine culture).  Obtain urine culture, blood cultures collected  DVT prophylaxis with heparin GI prophylaxis with pantoprazole    0317 -minimal urine output, persistent tachycardia, now hypertensive and off vasopressors.  Continuous renal replacement therapy in place.  Anticipated low urine output.  Continue observation.  Intervention Category Evaluation Type: New Patient Evaluation  Kaity Pitstick 07/16/2023, 10:53 PM

## 2023-07-16 NOTE — Progress Notes (Signed)
CCM notified of critical ABG values.

## 2023-07-16 NOTE — ED Triage Notes (Signed)
Patient BIB [facility] from home for abdominal pain with hematuria unknown duration. Patient unable to provide reliable history d/t decreased consciousness. Patient is hunched over and slow to respond at this time but VSS. BP 106/66, HR 102, CBG 109, RR 22

## 2023-07-16 NOTE — ED Notes (Signed)
MD Countryman made aware of Osmolality 355.

## 2023-07-16 NOTE — ED Notes (Signed)
Pt placed in wrist restraints, verbal order from MD Countryman.

## 2023-07-16 NOTE — ED Notes (Signed)
Attempted to check temperature x3, unable to obtain at this time.

## 2023-07-16 NOTE — ED Notes (Signed)
Pt agitated, attempting to pull out IV's, foley catheter, and cardiac monitor. MD Countryman made aware.

## 2023-07-16 NOTE — ED Provider Notes (Signed)
[place] EMERGENCY DEPARTMENT AT [facility] Provider Note   CSN: [id number] Arrival date & time: 07/16/23  1504     History Chief Complaint  Patient presents with   Abdominal Pain    HPI Marie Brewer is a 42 y.o. female presenting for mental status.  42 year old female with an expansive medical history including recurrent sepsis, stage IV kidney disease with acute renal failure in the past, polysubstance abuse, cervical cancer.  She is presenting with severely altered sensorium, GCS 12. Family member is at bedside states that she has been progressively altered over the last 72 hours but has been having flank pain and urinary symptoms for approximately 4 weeks but refused recurrent emergency department evaluation despite his recommendations. Patient to provide no further history.   Patient's recorded medical, surgical, social, medication list and allergies were reviewed in the Snapshot window as part of the initial history.   Review of Systems   Review of Systems  Unable to perform ROS: Mental status change    Physical Exam Updated Vital Signs BP 126/80   Pulse (!) 111   Temp (!) 97.4 F (36.3 C) (Axillary)   Resp 17   Ht 5\' 6"  (1.676 m)   Wt 70.3 kg   SpO2 100%   BMI 25.01 kg/m  Physical Exam Vitals and nursing note reviewed.  Constitutional:      General: She is not in acute distress.    Appearance: She is well-developed.  HENT:     Head: Normocephalic and atraumatic.  Eyes:     Conjunctiva/sclera: Conjunctivae normal.  Cardiovascular:     Rate and Rhythm: Regular rhythm. Tachycardia present.     Heart sounds: No murmur heard. Pulmonary:     Effort: No respiratory distress.     Breath sounds: Normal breath sounds.  Abdominal:     General: There is no distension.     Palpations: Abdomen is soft.     Tenderness: There is abdominal tenderness. There is no right CVA tenderness or left CVA tenderness.  Musculoskeletal:        General: No swelling  or tenderness. Normal range of motion.     Cervical back: Neck supple.     Right lower leg: Edema present.     Left lower leg: Edema present.  Skin:    General: Skin is warm and dry.     Findings: Lesion present.  Neurological:     General: No focal deficit present.     Mental Status: She is alert and oriented to person, place, and time. Mental status is at baseline.     Cranial Nerves: No cranial nerve deficit.      ED Course/ Medical Decision Making/ A&P Clinical Course as of 07/16/23 2347  Mon Jul 16, 2023  1810 I been called back to bedside 4 times for reassessment. Her mental status continues to wax and wane.  She started pulling at her lines, pulled her catheter tried to get out of the bed multiple times.  Not following instructions.  Treated with Haldol and Benadryl for suspected developing delirium.  Only minimal response.  Will broaden to include Ativan for possible withdrawal syndrome given her heavy alcohol use.  [CC]  1910 RSI successul  [CC]    Clinical Course User Index [CC] Glyn Ade, MD    Procedures .Critical Care  Performed by: Glyn Ade, MD Authorized by: Glyn Ade, MD   Critical care provider statement:    Critical care time (minutes):  100  Critical care was time spent personally by me on the following activities:  Development of treatment plan with patient or surrogate, discussions with consultants, evaluation of patient's response to treatment, examination of patient, ordering and review of laboratory studies, ordering and review of radiographic studies, ordering and performing treatments and interventions, pulse oximetry, re-evaluation of patient's condition and review of old charts Procedure Name: Intubation Date/Time: 07/16/2023 11:46 PM  Performed by: Glyn Ade, MDPre-anesthesia Checklist: Patient identified, Patient being monitored, Emergency Drugs available, Timeout performed and Suction available Oxygen Delivery  Method: Non-rebreather mask Preoxygenation: Pre-oxygenation with 100% oxygen Induction Type: Rapid sequence Ventilation: Mask ventilation without difficulty Grade View: Grade I Tube size: 7.0 mm Number of attempts: 1 Placement Confirmation: ETT inserted through vocal cords under direct vision, CO2 detector and Breath sounds checked- equal and bilateral       Medications Ordered in ED Medications  etomidate (AMIDATE) injection 21.1 mg (0 mg Intravenous Hold 07/16/23 1905)  rocuronium (ZEMURON) injection 70.3 mg (0 mg Intravenous Hold 07/16/23 1905)  norepinephrine (LEVOPHED) 4mg  in (0.016 mg/mL) premix infusion (0 mcg/min Intravenous Paused 07/16/23 1934)  fentaNYL in NS (56mcg/ml) infusion-PREMIX (50 mcg/hr Intravenous New Bag/Given 07/16/23 1916)  fentaNYL (SUBLIMAZE) bolus via infusion 50-100 mcg (50 mcg Intravenous Bolus from Bag 07/16/23 2330)  docusate (COLACE) 50 MG/5ML liquid 100 mg (100 mg Per Tube Not Given 07/16/23 2235)  polyethylene glycol (MIRALAX / GLYCOLAX) packet 17 g (17 g Per Tube Not Given 07/16/23 2235)  heparin injection 5,000 Units (has no administration in time range)  pantoprazole (PROTONIX) injection 40 mg (40 mg Intravenous Given 07/16/23 2222)  insulin aspart (novoLOG) injection 0-9 Units ( Subcutaneous Not Given 07/16/23 2111)  propofol (DIPRIVAN) 1000 MG/100ML infusion (5 mcg/kg/min  70.3 kg Intravenous New Bag/Given 07/16/23 2249)  Chlorhexidine Gluconate Cloth 2 % PADS 6 each (6 each Topical Given 07/16/23 2216)  Oral care mouth rinse (15 mLs Mouth Rinse Given 07/16/23 2217)  Oral care mouth rinse (has no administration in time range)  ceFEPIme (MAXIPIME) 1 g in sodium chloride 0.9 % 100 mL IVPB (has no administration in time range)  heparin injection 1,000-6,000 Units (has no administration in time range)  heparin 10,000 units/ 20 mL infusion syringe (has no administration in time range)  heparinized saline (2000 units/L) primer  fluid for CRRT (has no administration in time range)  sodium bicarbonate 150 mEq in sterile water 1,150 mL infusion (has no administration in time range)  sodium bicarbonate 150 mEq in sterile water 1,150 mL infusion (has no administration in time range)  prismasol BGK 2/2.5 dialysis solution (has no administration in time range)  midazolam (VERSED) injection 1-2 mg (has no administration in time range)  ceFEPIme (MAXIPIME) 2 g in sodium chloride 0.9 % 100 mL IVPB (0 g Intravenous Stopped 07/16/23 1754)  sodium chloride 0.9 % bolus 2,000 mL (0 mLs Intravenous Stopped 07/16/23 1855)  Vancomycin (VANCOCIN) 1,500 mg in sodium chloride 0.9 % 500 mL IVPB (0 mg Intravenous Stopped 07/16/23 2001)  diphenhydrAMINE (BENADRYL) injection 25 mg (25 mg Intravenous Given 07/16/23 1751)  haloperidol lactate (HALDOL) injection 2 mg (2 mg Intravenous Given 07/16/23 1752)  LORazepam (ATIVAN) injection 2 mg (2 mg Intravenous Given 07/16/23 1812)  sodium bicarbonate 1 mEq/mL injection (  Given 07/16/23 1900)  etomidate (AMIDATE) injection (10 mg Intravenous Given 07/16/23 1905)  rocuronium (ZEMURON) injection (50 mg Intravenous Given 07/16/23 1905)  fentaNYL (SUBLIMAZE) injection 50 mcg (50 mcg Intravenous Given 07/16/23 1917)  sodium bicarbonate injection 100  mEq (100 mEq Intravenous Given 07/16/23 2224)    Medical Decision Making:   Reba Dianna is a 42 y.o. female who presented to the ED today with multiple symptoms detailed above.    Additional history discussed with patient's family/caregivers.  Patient placed on continuous vitals and telemetry monitoring while in ED which was reviewed periodically.  Complete initial physical exam performed, notably the patient  was ill-appearing. During this initial exam, patient met criteria for activation of code sepsis due to presence of the following SIRS criteria as well as suspected infectious etiology:tachypnea, tachycardia, fever,  triage CBC with  leukocytosis. Reviewed and confirmed nursing documentation for past medical history, family history, social history.    Initial Assessment:   With the patient's presentation of signs and symptoms of sepsis, most likely diagnosis is bacteremia secondary to underlying infection.  Considerations for source were initiated including:Urinary tract infections, abdominal infections such as cholecystitis/cholangitis/appendicitis, pulmonary etiology, bacteremia, skin etiology such as cellulitis or fasciitis, neurologic etiology such as meningitis or encephalitis.  This is most consistent with an acute life/limb threatening illness complicated by underlying chronic conditions.  Initial Plan:  Activated hospital protocol code sepsis including blood cultures, lactic acid screening, and further diagnostic care and management. Therapeutically, resuscitation fluids were considered. Patient has no contraindication to fluid resuscitation and therefore 30 cc of IV fluids per kilogram were administered Therapeutically, antibiotics were administered on a broad-spectrum nature. Undifferentiated source: Vancomycin and cefepime were administered to cover gram-positive and gram-negative high risk infections  Screening labs including CBC and Metabolic panel to evaluate for infectious or metabolic etiology of disease.  Urinalysis with reflex culture ordered to evaluate for UTI or relevant urologic/nephrologic pathology.  CXR to evaluate for structural/infectious intrathoracic pathology.  EKG to evaluate for cardiac pathology  Reassessment and Plan:   I was called back to patient's bedside per the ED course as patient has continued to decompensate in the emergency room.  Her first VBG has a pH of 6.9.  She has a lactic of 6.1 leukocytosis in the 40s, acute renal failure, likely developing hepatic disease with an elevated ammonia. This constellation of concerning findings in the context of her worsening mental status made  for very difficult initial care. While trying to get these labs and get urine sample and evaluate the patient, she persistently became progressively encephalopathic and would not tolerate a care environment removing lines and catheters. Attempted conservative care with Haldol and Benadryl progressed to Ativan and patient was not tolerating these interventions continued to be unable to go lie in the CT scanner to rule out intracranial hemorrhage or other emergent pathology.  Discussed with family member at bedside who agreed with plan to escalate to intubation, mechanical ventilation due to her worsening clinical status, respiratory symptoms. RSI completed as above successfully and patient gradually improved and stabilized following this.  When on Levophed briefly following the intubation before returning back to normal pressures. I consulted critical care medicine.  I anticipate that this is all secondary to a untreated urinary tract infection leading to urosepsis.  She will need admission to the ICU for acute renal failure given this degree of illness.  Disposition:   Based on the above findings, I believe this patient is stable for admission.    Patient/family educated about specific findings on our evaluation and explained exact reasons for admission.  Patient/family educated about clinical situation and time was allowed to answer questions.   Admission team communicated with and agreed with need for admission. Patient admitted.  Patient  ready to move at this time.     Emergency Department Medication Summary:   Medications  etomidate (AMIDATE) injection 21.1 mg (0 mg Intravenous Hold 07/16/23 1905)  rocuronium (ZEMURON) injection 70.3 mg (0 mg Intravenous Hold 07/16/23 1905)  norepinephrine (LEVOPHED) 4mg  in (0.016 mg/mL) premix infusion (0 mcg/min Intravenous Paused 07/16/23 1934)  fentaNYL in NS (76mcg/ml) infusion-PREMIX (50 mcg/hr Intravenous New Bag/Given 07/16/23 1916)   fentaNYL (SUBLIMAZE) bolus via infusion 50-100 mcg (50 mcg Intravenous Bolus from Bag 07/16/23 2330)  docusate (COLACE) 50 MG/5ML liquid 100 mg (100 mg Per Tube Not Given 07/16/23 2235)  polyethylene glycol (MIRALAX / GLYCOLAX) packet 17 g (17 g Per Tube Not Given 07/16/23 2235)  heparin injection 5,000 Units (has no administration in time range)  pantoprazole (PROTONIX) injection 40 mg (40 mg Intravenous Given 07/16/23 2222)  insulin aspart (novoLOG) injection 0-9 Units ( Subcutaneous Not Given 07/16/23 2111)  propofol (DIPRIVAN) 1000 MG/100ML infusion (5 mcg/kg/min  70.3 kg Intravenous New Bag/Given 07/16/23 2249)  Chlorhexidine Gluconate Cloth 2 % PADS 6 each (6 each Topical Given 07/16/23 2216)  Oral care mouth rinse (15 mLs Mouth Rinse Given 07/16/23 2217)  Oral care mouth rinse (has no administration in time range)  ceFEPIme (MAXIPIME) 1 g in sodium chloride 0.9 % 100 mL IVPB (has no administration in time range)  heparin injection 1,000-6,000 Units (has no administration in time range)  heparin 10,000 units/ 20 mL infusion syringe (has no administration in time range)  heparinized saline (2000 units/L) primer fluid for CRRT (has no administration in time range)  sodium bicarbonate 150 mEq in sterile water 1,150 mL infusion (has no administration in time range)  sodium bicarbonate 150 mEq in sterile water 1,150 mL infusion (has no administration in time range)  prismasol BGK 2/2.5 dialysis solution (has no administration in time range)  midazolam (VERSED) injection 1-2 mg (has no administration in time range)  ceFEPIme (MAXIPIME) 2 g in sodium chloride 0.9 % 100 mL IVPB (0 g Intravenous Stopped 07/16/23 1754)  sodium chloride 0.9 % bolus 2,000 mL (0 mLs Intravenous Stopped 07/16/23 1855)  Vancomycin (VANCOCIN) 1,500 mg in sodium chloride 0.9 % 500 mL IVPB (0 mg Intravenous Stopped 07/16/23 2001)  diphenhydrAMINE (BENADRYL) injection 25 mg (25 mg Intravenous Given 07/16/23 1751)   haloperidol lactate (HALDOL) injection 2 mg (2 mg Intravenous Given 07/16/23 1752)  LORazepam (ATIVAN) injection 2 mg (2 mg Intravenous Given 07/16/23 1812)  sodium bicarbonate 1 mEq/mL injection (  Given 07/16/23 1900)  etomidate (AMIDATE) injection (10 mg Intravenous Given 07/16/23 1905)  rocuronium (ZEMURON) injection (50 mg Intravenous Given 07/16/23 1905)  fentaNYL (SUBLIMAZE) injection 50 mcg (50 mcg Intravenous Given 07/16/23 1917)  sodium bicarbonate injection 100 mEq (100 mEq Intravenous Given 07/16/23 2224)          Clinical Impression:  1. Altered mental status, unspecified altered mental status type      Admit   Final Clinical Impression(s) / ED Diagnoses Final diagnoses:  Altered mental status, unspecified altered mental status type    Rx / DC Orders ED Discharge Orders     None         Glyn Ade, MD 07/16/23 [phone]

## 2023-07-16 NOTE — Progress Notes (Signed)
07/16/2023 APP note to follow.  Seen for septic shock probably from ureteral stent infection. Abd pain, AMS, bloody urine. Intubated in ER. On exam she is poorly responsive on vent with kussmaul respirations Severe acute renal failure with acidemia and hyperkalemia noted Vanc/cefepime, check culture data Start emergent CRRT, nephrology consulted Gave some bicarb to temporize Pneumomediastinum probably some sort of boerhave's, monitor for now Aspiration pneumonitis covered by the abx above Vent bundle w/ high set MV Will need urology input at some point Guarded prognosis  My cc time 31 mins Myrla Halsted MD PCCM

## 2023-07-16 NOTE — Procedures (Signed)
Central Venous Catheter Insertion Procedure Note  Mikalee Bungard  [id number]  [birth year]  Date:07/16/23  Time:11:32 PM   Provider Performing:Joann Jorge Judie Petit Pecola Leisure   Procedure: Insertion of Non-tunneled Central Venous Catheter(36556)with US guidance (78469)    Indication(s) Difficult access and Hemodialysis  Consent Risks of the procedure as well as the alternatives and risks of each were explained to the patient and/or caregiver.  Consent for the procedure was obtained and is signed in the bedside chart  Anesthesia Topical only with 1% lidocaine   Timeout Verified patient identification, verified procedure, site/side was marked, verified correct patient position, special equipment/implants available, medications/allergies/relevant history reviewed, required imaging and test results available.  Sterile Technique Maximal sterile technique including full sterile barrier drape, hand hygiene, sterile gown, sterile gloves, mask, hair covering, sterile ultrasound probe cover (if used).  Procedure Description Area of catheter insertion was cleaned with chlorhexidine and draped in sterile fashion.   With real-time ultrasound guidance a HD catheter was placed into the right internal jugular vein.  Nonpulsatile blood flow and easy flushing noted in all ports.  The catheter was sutured in place and sterile dressing applied.    Complications/Tolerance None; patient tolerated the procedure well. Chest X-ray is ordered to verify placement for internal jugular or subclavian cannulation.  Chest x-ray is not ordered for femoral cannulation.  EBL Minimal  Specimen(s) None  Tim Lair, [facility] 07/16/23 11:33 PM  Please see [email or web address] for pager details.  From 7A-7P if no response, please call [phone] After hours, please call ELink [phone]

## 2023-07-16 NOTE — Progress Notes (Signed)
Pharmacy Antibiotic Note  Marie Brewer is a 41 y.o. female admitted on 07/16/2023 with sepsis.  Pharmacy has been consulted for Cefepime dosing. Cefepime 2g given at 1720 PM. Vancomycin 1500mg  IV x1 also given. WBC 46.1 (ANC 42). LA 0.8. Patient has CKD stage 4 with SCr up to 11.27. Cephalexin allergy noted- patient has tolerated multiple cephalosporins including Cefepime in the ED and ceftriaxone in past.   Plan: Cefepime 1g IV every 24 hours  F/up renal function/plans and adjust if needed  Height: 5\' 6"  (167.6 cm) Weight: 70.3 kg (154 lb 15.7 oz) IBW/kg (Calculated) : 59.3  Temp (24hrs), Avg:96.9 F (36.1 C), Min:96.7 F (35.9 C), Max:97.1 F (36.2 C)  Recent Labs  Lab 07/16/23 1652 07/16/23 1807 07/16/23 2007 07/16/23 2130  WBC 46.1*  --   --   --   CREATININE 12.13*  --   --  11.27*  LATICACIDVEN  --  6.1* 0.8  --     Estimated Creatinine Clearance: 6 mL/min (A) (by C-G formula based on SCr of 11.27 mg/dL (H)).    Allergies  Allergen Reactions   Imitrex [Sumatriptan Base] Palpitations    Heart races   Cephalexin Rash    Patient passed amoxicillin challenge on 08/18/20, no adverse effect    Antimicrobials this admission: Cefepime 12/16 >>  Dose adjustments this admission:   Microbiology results: 12/16 BCx:  12/16 MRSA PCR:   Thank you for allowing pharmacy to be a part of this patient's care.  Link Snuffer, PharmD, BCPS, BCCCP Please refer to [facility] for [facility] Pharmacy numbers 07/16/2023 10:12 PM

## 2023-07-16 NOTE — Consult Note (Signed)
Reason for Consult: AKI/CKD stage IV Referring Physician:  Katrinka Blazing, MD  Marie Brewer is an 42 y.o. female with a PMH significant for obstructive uropathy (bilateral stents), history of cervical cancer s/p chemo/radiation/brachytherapy, iron deficiency anemia, substance abuse, and CKD stage IV who presented to [facility] ED via EMS c/o abdominal pain, hematuria, and AMS.  EMS noted Bp 106/66, HR 102, CBG 109, RR 22.  In the ED, she became hypotensive with Bp of 42/16 and sepsis protocol initiated.  Temp 96.7, SpO2 92% and placed on nonrebreather.  Labs notable for Na 133, K 5.9, Cl 101, Co2 <7, BUN 158, Cr 12.13, Ca 10, alb 2.7, lipase 133, WBC 46.1, Hgb 13.6, plt 704, lactate 6.1.  CT of abd/pelvis revealed bilateral hydronephrosis with stents in place.  She became less responsive and was intubated in the ED.  She was admitted to the ICU and we were consulted due to the development of AKI/CKD stage IV.  The trend in Scr is seen below.  Trend in Creatinine: Creatinine, Ser  Date/Time Value Ref Range Status  07/16/2023 09:30 PM 11.27 (H) 0.44 - 1.00 mg/dL Final  16/05/9603 54:09 PM 12.13 (H) 0.44 - 1.00 mg/dL Final  81/19/1478 29:56 PM 4.05 (H) 0.44 - 1.00 mg/dL Final  21/30/8657 84:69 PM 4.08 (H) 0.44 - 1.00 mg/dL Final  62/95/2841 32:44 AM 4.56 (H) 0.44 - 1.00 mg/dL Final  08/02/7251 66:44 AM 4.89 (H) 0.44 - 1.00 mg/dL Final  03/47/4259 56:38 AM 5.55 (H) 0.44 - 1.00 mg/dL Final  75/64/3329 51:88 AM 5.85 (H) 0.44 - 1.00 mg/dL Final  41/66/0630 16:01 AM 7.65 (H) 0.44 - 1.00 mg/dL Final  09/32/3557 32:20 AM 8.64 (H) 0.44 - 1.00 mg/dL Final  25/42/7062 37:62 PM 9.00 (H) 0.44 - 1.00 mg/dL Final  83/15/1761 60:73 AM 10.02 (H) 0.44 - 1.00 mg/dL Final  71/12/2692 85:46 PM 8.19 (H) 0.44 - 1.00 mg/dL Final  27/09/5007 38:18 AM 10.73 (H) 0.44 - 1.00 mg/dL Final  29/93/7169 67:89 AM 1.09 (H) 0.44 - 1.00 mg/dL Final  38/04/1750 02:58 AM 1.09 (H) 0.44 - 1.00 mg/dL Final  52/77/8242 35:36 PM 1.31 (H) 0.44 - 1.00  mg/dL Final  14/43/1540 08:67 AM 1.03 (H) 0.44 - 1.00 mg/dL Final  61/95/0932 67:12 AM 0.60 0.44 - 1.00 mg/dL Final  45/80/9983 38:25 AM 0.72 0.44 - 1.00 mg/dL Final  05/39/7673 41:93 PM 0.75 0.44 - 1.00 mg/dL Final  79/09/4095 35:32 AM 0.80 0.44 - 1.00 mg/dL Final  99/24/2683 41:96 PM 0.88 0.50 - 1.10 mg/dL Final  22/29/7989 21:19 PM 0.79 0.4 - 1.2 mg/dL Final  41/74/0814 48:18 AM 0.72  Final    PMH:   Past Medical History:  Diagnosis Date   Anemia    Bilateral ureteral obstruction    Cancer (HCC)    Chronic kidney disease    History of blood transfusion 08/30/2021   1 unit   History of cancer chemotherapy    completed 06/ 2011 for cervical cancer   History of cervical cancer 08/2009   followed by [facility];  dx SCC Stage IIB,  completed chemoradiation 06/ 2011   History of radiation therapy    completed 06/ 2011 ffor cervical cancer   Migraine headache    Polysubstance abuse (HCC) 05/05/2020   heroin    PSH:   Past Surgical History:  Procedure Laterality Date   CHOLECYSTECTOMY     CYSTOSCOPY W/ URETERAL STENT PLACEMENT Bilateral 08/15/2020   Procedure: CYSTOSCOPY WITH RETROGRADE PYELOGRAM/URETERAL STENT PLACEMENT;  Surgeon: Rhoderick Moody  Clifton Custard, MD;  Location: [facility] OR;  Service: Urology;  Laterality: Bilateral;   CYSTOSCOPY W/ URETERAL STENT PLACEMENT Bilateral 09/07/2021   Procedure: CYSTOSCOPY WITH RETROGRADE PYELOGRAM/URETERAL STENT EXCHANGE;  Surgeon: Rene Paci, MD;  Location: [facility] ORS;  Service: Urology;  Laterality: Bilateral;   CYSTOSCOPY W/ URETERAL STENT PLACEMENT Bilateral 04/19/2022   Procedure: CYSTOSCOPY WITH STENT EXCHANGE;  Surgeon: Rene Paci, MD;  Location: [facility];  Service: Urology;  Laterality: Bilateral;   CYSTOSCOPY WITH STENT PLACEMENT Bilateral 11/15/2022   Procedure: CYSTOSCOPY WITH BILATERAL STENT EXCHANGE;  Surgeon: Rene Paci, MD;  Location: [facility];  Service: Urology;   Laterality: Bilateral;   INCISION AND DRAINAGE ABSCESS  04/09/2003   @[facility]  ;  RIGHT SUBMANDIBULAR ABSCESS (LARGE) AND EXTRACTION MULTIPLE TEETH   IRRIGATION AND DEBRIDEMENT ABSCESS Bilateral 05/05/2020   Procedure: left forearm incision and drainage and aspiration of the right wrist joint;  Surgeon: Allena Napoleon, MD;  Location: [facility];  Service: Plastics;  Laterality: Bilateral;    Allergies:  Allergies  Allergen Reactions   Imitrex [Sumatriptan Base] Palpitations    Heart races   Cephalexin Rash    Patient passed amoxicillin challenge on 08/18/20, no adverse effect    Medications:   Prior to Admission medications   Medication Sig Start Date End Date Taking? Authorizing Provider  ciprofloxacin (CIPRO) 500 MG tablet Take 1 tablet (500 mg total) by mouth daily. 01/26/23   Raspet, Noberto Retort, PA-C  ferrous sulfate 325 (65 FE) MG tablet Take 325 mg by mouth daily.    [provider]  Multiple Vitamin (MULTIVITAMIN WITH MINERALS) TABS tablet Take 1 tablet by mouth daily.    [provider]  oxybutynin (DITROPAN) 5 MG tablet Take 5 mg by mouth every 8 (eight) hours as needed for bladder spasms.    [provider]  sulfamethoxazole-trimethoprim (BACTRIM) 400-80 MG tablet Take 1 tablet by mouth 2 (two) times daily. Take 2 tablets for first dose.  Then take one tablet every 12 hours for 5 days. 01/30/23   Merrilee Jansky, MD    Inpatient medications:  Chlorhexidine Gluconate Cloth  6 each Topical Daily   docusate  100 mg Per Tube BID   etomidate  0.3 mg/kg Intravenous Once   [START ON 07/17/2023] heparin  5,000 Units Subcutaneous Q8H   insulin aspart  0-9 Units Subcutaneous Q4H   mouth rinse  15 mL Mouth Rinse Q2H   pantoprazole (PROTONIX) IV  40 mg Intravenous QHS   polyethylene glycol  17 g Per Tube Daily   rocuronium  1 mg/kg Intravenous Once    Discontinued Meds:  There are no discontinued medications.  Social History:  reports that she has been smoking  cigarettes. She has a 2.5 pack-year smoking history. She has never used smokeless tobacco. She reports that she does not currently use drugs after having used the following drugs: Heroin. She reports that she does not drink alcohol.  Family History:   Family History  Problem Relation Age of Onset   Autism spectrum disorder Other     Review of systems not obtained due to patient factors. Weight change:  No intake or output data in the 24 hours ending 07/16/23 2251 BP 124/73   Pulse (!) 105   Temp (!) 97.4 F (36.3 C) (Axillary)   Resp (!) 24   Ht 5\' 6"  (1.676 m)   Wt 70.3 kg   SpO2 97%   BMI 25.01 kg/m  Vitals:   07/16/23  2115 07/16/23 2121 07/16/23 2130 07/16/23 2231  BP: 136/73 127/70 124/73   Pulse: (!) 104 (!) 104 (!) 105   Resp: (!) 21 (!) 28 (!) 24   Temp:    (!) 97.4 F (36.3 C)  TempSrc:    Axillary  SpO2: 97% 97% 97%   Weight:      Height:         General appearance: intubated and sedated, cachectic Head: Normocephalic, without obvious abnormality, atraumatic Resp: ventilated BS bilaterally Cardio: regular rate and rhythm, S1, S2 normal, no murmur, click, rub or gallop GI: soft, non-tender; bowel sounds normal; no masses,  no organomegaly Extremities: legs wrapped in bandages  Labs: Basic Metabolic Panel: Recent Labs  Lab 07/16/23 1652 07/16/23 1658 07/16/23 2039 07/16/23 2130  NA 130* 133* 137 134*  K 5.7* 5.9* 5.5* 6.0*  CL 101  --   --  109  CO2 <7*  --   --  10*  GLUCOSE 129*  --   --  120*  BUN 158*  --   --  150*  CREATININE 12.13*  --   --  11.27*  ALBUMIN 2.7*  --   --   --   CALCIUM 10.0  --   --  8.3*   Liver Function Tests: Recent Labs  Lab 07/16/23 1652  AST 24  ALT 15  ALKPHOS 127*  BILITOT 0.8  PROT 11.3*  ALBUMIN 2.7*   Recent Labs  Lab 07/16/23 1652  LIPASE 133*   Recent Labs  Lab 07/16/23 1649  AMMONIA 79*   CBC: Recent Labs  Lab 07/16/23 1652 07/16/23 1658 07/16/23 2039  WBC 46.1*  --   --   NEUTROABS  42.0*  --   --   HGB 10.8* 13.6 8.8*  HCT 36.0 40.0 26.0*  MCV 95.2  --   --   PLT 704*  --   --    PT/INR: @LABRCNTIP (inr:5) Cardiac Enzymes: ) Recent Labs  Lab 07/16/23 1652  CKTOTAL 198   CBG: Recent Labs  Lab 07/16/23 1725 07/16/23 2108 07/16/23 2205  GLUCAP 98 107* 108*    Iron Studies: No results for input(s): "IRON", "TIBC", "TRANSFERRIN", "FERRITIN" in the last 168 hours.  Xrays/Other Studies: CT CHEST ABDOMEN PELVIS WO CONTRAST Result Date: 07/16/2023 CLINICAL DATA:  Sepsis, delirium EXAM: CT CHEST, ABDOMEN AND PELVIS WITHOUT CONTRAST TECHNIQUE: Multidetector CT imaging of the chest, abdomen and pelvis was performed following the standard protocol without IV contrast. RADIATION DOSE REDUCTION: This exam was performed according to the departmental dose-optimization program which includes automated exposure control, adjustment of the mA and/or kV according to patient size and/or use of iterative reconstruction technique. COMPARISON:  Chest radiograph 07/16/2023; CT abdomen and pelvis 08/05/2021 and CT chest 08/18/2020 FINDINGS: CT CHEST FINDINGS Cardiovascular: Normal heart size.  No pericardial effusion. Mediastinum/Nodes: Endotracheal tube tip in the intrathoracic trachea. Enteric tube tip in the stomach. Small amount of pneumomediastinum. No definite thoracic adenopathy Lungs/Pleura: Bilateral centrilobular micro nodules and tree-in-bud opacities greatest in the bilateral lower lobes compatible with small airway infection/inflammation and/or aspiration. No pleural effusion or pneumothorax. Musculoskeletal: No acute fracture. CT ABDOMEN PELVIS FINDINGS Hepatobiliary: Unremarkable noncontrast appearance of the liver. Cholecystectomy. No biliary dilation. Pancreas: Unremarkable. Spleen: Unremarkable. Adrenals/Urinary Tract: Stable adrenal glands. Bilateral ureteral stents. Bilateral hydronephrosis is similar to 08/05/2021. Thick-walled nondistended urinary bladder about a Foley  catheter. Locules of gas in the bladder. Stomach/Bowel: No bowel obstruction or bowel wall thickening. The appendix is normal. Stomach is within normal limits.  Vascular/Lymphatic: Similar prominent retroperitoneal lymph nodes. For example, 1.0 cm left para-aortic lymph node. Bilateral inguinal adenopathy measuring 1.7 cm on the left and 1.8 cm on the right is similar to decreased compared to 08/05/2021. No new or enlarging thoracic adenopathy. Unremarkable noncontrast appearance of the vasculature. Reproductive: Unremarkable. Other: Small volume free fluid in the pelvis. No free intraperitoneal air. Musculoskeletal: No acute fracture. IMPRESSION: 1. Bilateral centrilobular micro nodules and tree-in-bud opacities greatest in the bilateral lower lobes compatible with small airway infection/inflammation and/or aspiration. 2. Small amount of pneumomediastinum. 3. Thick-walled nondistended urinary bladder about a Foley catheter. Correlate with urinalysis to exclude cystitis. 4. Bilateral ureteral stents. Bilateral hydronephrosis is similar to 08/05/2021. 5. Similar prominent retroperitoneal lymph nodes and decreased inguinal lymph nodes. Electronically Signed   By: Minerva Fester M.D.   On: 07/16/2023 22:13   CT Head Wo Contrast Result Date: 07/16/2023 CLINICAL DATA:  Delirium and sepsis EXAM: CT HEAD WITHOUT CONTRAST TECHNIQUE: Contiguous axial images were obtained from the base of the skull through the vertex without intravenous contrast. RADIATION DOSE REDUCTION: This exam was performed according to the departmental dose-optimization program which includes automated exposure control, adjustment of the mA and/or kV according to patient size and/or use of iterative reconstruction technique. COMPARISON:  None Available. FINDINGS: Brain: There is no mass, hemorrhage or extra-axial collection. Normal appearance of the white matter with preserved gray-white differentiation. Normal CSF spaces. Vascular: Negative Skull:  Negative Sinuses/Orbits: Moderate paranasal sinus disease. Other: Endotracheal intubation IMPRESSION: 1. No acute intracranial abnormality. 2. Diffuse, moderately severe paranasal sinus disease. Electronically Signed   By: Deatra Robinson M.D.   On: 07/16/2023 21:13   DG Chest Portable 1 View Result Date: 07/16/2023 CLINICAL DATA:  Shortness of breath: ETT placement EXAM: PORTABLE CHEST 1 VIEW COMPARISON:  Same day chest radiograph FINDINGS: Stable cardiomediastinal silhouette. Aortic atherosclerotic calcification. No focal consolidation, pleural effusion, or pneumothorax. No displaced rib fractures. Subdiaphragmatic enteric tube with side port in the stomach. Endotracheal tube tip in the mid intrathoracic trachea proximally cm from the carina. IMPRESSION: Endotracheal tube tip in the mid intrathoracic trachea. Subdiaphragmatic enteric tube. Otherwise no change from radiographs earlier today. Electronically Signed   By: Minerva Fester M.D.   On: 07/16/2023 20:57   DG Chest 1 View Result Date: 07/16/2023 CLINICAL DATA:  Reason for exam: Patient order states screening. Best obtainable images due to patient cooperation and condition. EXAM: CHEST  1 VIEW COMPARISON:  11/24/2007 radiographic and 08/18/2020 CT chest FINDINGS: Normal cardiomediastinal silhouette. No focal consolidation, pleural effusion, or pneumothorax. No displaced rib fractures. IMPRESSION: No acute cardiopulmonary disease. Electronically Signed   By: Minerva Fester M.D.   On: 07/16/2023 20:54     Assessment/Plan:  AKI/CKD stage IV - presumably ischemic ATN in setting of urosepsis.  Currently on pressors.  Has significant acidemia and hyperkalemia.  Will plan to initiate CRRT with isotonic bicarb as replacement fluids.  Will plan to keep positive with CRRT.  Continue to follow labs after initiating CRRT.  Will use 2K/2.5Ca as dialysate at 1500 mL/hr, pre- and post-filter replacement fluid isotonic bicarb at 125 mL/hr Avoid nephrotoxic  medications including NSAIDs and iodinated intravenous contrast exposure unless the latter is absolutely indicated.   Preferred narcotic agents for pain control are hydromorphone, fentanyl, and methadone. Morphine should not be used.  Avoid Baclofen and avoid oral sodium phosphate and magnesium citrate based laxatives / bowel preps.  Continue strict Input and Output monitoring. Will monitor the patient closely with you and intervene  or adjust therapy as indicated by changes in clinical status/labs  Septic shock - given IVF's and started on pressors.  Presumed urosepsis from retained bilateral JJ-stents (last exchanged 10/2022) and missed exchange in July.  Abx and pressor management per PCCM VDRF - CT of chest with possible aspiration.  Vent settings per PCCM Obstructive uropathy - followed by Urology and has bilateral stents in place as above. Hyperkalemia - due to #1.  Plan for CRRT after HD catheter placed by PCCM Lactic acidosis/AGMA - due to sepsis and AKI.  Isotonic bicarb with CRRT and follow.   Julien Nordmann Vin Yonke 07/16/2023, 10:51 PM

## 2023-07-16 NOTE — Progress Notes (Signed)
ED Pharmacy Antibiotic Sign Off An antibiotic consult was received from an ED provider for Vancomycin per pharmacy dosing for Sepsis. A chart review was completed to assess appropriateness.   The following one time order(s) were placed:  Vancomycin 1500mg  IV x1 in ED   Further antibiotic and/or antibiotic pharmacy consults should be ordered by the admitting provider if indicated.   Thank you for allowing pharmacy to be a part of this patient's care.   Wilburn Cornelia, PharmD, BCPS Clinical Pharmacist 07/16/2023 4:56 PM   Please refer to [facility] for pharmacy phone number

## 2023-07-17 ENCOUNTER — Inpatient Hospital Stay (HOSPITAL_COMMUNITY): Payer: Commercial Managed Care - HMO

## 2023-07-17 ENCOUNTER — Encounter (HOSPITAL_COMMUNITY): Payer: Self-pay | Admitting: Internal Medicine

## 2023-07-17 DIAGNOSIS — R6521 Severe sepsis with septic shock: Secondary | ICD-10-CM | POA: Diagnosis not present

## 2023-07-17 DIAGNOSIS — N390 Urinary tract infection, site not specified: Secondary | ICD-10-CM | POA: Diagnosis not present

## 2023-07-17 DIAGNOSIS — J9600 Acute respiratory failure, unspecified whether with hypoxia or hypercapnia: Secondary | ICD-10-CM | POA: Diagnosis not present

## 2023-07-17 DIAGNOSIS — R4182 Altered mental status, unspecified: Secondary | ICD-10-CM | POA: Diagnosis not present

## 2023-07-17 DIAGNOSIS — A419 Sepsis, unspecified organism: Secondary | ICD-10-CM | POA: Diagnosis not present

## 2023-07-17 HISTORY — PX: IR NEPHROSTOMY PLACEMENT RIGHT: IMG6064

## 2023-07-17 HISTORY — PX: IR URETERAL STENT PERC REMOVAL MOD SED: IMG2338

## 2023-07-17 HISTORY — PX: IR NEPHROSTOMY PLACEMENT LEFT: IMG6063

## 2023-07-17 LAB — BLOOD CULTURE ID PANEL (REFLEXED) - BCID2
A.calcoaceticus-baumannii: NOT DETECTED
A.calcoaceticus-baumannii: NOT DETECTED
Bacteroides fragilis: NOT DETECTED
Bacteroides fragilis: NOT DETECTED
Candida albicans: NOT DETECTED
Candida albicans: NOT DETECTED
Candida auris: NOT DETECTED
Candida auris: NOT DETECTED
Candida glabrata: NOT DETECTED
Candida glabrata: NOT DETECTED
Candida krusei: NOT DETECTED
Candida krusei: NOT DETECTED
Candida parapsilosis: NOT DETECTED
Candida parapsilosis: NOT DETECTED
Candida tropicalis: NOT DETECTED
Candida tropicalis: NOT DETECTED
Cryptococcus neoformans/gattii: NOT DETECTED
Cryptococcus neoformans/gattii: NOT DETECTED
Enterobacter cloacae complex: NOT DETECTED
Enterobacter cloacae complex: NOT DETECTED
Enterobacterales: NOT DETECTED
Enterobacterales: NOT DETECTED
Enterococcus Faecium: NOT DETECTED
Enterococcus Faecium: NOT DETECTED
Enterococcus faecalis: NOT DETECTED
Enterococcus faecalis: NOT DETECTED
Escherichia coli: NOT DETECTED
Escherichia coli: NOT DETECTED
Haemophilus influenzae: NOT DETECTED
Haemophilus influenzae: NOT DETECTED
Klebsiella aerogenes: NOT DETECTED
Klebsiella aerogenes: NOT DETECTED
Klebsiella oxytoca: NOT DETECTED
Klebsiella oxytoca: NOT DETECTED
Klebsiella pneumoniae: NOT DETECTED
Klebsiella pneumoniae: NOT DETECTED
Listeria monocytogenes: NOT DETECTED
Listeria monocytogenes: NOT DETECTED
Methicillin resistance mecA/C: DETECTED — AB
Neisseria meningitidis: NOT DETECTED
Neisseria meningitidis: NOT DETECTED
Proteus species: NOT DETECTED
Proteus species: NOT DETECTED
Pseudomonas aeruginosa: NOT DETECTED
Pseudomonas aeruginosa: NOT DETECTED
Salmonella species: NOT DETECTED
Salmonella species: NOT DETECTED
Serratia marcescens: NOT DETECTED
Serratia marcescens: NOT DETECTED
Staphylococcus aureus (BCID): NOT DETECTED
Staphylococcus aureus (BCID): NOT DETECTED
Staphylococcus epidermidis: NOT DETECTED
Staphylococcus epidermidis: DETECTED — AB
Staphylococcus lugdunensis: NOT DETECTED
Staphylococcus lugdunensis: NOT DETECTED
Staphylococcus species: NOT DETECTED
Staphylococcus species: DETECTED — AB
Stenotrophomonas maltophilia: NOT DETECTED
Stenotrophomonas maltophilia: NOT DETECTED
Streptococcus agalactiae: NOT DETECTED
Streptococcus agalactiae: NOT DETECTED
Streptococcus pneumoniae: NOT DETECTED
Streptococcus pneumoniae: NOT DETECTED
Streptococcus pyogenes: DETECTED — AB
Streptococcus pyogenes: NOT DETECTED
Streptococcus species: DETECTED — AB
Streptococcus species: NOT DETECTED

## 2023-07-17 LAB — URINALYSIS, MICROSCOPIC (REFLEX)
RBC / HPF: >50 RBC/hpf (ref 0–5)
WBC, UA: >50 WBC/hpf (ref 0–5)

## 2023-07-17 LAB — RENAL FUNCTION PANEL
Albumin: 2.1 g/dL — ABNORMAL LOW (ref 3.5–5.0)
Albumin: 2 g/dL — ABNORMAL LOW (ref 3.5–5.0)
Anion gap: 18 — ABNORMAL HIGH (ref 5–15)
Anion gap: 11 (ref 5–15)
BUN: 133 mg/dL — ABNORMAL HIGH (ref 6–20)
BUN: 67 mg/dL — ABNORMAL HIGH (ref 6–20)
CO2: 14 mmol/L — ABNORMAL LOW (ref 22–32)
CO2: 21 mmol/L — ABNORMAL LOW (ref 22–32)
Calcium: 7.5 mg/dL — ABNORMAL LOW (ref 8.9–10.3)
Calcium: 7.6 mg/dL — ABNORMAL LOW (ref 8.9–10.3)
Chloride: 106 mmol/L (ref 98–111)
Chloride: 102 mmol/L (ref 98–111)
Creatinine, Ser: 9.56 mg/dL — ABNORMAL HIGH (ref 0.44–1.00)
Creatinine, Ser: 4.93 mg/dL — ABNORMAL HIGH (ref 0.44–1.00)
GFR, Estimated: 5 mL/min — ABNORMAL LOW (ref >60)
GFR, Estimated: 11 mL/min — ABNORMAL LOW (ref >60)
Glucose, Bld: 98 mg/dL (ref 70–99)
Glucose, Bld: 98 mg/dL (ref 70–99)
Phosphorus: 8.1 mg/dL — ABNORMAL HIGH (ref 2.5–4.6)
Phosphorus: 4.1 mg/dL (ref 2.5–4.6)
Potassium: 4.3 mmol/L (ref 3.5–5.1)
Potassium: 3.9 mmol/L (ref 3.5–5.1)
Sodium: 138 mmol/L (ref 135–145)
Sodium: 134 mmol/L — ABNORMAL LOW (ref 135–145)

## 2023-07-17 LAB — RAPID URINE DRUG SCREEN, HOSP PERFORMED
Amphetamines: NOT DETECTED
Barbiturates: NOT DETECTED
Benzodiazepines: NOT DETECTED
Cocaine: POSITIVE — AB
Opiates: NOT DETECTED
Tetrahydrocannabinol: NOT DETECTED

## 2023-07-17 LAB — GLUCOSE, CAPILLARY
Glucose-Capillary: 102 mg/dL — ABNORMAL HIGH (ref 70–99)
Glucose-Capillary: 119 mg/dL — ABNORMAL HIGH (ref 70–99)
Glucose-Capillary: 161 mg/dL — ABNORMAL HIGH (ref 70–99)
Glucose-Capillary: 57 mg/dL — ABNORMAL LOW (ref 70–99)
Glucose-Capillary: 57 mg/dL — ABNORMAL LOW (ref 70–99)
Glucose-Capillary: 70 mg/dL (ref 70–99)
Glucose-Capillary: 71 mg/dL (ref 70–99)
Glucose-Capillary: 77 mg/dL (ref 70–99)
Glucose-Capillary: 89 mg/dL (ref 70–99)
Glucose-Capillary: 92 mg/dL (ref 70–99)

## 2023-07-17 LAB — PHOSPHORUS: Phosphorus: 8.1 mg/dL — ABNORMAL HIGH (ref 2.5–4.6)

## 2023-07-17 LAB — RESPIRATORY PANEL BY PCR

## 2023-07-17 LAB — CBC
HCT: 25 % — ABNORMAL LOW (ref 36.0–46.0)
Hemoglobin: 8.2 g/dL — ABNORMAL LOW (ref 12.0–15.0)
MCH: 29.1 pg (ref 26.0–34.0)
MCHC: 32.8 g/dL (ref 30.0–36.0)
MCV: 88.7 fL (ref 80.0–100.0)
Platelets: 530 10*3/uL — ABNORMAL HIGH (ref 150–400)
RBC: 2.82 MIL/uL — ABNORMAL LOW (ref 3.87–5.11)
RDW: 14.5 % (ref 11.5–15.5)
WBC: 31.3 10*3/uL — ABNORMAL HIGH (ref 4.0–10.5)
nRBC: 0.1 % (ref 0.0–0.2)

## 2023-07-17 LAB — HIV ANTIBODY (ROUTINE TESTING W REFLEX): HIV Screen 4th Generation wRfx: NONREACTIVE

## 2023-07-17 LAB — POCT I-STAT EG7
Acid-base deficit: 3 mmol/L — ABNORMAL HIGH (ref 0.0–2.0)
Bicarbonate: 23.7 mmol/L (ref 20.0–28.0)
Calcium, Ion: 0.95 mmol/L — ABNORMAL LOW (ref 1.15–1.40)
HCT: 26 % — ABNORMAL LOW (ref 36.0–46.0)
Hemoglobin: 8.8 g/dL — ABNORMAL LOW (ref 12.0–15.0)
O2 Saturation: 79 %
Patient temperature: 98.2
Potassium: 3.5 mmol/L (ref 3.5–5.1)
Sodium: 139 mmol/L (ref 135–145)
TCO2: 25 mmol/L (ref 22–32)
pCO2, Ven: 46.8 mm[Hg] (ref 44–60)
pH, Ven: 7.311 (ref 7.25–7.43)
pO2, Ven: 47 mm[Hg] — ABNORMAL HIGH (ref 32–45)

## 2023-07-17 LAB — URINALYSIS, ROUTINE W REFLEX MICROSCOPIC
Glucose, UA: 100 mg/dL — AB
Ketones, ur: 15 mg/dL — AB
Nitrite: POSITIVE — AB
Protein, ur: >300 mg/dL — AB
Specific Gravity, Urine: 1.01 (ref 1.005–1.030)
pH: 8.5 — ABNORMAL HIGH (ref 5.0–8.0)

## 2023-07-17 LAB — MAGNESIUM: Magnesium: 2 mg/dL (ref 1.7–2.4)

## 2023-07-17 LAB — PROTIME-INR
INR: 1.2 (ref 0.8–1.2)
Prothrombin Time: 15.7 s — ABNORMAL HIGH (ref 11.4–15.2)

## 2023-07-17 LAB — TRIGLYCERIDES: Triglycerides: 214 mg/dL — ABNORMAL HIGH (ref <150)

## 2023-07-17 LAB — APTT: aPTT: 27 s (ref 24–36)

## 2023-07-17 MED ORDER — SODIUM CHLORIDE 0.9 % IV SOLN: INTRAVENOUS | Status: AC | PRN

## 2023-07-17 MED ORDER — PRISMASOL BGK 4/2.5 32-4-2.5 MEQ/L REPLACEMENT SOLN
Status: DC
  Filled 2023-07-17 (×23): qty 5000

## 2023-07-17 MED ORDER — RENA-VITE PO TABS
1.0000 | ORAL_TABLET | Freq: Every day | ORAL | Status: DC
Start: 2023-07-17 — End: 2023-07-25
  Administered 2023-07-17 – 2023-07-24 (×8): 1
  Filled 2023-07-17 (×8): qty 1

## 2023-07-17 MED ORDER — VANCOMYCIN HCL IN DEXTROSE 1-5 GM/200ML-% IV SOLN: 1000.0000 mg | INTRAVENOUS | Status: DC

## 2023-07-17 MED ORDER — LINEZOLID 600 MG/300ML IV SOLN
600.0000 mg | Freq: Two times a day (BID) | INTRAVENOUS | Status: DC
  Administered 2023-07-17 – 2023-07-20 (×7): 600 mg via INTRAVENOUS
  Filled 2023-07-17 (×7): qty 300

## 2023-07-17 MED ORDER — SODIUM CHLORIDE 0.9% FLUSH
5.0000 mL | Freq: Three times a day (TID) | INTRAVENOUS | Status: DC
  Administered 2023-07-17 – 2023-07-27 (×28): 5 mL

## 2023-07-17 MED ORDER — LIDOCAINE HCL 1 % IJ SOLN
20.0000 mL | Freq: Once | INTRAMUSCULAR | Status: AC
  Administered 2023-07-17: 10 mL via INTRADERMAL
  Filled 2023-07-17: qty 20

## 2023-07-17 MED ORDER — PRISMASOL BGK 4/2.5 32-4-2.5 MEQ/L REPLACEMENT SOLN
Status: DC
  Filled 2023-07-17 (×12): qty 5000

## 2023-07-17 MED ORDER — METRONIDAZOLE 500 MG/100ML IV SOLN
500.0000 mg | Freq: Two times a day (BID) | INTRAVENOUS | Status: DC
  Administered 2023-07-17 – 2023-07-18 (×3): 500 mg via INTRAVENOUS
  Filled 2023-07-17 (×2): qty 100

## 2023-07-17 MED ORDER — LIDOCAINE HCL 1 % IJ SOLN
INTRAMUSCULAR | Status: AC
  Filled 2023-07-17: qty 20

## 2023-07-17 MED ORDER — SODIUM CHLORIDE 0.9 % IV SOLN: 350.0000 [IU]/h | INTRAVENOUS | Status: DC

## 2023-07-17 MED ORDER — VITAL 1.5 CAL PO LIQD
1000.0000 mL | ORAL | Status: DC
  Administered 2023-07-17 (×2): 1000 mL

## 2023-07-17 MED ORDER — PROSOURCE TF20 ENFIT COMPATIBL EN LIQD
60.0000 mL | Freq: Two times a day (BID) | ENTERAL | Status: DC
  Administered 2023-07-17 – 2023-07-24 (×16): 60 mL
  Filled 2023-07-17 (×16): qty 60

## 2023-07-17 MED ORDER — SODIUM CHLORIDE 0.9 % IV SOLN
2.0000 g | Freq: Two times a day (BID) | INTRAVENOUS | Status: DC
  Administered 2023-07-17 – 2023-07-19 (×5): 2 g via INTRAVENOUS
  Filled 2023-07-17 (×5): qty 12.5

## 2023-07-17 MED ORDER — PRISMASOL BGK 4/2.5 32-4-2.5 MEQ/L EC SOLN
Status: DC
  Filled 2023-07-17 (×63): qty 5000

## 2023-07-17 MED ORDER — IOHEXOL 300 MG/ML  SOLN
50.0000 mL | Freq: Once | INTRAMUSCULAR | Status: AC | PRN
  Administered 2023-07-17: 25 mL

## 2023-07-17 NOTE — Progress Notes (Signed)
Pharmacy Antibiotic Note  Marie Brewer is a 42 y.o. female admitted on 07/16/2023 with sepsis.  Pharmacy has been consulted for Vancomycin/Cefepime dosing. WBC markedly elevated.   Starting CRRT  Plan: Vancomycin 1000 mg IV q24h Cefepime 2g IV q12h F/U CRRT tolerance Drug levels as indicated   Height: 5\' 6"  (167.6 cm) Weight: 70.3 kg (154 lb 15.7 oz) IBW/kg (Calculated) : 59.3  Temp (24hrs), Avg:97.1 F (36.2 C), Min:96.7 F (35.9 C), Max:97.4 F (36.3 C)  Recent Labs  Lab 07/16/23 1652 07/16/23 1807 07/16/23 2007 07/16/23 2130  WBC 46.1*  --   --   --   CREATININE 12.13*  --   --  11.27*  LATICACIDVEN  --  6.1* 0.8  --     Estimated Creatinine Clearance: 6 mL/min (A) (by C-G formula based on SCr of 11.27 mg/dL (H)).    Allergies  Allergen Reactions   Imitrex [Sumatriptan Base] Palpitations    Heart races   Cephalexin Rash    Patient passed amoxicillin challenge on 08/18/20, no adverse effect    Abran Duke, PharmD, BCPS Clinical Pharmacist Phone: [phone]

## 2023-07-17 NOTE — Plan of Care (Signed)
  Problem: Fluid Volume: Goal: Ability to maintain a balanced intake and output will improve Outcome: Progressing   Problem: Metabolic: Goal: Ability to maintain appropriate glucose levels will improve Outcome: Progressing   Problem: Nutritional: Goal: Maintenance of adequate nutrition will improve Outcome: Progressing   Problem: Skin Integrity: Goal: Risk for impaired skin integrity will decrease Outcome: Progressing   Problem: Tissue Perfusion: Goal: Adequacy of tissue perfusion will improve Outcome: Progressing   Problem: Nutrition: Goal: Adequate nutrition will be maintained Outcome: Progressing   Problem: Coping: Goal: Level of anxiety will decrease Outcome: Progressing

## 2023-07-17 NOTE — Progress Notes (Addendum)
NAME:  Marie Brewer, MRN:  [id number], DOB:  October 09, 1980, LOS: 1 ADMISSION DATE:  07/16/2023, CONSULTATION DATE:  07/16/23 REFERRING MD:  Elisabeth Pigeon, CHIEF COMPLAINT:  Sepsis, presumed urosepsis   History of present illness   42 year old woman who presented to [facility] ED 12/16 for unresponsiveness. PMHx significant for CKD stage 4, bilateral ureteral obstruction (s/p stent placement 10/2022), cervical CA stage IIB (s/p chemotherapy/XRT, followed at [facility]), polysubstance abuse (current heroin use via inhalation).   Patient presented to [facility] ED with decreased responsiveness x 3 days, per patient's husband (at bedside). Has not been feeling well for about a month but is often hesitant to come to the ED and was adamantly refusing going to the hospital. PO intake has been poor, has been only drinking [facility] and not eating much. No reported fevers at home, but chills/general malaise. Intermittent nausea, no vomiting/diarrhea. Per husband, has complained of abdominal pain/pain with urination. Of note, has bilateral ureteral stents placed in 03/2022; she was supposed to follow up for stent exchange 01/2023 but did not present as scheduled. Additionally, husband notes she continues to use heroin via inhalation, denies injection.   On ED arrival, patient was afebrile, HR 111, BP 126/80, SpO2 100%. She was initially somnolent with GCS 12 but intermittently agitated/not following command, thrashing and causing risk to herself/staff. Ultimately patient was intubated for her safety/airway protection. Labs were notable for WBC 46.1, Hgb 10.8, Plt 704 (suspect concentrated), Na 130, K 5.7, CO2 < 7, BUN 158/Cr 12.13 (baseline ~4). Transaminases/Tbili WNL, Alk Phos mildly elevated. Lipase 133. TSH 4.046. LA 6.1 > 0.8 after IV fluids. CK 198. Ammonia 79, Ethanol < 10, APAP/salicylates negative. UDS pending. VBG 6.974/pCO2 23.4/bicarb 5.4. PCT 29.14, MRSA PCR positive (nares). BCx pending, cefepime/vanc initiated. CT  Chest/A/P demonstrated bilateral centrilobular micronodules (tree-in-bud) greatest in bilateral lower lobes, small amount pneumomediastinum (history of esophageal perf), thick-walled nondistended urinary bladder with Foley, bilateral hydronephrosis with bilateral ureteral stents.   PCCM consulted for ICU admission. Nephro consulted for ARF and emergent CRRT initiation.  Past Medical History  CKD stage IV Bilateral ureteral obstruction status post stent placement April 2024 Cervical cancer stage IIb s/p chemotherapy and radiation Heroin use  Significant Hospital Events   12/16 - Presented to [facility] ED with unresponsiveness, decreased PO intake. Unresponsiveness with intermittent agitation requiring intubation. Found to be in acute renal failure with Cr 12 (baseline ~4), BUN > 100. WBC 46. Briefly required Levophed prior to intubation. Broad-spectrum antibiotic started (cefepime/vanc). RIJ Trialysis catheter placed. CRRT started per Nephro.  Interim history/subjective:  Tachycardic to 120s, off Levophed since 2100 12/16 Blood sugars between 70-100  Patient intubated with Kussmaul breathing, on bicarb drip and CRRT. Sedated on propofol and fentanyl.   Back on levophed this morning.  Objective   Blood pressure 95/68, pulse (!) 125, temperature 97.9 F (36.6 C), temperature source Axillary, resp. rate (!) 30, height 5\' 6"  (1.676 m), weight 68.8 kg, SpO2 98%.    Vent Mode: PRVC FiO2 (%):  [40 %-100 %] 40 % Set Rate:  [20 bmp-28 bmp] 28 bmp Vt Set:  [420 mL] 420 mL PEEP:  [5 cmH20] 5 cmH20 Plateau Pressure:  [14 cmH20] 14 cmH20   Intake/Output Summary (Last 24 hours) at 07/17/2023 0629 Last data filed at 07/17/2023 0600 Gross per 24 hour  Intake 293.98 ml  Output 301 ml  Net -7.02 ml   Filed Weights   07/16/23 1643 07/17/23 0413  Weight: 70.3 kg 68.8 kg    Examination: General:  In bed on vent, breathing over vent HENT: NCAT ETT in place PULM: CTA B, vent supported breathing CV:  RRR, no mgr GI: BS+, soft, nontender MSK: normal bulk and tone, dressing present to knee bilaterally, no crepitus appreciated on palpation Neuro: sedated on vent  Labs   CBC: WBC from 46.1 to 31.3 Hemoglobin from 8.8 to 8.2 Platelets at 530  Basic Metabolic Panel: Bicarb from 10 to 14 AG 18 BUN 133 K 4.3 Creatinine at 9.56.  Baseline appears to be around 4  ABG    Component Value Date/Time   PHART 6.935 (LL) 07/16/2023 2039   PCO2ART 49.6 (H) 07/16/2023 2039   PO2ART 497 (H) 07/16/2023 2039   HCO3 10.7 (L) 07/16/2023 2039   TCO2 12 (L) 07/16/2023 2039   ACIDBASEDEF 21.0 (H) 07/16/2023 2039   O2SAT 100 07/16/2023 2039    CXR 12/17: No consolidation or edema seen  CT abd/pelvis 12/16 IMPRESSION: 1. Bilateral centrilobular micro nodules and tree-in-bud opacities greatest in the bilateral lower lobes compatible with small airway infection/inflammation and/or aspiration. 2. Small amount of pneumomediastinum. 3. Thick-walled nondistended urinary bladder about a Foley catheter. Correlate with urinalysis to exclude cystitis. 4. Bilateral ureteral stents. Bilateral hydronephrosis is similar to 08/05/2021. 5. Similar prominent retroperitoneal lymph nodes and decreased inguinal lymph nodes.  Resolved Hospital Problem list   Septic shock  Assessment & Plan:  42 y.o. year old female with pmh of cervical cancer s/p chemoradiation complicated by ureteral stenosis with stent placement, heroin abuse who present altered mental status and admitted to ICU 12/16 for septic shock.  Septic shock Urosepsis? Bilateral lower extremity wounds History of bilateral ureteral obstruction Patient with retained stents presenting with sepsis with worsening of renal function. She is off of levophed since yesterday evening. COVID/Flu/RSV negative.  Urine culture 6/24 with >100,000 MRSA. CT abd/pelvis 12/16 showed thick walled non-distended bladder with bilateral hydronephrosis similar to 01/23.    CT also showed bilateral centrilobular micronodules and tree-in-bud opacities compatible with small airway infection or aspiration. CXR showed no consolidations or pneumothorax 12/17. Broad spectrum antibiotics were started with cefepime and vancomycin.   -continue cefepime and vancomycin -plan to Bronchoscopy with BAL for AFB and fungal cultures -Respiratory viral panel negative, getting full RVP -Blood/ Urine cultures pending -appreciate urology recommendations, discussed case with Sheria Lang this morning -strict I and Os  Acute renal failure CKD stage IV Severe metabolic acidosis Uremia Hyperkalemia Electrolytes improved this morning with bicarb of 14 and K at 4.3. BUN at 133.  -continue CRRT and bicarb drip per nephrology -trend BMP -strict I and Os -avoid nephrotoxic agents  Acute hypoxic respiratory failure Concern for aspiration pneumonitis Intubated for airway protection. ABG 12/16 with pH 6.9/ 49/497/10.   -Repeat ABG this morning, unable to obtain so will get VBG -Full vent support (4-8cc/kg IBWW) -wean FiO2 for O2 sat>90% -Daily WUA/SBT once appropriate from a mental status standpoint  -VAP bundle -PAD protocol: Propofol and fentanyl RASS -1 to -2 -triglyceride while on propofol  Small volume pneumomediastinum Small amount of pneumomediastinum seen on CT chest 12/16. Has history of esophageal perforation. Repeat CXR this AM without abnormabilities.  - Monitor closely for now -if worsens would consider repeating CT chest vs gastrografin study to evaluate for perforation  History of cervical cancer Stage IIb, status post chemoradiation diagnosed in 2011.  Followed with [facility]. Bertis Ruddy at [facility], last seen 3/23 with recommendations for CT abd/pelvis with 1/23 CT showing lymphadenopathy. CT 07/16/23 with similar prominent retroperitoneal and decreased inguinal lymph nodes.  -  continue outpatient follow-up  History of polysubstance abuse with current heroin use,  inhalation Husband endorse heroin use.  -UDS pending  Diabetes Blood sugars 70s-100s today.  -SSI  Best practice:  Diet: NPO Pain/Anxiety/Delirium protocol (if indicated): propofol, fentanyl DVT prophylaxis: Heparin GI prophylaxis: PPI Glucose control: SSI Code Status: Full Family Communication: husband at bedside Disposition: Guarded  Shivam Mestas M. Quantavius Humm, D.O.  Internal Medicine Resident, PGY-3 [facility]  Pager: [phone] 6:29 AM, 07/17/2023

## 2023-07-17 NOTE — Plan of Care (Signed)
  Problem: Safety: Goal: Non-violent Restraint(s) Outcome: Progressing   Problem: Education: Goal: Ability to describe self-care measures that may prevent or decrease complications (Diabetes Survival Skills Education) will improve Outcome: Progressing Goal: Individualized Educational Video(s) Outcome: Progressing   Problem: Coping: Goal: Ability to adjust to condition or change in health will improve Outcome: Progressing   Problem: Fluid Volume: Goal: Ability to maintain a balanced intake and output will improve Outcome: Progressing   Problem: Health Behavior/Discharge Planning: Goal: Ability to identify and utilize available resources and services will improve Outcome: Progressing Goal: Ability to manage health-related needs will improve Outcome: Progressing   Problem: Metabolic: Goal: Ability to maintain appropriate glucose levels will improve Outcome: Progressing   Problem: Nutritional: Goal: Maintenance of adequate nutrition will improve Outcome: Progressing Goal: Progress toward achieving an optimal weight will improve Outcome: Progressing   Problem: Skin Integrity: Goal: Risk for impaired skin integrity will decrease Outcome: Progressing   Problem: Tissue Perfusion: Goal: Adequacy of tissue perfusion will improve Outcome: Progressing   Problem: Education: Goal: Knowledge of General Education information will improve Description: Including pain rating scale, medication(s)/side effects and non-pharmacologic comfort measures Outcome: Progressing   Problem: Health Behavior/Discharge Planning: Goal: Ability to manage health-related needs will improve Outcome: Progressing   Problem: Clinical Measurements: Goal: Ability to maintain clinical measurements within normal limits will improve Outcome: Progressing Goal: Will remain free from infection Outcome: Progressing Goal: Diagnostic test results will improve Outcome: Progressing Goal: Respiratory complications  will improve Outcome: Progressing Goal: Cardiovascular complication will be avoided Outcome: Progressing   Problem: Activity: Goal: Risk for activity intolerance will decrease Outcome: Progressing   Problem: Nutrition: Goal: Adequate nutrition will be maintained Outcome: Progressing   Problem: Coping: Goal: Level of anxiety will decrease Outcome: Progressing   Problem: Elimination: Goal: Will not experience complications related to bowel motility Outcome: Progressing Goal: Will not experience complications related to urinary retention Outcome: Progressing   Problem: Pain Management: Goal: General experience of comfort will improve Outcome: Progressing   Problem: Safety: Goal: Ability to remain free from injury will improve Outcome: Progressing   Problem: Skin Integrity: Goal: Risk for impaired skin integrity will decrease Outcome: Progressing

## 2023-07-17 NOTE — Progress Notes (Signed)
Initial Nutrition Assessment  DOCUMENTATION CODES:  Severe malnutrition in context of social or environmental circumstances  INTERVENTION:  Initiate tube feeding via NG tube: Vital 1.5 at 50 ml/h (1200 ml per day) Start at trickle of 35mL/h and hold. If pt stable and tolerating, advance by 10mL q12h to goal of 50 Prosource TF20 60 ml BID Provides 1960 kcal, 121 gm protein, 917 ml free water daily Renal MVI Monitor magnesium and phosphorus every 12 hours x 4 occurrences, MD to replete as needed, as pt is at risk for refeeding syndrome given severe malnutrition.  NUTRITION DIAGNOSIS:  Severe Malnutrition related to social / environmental circumstances (drug abuse) as evidenced by severe muscle depletion, severe fat depletion.  GOAL:  Patient will meet greater than or equal to 90% of their needs  MONITOR:  TF tolerance, I & O's, Vent status, Labs, Weight trends  REASON FOR ASSESSMENT:  Consult Assessment of nutrition requirement/status (CRRT)  ASSESSMENT:  Pt with hx of cervical cancer (completed treatment 2011), bilateral ureteral obstruction (s/p stent placement 10/2022), CKD4, and polysubstance abuse presented to ED with abdominal pain and AMS for several days.  12/16 - presented to ED, intubated, CRRT initiated  Patient is currently intubated on ventilator support. No family at bedside at the time of assessment. Pt undergoing CRRT. NGT in place and noted to be gastric in imaging. Requiring some pressor support and propofol being utilized as sedative. Significant muscle and fat deficits on exam suggestive of long term poor nutrition status   Discussed in rounds, ok to start trickle feeds.  MV: 13.7 L/min Temp (24hrs), Avg:97.6 F (36.4 C), Min:96.7 F (35.9 C), Max:98.9 F (37.2 C) MAP (cuff): 75  Propofol: 10.6 ml/hr (280kcal/d at current infusion rate)  Admit weight: 70.3 kg  Current weight: 68.8 kg  ~21% weight loss noted in 2 years. Acutely, -3% in the last 6 months  which is not severe.   Intake/Output Summary (Last 24 hours) at 07/17/2023 1201 Last data filed at 07/17/2023 1200 Gross per 24 hour  Intake 800.53 ml  Output 300.9 ml  Net 499.63 ml  Net IO Since Admission: 499.63 mL [07/17/23 1201]  Drains/Lines: Temporary HD catheter, triple lumen, right IJ UOP x 24 hours  Nutritionally Relevant Medications: Scheduled Meds:  docusate  100 mg Per Tube BID   insulin aspart  0-9 Units Subcutaneous Q4H   pantoprazole IV  40 mg Intravenous QHS   polyethylene glycol  17 g Per Tube Daily   Continuous Infusions:  ceFEPime (MAXIPIME) IV Stopped (07/17/23 0631)   norepinephrine (LEVOPHED)  5 mcg/min (07/17/23 1000)   propofol (DIPRIVAN) infusion 25 mcg/kg/min (07/17/23 1000)   vancomycin     Labs Reviewed: BUN 133, creatinine 9.56 Phosphorus 8.1 CBG ranges from 57-108 mg/dL over the last 24 hours  NUTRITION - FOCUSED PHYSICAL EXAM: Flowsheet Row Most Recent Value  Orbital Region Severe depletion  Upper Arm Region Moderate depletion  Thoracic and Lumbar Region Mild depletion  Buccal Region Severe depletion  Temple Region Severe depletion  Clavicle Bone Region Moderate depletion  Clavicle and Acromion Bone Region Severe depletion  Scapular Bone Region Unable to assess  Dorsal Hand Unable to assess  [mittens]  Patellar Region Moderate depletion  Anterior Thigh Region Severe depletion  Posterior Calf Region Unable to assess  [both calves wrapped - wounds edema]  Edema (RD Assessment) Moderate  [BLE]  Hair Reviewed  [thinning]  Eyes Unable to assess  Mouth Reviewed  Skin Reviewed  Nails Unable to assess  [  mittens]    Diet Order:   Diet Order             Diet NPO time specified  Diet effective now                   EDUCATION NEEDS:  Not appropriate for education at this time  Skin:  Skin Assessment: Reviewed RN Assessment Multiple full thickness - bilateral legs  Last BM:  prior to admission  Height:  Ht Readings  from Last 1 Encounters:  07/16/23 5\' 6"  (1.676 m)    Weight:  Wt Readings from Last 1 Encounters:  07/17/23 68.8 kg    Ideal Body Weight:  59.1 kg  BMI:  Body mass index is 24.48 kg/m.  Estimated Nutritional Needs:  Kcal:  1900-2100 kcal/d Protein:  115-130g/d Fluid:  2L/d    Greig Castilla, RD, LDN Registered Dietitian II Please reach out via secure chat Weekend on-call pager # available in [facility]

## 2023-07-17 NOTE — Progress Notes (Signed)
PHARMACY - PHYSICIAN COMMUNICATION CRITICAL VALUE ALERT - BLOOD CULTURE IDENTIFICATION (BCID)  Marie Brewer is an 42 y.o. female who presented to [facility] on 07/16/2023 with a chief complaint of unresponsiveness  Assessment:  77 YOF with complex history including CKD 4 - recent stents 10/2012, cervical CA on chemo/radiation, PSA (inh heroin). Concern for sepsis on admission - possibly due to asp PNA, urinary, or B/L LE leg wounds. Now with 1 of 3 blood cultures growing GPC with BCID detecting streptococcus pyogenes.  Name of physician (or Provider) Contacted: CCM informed via rounding Rx, ID consult (Vu)  Current antibiotics: Vancomycin + Cefepime  Changes to prescribed antibiotics recommended:  Adjust Vancomycin to Linezolid to add toxin inhibition Keep Cefepime + Flagyl for now pending additional work-up and cultures  Results for orders placed or performed during the hospital encounter of 07/16/23  Blood Culture ID Panel (Reflexed) (Collected: 07/16/2023  4:50 PM)  Result Value Ref Range   Enterococcus faecalis NOT DETECTED NOT DETECTED   Enterococcus Faecium NOT DETECTED NOT DETECTED   Listeria monocytogenes NOT DETECTED NOT DETECTED   Staphylococcus species NOT DETECTED NOT DETECTED   Staphylococcus aureus (BCID) NOT DETECTED NOT DETECTED   Staphylococcus epidermidis NOT DETECTED NOT DETECTED   Staphylococcus lugdunensis NOT DETECTED NOT DETECTED   Streptococcus species DETECTED (A) NOT DETECTED   Streptococcus agalactiae NOT DETECTED NOT DETECTED   Streptococcus pneumoniae NOT DETECTED NOT DETECTED   Streptococcus pyogenes DETECTED (A) NOT DETECTED   A.calcoaceticus-baumannii NOT DETECTED NOT DETECTED   Bacteroides fragilis NOT DETECTED NOT DETECTED   Enterobacterales NOT DETECTED NOT DETECTED   Enterobacter cloacae complex NOT DETECTED NOT DETECTED   Escherichia coli NOT DETECTED NOT DETECTED   Klebsiella aerogenes NOT DETECTED NOT DETECTED   Klebsiella oxytoca NOT DETECTED  NOT DETECTED   Klebsiella pneumoniae NOT DETECTED NOT DETECTED   Proteus species NOT DETECTED NOT DETECTED   Salmonella species NOT DETECTED NOT DETECTED   Serratia marcescens NOT DETECTED NOT DETECTED   Haemophilus influenzae NOT DETECTED NOT DETECTED   Neisseria meningitidis NOT DETECTED NOT DETECTED   Pseudomonas aeruginosa NOT DETECTED NOT DETECTED   Stenotrophomonas maltophilia NOT DETECTED NOT DETECTED   Candida albicans NOT DETECTED NOT DETECTED   Candida auris NOT DETECTED NOT DETECTED   Candida glabrata NOT DETECTED NOT DETECTED   Candida krusei NOT DETECTED NOT DETECTED   Candida parapsilosis NOT DETECTED NOT DETECTED   Candida tropicalis NOT DETECTED NOT DETECTED   Cryptococcus neoformans/gattii NOT DETECTED NOT DETECTED    Thank you for allowing pharmacy to be a part of this patient's care.  Georgina Pillion, PharmD, BCPS, BCIDP Infectious Diseases Clinical Pharmacist 07/17/2023 1:34 PM   **Pharmacist phone directory can now be found on [email or web address] ([email or web address]).  Listed under [facility].

## 2023-07-17 NOTE — Consult Note (Signed)
[facility]    Date of Admission:  07/16/2023     Reason for Consult: septic shock, group a strep bacteremia    Referring Provider: Idamae Schuller     Lines:  12/16-c right internal jugular hd catheter   Abx: 12/17-c linezolid 12/16-c cefepime 12/17-c metronidazole   12/16 vanc        Assessment: 42 yo female with hx cervical cancer s/p chemoxrt and brachytherapy (unclear status of disease), presence of bilateral ureteral stent for distal ureteral stricture/obstructive uropathy (stents in for a year), ckd stage 4, admitted with acute ams, abd pain, hematuria, found to have:  Septic shock -- bp 42/16 on presentation cellulitis Group a strep bacteremia Hypoxic respiratory failure -- intubated on presentation in setting septic shock Ct imaging distended bladder and tree in bud bilateral basal lungs  AKI   12/16 mrsa nares pcr positive 12/17 ucx in progress 12/17 respiratory culture (BAL) in progress 12/16 bcx 1 of 2 set with group a strep by bcid  Source for group a strep might be lower ext open wound/cellulitis. No sign of nec fasc now but would need to closely monitor  Doesn't appear to have toxic shock syndrome at this time   She has chronic intermittent dysuria in setting of cervical cancer/xrt and bilateral ureteral stricture. No clinical change per history. Ct showed stable hydronephrosis  Bilateral basilar chest ct finding tree in bud ?aspiration. S/p BAL sampling   At this time covering broadly with abx     Plan: Agree with continuing bsAbx to include cefepime, linezolid, flagyl F/u final bcx report and respiratory cx  Supportive care per pulm ccm team Would leave legs dressing off to observe for sign cellulitis complication such as nec fasc I do not see need to do endocarditis w/u at this time Discuss with primary team     ------------------------------------------------ Principal Problem:    Sepsis due to urinary tract infection (HCC) Active Problems:   Acute respiratory failure (HCC)   Altered mental status    HPI: Marie Brewer is a 42 y.o. female  with hx cervical cancer s/p chemoxrt and brachytherapy (unclear status of disease), presence of bilateral ureteral stent for distal ureteral stricture/obstructive uropathy (stents in for a year), ckd stage 4, admitted with acute ams, abd pain, hematuria, found to have septic shock and group a strep bsi  Hx via chart/signout and her husband (present at bedside)  Her husband reports patient past 3 weeks feeling generalized weakness/poor appetite but really no definitive specific complaint  She has chronic intermittent dysuria and per husband at times would be in bathroom trying to urinate for a long time  Chronic open legs wound -- no other complaint but this admission picture shows redness around open wound  No cough, chest pain, headache, n/v/diarrhea, joint pain complaint   She has not f/u'ed oncology or urology care the past year. Unclear status of cervical cancer. Ureteral stents been in for a year   Patient altered at presentation and meeting septic shock criteria Afebrile Wbc 31 Ct abd pelv chest as below -- thickened bladder wall, stable hydronephrosis, stable lymph node swelling, bilateral chest tree in bud changes Bcx returned with group a strep Needing dialysis -- right internal jugular hd cath placed  Urology following  Intubated in setting septic shock and for airway protection   Family History  Problem Relation Age of Onset   Autism spectrum disorder Other  Social History   Tobacco Use   Smoking status: Some Days    Current packs/day: 0.25    Average packs/day: 0.3 packs/day for 10.0 years (2.5 ttl pk-yrs)    Types: Cigarettes   Smokeless tobacco: Never  Vaping Use   Vaping status: Every Day   Substances: Nicotine  Substance Use Topics   Alcohol use: No   Drug use: Not Currently    Types:  Heroin    Allergies  Allergen Reactions   Imitrex [Sumatriptan Base] Palpitations    Heart races   Cephalexin Rash    Patient passed amoxicillin challenge on 08/18/20, no adverse effect    Review of Systems: ROS All Other ROS was negative, except mentioned above   Past Medical History:  Diagnosis Date   Anemia    Bilateral ureteral obstruction    Cancer (HCC)    Chronic kidney disease    History of blood transfusion 08/30/2021   1 unit   History of cancer chemotherapy    completed 06/ 2011 for cervical cancer   History of cervical cancer 08/2009   followed by [facility] cancer center;  dx SCC Stage IIB,  completed chemoradiation 06/ 2011   History of radiation therapy    completed 06/ 2011 ffor cervical cancer   Migraine headache    Polysubstance abuse (HCC) 05/05/2020   heroin       Scheduled Meds:  Chlorhexidine Gluconate Cloth  6 each Topical Daily   Chlorhexidine Gluconate Cloth  6 each Topical Q0600   docusate  100 mg Per Tube BID   etomidate  0.3 mg/kg Intravenous Once   feeding supplement (PROSource TF20)  60 mL Per Tube BID   heparin  5,000 Units Subcutaneous Q8H   insulin aspart  0-9 Units Subcutaneous Q4H   multivitamin  1 tablet Per Tube QHS   mupirocin ointment  1 Application Nasal BID   mouth rinse  15 mL Mouth Rinse Q2H   pantoprazole (PROTONIX) IV  40 mg Intravenous QHS   polyethylene glycol  17 g Per Tube Daily   rocuronium  1 mg/kg Intravenous Once   Continuous Infusions:   prismasol BGK 4/2.5 800 mL/hr at 07/17/23 0942    prismasol BGK 4/2.5 400 mL/hr at 07/17/23 0942   sodium chloride 10 mL/hr at 07/17/23 1300   ceFEPime (MAXIPIME) IV Stopped (07/17/23 0631)   feeding supplement (VITAL 1.5 CAL) 20 mL/hr at 07/17/23 1300   fentaNYL infusion INTRAVENOUS 200 mcg/hr (07/17/23 1300)   heparin 10,000 units/ 20 mL infusion syringe 550 Units/hr (07/17/23 0930)   norepinephrine (LEVOPHED) Adult infusion 3 mcg/min (07/17/23 1300)   prismasol BGK 4/2.5  2,000 mL/hr at 07/17/23 1205   propofol (DIPRIVAN) infusion 35 mcg/kg/min (07/17/23 1300)   vancomycin     PRN Meds:.sodium chloride, fentaNYL, heparin, heparin, midazolam, mouth rinse   OBJECTIVE: Blood pressure 93/67, pulse (!) 117, temperature 98.2 F (36.8 C), temperature source Axillary, resp. rate (!) 27, height 5\' 6"  (1.676 m), weight 68.8 kg, SpO2 99%.  Physical Exam  General/constitutional: chronically ill appearing; sedated/intubated; on crrt HEENT: Normocephalic, PER, Conj Clear, edentulous Neck supple CV: rrr no mrg Lungs: clear to auscultation Abd: Soft Ext: no edema Skin: reviewed picture and discussed with primary team --> small stage 3 wounds with scabbing and some pustules on bilateral lower legs -- bilateral distal lower ext erythema as well. When I visited in dressing that is clean/dry Neuro: sedated/comatose MSK: no peripheral joint swelling/warmth   Lab Results Lab Results  Component Value  Date   WBC 31.3 (H) 07/17/2023   HGB 8.8 (L) 07/17/2023   HCT 26.0 (L) 07/17/2023   MCV 88.7 07/17/2023   PLT 530 (H) 07/17/2023    Lab Results  Component Value Date   CREATININE 9.56 (H) 07/17/2023   BUN 133 (H) 07/17/2023   NA 139 07/17/2023   K 3.5 07/17/2023   CL 106 07/17/2023   CO2 14 (L) 07/17/2023    Lab Results  Component Value Date   ALT 15 07/16/2023   AST 24 07/16/2023   ALKPHOS 127 (H) 07/16/2023   BILITOT 0.8 07/16/2023      Microbiology: Recent Results (from the past 240 hours)  Resp panel by RT-PCR (RSV, Flu A&B, Covid) Anterior Nasal Swab     Status: None   Collection Time: 07/16/23  4:49 PM   Specimen: Anterior Nasal Swab  Result Value Ref Range Status   SARS Coronavirus 2 by RT PCR NEGATIVE NEGATIVE Final   Influenza A by PCR NEGATIVE NEGATIVE Final   Influenza B by PCR NEGATIVE NEGATIVE Final    Comment: (NOTE) The Xpert Xpress SARS-CoV-2/FLU/RSV plus assay is intended as an aid in the diagnosis of influenza from Nasopharyngeal  swab specimens and should not be used as a sole basis for treatment. Nasal washings and aspirates are unacceptable for Xpert Xpress SARS-CoV-2/FLU/RSV testing.  Fact Sheet for Patients: BloggerCourse.com  Fact Sheet for Healthcare Providers: SeriousBroker.it  This test is not yet approved or cleared by the [place] FDA and has been authorized for detection and/or diagnosis of SARS-CoV-2 by FDA under an Emergency Use Authorization (EUA). This EUA will remain in effect (meaning this test can be used) for the duration of the COVID-19 declaration under Section 564(b)(1) of the Act, 21 U.S.C. section 360bbb-3(b)(1), unless the authorization is terminated or revoked.     Resp Syncytial Virus by PCR NEGATIVE NEGATIVE Final    Comment: (NOTE) Fact Sheet for Patients: BloggerCourse.com  Fact Sheet for Healthcare Providers: SeriousBroker.it  This test is not yet approved or cleared by the [place] FDA and has been authorized for detection and/or diagnosis of SARS-CoV-2 by FDA under an Emergency Use Authorization (EUA). This EUA will remain in effect (meaning this test can be used) for the duration of the COVID-19 declaration under Section 564(b)(1) of the Act, 21 U.S.C. section 360bbb-3(b)(1), unless the authorization is terminated or revoked.  Performed at [facility], [address] [place], [place] [address]   Blood culture (routine x 2)     Status: None (Preliminary result)   Collection Time: 07/16/23  4:50 PM   Specimen: BLOOD  Result Value Ref Range Status   Specimen Description BLOOD SITE NOT SPECIFIED  Final   Special Requests   Final    BOTTLES DRAWN AEROBIC ONLY Blood Culture adequate volume   Culture  Setup Time   Final    GRAM POSITIVE COCCI IN CHAINS ANAEROBIC BOTTLE ONLY CRITICAL RESULT CALLED TO, READ BACK BY AND VERIFIED WITH: Graciela Husbands  [id number] FCP Performed at [facility], [address] [place], [place] [address]    Culture GRAM POSITIVE COCCI  Final   Report Status PENDING  Incomplete  Blood Culture ID Panel (Reflexed)     Status: Abnormal   Collection Time: 07/16/23  4:50 PM  Result Value Ref Range Status   Enterococcus faecalis NOT DETECTED NOT DETECTED Final   Enterococcus Faecium NOT DETECTED NOT DETECTED Final   Listeria monocytogenes NOT DETECTED NOT  DETECTED Final   Staphylococcus species NOT DETECTED NOT DETECTED Final   Staphylococcus aureus (BCID) NOT DETECTED NOT DETECTED Final   Staphylococcus epidermidis NOT DETECTED NOT DETECTED Final   Staphylococcus lugdunensis NOT DETECTED NOT DETECTED Final   Streptococcus species DETECTED (A) NOT DETECTED Final    Comment: CRITICAL RESULT CALLED TO, READ BACK BY AND VERIFIED WITH: PHARMD ELIZABETH M. [id number] FCP    Streptococcus agalactiae NOT DETECTED NOT DETECTED Final   Streptococcus pneumoniae NOT DETECTED NOT DETECTED Final   Streptococcus pyogenes DETECTED (A) NOT DETECTED Final    Comment: CRITICAL RESULT CALLED TO, READ BACK BY AND VERIFIED WITH: PHARMD ELIZABETH M. [phone] [id number] FCP    A.calcoaceticus-baumannii NOT DETECTED NOT DETECTED Final   Bacteroides fragilis NOT DETECTED NOT DETECTED Final   Enterobacterales NOT DETECTED NOT DETECTED Final   Enterobacter cloacae complex NOT DETECTED NOT DETECTED Final   Escherichia coli NOT DETECTED NOT DETECTED Final   Klebsiella aerogenes NOT DETECTED NOT DETECTED Final   Klebsiella oxytoca NOT DETECTED NOT DETECTED Final   Klebsiella pneumoniae NOT DETECTED NOT DETECTED Final   Proteus species NOT DETECTED NOT DETECTED Final   Salmonella species NOT DETECTED NOT DETECTED Final   Serratia marcescens NOT DETECTED NOT DETECTED Final   Haemophilus influenzae NOT DETECTED NOT DETECTED Final   Neisseria meningitidis NOT DETECTED NOT DETECTED Final   Pseudomonas aeruginosa NOT DETECTED NOT  DETECTED Final   Stenotrophomonas maltophilia NOT DETECTED NOT DETECTED Final   Candida albicans NOT DETECTED NOT DETECTED Final   Candida auris NOT DETECTED NOT DETECTED Final   Candida glabrata NOT DETECTED NOT DETECTED Final   Candida krusei NOT DETECTED NOT DETECTED Final   Candida parapsilosis NOT DETECTED NOT DETECTED Final   Candida tropicalis NOT DETECTED NOT DETECTED Final   Cryptococcus neoformans/gattii NOT DETECTED NOT DETECTED Final    Comment: Performed at [facility], [address], [place], [place] [address]  MRSA Next Gen by PCR, Nasal     Status: Abnormal   Collection Time: 07/16/23  9:37 PM   Specimen: Nasal Mucosa; Nasal Swab  Result Value Ref Range Status   MRSA by PCR Next Gen DETECTED (A) NOT DETECTED Final    Comment: RESULT CALLED TO, READ BACK BY AND VERIFIED WITH: B SCOTT  RN 07/16/2023 @ 2325 BY AB (NOTE) The GeneXpert MRSA Assay (FDA approved for NASAL specimens only), is one component of a comprehensive MRSA colonization surveillance program. It is not intended to diagnose MRSA infection nor to guide or monitor treatment for MRSA infections. Test performance is not FDA approved in patients less than 72 years old. Performed at [facility], [address], [place], [place] [address]   Blood culture (routine x 2)     Status: None (Preliminary result)   Collection Time: 07/16/23 10:36 PM   Specimen: BLOOD RIGHT HAND  Result Value Ref Range Status   Specimen Description BLOOD RIGHT HAND  Final   Special Requests   Final    BOTTLES DRAWN AEROBIC AND ANAEROBIC Blood Culture results may not be optimal due to an inadequate volume of blood received in culture bottles   Culture   Final    NO GROWTH < 12 HOURS Performed at [facility], [address], [place], [place] [address]    Report Status PENDING  Incomplete     Serology:    Imaging: If present, new imagings (plain films, ct scans, and mri) have been personally visualized and  interpreted; radiology reports have been reviewed. Decision making incorporated into the Impression / Recommendations.   07/17/23 cxr IMPRESSION: 1. Satisfactory position of endotracheal tube. 2. No acute cardiopulmonary abnormalities.   12/16 head ct 1. No acute intracranial abnormality. 2. Diffuse, moderately severe paranasal sinus disease.  12/16 ct abd pelv chest 1. Bilateral centrilobular micro nodules and tree-in-bud opacities greatest in the bilateral lower lobes compatible with small airway infection/inflammation and/or aspiration. 2. Small amount of pneumomediastinum. 3. Thick-walled nondistended urinary bladder about a Foley catheter. Correlate with urinalysis to exclude cystitis. 4. Bilateral ureteral stents. Bilateral hydronephrosis is similar to 08/05/2021. 5. Similar prominent retroperitoneal lymph nodes and decreased inguinal lymph nodes.    Raymondo Band, MD [facility] [phone] pager    07/17/2023, 1:12 PM

## 2023-07-17 NOTE — Procedures (Signed)
Interventional Radiology Procedure Note  Procedure:  1) Bilateral percutaneous nephrostomy tube placement 2) Bilateral antegrade, percutaneous ureteral stent removal  Findings: Please refer to procedural dictation for full description. Stents removed bilaterally, percutaneously.  Bilateral 10 Fr percutaneous nephrostomy tubes placed.  Complications: None immediate  Estimated Blood Loss: < 5 ml  Recommendations: Keep to bag drainage. IR will follow.   Marliss Coots, MD

## 2023-07-17 NOTE — Progress Notes (Signed)
PHARMACY - PHYSICIAN COMMUNICATION CRITICAL VALUE ALERT - BLOOD CULTURE IDENTIFICATION (BCID)  Marie Brewer is an 42 y.o. female who presented to [facility] on 07/16/2023 with a chief complaint of sepsis, presumed urine source.   Assessment: Prior blood cultures from 12/16 with Streptococcus.  New blood cultures with MRSE. Patient on Vancomycin, Cefepime, Linezolid, and Flagyl.   Name of physician (or Provider) Contacted: Dr. Katrinka Blazing  Current antibiotics: Patient on Vancomycin, Cefepime, Linezolid, and Flagyl.    Changes to prescribed antibiotics recommended:  Patient is on recommended antibiotics - No changes needed  Results for orders placed or performed during the hospital encounter of 07/16/23  Blood Culture ID Panel (Reflexed) (Collected: 07/17/2023  6:57 PM)  Result Value Ref Range   Enterococcus faecalis NOT DETECTED NOT DETECTED   Enterococcus Faecium NOT DETECTED NOT DETECTED   Listeria monocytogenes NOT DETECTED NOT DETECTED   Staphylococcus species DETECTED (A) NOT DETECTED   Staphylococcus aureus (BCID) NOT DETECTED NOT DETECTED   Staphylococcus epidermidis DETECTED (A) NOT DETECTED   Staphylococcus lugdunensis NOT DETECTED NOT DETECTED   Streptococcus species NOT DETECTED NOT DETECTED   Streptococcus agalactiae NOT DETECTED NOT DETECTED   Streptococcus pneumoniae NOT DETECTED NOT DETECTED   Streptococcus pyogenes NOT DETECTED NOT DETECTED   A.calcoaceticus-baumannii NOT DETECTED NOT DETECTED   Bacteroides fragilis NOT DETECTED NOT DETECTED   Enterobacterales NOT DETECTED NOT DETECTED   Enterobacter cloacae complex NOT DETECTED NOT DETECTED   Escherichia coli NOT DETECTED NOT DETECTED   Klebsiella aerogenes NOT DETECTED NOT DETECTED   Klebsiella oxytoca NOT DETECTED NOT DETECTED   Klebsiella pneumoniae NOT DETECTED NOT DETECTED   Proteus species NOT DETECTED NOT DETECTED   Salmonella species NOT DETECTED NOT DETECTED   Serratia marcescens NOT DETECTED NOT DETECTED    Haemophilus influenzae NOT DETECTED NOT DETECTED   Neisseria meningitidis NOT DETECTED NOT DETECTED   Pseudomonas aeruginosa NOT DETECTED NOT DETECTED   Stenotrophomonas maltophilia NOT DETECTED NOT DETECTED   Candida albicans NOT DETECTED NOT DETECTED   Candida auris NOT DETECTED NOT DETECTED   Candida glabrata NOT DETECTED NOT DETECTED   Candida krusei NOT DETECTED NOT DETECTED   Candida parapsilosis NOT DETECTED NOT DETECTED   Candida tropicalis NOT DETECTED NOT DETECTED   Cryptococcus neoformans/gattii NOT DETECTED NOT DETECTED   Methicillin resistance mecA/C DETECTED (A) NOT DETECTED    Fayne Norrie 07/17/2023  5:24 PM

## 2023-07-17 NOTE — Procedures (Signed)
Admit: 07/16/2023 LOS: 1  23F with chronic obstructive uropathy with b/l stents, hx/o cervical CA, CKD4 here with AKI, metabolic acidosis, hyperkalemia, AMS, VDRF, septic shock from ?UTI  Current CRRT Prescription: Start Date: 07/17/23 Catheter: R internal jugular Temp HD cath placed 07/16/23 CCM BFR: 250 Pre Blood Pump: 122mL/h NaHCO3 gtt DFR: 1500 2K Replacement Rate: 125 NaHCO3 gtt Goal UF: open/positive Anticoagulation: none Clotting: First event < 12h from start  S: D/w RN and primary MD overnight issues and plan Clotting off < 12h from start HCO3 up to 14, K down to 4.3 Very little UOP On low dose NE P 8.1  O: 12/16 0701 - 12/17 0700 In: 433 [I.V.:313; IV Piggyback:100] Out: 300.9 [Urine:285]  Filed Weights   07/16/23 1643 07/17/23 0413  Weight: 70.3 kg 68.8 kg    Recent Labs  Lab 07/16/23 1652 07/16/23 1658 07/16/23 2130 07/17/23 0321 07/17/23 0322 07/17/23 1016  NA 130*   < > 134*  --  138 139  K 5.7*   < > 6.0*  --  4.3 3.5  CL 101  --  109  --  106  --   CO2 <7*  --  10*  --  14*  --   GLUCOSE 129*  --  120*  --  98  --   BUN 158*  --  150*  --  133*  --   CREATININE 12.13*  --  11.27*  --  9.56*  --   CALCIUM 10.0  --  8.3*  --  7.5*  --   PHOS  --   --   --  8.1* 8.1*  --    < > = values in this interval not displayed.   Recent Labs  Lab 07/16/23 1652 07/16/23 1658 07/16/23 2039 07/17/23 0455 07/17/23 1016  WBC 46.1*  --   --  31.3*  --   NEUTROABS 42.0*  --   --   --   --   HGB 10.8*   < > 8.8* 8.2* 8.8*  HCT 36.0   < > 26.0* 25.0* 26.0*  MCV 95.2  --   --  88.7  --   PLT 704*  --   --  530*  --    < > = values in this interval not displayed.    Scheduled Meds:  Chlorhexidine Gluconate Cloth  6 each Topical Daily   Chlorhexidine Gluconate Cloth  6 each Topical Q0600   docusate  100 mg Per Tube BID   etomidate  0.3 mg/kg Intravenous Once   heparin  5,000 Units Subcutaneous Q8H   insulin aspart  0-9 Units Subcutaneous Q4H    mupirocin ointment  1 Application Nasal BID   mouth rinse  15 mL Mouth Rinse Q2H   pantoprazole (PROTONIX) IV  40 mg Intravenous QHS   polyethylene glycol  17 g Per Tube Daily   rocuronium  1 mg/kg Intravenous Once   Continuous Infusions:   prismasol BGK 4/2.5 800 mL/hr at 07/17/23 0942    prismasol BGK 4/2.5 400 mL/hr at 07/17/23 0942   sodium chloride 10 mL/hr at 07/17/23 1000   ceFEPime (MAXIPIME) IV Stopped (07/17/23 0631)   fentaNYL infusion INTRAVENOUS 150 mcg/hr (07/17/23 1000)   heparin 10,000 units/ 20 mL infusion syringe 550 Units/hr (07/17/23 0930)   norepinephrine (LEVOPHED) Adult infusion 5 mcg/min (07/17/23 1000)   prismasol BGK 4/2.5 2,000 mL/hr at 07/17/23 0942   propofol (DIPRIVAN) infusion 25 mcg/kg/min (07/17/23 1029)   vancomycin  PRN Meds:.sodium chloride, fentaNYL, heparin, heparin, midazolam, mouth rinse  ABG    Component Value Date/Time   PHART 6.935 (LL) 07/16/2023 2039   PCO2ART 49.6 (H) 07/16/2023 2039   PO2ART 497 (H) 07/16/2023 2039   HCO3 23.7 07/17/2023 1016   TCO2 25 07/17/2023 1016   ACIDBASEDEF 3.0 (H) 07/17/2023 1016   O2SAT 79 07/17/2023 1016    A Dialysis dependent AKI on CKD4 w/ hx/o obstructive uropathy Septic shock likely from UTI with b/l ureteral stents not exchanged on schedule Hyperkalemia, improved Metabolic acidosis, improving Anemia  P Change to all 4K and inc flow settings Net even UF Add fixed dose heparin  Arita Miss, MD  [employer]

## 2023-07-17 NOTE — TOC Initial Note (Signed)
Transition of Care [facility]) - Initial/Assessment Note    Patient Details  Name: Marie Brewer MRN: [id number] Date of Birth: [birth year]  Transition of [facility]) CM/SW Contact:    Lawerance Sabal, RN Phone Number: 07/17/2023, 2:27 PM  Clinical Narrative:                  Patient admitted from home w spouse w respiratory faulir, septic shock, AKI, encephalopathy. Intubated, CVVHD, bronch, IV Abx.  Hx polysubstance abuse, and no primary care.  TOC will address needs when patient becomes more coherent.    Barriers to Discharge: Continued Medical Work up   Patient Goals and CMS Choice Patient states their goals for this hospitalization and ongoing recovery are:: pt unable to answer at this time          Expected Discharge Plan and Services       Living arrangements for the past 2 months: Apartment                                      Prior Living Arrangements/Services Living arrangements for the past 2 months: Apartment Lives with:: Spouse                   Activities of Daily Living      Permission Sought/Granted                  Emotional Assessment              Admission diagnosis:  Sepsis due to urinary tract infection (HCC) [A41.9, N39.0] Altered mental status, unspecified altered mental status type [R41.82] Patient Active Problem List   Diagnosis Date Noted   Acute respiratory failure (HCC) 07/17/2023   Altered mental status 07/17/2023   Sepsis due to urinary tract infection (HCC) 07/16/2023   UTI (urinary tract infection) 08/30/2021   Cystitis 08/30/2021   Anemia, unspecified    Deficiency anemia    ARF (acute renal failure) (HCC) 08/14/2020   Acute renal failure (ARF) (HCC) 08/14/2020   Cellulitis 05/08/2020   Edema of right foot 05/08/2020   AKI (acute kidney injury) (HCC) 05/08/2020   Sepsis (HCC) 05/06/2020   Sepsis due to cellulitis (HCC) 05/05/2020   Polysubstance abuse (HCC) 05/05/2020   Hypokalemia 05/05/2020    Hyponatremia 05/05/2020   Cervical ca (HCC) 07/30/2013   PCP:  Oneita Hurt, No Pharmacy:   [facility] [phone] - [place], [place] - [address] AT [address] [place] [address] Phone: [phone] Fax: [phone]     Social Drivers of Health (SDOH) Social History: SDOH Screenings   Tobacco Use: High Risk (07/16/2023)   SDOH Interventions:     Readmission Risk Interventions     No data to display

## 2023-07-17 NOTE — Progress Notes (Signed)
Opened in error

## 2023-07-17 NOTE — H&P (Signed)
Chief Complaint: Patient was seen in consultation today for bilateral nephrostomy tube placement with possible ureteral stent removal   at the request of Elmon Kirschner, NP.  Referring Physician(s): Elmon Kirschner, NP.  Supervising Physician: Marliss Coots  Patient Status: [facility] - In-pt  Full Code  History of Present Illness: Marie Brewer is a 42 y.o. female with history of CKD stage 4, bilateral ureteral obstruction-stents placed, cervical cancer, and substance abuse. Patient presented to the ER 12/16 with decreased responsiveness, abdominal pain and urinary symptoms. Patient was intubated in the ED. CT scan demonstrated bilateral hydronephrosis with bilateral ureteral stents. Patient was transferred to the ICU   Patient is currently intubated and on CRRT. She is unable to consent. Husband is at the bedside and provided consent for the procedure today. History and physical exam are limited due to patient being intubated.   Past Medical History:  Diagnosis Date   Anemia    Bilateral ureteral obstruction    Cancer (HCC)    Chronic kidney disease    History of blood transfusion 08/30/2021   1 unit   History of cancer chemotherapy    completed 06/ 2011 for cervical cancer   History of cervical cancer 08/2009   followed by [facility];  dx SCC Stage IIB,  completed chemoradiation 06/ 2011   History of radiation therapy    completed 06/ 2011 ffor cervical cancer   Migraine headache    Polysubstance abuse (HCC) 05/05/2020   heroin    Past Surgical History:  Procedure Laterality Date   CHOLECYSTECTOMY     CYSTOSCOPY W/ URETERAL STENT PLACEMENT Bilateral 08/15/2020   Procedure: CYSTOSCOPY WITH RETROGRADE PYELOGRAM/URETERAL STENT PLACEMENT;  Surgeon: Rene Paci, MD;  Location: [facility] OR;  Service: Urology;  Laterality: Bilateral;   CYSTOSCOPY W/ URETERAL STENT PLACEMENT Bilateral 09/07/2021   Procedure: CYSTOSCOPY WITH RETROGRADE PYELOGRAM/URETERAL  STENT EXCHANGE;  Surgeon: Rene Paci, MD;  Location: [facility] ORS;  Service: Urology;  Laterality: Bilateral;   CYSTOSCOPY W/ URETERAL STENT PLACEMENT Bilateral 04/19/2022   Procedure: CYSTOSCOPY WITH STENT EXCHANGE;  Surgeon: Rene Paci, MD;  Location: [facility];  Service: Urology;  Laterality: Bilateral;   CYSTOSCOPY WITH STENT PLACEMENT Bilateral 11/15/2022   Procedure: CYSTOSCOPY WITH BILATERAL STENT EXCHANGE;  Surgeon: Rene Paci, MD;  Location: [facility];  Service: Urology;  Laterality: Bilateral;   INCISION AND DRAINAGE ABSCESS  04/09/2003   @[facility]  ;  RIGHT SUBMANDIBULAR ABSCESS (LARGE) AND EXTRACTION MULTIPLE TEETH   IRRIGATION AND DEBRIDEMENT ABSCESS Bilateral 05/05/2020   Procedure: left forearm incision and drainage and aspiration of the right wrist joint;  Surgeon: Allena Napoleon, MD;  Location: [facility];  Service: Plastics;  Laterality: Bilateral;    Allergies: Imitrex [sumatriptan base] and Cephalexin  Medications: Prior to Admission medications   Medication Sig Start Date End Date Taking? Authorizing Provider  ciprofloxacin (CIPRO) 500 MG tablet Take 1 tablet (500 mg total) by mouth daily. 01/26/23   Raspet, Noberto Retort, PA-C  ferrous sulfate 325 (65 FE) MG tablet Take 325 mg by mouth daily.    [provider]  Multiple Vitamin (MULTIVITAMIN WITH MINERALS) TABS tablet Take 1 tablet by mouth daily.    [provider]  oxybutynin (DITROPAN) 5 MG tablet Take 5 mg by mouth every 8 (eight) hours as needed for bladder spasms.    [provider]  sulfamethoxazole-trimethoprim (BACTRIM) 400-80 MG tablet Take 1 tablet by mouth 2 (two) times daily. Take 2 tablets for first dose.  Then take one tablet every 12 hours for 5 days. 01/30/23   Lamptey, Britta Mccreedy, MD     Family History  Problem Relation Age of Onset   Autism spectrum disorder Other     Social History   Socioeconomic History   Marital status: Legally  Separated    Spouse name: Not on file   Number of children: Not on file   Years of education: Not on file   Highest education level: Not on file  Occupational History   Not on file  Tobacco Use   Smoking status: Some Days    Current packs/day: 0.25    Average packs/day: 0.3 packs/day for 10.0 years (2.5 ttl pk-yrs)    Types: Cigarettes   Smokeless tobacco: Never  Vaping Use   Vaping status: Every Day   Substances: Nicotine  Substance and Sexual Activity   Alcohol use: No   Drug use: Not Currently    Types: Heroin   Sexual activity: Not on file  Other Topics Concern   Not on file  Social History Narrative   Not on file   Social Drivers of Health   Financial Resource Strain: Not on file  Food Insecurity: Not on file  Transportation Needs: Not on file  Physical Activity: Not on file  Stress: Not on file  Social Connections: Not on file    Review of Systems: A 12 point ROS discussed and pertinent positives are indicated in the HPI above.  All other systems are negative.  Review of Systems  Reason unable to perform ROS: intubated patient.    Vital Signs: BP 97/66   Pulse (!) 108   Temp 98.2 F (36.8 C) (Axillary)   Resp (!) 30   Ht 5\' 6"  (1.676 m)   Wt 151 lb 10.8 oz (68.8 kg)   SpO2 94%   BMI 24.48 kg/m     Physical Exam Vitals reviewed.  Constitutional:      Appearance: She is normal weight.  HENT:     Head: Normocephalic.     Mouth/Throat:     Comments: Endotracheal tube in place. Abdominal:     General: Abdomen is flat.  Neurological:     Comments: Unable to assess due to the patient being intubated.      Imaging: DG Chest Port 1 View Result Date: 07/17/2023 CLINICAL DATA:  Evaluate ET tube placement EXAM: PORTABLE CHEST 1 VIEW COMPARISON:  07/16/2023 FINDINGS: The endotracheal tube tip is above the carina. There is a right IJ catheter with tip in the superior cavoatrial junction. Enteric tube tip and side port are below the GE junction. Stable  cardiomediastinal contours. No pleural fluid, interstitial edema or airspace disease. IMPRESSION: 1. Satisfactory position of endotracheal tube. 2. No acute cardiopulmonary abnormalities. Electronically Signed   By: Signa Kell M.D.   On: 07/17/2023 07:40   DG CHEST PORT 1 VIEW Result Date: 07/17/2023 CLINICAL DATA:  Central line EXAM: PORTABLE CHEST 1 VIEW COMPARISON:  Chest x-ray 07/16/2023 FINDINGS: Endotracheal tube tip is 2.4 cm above the carina. New right-sided central venous catheter tip ends in the SVC. Enteric tube extends below the diaphragm. The heart size and mediastinal contours are within normal limits. Both lungs are clear. There is no pleural effusion or pneumothorax. The visualized skeletal structures are unremarkable. IMPRESSION: New right-sided central venous catheter tip ends in the SVC. No pneumothorax. Electronically Signed   By: Darliss Cheney M.D.   On: 07/17/2023 01:11   CT CHEST ABDOMEN PELVIS WO CONTRAST  Result Date: 07/16/2023 CLINICAL DATA:  Sepsis, delirium EXAM: CT CHEST, ABDOMEN AND PELVIS WITHOUT CONTRAST TECHNIQUE: Multidetector CT imaging of the chest, abdomen and pelvis was performed following the standard protocol without IV contrast. RADIATION DOSE REDUCTION: This exam was performed according to the departmental dose-optimization program which includes automated exposure control, adjustment of the mA and/or kV according to patient size and/or use of iterative reconstruction technique. COMPARISON:  Chest radiograph 07/16/2023; CT abdomen and pelvis 08/05/2021 and CT chest 08/18/2020 FINDINGS: CT CHEST FINDINGS Cardiovascular: Normal heart size.  No pericardial effusion. Mediastinum/Nodes: Endotracheal tube tip in the intrathoracic trachea. Enteric tube tip in the stomach. Small amount of pneumomediastinum. No definite thoracic adenopathy Lungs/Pleura: Bilateral centrilobular micro nodules and tree-in-bud opacities greatest in the bilateral lower lobes compatible with  small airway infection/inflammation and/or aspiration. No pleural effusion or pneumothorax. Musculoskeletal: No acute fracture. CT ABDOMEN PELVIS FINDINGS Hepatobiliary: Unremarkable noncontrast appearance of the liver. Cholecystectomy. No biliary dilation. Pancreas: Unremarkable. Spleen: Unremarkable. Adrenals/Urinary Tract: Stable adrenal glands. Bilateral ureteral stents. Bilateral hydronephrosis is similar to 08/05/2021. Thick-walled nondistended urinary bladder about a Foley catheter. Locules of gas in the bladder. Stomach/Bowel: No bowel obstruction or bowel wall thickening. The appendix is normal. Stomach is within normal limits. Vascular/Lymphatic: Similar prominent retroperitoneal lymph nodes. For example, 1.0 cm left para-aortic lymph node. Bilateral inguinal adenopathy measuring 1.7 cm on the left and 1.8 cm on the right is similar to decreased compared to 08/05/2021. No new or enlarging thoracic adenopathy. Unremarkable noncontrast appearance of the vasculature. Reproductive: Unremarkable. Other: Small volume free fluid in the pelvis. No free intraperitoneal air. Musculoskeletal: No acute fracture. IMPRESSION: 1. Bilateral centrilobular micro nodules and tree-in-bud opacities greatest in the bilateral lower lobes compatible with small airway infection/inflammation and/or aspiration. 2. Small amount of pneumomediastinum. 3. Thick-walled nondistended urinary bladder about a Foley catheter. Correlate with urinalysis to exclude cystitis. 4. Bilateral ureteral stents. Bilateral hydronephrosis is similar to 08/05/2021. 5. Similar prominent retroperitoneal lymph nodes and decreased inguinal lymph nodes. Electronically Signed   By: Minerva Fester M.D.   On: 07/16/2023 22:13   CT Head Wo Contrast Result Date: 07/16/2023 CLINICAL DATA:  Delirium and sepsis EXAM: CT HEAD WITHOUT CONTRAST TECHNIQUE: Contiguous axial images were obtained from the base of the skull through the vertex without intravenous contrast.  RADIATION DOSE REDUCTION: This exam was performed according to the departmental dose-optimization program which includes automated exposure control, adjustment of the mA and/or kV according to patient size and/or use of iterative reconstruction technique. COMPARISON:  None Available. FINDINGS: Brain: There is no mass, hemorrhage or extra-axial collection. Normal appearance of the white matter with preserved gray-white differentiation. Normal CSF spaces. Vascular: Negative Skull: Negative Sinuses/Orbits: Moderate paranasal sinus disease. Other: Endotracheal intubation IMPRESSION: 1. No acute intracranial abnormality. 2. Diffuse, moderately severe paranasal sinus disease. Electronically Signed   By: Deatra Robinson M.D.   On: 07/16/2023 21:13   DG Chest Portable 1 View Result Date: 07/16/2023 CLINICAL DATA:  Shortness of breath: ETT placement EXAM: PORTABLE CHEST 1 VIEW COMPARISON:  Same day chest radiograph FINDINGS: Stable cardiomediastinal silhouette. Aortic atherosclerotic calcification. No focal consolidation, pleural effusion, or pneumothorax. No displaced rib fractures. Subdiaphragmatic enteric tube with side port in the stomach. Endotracheal tube tip in the mid intrathoracic trachea proximally cm from the carina. IMPRESSION: Endotracheal tube tip in the mid intrathoracic trachea. Subdiaphragmatic enteric tube. Otherwise no change from radiographs earlier today. Electronically Signed   By: Minerva Fester M.D.   On: 07/16/2023 20:57   DG Chest  1 View Result Date: 07/16/2023 CLINICAL DATA:  Reason for exam: Patient order states screening. Best obtainable images due to patient cooperation and condition. EXAM: CHEST  1 VIEW COMPARISON:  11/24/2007 radiographic and 08/18/2020 CT chest FINDINGS: Normal cardiomediastinal silhouette. No focal consolidation, pleural effusion, or pneumothorax. No displaced rib fractures. IMPRESSION: No acute cardiopulmonary disease. Electronically Signed   By: Minerva Fester M.D.    On: 07/16/2023 20:54    Labs:  CBC: Recent Labs    01/26/23 1550 07/16/23 1652 07/16/23 1658 07/16/23 2039 07/17/23 0455 07/17/23 1016  WBC 7.5 46.1*  --   --  31.3*  --   HGB 6.8* 10.8* 13.6 8.8* 8.2* 8.8*  HCT 21.9* 36.0 40.0 26.0* 25.0* 26.0*  PLT 307 704*  --   --  530*  --     COAGS: Recent Labs    07/17/23 0321  APTT 27    BMP: Recent Labs    01/26/23 1550 07/16/23 1652 07/16/23 1658 07/16/23 2039 07/16/23 2130 07/17/23 0322 07/17/23 1016  NA 132* 130*   < > 137 134* 138 139  K 3.5 5.7*   < > 5.5* 6.0* 4.3 3.5  CL 102 101  --   --  109 106  --   CO2 20* <7*  --   --  10* 14*  --   GLUCOSE 94 129*  --   --  120* 98  --   BUN 52* 158*  --   --  150* 133*  --   CALCIUM 8.6* 10.0  --   --  8.3* 7.5*  --   CREATININE 4.05* 12.13*  --   --  11.27* 9.56*  --   GFRNONAA 13* 4*  --   --  4* 5*  --    < > = values in this interval not displayed.    LIVER FUNCTION TESTS: Recent Labs    07/16/23 1652 07/17/23 0322  BILITOT 0.8  --   AST 24  --   ALT 15  --   ALKPHOS 127*  --   PROT 11.3*  --   ALBUMIN 2.7* 2.1*    TUMOR MARKERS: No results for input(s): "AFPTM", "CEA", "CA199", "CHROMGRNA" in the last 8760 hours.  Assessment and Plan: Urosepsis and hydronephrosis  Patient with urosepsis and hydronephrosis. After review by Dr. Andrey Campanile, patient is approved to undergo a bilateral nephrostomy tube placement with possible stent removal today with IR. Patient is NPO. INR ordered. Dr. Elby Showers is aware the patient is on heparin.  Risks and benefits of bilateral PCN placement with possible stent removal was discussed with the patient including, but not limited to, infection, bleeding, significant bleeding causing loss or decrease in renal function or damage to adjacent structures.   All of the patient's questions were answered, patient is agreeable to proceed.  Consent signed and in chart.  Thank you for this interesting consult.  I greatly enjoyed  meeting Shaquandra Vidovich and look forward to participating in their care.  A copy of this report was sent to the requesting provider on this date.  Electronically Signed: Rosalita Levan, PA 07/17/2023, 2:57 PM   I spent a total of 20 Minutes    in face to face in clinical consultation, greater than 50% of which was counseling/coordinating care for bilateral nephrostomy tube placement with possible stent removal.

## 2023-07-17 NOTE — Procedures (Signed)
Bronchoscopy Procedure Note  Marie Brewer  [id number]  13-Sep-1980  Date:07/17/23  Time:10:56 AM   Provider Performing:Shanteria Laye S Gualberto Wahlen   Procedure(s):   Diagnostic bronchoscopy with BAL  Indication(s) Acute respiratory failure, bilateral tree-in-bud infiltrates/nodules  Consent Risks of the procedure as well as the alternatives and risks of each were explained to the patient and/or caregiver.  Consent for the procedure was obtained and is signed in the bedside chart  Anesthesia Patient is on propofol and fentanyl infusions   Time Out Verified patient identification, verified procedure, site/side was marked, verified correct patient position, special equipment/implants available, medications/allergies/relevant history reviewed, required imaging and test results available.   Sterile Technique Usual hand hygiene, masks, gowns, and gloves were used   Procedure Description Bronchoscope advanced through endotracheal tube and into airway.  Airways were examined down to subsegmental level with findings noted below.   Following diagnostic evaluation, BAL(s) performed in 60 cc with normal saline and return of 25 cc fluid from the superior segment of the right lower lobe.  Findings: Airways were patent and normal in appearance there were no endobronchial lesions.  There were no abnormal secretions seen.   Complications/Tolerance None; patient tolerated the procedure well. Chest X-ray is not needed post procedure.   EBL None   Specimen(s) Right lower lobe BAL from the superior segment to be sent for respiratory culture, AFB and fungal smears and culture.   Levy Pupa, MD, PhD 07/17/2023, 10:58 AM [facility] [phone] or if no answer before 7:00PM call [phone] For any issues after 7:00PM please call eLink [phone]

## 2023-07-17 NOTE — Consult Note (Addendum)
WOC Nurse Consult Note: Reason for Consult: Requested to assess bilateral leg wounds. Performed remotely after assess photos and notes. Wound type: Multiple full thickness. Alcohol abuse, probably a arterial vascular condition. Wound bed: dry, cover by crusts, dry blood. Drainage (amount, consistency, odor) low amount Periwound: dry. Dressing procedure/placement/frequency: Apply barrier cream on the legs. Apply on the wound bed Xeroform, cover with gauze, wrap with Kerlix beginning on the bottom of the toes, until the knees, not too tight.  WOC team will not plan to follow further.  Please reconsult if further assistance is needed. Thank-you,  Denyse Amass BSN, RN, [employer], WOC  (Pager: [phone])

## 2023-07-17 NOTE — Consult Note (Signed)
Urology Consult Note   Requesting Attending Physician:  Leslye Peer, MD Service Providing Consult: Urology  Consulting Attending: Dr. Berneice Heinrich   Reason for Consult: Retained ureteral stents in the context of septic shock  HPI: Marie Brewer is seen in consultation for reasons noted above at the request of Leslye Peer, MD. [facility] significant for cervical cancer-s/p chemo, radiation, and brachytherapy.  She has been followed by Dr. Liliane Shi and has had bilateral ureteral stents for 4 to 5 cm long distal strictures bilaterally.  She has been lost to follow-up multiple times, having Stents for around a year.  She presented to [facility] emergency department via EMS with abdominal pain, hematuria, and altered mental status.  She was ultimately intubated in the emergency department as she became unresponsive and and was transferred to the intensive care unit with acute on chronic kidney disease.  She remains intubated and has been started on CRRT.  ------------------  Assessment:  42 y.o. female with bilateral retained ureteral stents and acute renal failure, critical condition, intubated.   Recommendations: # UTI # Sepsis # Possible pyelonephritis Urinalysis nitrite positive and consistent with infection.  CT A/P was not able to be performed with contrast but there are areas of hypoattenuation of the renal parenchyma that are suggestive of upper tract infection. Agree with broad-spectrum ABX  #retained ureteral stents As stated above, patient has been lost to follow-up multiple times and is no longer a candidate for indwelling ureteral stents.  Recommend bilateral percutaneous nephrostomy tube placement in interventional radiology.  Her CT A/P shows hydronephrosis that is relatively unchanged compared to last year.  I am less likely to think that her uropathy is completely obstructive.  As long as she remains on CRRT, there is time for her to stabilize before requesting any invasive  procedures.  Stents do not appear calcified on CT scan and can likely be removed at bedside while she remains intubated and sedated.  Will continue to follow along with you.  Case and plan discussed with Dr. Berneice Heinrich, Dr. Corliss Blacker, and Dr. Sloan Leiter  Past Medical History: Past Medical History:  Diagnosis Date   Anemia    Bilateral ureteral obstruction    Cancer (HCC)    Chronic kidney disease    History of blood transfusion 08/30/2021   1 unit   History of cancer chemotherapy    completed 06/ 2011 for cervical cancer   History of cervical cancer 08/2009   followed by [facility] cancer center;  dx SCC Stage IIB,  completed chemoradiation 06/ 2011   History of radiation therapy    completed 06/ 2011 ffor cervical cancer   Migraine headache    Polysubstance abuse (HCC) 05/05/2020   heroin    Past Surgical History:  Past Surgical History:  Procedure Laterality Date   CHOLECYSTECTOMY     CYSTOSCOPY W/ URETERAL STENT PLACEMENT Bilateral 08/15/2020   Procedure: CYSTOSCOPY WITH RETROGRADE PYELOGRAM/URETERAL STENT PLACEMENT;  Surgeon: Rene Paci, MD;  Location: [facility] OR;  Service: Urology;  Laterality: Bilateral;   CYSTOSCOPY W/ URETERAL STENT PLACEMENT Bilateral 09/07/2021   Procedure: CYSTOSCOPY WITH RETROGRADE PYELOGRAM/URETERAL STENT EXCHANGE;  Surgeon: Rene Paci, MD;  Location: [facility] ORS;  Service: Urology;  Laterality: Bilateral;   CYSTOSCOPY W/ URETERAL STENT PLACEMENT Bilateral 04/19/2022   Procedure: CYSTOSCOPY WITH STENT EXCHANGE;  Surgeon: Rene Paci, MD;  Location: [facility] OR[facility];  Service: Urology;  Laterality: Bilateral;   CYSTOSCOPY WITH STENT PLACEMENT Bilateral 11/15/2022   Procedure: CYSTOSCOPY WITH BILATERAL STENT EXCHANGE;  Surgeon: Rene Paci, MD;  Location[facility];  Service: Urology;  Laterality: Bilateral;   INCISION AND DRAINAGE ABSCESS  04/09/2003   @[facility]  ;  RIGHT SUBMANDIBULAR ABSCESS (LARGE) AND EXTRACTION  MULTIPLE TEETH   IRRIGATION AND DEBRIDEMENT ABSCESS Bilateral 05/05/2020   Procedure: left forearm incision and drainage and aspiration of the right wrist joint;  Surgeon: Allena Napoleon, MD;  Location: [facility] OR[facility];  Service: Plastics;  Laterality: Bilateral;    Medication: Current Facility-Administered Medications  Medication Dose Route Frequency Provider Last Rate Last Admin    prismasol BGK 4/2.5 infusion   CRRT Continuous Arita Miss, MD 800 mL/hr at 07/17/23 0942 New Bag at 07/17/23 0942    prismasol BGK 4/2.5 infusion   CRRT Continuous Arita Miss, MD 400 mL/hr at 07/17/23 0942 New Bag at 07/17/23 0942   0.9 %  sodium chloride infusion   Intravenous PRN Lorin Glass, MD 10 mL/hr at 07/17/23 1200 Infusion Verify at 07/17/23 1200   ceFEPIme (MAXIPIME) 2 g in sodium chloride 0.9 % 100 mL IVPB  2 g Intravenous Q12H Stevphen [place], [facility]   Stopped at 07/17/23 8119   Chlorhexidine Gluconate Cloth 2 % PADS 6 each  6 each Topical Daily Lorin Glass, MD   6 each at 07/17/23 0944   Chlorhexidine Gluconate Cloth 2 % PADS 6 each  6 each Topical Q0600 Lorin Glass, MD       docusate (COLACE) 50 MG/5ML liquid 100 mg  100 mg Per Tube BID Cloyd Stagers M, PA-C   100 mg at 07/17/23 1124   etomidate (AMIDATE) injection 21.1 mg  0.3 mg/kg Intravenous Once Glyn Ade, MD       fentaNYL (SUBLIMAZE) bolus via infusion 50-100 mcg  50-100 mcg Intravenous Q15 min PRN Glyn Ade, MD   50 mcg at 07/17/23 1054   fentaNYL in NS (84mcg/ml) infusion-PREMIX  50-200 mcg/hr Intravenous Continuous Cloyd Stagers M, PA-C 20 mL/hr at 07/17/23 1200 200 mcg/hr at 07/17/23 1200   heparin 10,000 units/ 20 mL infusion syringe  350-750 Units/hr CRRT Continuous Terrial Rhodes, MD 1.1 mL/hr at 07/17/23 0930 550 Units/hr at 07/17/23 0930   heparin injection 1,000-6,000 Units  1,000-6,000 Units CRRT PRN Terrial Rhodes, MD       heparin injection 5,000 Units  5,000 Units Subcutaneous  Q8H Cloyd Stagers M, PA-C   5,000 Units at 07/17/23 1478   heparinized saline (2000 units/L) primer fluid for CRRT   CRRT PRN Terrial Rhodes, MD   2,000 mL at 07/17/23 2956   insulin aspart (novoLOG) injection 0-9 Units  0-9 Units Subcutaneous Q4H Cloyd Stagers M, PA-C       midazolam (VERSED) injection 1-2 mg  1-2 mg Intravenous Q1H PRN Cloyd Stagers M, PA-C       mupirocin ointment (BACTROBAN) 2 % 1 Application  1 Application Nasal BID Lorin Glass, MD   1 Application at 07/17/23 [phone]   norepinephrine (LEVOPHED) 4mg  in (0.016 mg/mL) premix infusion  0-40 mcg/min Intravenous Continuous Glyn Ade, MD 11.25 mL/hr at 07/17/23 1200 3 mcg/min at 07/17/23 1200   Oral care mouth rinse  15 mL Mouth Rinse Q2H Lorin Glass, MD   15 mL at 07/17/23 1112   Oral care mouth rinse  15 mL Mouth Rinse PRN Lorin Glass, MD       pantoprazole (PROTONIX) injection 40 mg  40 mg Intravenous QHS Cloyd Stagers M, PA-C   40 mg at  07/16/23 2222   polyethylene glycol (MIRALAX / GLYCOLAX) packet 17 g  17 g Per Tube Daily Cloyd Stagers M, PA-C   17 g at 07/17/23 1124   prismasol BGK 4/2.5 infusion   CRRT Continuous Sabra Heck B, MD 2,000 mL/hr at 07/17/23 1205 New Bag at 07/17/23 1205   propofol (DIPRIVAN) 1000 MG/100ML infusion  0-50 mcg/kg/min Intravenous Continuous Cloyd Stagers M, PA-C 14.76 mL/hr at 07/17/23 1200 35 mcg/kg/min at 07/17/23 1200   rocuronium (ZEMURON) injection 70.3 mg  1 mg/kg Intravenous Once Glyn Ade, MD       vancomycin (VANCOCIN) IVPB 1000 mg/200 mL premix  1,000 mg Intravenous Q24H Stevphen Rochester, RPH       Facility-Administered Medications Ordered in Other Encounters  Medication Dose Route Frequency Provider Last Rate Last Admin   acetaminophen (TYLENOL) tablet 650 mg  650 mg Oral Once Artis Delay, MD        Allergies: Allergies  Allergen Reactions   Imitrex [Sumatriptan Base] Palpitations    Heart races   Cephalexin Rash    Patient  passed amoxicillin challenge on 08/18/20, no adverse effect    Social History: Social History   Tobacco Use   Smoking status: Some Days    Current packs/day: 0.25    Average packs/day: 0.3 packs/day for 10.0 years (2.5 ttl pk-yrs)    Types: Cigarettes   Smokeless tobacco: Never  Vaping Use   Vaping status: Every Day   Substances: Nicotine  Substance Use Topics   Alcohol use: No   Drug use: Not Currently    Types: Heroin    Family History Family History  Problem Relation Age of Onset   Autism spectrum disorder Other     Review of Systems  Unable to perform ROS: Critical illness     Objective   Vital signs in last 24 hours: BP 91/61   Pulse (!) 119   Temp 98.2 F (36.8 C) (Axillary)   Resp (!) 27   Ht 5\' 6"  (1.676 m)   Wt 68.8 kg   SpO2 99%   BMI 24.48 kg/m   Physical Exam General: NAD, A&O, resting, appropriate HEENT: [place]/AT Pulmonary: Normal work of breathing Cardiovascular: RRR, no cyanosis Abdomen: Soft, NTTP, nondistended GU: Foley catheter in place draining scant amounts of bloody purulent urine. Neuro: Appropriate, no focal neurological deficits  Most Recent Labs: Lab Results  Component Value Date   WBC 31.3 (H) 07/17/2023   HGB 8.8 (L) 07/17/2023   HCT 26.0 (L) 07/17/2023   PLT 530 (H) 07/17/2023    Lab Results  Component Value Date   NA 139 07/17/2023   K 3.5 07/17/2023   CL 106 07/17/2023   CO2 14 (L) 07/17/2023   BUN 133 (H) 07/17/2023   CREATININE 9.56 (H) 07/17/2023   CALCIUM 7.5 (L) 07/17/2023   MG 2.0 07/17/2023   PHOS 8.1 (H) 07/17/2023    Lab Results  Component Value Date   INR 1.2 08/15/2020   APTT 27 07/17/2023     Urine Culture: @[id number] (laburin,org,[id number])@   IMAGING: DG Chest Port 1 View Result Date: 07/17/2023 CLINICAL DATA:  Evaluate ET tube placement EXAM: PORTABLE CHEST 1 VIEW COMPARISON:  07/16/2023 FINDINGS: The endotracheal tube tip is above the carina. There is a right IJ catheter with tip in  the superior cavoatrial junction. Enteric tube tip and side port are below the GE junction. Stable cardiomediastinal contours. No pleural fluid, interstitial edema or airspace disease. IMPRESSION: 1. Satisfactory position of endotracheal tube. 2. No  acute cardiopulmonary abnormalities. Electronically Signed   By: Signa Kell M.D.   On: 07/17/2023 07:40   DG CHEST PORT 1 VIEW Result Date: 07/17/2023 CLINICAL DATA:  Central line EXAM: PORTABLE CHEST 1 VIEW COMPARISON:  Chest x-ray 07/16/2023 FINDINGS: Endotracheal tube tip is 2.4 cm above the carina. New right-sided central venous catheter tip ends in the SVC. Enteric tube extends below the diaphragm. The heart size and mediastinal contours are within normal limits. Both lungs are clear. There is no pleural effusion or pneumothorax. The visualized skeletal structures are unremarkable. IMPRESSION: New right-sided central venous catheter tip ends in the SVC. No pneumothorax. Electronically Signed   By: Darliss Cheney M.D.   On: 07/17/2023 01:11   CT CHEST ABDOMEN PELVIS WO CONTRAST Result Date: 07/16/2023 CLINICAL DATA:  Sepsis, delirium EXAM: CT CHEST, ABDOMEN AND PELVIS WITHOUT CONTRAST TECHNIQUE: Multidetector CT imaging of the chest, abdomen and pelvis was performed following the standard protocol without IV contrast. RADIATION DOSE REDUCTION: This exam was performed according to the departmental dose-optimization program which includes automated exposure control, adjustment of the mA and/or kV according to patient size and/or use of iterative reconstruction technique. COMPARISON:  Chest radiograph 07/16/2023; CT abdomen and pelvis 08/05/2021 and CT chest 08/18/2020 FINDINGS: CT CHEST FINDINGS Cardiovascular: Normal heart size.  No pericardial effusion. Mediastinum/Nodes: Endotracheal tube tip in the intrathoracic trachea. Enteric tube tip in the stomach. Small amount of pneumomediastinum. No definite thoracic adenopathy Lungs/Pleura: Bilateral  centrilobular micro nodules and tree-in-bud opacities greatest in the bilateral lower lobes compatible with small airway infection/inflammation and/or aspiration. No pleural effusion or pneumothorax. Musculoskeletal: No acute fracture. CT ABDOMEN PELVIS FINDINGS Hepatobiliary: Unremarkable noncontrast appearance of the liver. Cholecystectomy. No biliary dilation. Pancreas: Unremarkable. Spleen: Unremarkable. Adrenals/Urinary Tract: Stable adrenal glands. Bilateral ureteral stents. Bilateral hydronephrosis is similar to 08/05/2021. Thick-walled nondistended urinary bladder about a Foley catheter. Locules of gas in the bladder. Stomach/Bowel: No bowel obstruction or bowel wall thickening. The appendix is normal. Stomach is within normal limits. Vascular/Lymphatic: Similar prominent retroperitoneal lymph nodes. For example, 1.0 cm left para-aortic lymph node. Bilateral inguinal adenopathy measuring 1.7 cm on the left and 1.8 cm on the right is similar to decreased compared to 08/05/2021. No new or enlarging thoracic adenopathy. Unremarkable noncontrast appearance of the vasculature. Reproductive: Unremarkable. Other: Small volume free fluid in the pelvis. No free intraperitoneal air. Musculoskeletal: No acute fracture. IMPRESSION: 1. Bilateral centrilobular micro nodules and tree-in-bud opacities greatest in the bilateral lower lobes compatible with small airway infection/inflammation and/or aspiration. 2. Small amount of pneumomediastinum. 3. Thick-walled nondistended urinary bladder about a Foley catheter. Correlate with urinalysis to exclude cystitis. 4. Bilateral ureteral stents. Bilateral hydronephrosis is similar to 08/05/2021. 5. Similar prominent retroperitoneal lymph nodes and decreased inguinal lymph nodes. Electronically Signed   By: Minerva Fester M.D.   On: 07/16/2023 22:13   CT Head Wo Contrast Result Date: 07/16/2023 CLINICAL DATA:  Delirium and sepsis EXAM: CT HEAD WITHOUT CONTRAST TECHNIQUE:  Contiguous axial images were obtained from the base of the skull through the vertex without intravenous contrast. RADIATION DOSE REDUCTION: This exam was performed according to the departmental dose-optimization program which includes automated exposure control, adjustment of the mA and/or kV according to patient size and/or use of iterative reconstruction technique. COMPARISON:  None Available. FINDINGS: Brain: There is no mass, hemorrhage or extra-axial collection. Normal appearance of the white matter with preserved gray-white differentiation. Normal CSF spaces. Vascular: Negative Skull: Negative Sinuses/Orbits: Moderate paranasal sinus disease. Other: Endotracheal intubation IMPRESSION: 1. No  acute intracranial abnormality. 2. Diffuse, moderately severe paranasal sinus disease. Electronically Signed   By: Deatra Robinson M.D.   On: 07/16/2023 21:13   DG Chest Portable 1 View Result Date: 07/16/2023 CLINICAL DATA:  Shortness of breath: ETT placement EXAM: PORTABLE CHEST 1 VIEW COMPARISON:  Same day chest radiograph FINDINGS: Stable cardiomediastinal silhouette. Aortic atherosclerotic calcification. No focal consolidation, pleural effusion, or pneumothorax. No displaced rib fractures. Subdiaphragmatic enteric tube with side port in the stomach. Endotracheal tube tip in the mid intrathoracic trachea proximally cm from the carina. IMPRESSION: Endotracheal tube tip in the mid intrathoracic trachea. Subdiaphragmatic enteric tube. Otherwise no change from radiographs earlier today. Electronically Signed   By: Minerva Fester M.D.   On: 07/16/2023 20:57   DG Chest 1 View Result Date: 07/16/2023 CLINICAL DATA:  Reason for exam: Patient order states screening. Best obtainable images due to patient cooperation and condition. EXAM: CHEST  1 VIEW COMPARISON:  11/24/2007 radiographic and 08/18/2020 CT chest FINDINGS: Normal cardiomediastinal silhouette. No focal consolidation, pleural effusion, or pneumothorax. No  displaced rib fractures. IMPRESSION: No acute cardiopulmonary disease. Electronically Signed   By: Minerva Fester M.D.   On: 07/16/2023 20:54    ------  Elmon Kirschner, NP Pager: [phone]   Please contact the urology consult pager with any further questions/concerns.

## 2023-07-17 NOTE — Progress Notes (Signed)
IR note  IR team is aware of this patient. After review, Dr. Andrey Campanile approved bilateral nephrostomy tube placement with possible ureteral stent removal.

## 2023-07-18 DIAGNOSIS — R6521 Severe sepsis with septic shock: Secondary | ICD-10-CM | POA: Diagnosis not present

## 2023-07-18 DIAGNOSIS — N390 Urinary tract infection, site not specified: Secondary | ICD-10-CM | POA: Diagnosis not present

## 2023-07-18 DIAGNOSIS — J9601 Acute respiratory failure with hypoxia: Secondary | ICD-10-CM | POA: Diagnosis not present

## 2023-07-18 DIAGNOSIS — R4182 Altered mental status, unspecified: Secondary | ICD-10-CM | POA: Diagnosis not present

## 2023-07-18 DIAGNOSIS — A419 Sepsis, unspecified organism: Secondary | ICD-10-CM | POA: Diagnosis not present

## 2023-07-18 DIAGNOSIS — J9691 Respiratory failure, unspecified with hypoxia: Secondary | ICD-10-CM | POA: Diagnosis not present

## 2023-07-18 DIAGNOSIS — F17210 Nicotine dependence, cigarettes, uncomplicated: Secondary | ICD-10-CM

## 2023-07-18 DIAGNOSIS — B950 Streptococcus, group A, as the cause of diseases classified elsewhere: Secondary | ICD-10-CM

## 2023-07-18 LAB — RENAL FUNCTION PANEL
Albumin: 1.8 g/dL — ABNORMAL LOW (ref 3.5–5.0)
Albumin: 1.6 g/dL — ABNORMAL LOW (ref 3.5–5.0)
Anion gap: 12 (ref 5–15)
Anion gap: 9 (ref 5–15)
BUN: 33 mg/dL — ABNORMAL HIGH (ref 6–20)
BUN: 20 mg/dL (ref 6–20)
CO2: 22 mmol/L (ref 22–32)
CO2: 24 mmol/L (ref 22–32)
Calcium: 8 mg/dL — ABNORMAL LOW (ref 8.9–10.3)
Calcium: 7.8 mg/dL — ABNORMAL LOW (ref 8.9–10.3)
Chloride: 101 mmol/L (ref 98–111)
Chloride: 101 mmol/L (ref 98–111)
Creatinine, Ser: 2.47 mg/dL — ABNORMAL HIGH (ref 0.44–1.00)
Creatinine, Ser: 1.97 mg/dL — ABNORMAL HIGH (ref 0.44–1.00)
GFR, Estimated: 24 mL/min — ABNORMAL LOW (ref >60)
GFR, Estimated: 32 mL/min — ABNORMAL LOW (ref >60)
Glucose, Bld: 110 mg/dL — ABNORMAL HIGH (ref 70–99)
Glucose, Bld: 134 mg/dL — ABNORMAL HIGH (ref 70–99)
Phosphorus: 2.4 mg/dL — ABNORMAL LOW (ref 2.5–4.6)
Phosphorus: 1.9 mg/dL — ABNORMAL LOW (ref 2.5–4.6)
Potassium: 4.4 mmol/L (ref 3.5–5.1)
Potassium: 3.8 mmol/L (ref 3.5–5.1)
Sodium: 135 mmol/L (ref 135–145)
Sodium: 134 mmol/L — ABNORMAL LOW (ref 135–145)

## 2023-07-18 LAB — CBC
HCT: 23.6 % — ABNORMAL LOW (ref 36.0–46.0)
Hemoglobin: 7.5 g/dL — ABNORMAL LOW (ref 12.0–15.0)
MCH: 29.4 pg (ref 26.0–34.0)
MCHC: 31.8 g/dL (ref 30.0–36.0)
MCV: 92.5 fL (ref 80.0–100.0)
Platelets: 348 10*3/uL (ref 150–400)
RBC: 2.55 MIL/uL — ABNORMAL LOW (ref 3.87–5.11)
RDW: 14.7 % (ref 11.5–15.5)
WBC: 36.1 10*3/uL — ABNORMAL HIGH (ref 4.0–10.5)
nRBC: 0.1 % (ref 0.0–0.2)

## 2023-07-18 LAB — GLUCOSE, CAPILLARY
Glucose-Capillary: 128 mg/dL — ABNORMAL HIGH (ref 70–99)
Glucose-Capillary: 122 mg/dL — ABNORMAL HIGH (ref 70–99)
Glucose-Capillary: 109 mg/dL — ABNORMAL HIGH (ref 70–99)
Glucose-Capillary: 117 mg/dL — ABNORMAL HIGH (ref 70–99)
Glucose-Capillary: 130 mg/dL — ABNORMAL HIGH (ref 70–99)
Glucose-Capillary: 136 mg/dL — ABNORMAL HIGH (ref 70–99)

## 2023-07-18 LAB — APTT: aPTT: 32 s (ref 24–36)

## 2023-07-18 LAB — MAGNESIUM: Magnesium: 2.1 mg/dL (ref 1.7–2.4)

## 2023-07-18 LAB — URINE CULTURE

## 2023-07-18 MED ORDER — ALTEPLASE 2 MG IJ SOLR
2.0000 mg | Freq: Once | INTRAMUSCULAR | Status: AC
  Administered 2023-07-18: 2 mg
  Filled 2023-07-18: qty 2

## 2023-07-18 MED ORDER — VITAL 1.5 CAL PO LIQD
1000.0000 mL | ORAL | Status: DC
  Administered 2023-07-19 – 2023-07-24 (×7): 1000 mL

## 2023-07-18 NOTE — Progress Notes (Signed)
Chief Complaint: Patient was seen today for bilateral percutaneous nephrostomy tube placement with bilateral ureteral stent removal.   Referring Physician(s): Elmon Kirschner, NP.  Supervising Physician: Richarda Overlie  Patient Status: [facility] - In-pt  Subjective: Patient is 1 day post procedure. Patient is currently sedated and intubated. Husband is not present during visit.   Objective:  BP 96/64   Pulse (!) 105   Temp 97.9 F (36.6 C) (Axillary)   Resp (!) 25   Ht 5\' 6"  (1.676 m)   Wt 149 lb 4 oz (67.7 kg)   SpO2 96%   BMI 24.09 kg/m   Physical Exam: Constitutional: Adult female, laying in bed, intubated and sedated.  HENT: Endotracheal tube in place. Chest: Adequate rise and fall of chest. Patient is intubated.  Extremities: warm and dry.  Skin: Left and right nephrostomy tubes in place posteriorly. Both tubes are attached to gravity bags and blood tinged output noted. Clean and dry dressings overly the nephrostomy tube insertion sites. Neuro: Unable to fully assess. Patient is intubated.    Current Facility-Administered Medications:     prismasol BGK 4/2.5 infusion, , CRRT, Continuous, Sanford, Ryan B, MD, Last Rate: 800 mL/hr at 07/18/23 0010, New Bag at 07/18/23 0010    prismasol BGK 4/2.5 infusion, , CRRT, Continuous, Sanford, Ryan B, MD, Last Rate: 400 mL/hr at 07/18/23 0010, New Bag at 07/18/23 0010   ceFEPIme (MAXIPIME) 2 g in sodium chloride 0.9 % 100 mL IVPB, 2 g, Intravenous, Q12H, Stevphen Rochester, RPH, Stopped at 07/18/23 0530   Chlorhexidine Gluconate Cloth 2 % PADS 6 each, 6 each, Topical, Daily, Lorin Glass, MD, 6 each at 07/18/23 [phone]   Chlorhexidine Gluconate Cloth 2 % PADS 6 each, 6 each, Topical, Q0600, Lorin Glass, MD, 6 each at 07/18/23 0100   docusate (COLACE) 50 MG/5ML liquid 100 mg, 100 mg, Per Tube, BID, Cloyd Stagers M, PA-C, 100 mg at 07/18/23 8295   etomidate (AMIDATE) injection 21.1 mg, 0.3 mg/kg, Intravenous, Once, Glyn Ade, MD   feeding supplement (PROSource TF20) liquid 60 mL, 60 mL, Per Tube, BID, Leslye Peer, MD, 60 mL at 07/18/23 0908   feeding supplement (VITAL 1.5 CAL) liquid 1,000 mL, 1,000 mL, Per Tube, Continuous, Leslye Peer, MD, Last Rate: 30 mL/hr at 07/18/23 1200, Infusion Verify at 07/18/23 1200   fentaNYL (SUBLIMAZE) bolus via infusion 50-100 mcg, 50-100 mcg, Intravenous, Q15 min PRN, Glyn Ade, MD, 50 mcg at 07/18/23 0325   fentaNYL in NS (53mcg/ml) infusion-PREMIX, 50-200 mcg/hr, Intravenous, Continuous, Cloyd Stagers M, PA-C, Last Rate: 15 mL/hr at 07/18/23 1200, 150 mcg/hr at 07/18/23 1200   heparin 10,000 units/ 20 mL infusion syringe, 350-750 Units/hr, CRRT, Continuous, Terrial Rhodes, MD, Last Rate: 1.1 mL/hr at 07/17/23 2137, 550 Units/hr at 07/17/23 2137   heparin injection 1,000-6,000 Units, 1,000-6,000 Units, CRRT, PRN, Terrial Rhodes, MD, 2,400 Units at 07/17/23 1505   heparin injection 5,000 Units, 5,000 Units, Subcutaneous, Q8H, Reese, Stephanie M, PA-C, 5,000 Units at 07/18/23 0501   heparinized saline (2000 units/L) primer fluid for CRRT, , CRRT, PRN, Terrial Rhodes, MD, Given at 07/17/23 1710   insulin aspart (novoLOG) injection 0-9 Units, 0-9 Units, Subcutaneous, Q4H, Reese, Stephanie M, PA-C, 1 Units at 07/18/23 0715   linezolid (ZYVOX) IVPB 600 mg, 600 mg, Intravenous, Q12H, Vu, Trung T, MD, Stopped at 07/18/23 1010   metroNIDAZOLE (FLAGYL) IVPB 500 mg, 500 mg, Intravenous, Q12H, Vu, Tonita Phoenix, MD, Stopped at 07/18/23 [phone]  midazolam (VERSED) injection 1-2 mg, 1-2 mg, Intravenous, Q1H PRN, Cloyd Stagers M, PA-C   multivitamin (RENA-VIT) tablet 1 tablet, 1 tablet, Per Tube, QHS, Leslye Peer, MD, 1 tablet at 07/17/23 2116   mupirocin ointment (BACTROBAN) 2 % 1 Application, 1 Application, Nasal, BID, Lorin Glass, MD, 1 Application at 07/18/23 1610   norepinephrine (LEVOPHED) 4mg  in (0.016 mg/mL) premix infusion, 0-40  mcg/min, Intravenous, Continuous, Countryman, Chase, MD, Last Rate: 3.75 mL/hr at 07/18/23 1200, 1 mcg/min at 07/18/23 1200   Oral care mouth rinse, 15 mL, Mouth Rinse, Q2H, Lorin Glass, MD, 15 mL at 07/18/23 1112   Oral care mouth rinse, 15 mL, Mouth Rinse, PRN, Lorin Glass, MD   pantoprazole (PROTONIX) injection 40 mg, 40 mg, Intravenous, QHS, Reese, Stephanie M, PA-C, 40 mg at 07/17/23 2116   polyethylene glycol (MIRALAX / GLYCOLAX) packet 17 g, 17 g, Per Tube, Daily, Cloyd Stagers M, PA-C, 17 g at 07/18/23 0908   prismasol BGK 4/2.5 infusion, , CRRT, Continuous, Sabra Heck B, MD, Last Rate: 2,000 mL/hr at 07/18/23 1026, New Bag at 07/18/23 1026   propofol (DIPRIVAN) 1000 MG/100ML infusion, 0-50 mcg/kg/min, Intravenous, Continuous, Cloyd Stagers M, PA-C, Last Rate: 14.76 mL/hr at 07/18/23 1200, 35 mcg/kg/min at 07/18/23 1200   rocuronium (ZEMURON) injection 70.3 mg, 1 mg/kg, Intravenous, Once, Countryman, Chase, MD   sodium chloride flush (NS) 0.9 % injection 5 mL, 5 mL, Intracatheter, Q8H, Suttle, Dylan J, MD, 5 mL at 07/18/23 9604  Facility-Administered Medications Ordered in Other Encounters:    acetaminophen (TYLENOL) tablet 650 mg, 650 mg, Oral, Once, Artis Delay, MD  Labs: CBC Recent Labs    07/17/23 0455 07/17/23 1016 07/18/23 0346  WBC 31.3*  --  36.1*  HGB 8.2* 8.8* 7.5*  HCT 25.0* 26.0* 23.6*  PLT 530*  --  348   BMET Recent Labs    07/17/23 1418 07/18/23 0346  NA 134* 135  K 3.9 4.4  CL 102 101  CO2 21* 22  GLUCOSE 98 110*  BUN 67* 33*  CREATININE 4.93* 2.47*  CALCIUM 7.6* 8.0*   LFT Recent Labs    07/16/23 1652 07/17/23 0322 07/18/23 0346  PROT 11.3*  --   --   ALBUMIN 2.7*   < > 1.8*  AST 24  --   --   ALT 15  --   --   ALKPHOS 127*  --   --   BILITOT 0.8  --   --   LIPASE 133*  --   --    < > = values in this interval not displayed.   PT/INR Recent Labs    07/17/23 1418  LABPROT 15.7*  INR 1.2     Studies/Results: IR  NEPHROSTOMY PLACEMENT LEFT Result Date: 07/18/2023 INDICATION: 42 year old female with history of cervical cancer and bilateral nephroureteral stents placed in 2023 presenting with worsening renal function and sepsis, suspected urinary in origin. Request for bilateral nephrostomy tubes and removal of indwelling ureteral stents. EXAM: 1. ULTRASOUND GUIDANCE FOR PUNCTURE OF THE LEFT RENAL COLLECTING SYSTEM 2. LEFT PERCUTANEOUS NEPHROSTOMY TUBE PLACEMENT. 3. Fluoroscopic guided left ureteral stent retrieval. 4. ULTRASOUND GUIDANCE FOR PUNCTURE OF THE RIGHT RENAL COLLECTING SYSTEM 5. RIGHT PERCUTANEOUS NEPHROSTOMY TUBE PLACEMENT. 6. Fluoroscopic guided right ureteral stent retrieval. COMPARISON:  07/16/23 MEDICATIONS: The patient was currently receiving multiple intravenous antibiotics as an inpatient; The antibiotics were administered in an appropriate time frame prior to skin puncture. ANESTHESIA/SEDATION: The patient was intubated and sedated  under the care of the Intensive Care Unit. CONTRAST:  25 mL Omnipaque 300 - administered into the renal collecting system FLUOROSCOPY TIME:  134 mGy COMPLICATIONS: None immediate. PROCEDURE: The procedure, risks, benefits, and alternatives were explained to the patient. Questions regarding the procedure were encouraged and answered. The patient understands and consents to the procedure. A timeout was performed prior to the initiation of the procedure. The left flank region was prepped and draped in the usual sterile fashion and a sterile drape was applied covering the operative field. A sterile gown and sterile gloves were used for the procedure. Local anesthesia was provided with 1% Lidocaine with epinephrine. Ultrasound was used to localize the left kidney. Under direct ultrasound guidance, a 20 gauge needle was advanced into the renal collecting system. An ultrasound image documentation was performed. Over a Nitrex wire, the tract was dilated with an Accustick stent. Next,  under intermittent fluoroscpic guidance and over a short Amplatz wire, the track was dilated and a 7 Fr, 45 cm Tourguide sheath was inserted. Through the sheath, a 6 Fr, 3.5 cm loop snare was inserted, the proximal aspect of the ureteral stent was grasped and removed intact. The Amplatz wire was reinserted and the sheath was exchanged for a 10-French percutaneous nephrostomy catheter which was advanced to the level of the renal pelvis where the coil was formed and locked. Contrast was injected and confirmed location within the renal pelvis. The catheter was secured at the skin with a 0-silk retention suture and stat lock device and connected to a gravity bag was placed. Dressings were applied. The right flank region was prepped and draped in the usual sterile fashion and a sterile drape was applied covering the operative field. A sterile gown and sterile gloves were used for the procedure. Local anesthesia was provided with 1% Lidocaine with epinephrine. Ultrasound was used to localize the right kidney. Under direct ultrasound guidance, a 20 gauge needle was advanced into the renal collecting system. An ultrasound image documentation was performed. Over a Nitrex wire, the tract was dilated with an Accustick stent. Next, under intermittent fluoroscpic guidance and over a short Amplatz wire, the track was dilated and a 7 Fr, 45 cm Tourguide sheath was inserted. Through the sheath, a 6 Fr, 3.5 cm loop snare was inserted, the proximal aspect of the ureteral stent was grasped and removed intact. The Amplatz wire was reinserted and the sheath was exchanged for a 10-French percutaneous nephrostomy catheter which was advanced to the level of the renal pelvis where the coil was formed and locked. Contrast was injected and confirmed location within the renal pelvis. The catheter was secured at the skin with a 0-silk retention suture and stat lock device and connected to a gravity bag was placed. Dressings were applied. The  patient tolerated procedure well without immediate postprocedural complication. FINDINGS: Ultrasound scanning demonstrates a minimally dilated left collecting system and moderated dilated right collecting system with diffuse parenchymal edema. IMPRESSION: 1. Successful ultrasound and fluoroscopic guided left ureteral stent retrieval placement of a left 10 French percutaneous nephrostomy. 2. Successful ultrasound and fluoroscopic guided right ureteral stent retrieval placement of a right 10 French percutaneous nephrostomy. Marliss Coots, MD [facility] Radiology Electronically Signed   By: Marliss Coots M.D.   On: 07/18/2023 08:18   IR NEPHROSTOMY PLACEMENT RIGHT Result Date: 07/18/2023 INDICATION: 42 year old female with history of cervical cancer and bilateral nephroureteral stents placed in 2023 presenting with worsening renal function and sepsis, suspected  urinary in origin. Request for bilateral nephrostomy tubes and removal of indwelling ureteral stents. EXAM: 1. ULTRASOUND GUIDANCE FOR PUNCTURE OF THE LEFT RENAL COLLECTING SYSTEM 2. LEFT PERCUTANEOUS NEPHROSTOMY TUBE PLACEMENT. 3. Fluoroscopic guided left ureteral stent retrieval. 4. ULTRASOUND GUIDANCE FOR PUNCTURE OF THE RIGHT RENAL COLLECTING SYSTEM 5. RIGHT PERCUTANEOUS NEPHROSTOMY TUBE PLACEMENT. 6. Fluoroscopic guided right ureteral stent retrieval. COMPARISON:  07/16/23 MEDICATIONS: The patient was currently receiving multiple intravenous antibiotics as an inpatient; The antibiotics were administered in an appropriate time frame prior to skin puncture. ANESTHESIA/SEDATION: The patient was intubated and sedated under the care of the Intensive Care Unit. CONTRAST:  25 mL Omnipaque 300 - administered into the renal collecting system FLUOROSCOPY TIME:  134 mGy COMPLICATIONS: None immediate. PROCEDURE: The procedure, risks, benefits, and alternatives were explained to the patient. Questions regarding the  procedure were encouraged and answered. The patient understands and consents to the procedure. A timeout was performed prior to the initiation of the procedure. The left flank region was prepped and draped in the usual sterile fashion and a sterile drape was applied covering the operative field. A sterile gown and sterile gloves were used for the procedure. Local anesthesia was provided with 1% Lidocaine with epinephrine. Ultrasound was used to localize the left kidney. Under direct ultrasound guidance, a 20 gauge needle was advanced into the renal collecting system. An ultrasound image documentation was performed. Over a Nitrex wire, the tract was dilated with an Accustick stent. Next, under intermittent fluoroscpic guidance and over a short Amplatz wire, the track was dilated and a 7 Fr, 45 cm Tourguide sheath was inserted. Through the sheath, a 6 Fr, 3.5 cm loop snare was inserted, the proximal aspect of the ureteral stent was grasped and removed intact. The Amplatz wire was reinserted and the sheath was exchanged for a 10-French percutaneous nephrostomy catheter which was advanced to the level of the renal pelvis where the coil was formed and locked. Contrast was injected and confirmed location within the renal pelvis. The catheter was secured at the skin with a 0-silk retention suture and stat lock device and connected to a gravity bag was placed. Dressings were applied. The right flank region was prepped and draped in the usual sterile fashion and a sterile drape was applied covering the operative field. A sterile gown and sterile gloves were used for the procedure. Local anesthesia was provided with 1% Lidocaine with epinephrine. Ultrasound was used to localize the right kidney. Under direct ultrasound guidance, a 20 gauge needle was advanced into the renal collecting system. An ultrasound image documentation was performed. Over a Nitrex wire, the tract was dilated with an Accustick stent. Next, under  intermittent fluoroscpic guidance and over a short Amplatz wire, the track was dilated and a 7 Fr, 45 cm Tourguide sheath was inserted. Through the sheath, a 6 Fr, 3.5 cm loop snare was inserted, the proximal aspect of the ureteral stent was grasped and removed intact. The Amplatz wire was reinserted and the sheath was exchanged for a 10-French percutaneous nephrostomy catheter which was advanced to the level of the renal pelvis where the coil was formed and locked. Contrast was injected and confirmed location within the renal pelvis. The catheter was secured at the skin with a 0-silk retention suture and stat lock device and connected to a gravity bag was placed. Dressings were applied. The patient tolerated procedure well without immediate postprocedural complication. FINDINGS: Ultrasound scanning demonstrates a minimally dilated left collecting system and moderated dilated right collecting system with  diffuse parenchymal edema. IMPRESSION: 1. Successful ultrasound and fluoroscopic guided left ureteral stent retrieval placement of a left 10 French percutaneous nephrostomy. 2. Successful ultrasound and fluoroscopic guided right ureteral stent retrieval placement of a right 10 French percutaneous nephrostomy. Marliss Coots, MD [facility] Radiology Electronically Signed   By: Marliss Coots M.D.   On: 07/18/2023 08:18   IR URETERAL STENT PERC REMOVAL MOD SED Result Date: 07/18/2023 INDICATION: 42 year old female with history of cervical cancer and bilateral nephroureteral stents placed in 2023 presenting with worsening renal function and sepsis, suspected urinary in origin. Request for bilateral nephrostomy tubes and removal of indwelling ureteral stents. EXAM: 1. ULTRASOUND GUIDANCE FOR PUNCTURE OF THE LEFT RENAL COLLECTING SYSTEM 2. LEFT PERCUTANEOUS NEPHROSTOMY TUBE PLACEMENT. 3. Fluoroscopic guided left ureteral stent retrieval. 4. ULTRASOUND GUIDANCE FOR  PUNCTURE OF THE RIGHT RENAL COLLECTING SYSTEM 5. RIGHT PERCUTANEOUS NEPHROSTOMY TUBE PLACEMENT. 6. Fluoroscopic guided right ureteral stent retrieval. COMPARISON:  07/16/23 MEDICATIONS: The patient was currently receiving multiple intravenous antibiotics as an inpatient; The antibiotics were administered in an appropriate time frame prior to skin puncture. ANESTHESIA/SEDATION: The patient was intubated and sedated under the care of the Intensive Care Unit. CONTRAST:  25 mL Omnipaque 300 - administered into the renal collecting system FLUOROSCOPY TIME:  134 mGy COMPLICATIONS: None immediate. PROCEDURE: The procedure, risks, benefits, and alternatives were explained to the patient. Questions regarding the procedure were encouraged and answered. The patient understands and consents to the procedure. A timeout was performed prior to the initiation of the procedure. The left flank region was prepped and draped in the usual sterile fashion and a sterile drape was applied covering the operative field. A sterile gown and sterile gloves were used for the procedure. Local anesthesia was provided with 1% Lidocaine with epinephrine. Ultrasound was used to localize the left kidney. Under direct ultrasound guidance, a 20 gauge needle was advanced into the renal collecting system. An ultrasound image documentation was performed. Over a Nitrex wire, the tract was dilated with an Accustick stent. Next, under intermittent fluoroscpic guidance and over a short Amplatz wire, the track was dilated and a 7 Fr, 45 cm Tourguide sheath was inserted. Through the sheath, a 6 Fr, 3.5 cm loop snare was inserted, the proximal aspect of the ureteral stent was grasped and removed intact. The Amplatz wire was reinserted and the sheath was exchanged for a 10-French percutaneous nephrostomy catheter which was advanced to the level of the renal pelvis where the coil was formed and locked. Contrast was injected and confirmed location within the renal  pelvis. The catheter was secured at the skin with a 0-silk retention suture and stat lock device and connected to a gravity bag was placed. Dressings were applied. The right flank region was prepped and draped in the usual sterile fashion and a sterile drape was applied covering the operative field. A sterile gown and sterile gloves were used for the procedure. Local anesthesia was provided with 1% Lidocaine with epinephrine. Ultrasound was used to localize the right kidney. Under direct ultrasound guidance, a 20 gauge needle was advanced into the renal collecting system. An ultrasound image documentation was performed. Over a Nitrex wire, the tract was dilated with an Accustick stent. Next, under intermittent fluoroscpic guidance and over a short Amplatz wire, the track was dilated and a 7 Fr, 45 cm Tourguide sheath was inserted. Through the sheath, a 6 Fr, 3.5 cm loop snare was inserted, the proximal aspect of the ureteral stent  was grasped and removed intact. The Amplatz wire was reinserted and the sheath was exchanged for a 10-French percutaneous nephrostomy catheter which was advanced to the level of the renal pelvis where the coil was formed and locked. Contrast was injected and confirmed location within the renal pelvis. The catheter was secured at the skin with a 0-silk retention suture and stat lock device and connected to a gravity bag was placed. Dressings were applied. The patient tolerated procedure well without immediate postprocedural complication. FINDINGS: Ultrasound scanning demonstrates a minimally dilated left collecting system and moderated dilated right collecting system with diffuse parenchymal edema. IMPRESSION: 1. Successful ultrasound and fluoroscopic guided left ureteral stent retrieval placement of a left 10 French percutaneous nephrostomy. 2. Successful ultrasound and fluoroscopic guided right ureteral stent retrieval placement of a right 10 French percutaneous nephrostomy. Marliss Coots,  MD [facility] Radiology Electronically Signed   By: Marliss Coots M.D.   On: 07/18/2023 08:18   IR URETERAL STENT PERC REMOVAL MOD SED Result Date: 07/18/2023 INDICATION: 42 year old female with history of cervical cancer and bilateral nephroureteral stents placed in 2023 presenting with worsening renal function and sepsis, suspected urinary in origin. Request for bilateral nephrostomy tubes and removal of indwelling ureteral stents. EXAM: 1. ULTRASOUND GUIDANCE FOR PUNCTURE OF THE LEFT RENAL COLLECTING SYSTEM 2. LEFT PERCUTANEOUS NEPHROSTOMY TUBE PLACEMENT. 3. Fluoroscopic guided left ureteral stent retrieval. 4. ULTRASOUND GUIDANCE FOR PUNCTURE OF THE RIGHT RENAL COLLECTING SYSTEM 5. RIGHT PERCUTANEOUS NEPHROSTOMY TUBE PLACEMENT. 6. Fluoroscopic guided right ureteral stent retrieval. COMPARISON:  07/16/23 MEDICATIONS: The patient was currently receiving multiple intravenous antibiotics as an inpatient; The antibiotics were administered in an appropriate time frame prior to skin puncture. ANESTHESIA/SEDATION: The patient was intubated and sedated under the care of the Intensive Care Unit. CONTRAST:  25 mL Omnipaque 300 - administered into the renal collecting system FLUOROSCOPY TIME:  134 mGy COMPLICATIONS: None immediate. PROCEDURE: The procedure, risks, benefits, and alternatives were explained to the patient. Questions regarding the procedure were encouraged and answered. The patient understands and consents to the procedure. A timeout was performed prior to the initiation of the procedure. The left flank region was prepped and draped in the usual sterile fashion and a sterile drape was applied covering the operative field. A sterile gown and sterile gloves were used for the procedure. Local anesthesia was provided with 1% Lidocaine with epinephrine. Ultrasound was used to localize the left kidney. Under direct ultrasound guidance, a 20 gauge needle was  advanced into the renal collecting system. An ultrasound image documentation was performed. Over a Nitrex wire, the tract was dilated with an Accustick stent. Next, under intermittent fluoroscpic guidance and over a short Amplatz wire, the track was dilated and a 7 Fr, 45 cm Tourguide sheath was inserted. Through the sheath, a 6 Fr, 3.5 cm loop snare was inserted, the proximal aspect of the ureteral stent was grasped and removed intact. The Amplatz wire was reinserted and the sheath was exchanged for a 10-French percutaneous nephrostomy catheter which was advanced to the level of the renal pelvis where the coil was formed and locked. Contrast was injected and confirmed location within the renal pelvis. The catheter was secured at the skin with a 0-silk retention suture and stat lock device and connected to a gravity bag was placed. Dressings were applied. The right flank region was prepped and draped in the usual sterile fashion and a sterile drape was applied covering the operative field. A sterile gown and  sterile gloves were used for the procedure. Local anesthesia was provided with 1% Lidocaine with epinephrine. Ultrasound was used to localize the right kidney. Under direct ultrasound guidance, a 20 gauge needle was advanced into the renal collecting system. An ultrasound image documentation was performed. Over a Nitrex wire, the tract was dilated with an Accustick stent. Next, under intermittent fluoroscpic guidance and over a short Amplatz wire, the track was dilated and a 7 Fr, 45 cm Tourguide sheath was inserted. Through the sheath, a 6 Fr, 3.5 cm loop snare was inserted, the proximal aspect of the ureteral stent was grasped and removed intact. The Amplatz wire was reinserted and the sheath was exchanged for a 10-French percutaneous nephrostomy catheter which was advanced to the level of the renal pelvis where the coil was formed and locked. Contrast was injected and confirmed location within the renal  pelvis. The catheter was secured at the skin with a 0-silk retention suture and stat lock device and connected to a gravity bag was placed. Dressings were applied. The patient tolerated procedure well without immediate postprocedural complication. FINDINGS: Ultrasound scanning demonstrates a minimally dilated left collecting system and moderated dilated right collecting system with diffuse parenchymal edema. IMPRESSION: 1. Successful ultrasound and fluoroscopic guided left ureteral stent retrieval placement of a left 10 French percutaneous nephrostomy. 2. Successful ultrasound and fluoroscopic guided right ureteral stent retrieval placement of a right 10 French percutaneous nephrostomy. Marliss Coots, MD [facility] Electronically Signed   By: Marliss Coots M.D.   On: 07/18/2023 08:18   DG Chest Port 1 View Result Date: 07/17/2023 CLINICAL DATA:  Evaluate ET tube placement EXAM: PORTABLE CHEST 1 VIEW COMPARISON:  07/16/2023 FINDINGS: The endotracheal tube tip is above the carina. There is a right IJ catheter with tip in the superior cavoatrial junction. Enteric tube tip and side port are below the GE junction. Stable cardiomediastinal contours. No pleural fluid, interstitial edema or airspace disease. IMPRESSION: 1. Satisfactory position of endotracheal tube. 2. No acute cardiopulmonary abnormalities. Electronically Signed   By: Signa Kell M.D.   On: 07/17/2023 07:40   DG CHEST PORT 1 VIEW Result Date: 07/17/2023 CLINICAL DATA:  Central line EXAM: PORTABLE CHEST 1 VIEW COMPARISON:  Chest x-ray 07/16/2023 FINDINGS: Endotracheal tube tip is 2.4 cm above the carina. New right-sided central venous catheter tip ends in the SVC. Enteric tube extends below the diaphragm. The heart size and mediastinal contours are within normal limits. Both lungs are clear. There is no pleural effusion or pneumothorax. The visualized skeletal structures are unremarkable.  IMPRESSION: New right-sided central venous catheter tip ends in the SVC. No pneumothorax. Electronically Signed   By: Darliss Cheney M.D.   On: 07/17/2023 01:11   CT CHEST ABDOMEN PELVIS WO CONTRAST Result Date: 07/16/2023 CLINICAL DATA:  Sepsis, delirium EXAM: CT CHEST, ABDOMEN AND PELVIS WITHOUT CONTRAST TECHNIQUE: Multidetector CT imaging of the chest, abdomen and pelvis was performed following the standard protocol without IV contrast. RADIATION DOSE REDUCTION: This exam was performed according to the departmental dose-optimization program which includes automated exposure control, adjustment of the mA and/or kV according to patient size and/or use of iterative reconstruction technique. COMPARISON:  Chest radiograph 07/16/2023; CT abdomen and pelvis 08/05/2021 and CT chest 08/18/2020 FINDINGS: CT CHEST FINDINGS Cardiovascular: Normal heart size.  No pericardial effusion. Mediastinum/Nodes: Endotracheal tube tip in the intrathoracic trachea. Enteric tube tip in the stomach. Small amount of pneumomediastinum. No definite thoracic adenopathy Lungs/Pleura: Bilateral centrilobular micro nodules and tree-in-bud  opacities greatest in the bilateral lower lobes compatible with small airway infection/inflammation and/or aspiration. No pleural effusion or pneumothorax. Musculoskeletal: No acute fracture. CT ABDOMEN PELVIS FINDINGS Hepatobiliary: Unremarkable noncontrast appearance of the liver. Cholecystectomy. No biliary dilation. Pancreas: Unremarkable. Spleen: Unremarkable. Adrenals/Urinary Tract: Stable adrenal glands. Bilateral ureteral stents. Bilateral hydronephrosis is similar to 08/05/2021. Thick-walled nondistended urinary bladder about a Foley catheter. Locules of gas in the bladder. Stomach/Bowel: No bowel obstruction or bowel wall thickening. The appendix is normal. Stomach is within normal limits. Vascular/Lymphatic: Similar prominent retroperitoneal lymph nodes. For example, 1.0 cm left para-aortic lymph  node. Bilateral inguinal adenopathy measuring 1.7 cm on the left and 1.8 cm on the right is similar to decreased compared to 08/05/2021. No new or enlarging thoracic adenopathy. Unremarkable noncontrast appearance of the vasculature. Reproductive: Unremarkable. Other: Small volume free fluid in the pelvis. No free intraperitoneal air. Musculoskeletal: No acute fracture. IMPRESSION: 1. Bilateral centrilobular micro nodules and tree-in-bud opacities greatest in the bilateral lower lobes compatible with small airway infection/inflammation and/or aspiration. 2. Small amount of pneumomediastinum. 3. Thick-walled nondistended urinary bladder about a Foley catheter. Correlate with urinalysis to exclude cystitis. 4. Bilateral ureteral stents. Bilateral hydronephrosis is similar to 08/05/2021. 5. Similar prominent retroperitoneal lymph nodes and decreased inguinal lymph nodes. Electronically Signed   By: Minerva Fester M.D.   On: 07/16/2023 22:13   CT Head Wo Contrast Result Date: 07/16/2023 CLINICAL DATA:  Delirium and sepsis EXAM: CT HEAD WITHOUT CONTRAST TECHNIQUE: Contiguous axial images were obtained from the base of the skull through the vertex without intravenous contrast. RADIATION DOSE REDUCTION: This exam was performed according to the departmental dose-optimization program which includes automated exposure control, adjustment of the mA and/or kV according to patient size and/or use of iterative reconstruction technique. COMPARISON:  None Available. FINDINGS: Brain: There is no mass, hemorrhage or extra-axial collection. Normal appearance of the white matter with preserved gray-white differentiation. Normal CSF spaces. Vascular: Negative Skull: Negative Sinuses/Orbits: Moderate paranasal sinus disease. Other: Endotracheal intubation IMPRESSION: 1. No acute intracranial abnormality. 2. Diffuse, moderately severe paranasal sinus disease. Electronically Signed   By: Deatra Robinson M.D.   On: 07/16/2023 21:13    DG Chest Portable 1 View Result Date: 07/16/2023 CLINICAL DATA:  Shortness of breath: ETT placement EXAM: PORTABLE CHEST 1 VIEW COMPARISON:  Same day chest radiograph FINDINGS: Stable cardiomediastinal silhouette. Aortic atherosclerotic calcification. No focal consolidation, pleural effusion, or pneumothorax. No displaced rib fractures. Subdiaphragmatic enteric tube with side port in the stomach. Endotracheal tube tip in the mid intrathoracic trachea proximally cm from the carina. IMPRESSION: Endotracheal tube tip in the mid intrathoracic trachea. Subdiaphragmatic enteric tube. Otherwise no change from radiographs earlier today. Electronically Signed   By: Minerva Fester M.D.   On: 07/16/2023 20:57   DG Chest 1 View Result Date: 07/16/2023 CLINICAL DATA:  Reason for exam: Patient order states screening. Best obtainable images due to patient cooperation and condition. EXAM: CHEST  1 VIEW COMPARISON:  11/24/2007 radiographic and 08/18/2020 CT chest FINDINGS: Normal cardiomediastinal silhouette. No focal consolidation, pleural effusion, or pneumothorax. No displaced rib fractures. IMPRESSION: No acute cardiopulmonary disease. Electronically Signed   By: Minerva Fester M.D.   On: 07/16/2023 20:54      Assessment/Plan: 12/18: sepsis and hydronephrosis Bilateral percutaneous nephrostomy tubes and bilateral ureteral stents removed 12/17 by Dr. Andrey Campanile.  Tubes are in place and attached to gravity bags. Sites have overlying clean and dry dressings. Blood tinged output noted. I/O left nephrostomy tube: 30mL and right: 35mL  within the last 24 hours. Continue gravity bags and monitor output. Appreciate urology and critical care assistance. IR will continue to follow the patient.     LOS: 2 days   I spent a total of 20 minutes in face to face in clinical consultation, greater than 50% of which was counseling/coordinating care for Bilateral percutaneous nephrostomy tube placement with bilateral  ureteral stent removal.   Arilyn Brierley N Delmar Arriaga PA-C 07/18/2023 12:42 PM

## 2023-07-18 NOTE — Procedures (Signed)
Admit: 07/16/2023 LOS: [age]F with chronic obstructive uropathy with b/l stents, hx/o cervical CA, CKD4 here with AKI, metabolic acidosis, hyperkalemia, AMS, VDRF, septic shock from ?UTI  Current CRRT Prescription: Start Date: 07/17/23 Catheter: R internal jugular Temp HD cath placed 07/16/23 CCM BFR: 250 Pre Blood Pump: 4K 800 DFR: 200 4K Replacement Rate: 4K 400 Goal UF: net even Anticoagulation: fixed dose heparin Clotting: none while on fixed dose heparin  S: D/w RN and primary MD overnight issues and plan Stable on CRRT, net even, no clotting with heparin S/p b/l PCNs and ureteral stents removed with IR yesterday Very little UOP, oliguric at most On low dose NE P 2.4 and K 4.4  O: 12/17 0701 - 12/18 0700 In: 2898.4 [I.V.:1241.9; NG/GT:636.7; IV Piggyback:999.9] Out: 1730.6 [Urine:140]  Filed Weights   07/16/23 1643 07/17/23 0413 07/18/23 0416  Weight: 70.3 kg 68.8 kg 67.7 kg    Recent Labs  Lab 07/17/23 0322 07/17/23 1016 07/17/23 1418 07/18/23 0346  NA 138 139 134* 135  K 4.3 3.5 3.9 4.4  CL 106  --  102 101  CO2 14*  --  21* 22  GLUCOSE 98  --  98 110*  BUN 133*  --  67* 33*  CREATININE 9.56*  --  4.93* 2.47*  CALCIUM 7.5*  --  7.6* 8.0*  PHOS 8.1*  --  4.1 2.4*   Recent Labs  Lab 07/16/23 1652 07/16/23 1658 07/17/23 0455 07/17/23 1016 07/18/23 0346  WBC 46.1*  --  31.3*  --  36.1*  NEUTROABS 42.0*  --   --   --   --   HGB 10.8*   < > 8.2* 8.8* 7.5*  HCT 36.0   < > 25.0* 26.0* 23.6*  MCV 95.2  --  88.7  --  92.5  PLT 704*  --  530*  --  348   < > = values in this interval not displayed.    Scheduled Meds:  Chlorhexidine Gluconate Cloth  6 each Topical Daily   Chlorhexidine Gluconate Cloth  6 each Topical Q0600   docusate  100 mg Per Tube BID   etomidate  0.3 mg/kg Intravenous Once   feeding supplement (PROSource TF20)  60 mL Per Tube BID   heparin  5,000 Units Subcutaneous Q8H   insulin aspart  0-9 Units Subcutaneous Q4H   multivitamin   1 tablet Per Tube QHS   mupirocin ointment  1 Application Nasal BID   mouth rinse  15 mL Mouth Rinse Q2H   pantoprazole (PROTONIX) IV  40 mg Intravenous QHS   polyethylene glycol  17 g Per Tube Daily   rocuronium  1 mg/kg Intravenous Once   sodium chloride flush  5 mL Intracatheter Q8H   Continuous Infusions:   prismasol BGK 4/2.5 800 mL/hr at 07/18/23 0010    prismasol BGK 4/2.5 400 mL/hr at 07/18/23 0010   ceFEPime (MAXIPIME) IV Stopped (07/18/23 0530)   feeding supplement (VITAL 1.5 CAL) 30 mL/hr at 07/18/23 0913   fentaNYL infusion INTRAVENOUS 100 mcg/hr (07/18/23 0900)   heparin 10,000 units/ 20 mL infusion syringe 550 Units/hr (07/17/23 2137)   linezolid (ZYVOX) IV 600 mg (07/18/23 0910)   metronidazole Stopped (07/18/23 0318)   norepinephrine (LEVOPHED) Adult infusion 2 mcg/min (07/18/23 0900)   prismasol BGK 4/2.5 2,000 mL/hr at 07/18/23 0730   propofol (DIPRIVAN) infusion 15 mcg/kg/min (07/18/23 0900)   PRN Meds:.fentaNYL, heparin, heparin, midazolam, mouth rinse  ABG    Component Value Date/Time  PHART 6.935 (LL) 07/16/2023 2039   PCO2ART 49.6 (H) 07/16/2023 2039   PO2ART 497 (H) 07/16/2023 2039   HCO3 23.7 07/17/2023 1016   TCO2 25 07/17/2023 1016   ACIDBASEDEF 3.0 (H) 07/17/2023 1016   O2SAT 79 07/17/2023 1016    A 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

## 2023-07-18 NOTE — Progress Notes (Signed)
[facility]  Date of Admission:  07/16/2023     Lines:  12/17-c bilateral nephrostomy tubes 12/16-c right internal jugular hd catheter     Abx: 12/17-c linezolid 12/16-c cefepime 12/17-c metronidazole     12/16 vanc                                                           Assessment: 42 yo female with hx cervical cancer s/p chemoxrt and brachytherapy (unclear status of disease), presence of bilateral ureteral stent for distal ureteral stricture/obstructive uropathy (stents in for a year), ckd stage 4, admitted with acute ams, abd pain, hematuria, found to have:   Septic shock -- bp 42/16 on presentation cellulitis Group a strep bacteremia Hypoxic respiratory failure -- intubated on presentation in setting septic shock Ct imaging distended bladder and tree in bud bilateral basal lungs  AKI     12/16 mrsa nares pcr positive 12/17 ucx in progress 12/17 respiratory culture (BAL) in progress 12/16 bcx 1 of 2 set with group a strep by bcid   Source for group a strep might be lower ext open wound/cellulitis. No sign of nec fasc now but would need to closely monitor   Doesn't appear to have toxic shock syndrome at this time     She has chronic intermittent dysuria in setting of cervical cancer/xrt and bilateral ureteral stricture. No clinical change per history. Ct showed stable hydronephrosis   Bilateral basilar chest ct finding tree in bud ?aspiration. S/p BAL sampling     At this time covering broadly with abx    ------------ 12/18 id assessment Off pressors this morning Repeat bcx 12/17 CoNS likely contaminant 12/17 BAL cx ngtd 12/17 s/p ureteral stent removal bilaterally and placement perc-nephrostomy tubes 12/17 respiratory viral pcr rhino virus  12/17 ucx multiple species    Bilateral LE open wounds/scabs and well marked erythema without fluctuance/blister.   Suspect source of infection/sepsis mainly driven by group  a strep process with rhinovirus minorly contribute and explaining tree in bud changes on chest imaging   Urine process chronic and doesn't appear to contribute  Off crrt for the latter part of today    Plan: If tomorrow all culture remains negative can change cefepime to penicillin. Would continue linezolid / cefepime for now and stop metronidazole F/u bal and final bcx report Supportive care per pulm ccm team Please continue to leave legs dressing off to observe for sign cellulitis complication such as Arther Dames fasc Discussed with primary team   Principal Problem:   Sepsis due to urinary tract infection (HCC) Active Problems:   Acute respiratory failure (HCC)   Altered mental status   Allergies  Allergen Reactions   Imitrex [Sumatriptan Base] Palpitations    Heart races   Cephalexin Rash    Patient passed amoxicillin challenge on 08/18/20, no adverse effect    Scheduled Meds:  Chlorhexidine Gluconate Cloth  6 each Topical Daily   Chlorhexidine Gluconate Cloth  6 each Topical Q0600   docusate  100 mg Per Tube BID   etomidate  0.3 mg/kg Intravenous Once   feeding supplement (PROSource TF20)  60 mL Per Tube BID   heparin  5,000 Units Subcutaneous Q8H   insulin aspart  0-9 Units Subcutaneous Q4H  multivitamin  1 tablet Per Tube QHS   mupirocin ointment  1 Application Nasal BID   mouth rinse  15 mL Mouth Rinse Q2H   pantoprazole (PROTONIX) IV  40 mg Intravenous QHS   polyethylene glycol  17 g Per Tube Daily   rocuronium  1 mg/kg Intravenous Once   sodium chloride flush  5 mL Intracatheter Q8H   Continuous Infusions:   prismasol BGK 4/2.5 800 mL/hr at 07/18/23 1309    prismasol BGK 4/2.5 400 mL/hr at 07/18/23 1309   ceFEPime (MAXIPIME) IV Stopped (07/18/23 0530)   feeding supplement (VITAL 1.5 CAL) 30 mL/hr at 07/18/23 1600   fentaNYL infusion INTRAVENOUS 150 mcg/hr (07/18/23 1600)   heparin 10,000 units/ 20 mL infusion syringe 550 Units/hr (07/17/23 2137)   linezolid  (ZYVOX) IV Stopped (07/18/23 1010)   metronidazole 100 mL/hr at 07/18/23 1600   norepinephrine (LEVOPHED) Adult infusion Stopped (07/18/23 1520)   prismasol BGK 4/2.5 2,000 mL/hr at 07/18/23 1310   propofol (DIPRIVAN) infusion 35 mcg/kg/min (07/18/23 1600)   PRN Meds:.fentaNYL, heparin, heparin, midazolam, mouth rinse   SUBJECTIVE: Off pressors, remains sedated/intubated Repeat bcx contaminant staph epi Ucx multiple species Bal cx ngtd    Review of Systems: ROS All other ROS was negative, except mentioned above     OBJECTIVE: Vitals:   07/18/23 1515 07/18/23 1530 07/18/23 1545 07/18/23 1600  BP: 109/65 103/69 (!) 81/55 97/66  Pulse: (!) 105 (!) 109 94 (!) 109  Resp: (!) 25 (!) 24 (!) 22 (!) 24  Temp: 98.8 F (37.1 C)     TempSrc: Axillary     SpO2: 96% 96% 94% 95%  Weight:      Height:       Body mass index is 24.09 kg/m.  Physical Exam General/constitutional: ill appearing, intubated, sedated/comatose HEENT: Normocephalic, PER, Conj Clear CV: rrr no mrg Lungs: clear on ven -- minimal vent setting 40% fio2 Abd: Soft, Nontender Skin/Ext: brawny bilateral lower edema with well demarcated bilateral erythema and circular open wound/scab Neuro: sedated/intubated MSK: no peripheral joint swelling/warmth  Gu bilateral nephrostomy tube draining urine  Central line presence: right internal jugular hd cath site no redness/purulence   Lab Results Lab Results  Component Value Date   WBC 36.1 (H) 07/18/2023   HGB 7.5 (L) 07/18/2023   HCT 23.6 (L) 07/18/2023   MCV 92.5 07/18/2023   PLT 348 07/18/2023    Lab Results  Component Value Date   CREATININE 1.97 (H) 07/18/2023   BUN 20 07/18/2023   NA 134 (L) 07/18/2023   K 3.8 07/18/2023   CL 101 07/18/2023   CO2 24 07/18/2023    Lab Results  Component Value Date   ALT 15 07/16/2023   AST 24 07/16/2023   ALKPHOS 127 (H) 07/16/2023   BILITOT 0.8 07/16/2023      Microbiology: Recent Results (from the past  240 hours)  Resp panel by RT-PCR (RSV, Flu A&B, Covid) Anterior Nasal Swab     Status: None   Collection Time: 07/16/23  4:49 PM   Specimen: Anterior Nasal Swab  Result Value Ref Range Status   SARS Coronavirus 2 by RT PCR NEGATIVE NEGATIVE Final   Influenza A by PCR NEGATIVE NEGATIVE Final   Influenza B by PCR NEGATIVE NEGATIVE Final    Comment: (NOTE) The Xpert Xpress SARS-CoV-2/FLU/RSV plus assay is intended as an aid in the diagnosis of influenza from Nasopharyngeal swab specimens and should not be used as a sole basis for treatment. Nasal washings and  aspirates are unacceptable for Xpert Xpress SARS-CoV-2/FLU/RSV testing.  Fact Sheet for Patients: BloggerCourse.com  Fact Sheet for Healthcare Providers: SeriousBroker.it  This test is not yet approved or cleared by the [place] FDA and has been authorized for detection and/or diagnosis of SARS-CoV-2 by FDA under an Emergency Use Authorization (EUA). This EUA will remain in effect (meaning this test can be used) for the duration of the COVID-19 declaration under Section 564(b)(1) of the Act, 21 U.S.C. section 360bbb-3(b)(1), unless the authorization is terminated or revoked.     Resp Syncytial Virus by PCR NEGATIVE NEGATIVE Final    Comment: (NOTE) Fact Sheet for Patients: BloggerCourse.com  Fact Sheet for Healthcare Providers: SeriousBroker.it  This test is not yet approved or cleared by the [place] FDA and has been authorized for detection and/or diagnosis of SARS-CoV-2 by FDA under an Emergency Use Authorization (EUA). This EUA will remain in effect (meaning this test can be used) for the duration of the COVID-19 declaration under Section 564(b)(1) of the Act, 21 U.S.C. section 360bbb-3(b)(1), unless the authorization is terminated or revoked.  Performed at [facility], [address],  [place], [place] [address]   Blood culture (routine x 2)     Status: Abnormal (Preliminary result)   Collection Time: 07/16/23  4:50 PM   Specimen: BLOOD  Result Value Ref Range Status   Specimen Description BLOOD SITE NOT SPECIFIED  Final   Special Requests   Final    BOTTLES DRAWN AEROBIC ONLY Blood Culture adequate volume   Culture  Setup Time   Final    GRAM POSITIVE COCCI IN CHAINS ANAEROBIC BOTTLE ONLY CRITICAL RESULT CALLED TO, READ BACK BY AND VERIFIED WITH: PHARMD ELIZABETH M. [id number] FCP GRAM POSITIVE COCCI IN CLUSTERS BOTTLES DRAWN AEROBIC ONLY CRITICAL RESULT CALLED TO, READ BACK BY AND VERIFIED WITH: PHARMD JESSICA MILLEN ON 07/17/23 @ 1723 BY DRT    Culture (A)  Final    STAPHYLOCOCCUS EPIDERMIDIS THE SIGNIFICANCE OF ISOLATING THIS ORGANISM FROM A SINGLE SET OF BLOOD CULTURES WHEN MULTIPLE SETS ARE DRAWN IS UNCERTAIN. PLEASE NOTIFY THE MICROBIOLOGY DEPARTMENT WITHIN ONE WEEK IF SPECIATION AND SENSITIVITIES ARE REQUIRED. GROUP A STREP (S.PYOGENES) ISOLATED SUSCEPTIBILITIES TO FOLLOW HEALTH DEPARTMENT NOTIFIED Performed at [facility], [address] [place], [place] [address]    Report Status PENDING  Incomplete  Blood Culture ID Panel (Reflexed)     Status: Abnormal   Collection Time: 07/16/23  4:50 PM  Result Value Ref Range Status   Enterococcus faecalis NOT DETECTED NOT DETECTED Final   Enterococcus Faecium NOT DETECTED NOT DETECTED Final   Listeria monocytogenes NOT DETECTED NOT DETECTED Final   Staphylococcus species NOT DETECTED NOT DETECTED Final   Staphylococcus aureus (BCID) NOT DETECTED NOT DETECTED Final   Staphylococcus epidermidis NOT DETECTED NOT DETECTED Final   Staphylococcus lugdunensis NOT DETECTED NOT DETECTED Final   Streptococcus species DETECTED (A) NOT DETECTED Final    Comment: CRITICAL RESULT CALLED TO, READ BACK BY AND VERIFIED WITH: PHARMD ELIZABETH M. [id number] FCP    Streptococcus agalactiae NOT DETECTED NOT DETECTED Final    Streptococcus pneumoniae NOT DETECTED NOT DETECTED Final   Streptococcus pyogenes DETECTED (A) NOT DETECTED Final    Comment: CRITICAL RESULT CALLED TO, READ BACK BY AND VERIFIED WITH: PHARMD ELIZABETH M. [id number] FCP    A.calcoaceticus-baumannii NOT DETECTED NOT DETECTED Final   Bacteroides fragilis NOT DETECTED NOT DETECTED Final   Enterobacterales NOT DETECTED NOT DETECTED Final  Enterobacter cloacae complex NOT DETECTED NOT DETECTED Final   Escherichia coli NOT DETECTED NOT DETECTED Final   Klebsiella aerogenes NOT DETECTED NOT DETECTED Final   Klebsiella oxytoca NOT DETECTED NOT DETECTED Final   Klebsiella pneumoniae NOT DETECTED NOT DETECTED Final   Proteus species NOT DETECTED NOT DETECTED Final   Salmonella species NOT DETECTED NOT DETECTED Final   Serratia marcescens NOT DETECTED NOT DETECTED Final   Haemophilus influenzae NOT DETECTED NOT DETECTED Final   Neisseria meningitidis NOT DETECTED NOT DETECTED Final   Pseudomonas aeruginosa NOT DETECTED NOT DETECTED Final   Stenotrophomonas maltophilia NOT DETECTED NOT DETECTED Final   Candida albicans NOT DETECTED NOT DETECTED Final   Candida auris NOT DETECTED NOT DETECTED Final   Candida glabrata NOT DETECTED NOT DETECTED Final   Candida krusei NOT DETECTED NOT DETECTED Final   Candida parapsilosis NOT DETECTED NOT DETECTED Final   Candida tropicalis NOT DETECTED NOT DETECTED Final   Cryptococcus neoformans/gattii NOT DETECTED NOT DETECTED Final    Comment: Performed at [facility], [address], [place], [place] [address]  MRSA Next Gen by PCR, Nasal     Status: Abnormal   Collection Time: 07/16/23  9:37 PM   Specimen: Nasal Mucosa; Nasal Swab  Result Value Ref Range Status   MRSA by PCR Next Gen DETECTED (A) NOT DETECTED Final    Comment: RESULT CALLED TO, READ BACK BY AND VERIFIED WITH: B SCOTT  RN 07/16/2023 @ 2325 BY AB (NOTE) The GeneXpert MRSA Assay (FDA approved for NASAL specimens only), is one  component of a comprehensive MRSA colonization surveillance program. It is not intended to diagnose MRSA infection nor to guide or monitor treatment for MRSA infections. Test performance is not FDA approved in patients less than 38 years old. Performed at [facility], [address], [place], [place] [address]   Blood culture (routine x 2)     Status: None (Preliminary result)   Collection Time: 07/16/23 10:36 PM   Specimen: BLOOD RIGHT HAND  Result Value Ref Range Status   Specimen Description BLOOD RIGHT HAND  Final   Special Requests   Final    BOTTLES DRAWN AEROBIC AND ANAEROBIC Blood Culture results may not be optimal due to an inadequate volume of blood received in culture bottles   Culture   Final    NO GROWTH 2 DAYS Performed at [facility], [address], [place], [place] [address]    Report Status PENDING  Incomplete  Urine Culture (for pregnant, neutropenic or urologic patients or patients with an indwelling urinary catheter)     Status: Abnormal   Collection Time: 07/17/23  7:33 AM   Specimen: Urine, Catheterized  Result Value Ref Range Status   Specimen Description URINE, CATHETERIZED  Final   Special Requests   Final    NONE Performed at [facility], [address] [place], [place] [address]    Culture MULTIPLE SPECIES PRESENT, SUGGEST RECOLLECTION (A)  Final   Report Status 07/18/2023 FINAL  Final  Respiratory (~20 pathogens) panel by PCR     Status: Abnormal   Collection Time: 07/17/23 10:58 AM   Specimen: Nasopharyngeal Swab; Respiratory  Result Value Ref Range Status   Adenovirus NOT DETECTED NOT DETECTED Final   Coronavirus 229E NOT DETECTED NOT DETECTED Final    Comment: (NOTE) The Coronavirus on the Respiratory Panel, DOES NOT test for the novel  Coronavirus (2019 nCoV)    Coronavirus HKU1 NOT DETECTED  NOT DETECTED Final   Coronavirus NL63 NOT DETECTED NOT DETECTED Final   Coronavirus OC43 NOT DETECTED NOT DETECTED Final    Metapneumovirus NOT DETECTED NOT DETECTED Final   Rhinovirus / Enterovirus DETECTED (A) NOT DETECTED Final   Influenza A NOT DETECTED NOT DETECTED Final   Influenza B NOT DETECTED NOT DETECTED Final   Parainfluenza Virus 1 NOT DETECTED NOT DETECTED Final   Parainfluenza Virus 2 NOT DETECTED NOT DETECTED Final   Parainfluenza Virus 3 NOT DETECTED NOT DETECTED Final   Parainfluenza Virus 4 NOT DETECTED NOT DETECTED Final   Respiratory Syncytial Virus NOT DETECTED NOT DETECTED Final   Bordetella pertussis NOT DETECTED NOT DETECTED Final   Bordetella Parapertussis NOT DETECTED NOT DETECTED Final   Chlamydophila pneumoniae NOT DETECTED NOT DETECTED Final   Mycoplasma pneumoniae NOT DETECTED NOT DETECTED Final    Comment: Performed at [facility], [address], [place], [place] [address]  Culture, Respiratory w Gram Stain     Status: None (Preliminary result)   Collection Time: 07/17/23 11:14 AM   Specimen: Bronchoalveolar Lavage; Respiratory  Result Value Ref Range Status   Specimen Description BRONCHIAL ALVEOLAR LAVAGE  Final   Special Requests Normal  Final   Gram Stain   Final    ABUNDANT WBC PRESENT, PREDOMINANTLY PMN NO ORGANISMS SEEN    Culture   Final    RARE STAPHYLOCOCCUS AUREUS SUSCEPTIBILITIES TO FOLLOW Performed at [facility], [address], [place], [place] [address]    Report Status PENDING  Incomplete  Blood Culture ID Panel (Reflexed)     Status: Abnormal   Collection Time: 07/17/23  6:57 PM  Result Value Ref Range Status   Enterococcus faecalis NOT DETECTED NOT DETECTED Final   Enterococcus Faecium NOT DETECTED NOT DETECTED Final   Listeria monocytogenes NOT DETECTED NOT DETECTED Final   Staphylococcus species DETECTED (A) NOT DETECTED Final    Comment: CRITICAL RESULT CALLED TO, READ BACK BY AND VERIFIED WITH: PHARMD JESSICA MILLEN ON 07/17/23 @ 1700 BY DRT    Staphylococcus aureus (BCID) NOT DETECTED NOT DETECTED Final   Staphylococcus  epidermidis DETECTED (A) NOT DETECTED Final    Comment: Methicillin (oxacillin) resistant coagulase negative staphylococcus. Possible blood culture contaminant (unless isolated from more than one blood culture draw or clinical case suggests pathogenicity). No antibiotic treatment is indicated for blood  culture contaminants. PHARMD JESSICA MILLEN ON 07/17/23 @ 1700 BY DRT    Staphylococcus lugdunensis NOT DETECTED NOT DETECTED Final   Streptococcus species NOT DETECTED NOT DETECTED Final   Streptococcus agalactiae NOT DETECTED NOT DETECTED Final   Streptococcus pneumoniae NOT DETECTED NOT DETECTED Final   Streptococcus pyogenes NOT DETECTED NOT DETECTED Final   A.calcoaceticus-baumannii NOT DETECTED NOT DETECTED Final   Bacteroides fragilis NOT DETECTED NOT DETECTED Final   Enterobacterales NOT DETECTED NOT DETECTED Final   Enterobacter cloacae complex NOT DETECTED NOT DETECTED Final   Escherichia coli NOT DETECTED NOT DETECTED Final   Klebsiella aerogenes NOT DETECTED NOT DETECTED Final   Klebsiella oxytoca NOT DETECTED NOT DETECTED Final   Klebsiella pneumoniae NOT DETECTED NOT DETECTED Final   Proteus species NOT DETECTED NOT DETECTED Final   Salmonella species NOT DETECTED NOT DETECTED Final   Serratia marcescens NOT DETECTED NOT DETECTED Final   Haemophilus influenzae NOT DETECTED NOT DETECTED Final   Neisseria meningitidis NOT DETECTED NOT DETECTED Final   Pseudomonas aeruginosa NOT DETECTED NOT DETECTED Final   Stenotrophomonas maltophilia NOT DETECTED NOT DETECTED Final  Candida albicans NOT DETECTED NOT DETECTED Final   Candida auris NOT DETECTED NOT DETECTED Final   Candida glabrata NOT DETECTED NOT DETECTED Final   Candida krusei NOT DETECTED NOT DETECTED Final   Candida parapsilosis NOT DETECTED NOT DETECTED Final   Candida tropicalis NOT DETECTED NOT DETECTED Final   Cryptococcus neoformans/gattii NOT DETECTED NOT DETECTED Final   Methicillin resistance mecA/C DETECTED  (A) NOT DETECTED Final    Comment: CRITICAL RESULT CALLED TO, READ BACK BY AND VERIFIED WITH: PHARMD JESSICA MILLEN ON 07/17/23 @ 1700 BY DRT Performed at [facility], [address], [place], [place] [address]      Serology:   Imaging: If present, new imagings (plain films, ct scans, and mri) have been personally visualized and interpreted; radiology reports have been reviewed. Decision making incorporated into the Impression / Recommendations.  07/17/23 cxr IMPRESSION: 1. Satisfactory position of endotracheal tube. 2. No acute cardiopulmonary abnormalities.     12/16 head ct 1. No acute intracranial abnormality. 2. Diffuse, moderately severe paranasal sinus disease.   12/16 ct abd pelv chest 1. Bilateral centrilobular micro nodules and tree-in-bud opacities greatest in the bilateral lower lobes compatible with small airway infection/inflammation and/or aspiration. 2. Small amount of pneumomediastinum. 3. Thick-walled nondistended urinary bladder about a Foley catheter. Correlate with urinalysis to exclude cystitis. 4. Bilateral ureteral stents. Bilateral hydronephrosis is similar to 08/05/2021. 5. Similar prominent retroperitoneal lymph nodes and decreased inguinal lymph nodes.       Raymondo Band, MD [facility] [phone] pager    07/18/2023, 5:03 PM

## 2023-07-18 NOTE — TOC Progression Note (Signed)
Transition of Care [facility]) - Progression Note    Patient Details  Name: Marie Brewer MRN: [id number] Date of Birth: [birth year]  Transition of Care [facility]) CM/SW Contact  Harriet Masson, RN Phone Number: 07/18/2023, 3:34 PM  Clinical Narrative:    Sherron Monday to husband, Mellody Dance, regarding disability for patient.  This RNCM gave Mellody Dance number to social security office.  Mellody Dance is unaware if patient has a PCP.  TOC will continue to follow patient for needs.    Barriers to Discharge: Continued Medical Work up  Expected Discharge Plan and Services       Living arrangements for the past 2 months: Apartment                                       Social Determinants of Health (SDOH) Interventions SDOH Screenings   Tobacco Use: High Risk (07/17/2023)    Readmission Risk Interventions     No data to display

## 2023-07-18 NOTE — Progress Notes (Signed)
Brief Nutrition Support Note  Pt started on trickles TF 12/17. Discussed with RN who reports they were tolerated well overnight. Pt with decreased pressor support this AM. Discussed with MD, ok to start advancing. Pt remains on CRRT and noted that phosphorus low this AM and K did drop. Could be related to CRRT but also could be refeeding. Labs scheduled to be redraw at 1600. Will monitor for signs of refeeding and advance slowly.   Continue to advance TF via NGT: Vital 1.5 at 50 ml/h (1200 ml per day) Advance to 30mL and then continue to advance by 10mL q12h to goal of 50 Prosource TF20 60 ml BID Provides 1960 kcal, 121 gm protein, 917 ml free water daily   Greig Castilla, RD, LDN Registered Dietitian II Please reach out via secure chat Weekend on-call pager # available in [facility]

## 2023-07-18 NOTE — Progress Notes (Addendum)
NAME:  Marie Brewer, MRN:  [id number], DOB:  13-Jan-1981, LOS: 2 ADMISSION DATE:  07/16/2023, CONSULTATION DATE:  07/16/23 REFERRING MD:  [facility]-EDP, CHIEF COMPLAINT:  Sepsis, presumed urosepsis   History of present illness   42 year old woman who presented to [facility] ED 12/16 for unresponsiveness. PMHx significant for CKD stage 4, bilateral ureteral obstruction (s/p stent placement 10/2022), cervical CA stage IIB (s/p chemotherapy/XRT, followed at [facility]), polysubstance abuse (current heroin use via inhalation).   Patient presented to [facility] ED with decreased responsiveness x 3 days, per patient's husband (at bedside). Has not been feeling well for about a month but is often hesitant to come to the ED and was adamantly refusing going to the hospital. PO intake has been poor, has been only drinking [facility] and not eating much. No reported fevers at home, but chills/general malaise. Intermittent nausea, no vomiting/diarrhea. Per husband, has complained of abdominal pain/pain with urination. Of note, has bilateral ureteral stents placed in 03/2022; she was supposed to follow up for stent exchange 01/2023 but did not present as scheduled. Additionally, husband notes she continues to use heroin via inhalation, denies injection.   On ED arrival, patient was afebrile, HR 111, BP 126/80, SpO2 100%. She was initially somnolent with GCS 12 but intermittently agitated/not following command, thrashing and causing risk to herself/staff. Ultimately patient was intubated for her safety/airway protection. Labs were notable for WBC 46.1, Hgb 10.8, Plt 704 (suspect concentrated), Na 130, K 5.7, CO2 < 7, BUN 158/Cr 12.13 (baseline ~4). Transaminases/Tbili WNL, Alk Phos mildly elevated. Lipase 133. TSH 4.046. LA 6.1 > 0.8 after IV fluids. CK 198. Ammonia 79, Ethanol < 10, APAP/salicylates negative. UDS pending. VBG 6.974/pCO2 23.4/bicarb 5.4. PCT 29.14, MRSA PCR positive (nares). BCx pending, cefepime/vanc initiated. CT  Chest/A/P demonstrated bilateral centrilobular micronodules (tree-in-bud) greatest in bilateral lower lobes, small amount pneumomediastinum (history of esophageal perf), thick-walled nondistended urinary bladder with Foley, bilateral hydronephrosis with bilateral ureteral stents.   PCCM consulted for ICU admission. Nephro consulted for ARF and emergent CRRT initiation.  Past Medical History  CKD stage IV Bilateral ureteral obstruction status post stent placement April 2024 Cervical cancer stage IIb s/p chemotherapy and radiation Heroin use  Significant Hospital Events   12/16 - Presented to [facility] ED with unresponsiveness, decreased PO intake. Unresponsiveness with intermittent agitation requiring intubation. Found to be in acute renal failure with Cr 12 (baseline ~4), BUN > 100. WBC 46. Briefly required Levophed prior to intubation. Broad-spectrum antibiotic started (cefepime/vanc). RIJ Trialysis catheter placed. CRRT started per Nephro. 12/17- IR placed bilateral percutaneous nephrostomy tubes and removed bilateral ureteral stents  Interim history/subjective:  Mildly tachypnic overnight. Afebrile. Remained on low dose levophed at 3 mcg. Blood cultures with MRSE in 1 bottle, group B strep as well. Rhinovirus detected.  Patient assessed at bedside this AM. She responds to pain in all extremities. Is synchronous with ventilator on fentanyl.  Objective   Blood pressure 104/73, pulse 92, temperature 98.2 F (36.8 C), temperature source Oral, resp. rate (!) 22, height 5\' 6"  (1.676 m), weight 67.7 kg, SpO2 100%.    Vent Mode: PRVC FiO2 (%):  [40 %] 40 % Set Rate:  [20 bmp-28 bmp] 24 bmp Vt Set:  [420 mL-470 mL] 470 mL PEEP:  [5 cmH20] 5 cmH20 Plateau Pressure:  [12 cmH20-22 cmH20] 12 cmH20   Intake/Output Summary (Last 24 hours) at 07/18/2023 0637 Last data filed at 07/18/2023 0600 Gross per 24 hour  Intake 2941.48 ml  Output 1637.3 ml  Net 1304.18  ml   Urine output 425 cc CRRT 1.5  cc  Filed Weights   07/16/23 1643 07/17/23 0413 07/18/23 0416  Weight: 70.3 kg 68.8 kg 67.7 kg    Examination: General:  In bed on vent, synchronous with vent HENT: NCAT ETT in place PULM: CTA B, vent supported breathing CV: RRR, no mgr GI: BS+, soft, nontender MSK: normal bulk and tone, no crepitus appreciated on palpation on lower extremities, skin over bilateral skins with numerous ulcers with eschar and purulence, skin is erythematous and warm Neuro: sedated on vent  Labs   CBC: WBC from 31.3 to 36.1 Hemoglobin from 8.8 to 7.5 Platelets at 530  Basic Metabolic Panel: Bicarb 22 AG 12 BUN 33 K 4.4 Creatinine at 2.47.  Baseline appears to be around 4  Respiratory culture with abundunt WBC  CXR 12/17: No consolidation or edema seen  CT abd/pelvis 12/16 IMPRESSION: 1. Bilateral centrilobular micro nodules and tree-in-bud opacities greatest in the bilateral lower lobes compatible with small airway infection/inflammation and/or aspiration. 2. Small amount of pneumomediastinum. 3. Thick-walled nondistended urinary bladder about a Foley catheter. Correlate with urinalysis to exclude cystitis. 4. Bilateral ureteral stents. Bilateral hydronephrosis is similar to 08/05/2021. 5. Similar prominent retroperitoneal lymph nodes and decreased inguinal lymph nodes.  Resolved Hospital Problem list   Severe metabolic acidosis Uremia Hyperkalemia  Assessment & Plan:  42 y.o. year old female with pmh of cervical cancer s/p chemoradiation complicated by ureteral stenosis with stent placement, heroin abuse who present altered mental status and admitted to ICU 12/16 for septic shock.  Septic shock MRSE bacteremia, Bilateral lower extremity wounds Rhinovirus History of bilateral ureteral obstruction Blood culture grew MRSE and Group A strep, likely from chronic wounds on lower extremities. Viral panel with rhinovirus.  She was able to have stents removed 12/17 and nephrostomy  tubes placed 12/17 with IR. Appreciate ID recommendations in this case, she is now on cefepime, linezolid and metronidazole.   -continue cefepime, linezolid, and metronidazole -follow AFB, fungal culture -Urine cultures pending -appreciate urology and IR assistance in this case -strict I and Os -tube feeds started  Acute renal failure CKD stage IV Electrolytes improved this morning with bicarb of 22 and K at 4.4. BUN at 33.   -continue CRRT per nephrology, may try to pull fluid if blood pressure tolerates -trend RFP this PM -strict I and Os -avoid nephrotoxic agents  Acute hypoxic respiratory failure Concern for aspiration pneumonitis Intubated for airway protection. VBG 12/17 with pH 7.4.  -Full vent support (4-8cc/kg IBWW) -wean FiO2 for O2 sat>90% -Daily WUA/SBT once appropriate from a mental status standpoint  -VAP bundle -PAD protocol: Propofol and fentanyl RASS -1 to -2 -triglyceride while on propofol  Small volume pneumomediastinum Small amount of pneumomediastinum seen on CT chest 12/16. Has history of esophageal perforation and inhales heroin. Repeat CXR 12/17 without abnormabilities.  - Monitor closely for now - repeat cxr 12/19  History of cervical cancer Stage IIb, status post chemoradiation diagnosed in 2011.  Followed with Dr. Bertis Ruddy at [facility], last seen 3/23 with recommendations for CT abd/pelvis with 1/23 CT showing lymphadenopathy. CT 07/16/23 with similar prominent retroperitoneal and decreased inguinal lymph nodes.  -continue outpatient follow-up  History of polysubstance abuse with current heroin use, inhalation Husband endorse heroin use.  -UDS positive for cocaine  Diabetes Blood sugars 70s-100s today.  -SSI  Best practice:  Diet: NPO, tube feeds Pain/Anxiety/Delirium protocol (if indicated): propofol, fentanyl DVT prophylaxis: Heparin GI prophylaxis: PPI Glucose control: SSI Code Status: Full  Family Communication: husband at  bedside Disposition: [facility]. Denessa Cavan, D.O.  Internal Medicine Resident, PGY-3 [facility]  Pager: [phone] 6:37 AM, 07/18/2023

## 2023-07-18 NOTE — Plan of Care (Signed)
  Problem: Safety: Goal: Non-violent Restraint(s) Outcome: Progressing   Problem: Metabolic: Goal: Ability to maintain appropriate glucose levels will improve Outcome: Progressing   Problem: Nutritional: Goal: Maintenance of adequate nutrition will improve Outcome: Progressing   Problem: Skin Integrity: Goal: Risk for impaired skin integrity will decrease Outcome: Progressing   Problem: Tissue Perfusion: Goal: Adequacy of tissue perfusion will improve Outcome: Progressing   Problem: Clinical Measurements: Goal: Respiratory complications will improve Outcome: Progressing

## 2023-07-18 NOTE — Progress Notes (Signed)
VAST consulted to instill TPA to red lumen of temp HDC in patient's right neck. Spoke with ICU RN who stated line gives good blood return and flushes easily, but access pressures are high and interfere with appropriate treatment. MD requested line be TPA'd.  Cath-Flo instilled as charted in [facility] and IV assessment flowsheet and allowed to dwell for over 2 hours. When Cath-Flo removed from red lumen, blood pulled back easily and NS flushed easily.

## 2023-07-18 NOTE — Progress Notes (Signed)
Subjective: Patient remains intubated and sedated.  Still vasopressor dependent  Objective: Vital signs in last 24 hours: Temp:  [97.7 F (36.5 C)-98.2 F (36.8 C)] 97.9 F (36.6 C) (12/18 0715) Pulse Rate:  [86-119] 105 (12/18 1230) Resp:  [18-33] 25 (12/18 1230) BP: (78-122)/(39-91) 96/64 (12/18 1230) SpO2:  [94 %-100 %] 96 % (12/18 1230) FiO2 (%):  [40 %] 40 % (12/18 1022) Weight:  [67.7 kg] 67.7 kg (12/18 0416)  Assessment/Plan: # UTI # Sepsis # Possible pyelonephritis  Urine culture with mixed species.  Unlikely to obtain useful sample at this point.  Continue empiric treatment CT A/P was not able to be performed with contrast but there are areas of hypoattenuation in the renal parenchyma that are suggestive of upper tract infection.    #retained ureteral stents # Bilateral percutaneous nephrostomy tubes  Bilateral percutaneous nephrostomy tubes placed in interventional radiology on 07/17/2023.  Retained ureteral stents were removed at this time.  Appreciate critical care and IR assistance.  Remove Foley catheter today.   No further urologic interventions to proceed with at this time.  We will continue to follow along peripherally.  Long-term plans to manage her stents from this point forward will be discussed on an outpatient basis if patient follows up.    Please call with questions.   Case and plan discussed with Dr. Berneice Heinrich, Dr. Corliss Blacker, and Dr. Sloan Leiter Intake/Output from previous day: 12/17 0701 - 12/18 0700 In: 2898.4 [I.V.:1241.9; NG/GT:636.7; IV Piggyback:999.9] Out: 1730.6 [Urine:140]  Intake/Output this shift: Total I/O In: 663.9 [I.V.:116.1; NG/GT:247.8; IV Piggyback:300] Out: 846 [Urine:50]  Physical Exam:  General: Intubated and sedated, remains in critical condition. CV: No cyanosis Lungs: equal chest rise Abdomen: Soft, NTND, no rebound or guarding Gu: Bilateral retained ureteral stents have been removed.  Bilateral percutaneous nephrostomy  tubes in place.  Bloody purulent urine  Lab Results: Recent Labs    07/17/23 0455 07/17/23 1016 07/18/23 0346  HGB 8.2* 8.8* 7.5*  HCT 25.0* 26.0* 23.6*   BMET Recent Labs    07/17/23 1418 07/18/23 0346  NA 134* 135  K 3.9 4.4  CL 102 101  CO2 21* 22  GLUCOSE 98 110*  BUN 67* 33*  CREATININE 4.93* 2.47*  CALCIUM 7.6* 8.0*     Studies/Results: IR NEPHROSTOMY PLACEMENT LEFT Result Date: 07/18/2023 INDICATION: 42 year old female with history of cervical cancer and bilateral nephroureteral stents placed in 2023 presenting with worsening renal function and sepsis, suspected urinary in origin. Request for bilateral nephrostomy tubes and removal of indwelling ureteral stents. EXAM: 1. ULTRASOUND GUIDANCE FOR PUNCTURE OF THE LEFT RENAL COLLECTING SYSTEM 2. LEFT PERCUTANEOUS NEPHROSTOMY TUBE PLACEMENT. 3. Fluoroscopic guided left ureteral stent retrieval. 4. ULTRASOUND GUIDANCE FOR PUNCTURE OF THE RIGHT RENAL COLLECTING SYSTEM 5. RIGHT PERCUTANEOUS NEPHROSTOMY TUBE PLACEMENT. 6. Fluoroscopic guided right ureteral stent retrieval. COMPARISON:  07/16/23 MEDICATIONS: The patient was currently receiving multiple intravenous antibiotics as an inpatient; The antibiotics were administered in an appropriate time frame prior to skin puncture. ANESTHESIA/SEDATION: The patient was intubated and sedated under the care of the Intensive Care Unit. CONTRAST:  25 mL Omnipaque 300 - administered into the renal collecting system FLUOROSCOPY TIME:  134 mGy COMPLICATIONS: None immediate. PROCEDURE: The procedure, risks, benefits, and alternatives were explained to the patient. Questions regarding the procedure were encouraged and answered. The patient understands and consents to the procedure. A timeout was performed prior to the initiation of the procedure. The left flank region was prepped and draped in  the usual sterile fashion and a sterile drape was applied covering the operative field. A sterile gown and  sterile gloves were used for the procedure. Local anesthesia was provided with 1% Lidocaine with epinephrine. Ultrasound was used to localize the left kidney. Under direct ultrasound guidance, a 20 gauge needle was advanced into the renal collecting system. An ultrasound image documentation was performed. Over a Nitrex wire, the tract was dilated with an Accustick stent. Next, under intermittent fluoroscpic guidance and over a short Amplatz wire, the track was dilated and a 7 Fr, 45 cm Tourguide sheath was inserted. Through the sheath, a 6 Fr, 3.5 cm loop snare was inserted, the proximal aspect of the ureteral stent was grasped and removed intact. The Amplatz wire was reinserted and the sheath was exchanged for a 10-French percutaneous nephrostomy catheter which was advanced to the level of the renal pelvis where the coil was formed and locked. Contrast was injected and confirmed location within the renal pelvis. The catheter was secured at the skin with a 0-silk retention suture and stat lock device and connected to a gravity bag was placed. Dressings were applied. The right flank region was prepped and draped in the usual sterile fashion and a sterile drape was applied covering the operative field. A sterile gown and sterile gloves were used for the procedure. Local anesthesia was provided with 1% Lidocaine with epinephrine. Ultrasound was used to localize the right kidney. Under direct ultrasound guidance, a 20 gauge needle was advanced into the renal collecting system. An ultrasound image documentation was performed. Over a Nitrex wire, the tract was dilated with an Accustick stent. Next, under intermittent fluoroscpic guidance and over a short Amplatz wire, the track was dilated and a 7 Fr, 45 cm Tourguide sheath was inserted. Through the sheath, a 6 Fr, 3.5 cm loop snare was inserted, the proximal aspect of the ureteral stent was grasped and removed intact. The Amplatz wire was reinserted and the sheath was  exchanged for a 10-French percutaneous nephrostomy catheter which was advanced to the level of the renal pelvis where the coil was formed and locked. Contrast was injected and confirmed location within the renal pelvis. The catheter was secured at the skin with a 0-silk retention suture and stat lock device and connected to a gravity bag was placed. Dressings were applied. The patient tolerated procedure well without immediate postprocedural complication. FINDINGS: Ultrasound scanning demonstrates a minimally dilated left collecting system and moderated dilated right collecting system with diffuse parenchymal edema. IMPRESSION: 1. Successful ultrasound and fluoroscopic guided left ureteral stent retrieval placement of a left 10 French percutaneous nephrostomy. 2. Successful ultrasound and fluoroscopic guided right ureteral stent retrieval placement of a right 10 French percutaneous nephrostomy. Marliss Coots, MD [facility] Radiology Electronically Signed   By: Marliss Coots M.D.   On: 07/18/2023 08:18   IR NEPHROSTOMY PLACEMENT RIGHT Result Date: 07/18/2023 INDICATION: 42 year old female with history of cervical cancer and bilateral nephroureteral stents placed in 2023 presenting with worsening renal function and sepsis, suspected urinary in origin. Request for bilateral nephrostomy tubes and removal of indwelling ureteral stents. EXAM: 1. ULTRASOUND GUIDANCE FOR PUNCTURE OF THE LEFT RENAL COLLECTING SYSTEM 2. LEFT PERCUTANEOUS NEPHROSTOMY TUBE PLACEMENT. 3. Fluoroscopic guided left ureteral stent retrieval. 4. ULTRASOUND GUIDANCE FOR PUNCTURE OF THE RIGHT RENAL COLLECTING SYSTEM 5. RIGHT PERCUTANEOUS NEPHROSTOMY TUBE PLACEMENT. 6. Fluoroscopic guided right ureteral stent retrieval. COMPARISON:  07/16/23 MEDICATIONS: The patient was currently receiving multiple intravenous antibiotics as an  inpatient; The antibiotics were administered in an appropriate time frame  prior to skin puncture. ANESTHESIA/SEDATION: The patient was intubated and sedated under the care of the Intensive Care Unit. CONTRAST:  25 mL Omnipaque 300 - administered into the renal collecting system FLUOROSCOPY TIME:  134 mGy COMPLICATIONS: None immediate. PROCEDURE: The procedure, risks, benefits, and alternatives were explained to the patient. Questions regarding the procedure were encouraged and answered. The patient understands and consents to the procedure. A timeout was performed prior to the initiation of the procedure. The left flank region was prepped and draped in the usual sterile fashion and a sterile drape was applied covering the operative field. A sterile gown and sterile gloves were used for the procedure. Local anesthesia was provided with 1% Lidocaine with epinephrine. Ultrasound was used to localize the left kidney. Under direct ultrasound guidance, a 20 gauge needle was advanced into the renal collecting system. An ultrasound image documentation was performed. Over a Nitrex wire, the tract was dilated with an Accustick stent. Next, under intermittent fluoroscpic guidance and over a short Amplatz wire, the track was dilated and a 7 Fr, 45 cm Tourguide sheath was inserted. Through the sheath, a 6 Fr, 3.5 cm loop snare was inserted, the proximal aspect of the ureteral stent was grasped and removed intact. The Amplatz wire was reinserted and the sheath was exchanged for a 10-French percutaneous nephrostomy catheter which was advanced to the level of the renal pelvis where the coil was formed and locked. Contrast was injected and confirmed location within the renal pelvis. The catheter was secured at the skin with a 0-silk retention suture and stat lock device and connected to a gravity bag was placed. Dressings were applied. The right flank region was prepped and draped in the usual sterile fashion and a sterile drape was applied covering the operative field. A sterile gown and sterile gloves  were used for the procedure. Local anesthesia was provided with 1% Lidocaine with epinephrine. Ultrasound was used to localize the right kidney. Under direct ultrasound guidance, a 20 gauge needle was advanced into the renal collecting system. An ultrasound image documentation was performed. Over a Nitrex wire, the tract was dilated with an Accustick stent. Next, under intermittent fluoroscpic guidance and over a short Amplatz wire, the track was dilated and a 7 Fr, 45 cm Tourguide sheath was inserted. Through the sheath, a 6 Fr, 3.5 cm loop snare was inserted, the proximal aspect of the ureteral stent was grasped and removed intact. The Amplatz wire was reinserted and the sheath was exchanged for a 10-French percutaneous nephrostomy catheter which was advanced to the level of the renal pelvis where the coil was formed and locked. Contrast was injected and confirmed location within the renal pelvis. The catheter was secured at the skin with a 0-silk retention suture and stat lock device and connected to a gravity bag was placed. Dressings were applied. The patient tolerated procedure well without immediate postprocedural complication. FINDINGS: Ultrasound scanning demonstrates a minimally dilated left collecting system and moderated dilated right collecting system with diffuse parenchymal edema. IMPRESSION: 1. Successful ultrasound and fluoroscopic guided left ureteral stent retrieval placement of a left 10 French percutaneous nephrostomy. 2. Successful ultrasound and fluoroscopic guided right ureteral stent retrieval placement of a right 10 French percutaneous nephrostomy. Marliss Coots, MD [facility] Radiology Electronically Signed   By: Marliss Coots M.D.   On: 07/18/2023 08:18   IR URETERAL STENT PERC REMOVAL MOD SED Result Date: 07/18/2023 INDICATION:  42 year old female with history of cervical cancer and bilateral nephroureteral stents placed in 2023  presenting with worsening renal function and sepsis, suspected urinary in origin. Request for bilateral nephrostomy tubes and removal of indwelling ureteral stents. EXAM: 1. ULTRASOUND GUIDANCE FOR PUNCTURE OF THE LEFT RENAL COLLECTING SYSTEM 2. LEFT PERCUTANEOUS NEPHROSTOMY TUBE PLACEMENT. 3. Fluoroscopic guided left ureteral stent retrieval. 4. ULTRASOUND GUIDANCE FOR PUNCTURE OF THE RIGHT RENAL COLLECTING SYSTEM 5. RIGHT PERCUTANEOUS NEPHROSTOMY TUBE PLACEMENT. 6. Fluoroscopic guided right ureteral stent retrieval. COMPARISON:  07/16/23 MEDICATIONS: The patient was currently receiving multiple intravenous antibiotics as an inpatient; The antibiotics were administered in an appropriate time frame prior to skin puncture. ANESTHESIA/SEDATION: The patient was intubated and sedated under the care of the Intensive Care Unit. CONTRAST:  25 mL Omnipaque 300 - administered into the renal collecting system FLUOROSCOPY TIME:  134 mGy COMPLICATIONS: None immediate. PROCEDURE: The procedure, risks, benefits, and alternatives were explained to the patient. Questions regarding the procedure were encouraged and answered. The patient understands and consents to the procedure. A timeout was performed prior to the initiation of the procedure. The left flank region was prepped and draped in the usual sterile fashion and a sterile drape was applied covering the operative field. A sterile gown and sterile gloves were used for the procedure. Local anesthesia was provided with 1% Lidocaine with epinephrine. Ultrasound was used to localize the left kidney. Under direct ultrasound guidance, a 20 gauge needle was advanced into the renal collecting system. An ultrasound image documentation was performed. Over a Nitrex wire, the tract was dilated with an Accustick stent. Next, under intermittent fluoroscpic guidance and over a short Amplatz wire, the track was dilated and a 7 Fr, 45 cm Tourguide sheath was inserted. Through the sheath, a 6  Fr, 3.5 cm loop snare was inserted, the proximal aspect of the ureteral stent was grasped and removed intact. The Amplatz wire was reinserted and the sheath was exchanged for a 10-French percutaneous nephrostomy catheter which was advanced to the level of the renal pelvis where the coil was formed and locked. Contrast was injected and confirmed location within the renal pelvis. The catheter was secured at the skin with a 0-silk retention suture and stat lock device and connected to a gravity bag was placed. Dressings were applied. The right flank region was prepped and draped in the usual sterile fashion and a sterile drape was applied covering the operative field. A sterile gown and sterile gloves were used for the procedure. Local anesthesia was provided with 1% Lidocaine with epinephrine. Ultrasound was used to localize the right kidney. Under direct ultrasound guidance, a 20 gauge needle was advanced into the renal collecting system. An ultrasound image documentation was performed. Over a Nitrex wire, the tract was dilated with an Accustick stent. Next, under intermittent fluoroscpic guidance and over a short Amplatz wire, the track was dilated and a 7 Fr, 45 cm Tourguide sheath was inserted. Through the sheath, a 6 Fr, 3.5 cm loop snare was inserted, the proximal aspect of the ureteral stent was grasped and removed intact. The Amplatz wire was reinserted and the sheath was exchanged for a 10-French percutaneous nephrostomy catheter which was advanced to the level of the renal pelvis where the coil was formed and locked. Contrast was injected and confirmed location within the renal pelvis. The catheter was secured at the skin with a 0-silk retention suture and stat lock device and connected to a gravity bag was placed. Dressings were applied. The patient  tolerated procedure well without immediate postprocedural complication. FINDINGS: Ultrasound scanning demonstrates a minimally dilated left collecting system  and moderated dilated right collecting system with diffuse parenchymal edema. IMPRESSION: 1. Successful ultrasound and fluoroscopic guided left ureteral stent retrieval placement of a left 10 French percutaneous nephrostomy. 2. Successful ultrasound and fluoroscopic guided right ureteral stent retrieval placement of a right 10 French percutaneous nephrostomy. Marliss Coots, MD [facility] Radiology Electronically Signed   By: Marliss Coots M.D.   On: 07/18/2023 08:18   IR URETERAL STENT PERC REMOVAL MOD SED Result Date: 07/18/2023 INDICATION: 42 year old female with history of cervical cancer and bilateral nephroureteral stents placed in 2023 presenting with worsening renal function and sepsis, suspected urinary in origin. Request for bilateral nephrostomy tubes and removal of indwelling ureteral stents. EXAM: 1. ULTRASOUND GUIDANCE FOR PUNCTURE OF THE LEFT RENAL COLLECTING SYSTEM 2. LEFT PERCUTANEOUS NEPHROSTOMY TUBE PLACEMENT. 3. Fluoroscopic guided left ureteral stent retrieval. 4. ULTRASOUND GUIDANCE FOR PUNCTURE OF THE RIGHT RENAL COLLECTING SYSTEM 5. RIGHT PERCUTANEOUS NEPHROSTOMY TUBE PLACEMENT. 6. Fluoroscopic guided right ureteral stent retrieval. COMPARISON:  07/16/23 MEDICATIONS: The patient was currently receiving multiple intravenous antibiotics as an inpatient; The antibiotics were administered in an appropriate time frame prior to skin puncture. ANESTHESIA/SEDATION: The patient was intubated and sedated under the care of the Intensive Care Unit. CONTRAST:  25 mL Omnipaque 300 - administered into the renal collecting system FLUOROSCOPY TIME:  134 mGy COMPLICATIONS: None immediate. PROCEDURE: The procedure, risks, benefits, and alternatives were explained to the patient. Questions regarding the procedure were encouraged and answered. The patient understands and consents to the procedure. A timeout was performed prior to the initiation of the procedure.  The left flank region was prepped and draped in the usual sterile fashion and a sterile drape was applied covering the operative field. A sterile gown and sterile gloves were used for the procedure. Local anesthesia was provided with 1% Lidocaine with epinephrine. Ultrasound was used to localize the left kidney. Under direct ultrasound guidance, a 20 gauge needle was advanced into the renal collecting system. An ultrasound image documentation was performed. Over a Nitrex wire, the tract was dilated with an Accustick stent. Next, under intermittent fluoroscpic guidance and over a short Amplatz wire, the track was dilated and a 7 Fr, 45 cm Tourguide sheath was inserted. Through the sheath, a 6 Fr, 3.5 cm loop snare was inserted, the proximal aspect of the ureteral stent was grasped and removed intact. The Amplatz wire was reinserted and the sheath was exchanged for a 10-French percutaneous nephrostomy catheter which was advanced to the level of the renal pelvis where the coil was formed and locked. Contrast was injected and confirmed location within the renal pelvis. The catheter was secured at the skin with a 0-silk retention suture and stat lock device and connected to a gravity bag was placed. Dressings were applied. The right flank region was prepped and draped in the usual sterile fashion and a sterile drape was applied covering the operative field. A sterile gown and sterile gloves were used for the procedure. Local anesthesia was provided with 1% Lidocaine with epinephrine. Ultrasound was used to localize the right kidney. Under direct ultrasound guidance, a 20 gauge needle was advanced into the renal collecting system. An ultrasound image documentation was performed. Over a Nitrex wire, the tract was dilated with an Accustick stent. Next, under intermittent fluoroscpic guidance and over a short Amplatz wire, the track was dilated and a 7 Fr, 45  cm Tourguide sheath was inserted. Through the sheath, a 6 Fr, 3.5  cm loop snare was inserted, the proximal aspect of the ureteral stent was grasped and removed intact. The Amplatz wire was reinserted and the sheath was exchanged for a 10-French percutaneous nephrostomy catheter which was advanced to the level of the renal pelvis where the coil was formed and locked. Contrast was injected and confirmed location within the renal pelvis. The catheter was secured at the skin with a 0-silk retention suture and stat lock device and connected to a gravity bag was placed. Dressings were applied. The patient tolerated procedure well without immediate postprocedural complication. FINDINGS: Ultrasound scanning demonstrates a minimally dilated left collecting system and moderated dilated right collecting system with diffuse parenchymal edema. IMPRESSION: 1. Successful ultrasound and fluoroscopic guided left ureteral stent retrieval placement of a left 10 French percutaneous nephrostomy. 2. Successful ultrasound and fluoroscopic guided right ureteral stent retrieval placement of a right 10 French percutaneous nephrostomy. Marliss Coots, MD [facility] Radiology Electronically Signed   By: Marliss Coots M.D.   On: 07/18/2023 08:18   DG Chest Port 1 View Result Date: 07/17/2023 CLINICAL DATA:  Evaluate ET tube placement EXAM: PORTABLE CHEST 1 VIEW COMPARISON:  07/16/2023 FINDINGS: The endotracheal tube tip is above the carina. There is a right IJ catheter with tip in the superior cavoatrial junction. Enteric tube tip and side port are below the GE junction. Stable cardiomediastinal contours. No pleural fluid, interstitial edema or airspace disease. IMPRESSION: 1. Satisfactory position of endotracheal tube. 2. No acute cardiopulmonary abnormalities. Electronically Signed   By: Signa Kell M.D.   On: 07/17/2023 07:40   DG CHEST PORT 1 VIEW Result Date: 07/17/2023 CLINICAL DATA:  Central line EXAM: PORTABLE CHEST 1 VIEW COMPARISON:  Chest  x-ray 07/16/2023 FINDINGS: Endotracheal tube tip is 2.4 cm above the carina. New right-sided central venous catheter tip ends in the SVC. Enteric tube extends below the diaphragm. The heart size and mediastinal contours are within normal limits. Both lungs are clear. There is no pleural effusion or pneumothorax. The visualized skeletal structures are unremarkable. IMPRESSION: New right-sided central venous catheter tip ends in the SVC. No pneumothorax. Electronically Signed   By: Darliss Cheney M.D.   On: 07/17/2023 01:11   CT CHEST ABDOMEN PELVIS WO CONTRAST Result Date: 07/16/2023 CLINICAL DATA:  Sepsis, delirium EXAM: CT CHEST, ABDOMEN AND PELVIS WITHOUT CONTRAST TECHNIQUE: Multidetector CT imaging of the chest, abdomen and pelvis was performed following the standard protocol without IV contrast. RADIATION DOSE REDUCTION: This exam was performed according to the departmental dose-optimization program which includes automated exposure control, adjustment of the mA and/or kV according to patient size and/or use of iterative reconstruction technique. COMPARISON:  Chest radiograph 07/16/2023; CT abdomen and pelvis 08/05/2021 and CT chest 08/18/2020 FINDINGS: CT CHEST FINDINGS Cardiovascular: Normal heart size.  No pericardial effusion. Mediastinum/Nodes: Endotracheal tube tip in the intrathoracic trachea. Enteric tube tip in the stomach. Small amount of pneumomediastinum. No definite thoracic adenopathy Lungs/Pleura: Bilateral centrilobular micro nodules and tree-in-bud opacities greatest in the bilateral lower lobes compatible with small airway infection/inflammation and/or aspiration. No pleural effusion or pneumothorax. Musculoskeletal: No acute fracture. CT ABDOMEN PELVIS FINDINGS Hepatobiliary: Unremarkable noncontrast appearance of the liver. Cholecystectomy. No biliary dilation. Pancreas: Unremarkable. Spleen: Unremarkable. Adrenals/Urinary Tract: Stable adrenal glands. Bilateral ureteral stents. Bilateral  hydronephrosis is similar to 08/05/2021. Thick-walled nondistended urinary bladder about a Foley catheter. Locules of gas in the bladder. Stomach/Bowel: No bowel obstruction or bowel  wall thickening. The appendix is normal. Stomach is within normal limits. Vascular/Lymphatic: Similar prominent retroperitoneal lymph nodes. For example, 1.0 cm left para-aortic lymph node. Bilateral inguinal adenopathy measuring 1.7 cm on the left and 1.8 cm on the right is similar to decreased compared to 08/05/2021. No new or enlarging thoracic adenopathy. Unremarkable noncontrast appearance of the vasculature. Reproductive: Unremarkable. Other: Small volume free fluid in the pelvis. No free intraperitoneal air. Musculoskeletal: No acute fracture. IMPRESSION: 1. Bilateral centrilobular micro nodules and tree-in-bud opacities greatest in the bilateral lower lobes compatible with small airway infection/inflammation and/or aspiration. 2. Small amount of pneumomediastinum. 3. Thick-walled nondistended urinary bladder about a Foley catheter. Correlate with urinalysis to exclude cystitis. 4. Bilateral ureteral stents. Bilateral hydronephrosis is similar to 08/05/2021. 5. Similar prominent retroperitoneal lymph nodes and decreased inguinal lymph nodes. Electronically Signed   By: Minerva Fester M.D.   On: 07/16/2023 22:13   CT Head Wo Contrast Result Date: 07/16/2023 CLINICAL DATA:  Delirium and sepsis EXAM: CT HEAD WITHOUT CONTRAST TECHNIQUE: Contiguous axial images were obtained from the base of the skull through the vertex without intravenous contrast. RADIATION DOSE REDUCTION: This exam was performed according to the departmental dose-optimization program which includes automated exposure control, adjustment of the mA and/or kV according to patient size and/or use of iterative reconstruction technique. COMPARISON:  None Available. FINDINGS: Brain: There is no mass, hemorrhage or extra-axial collection. Normal appearance of the  white matter with preserved gray-white differentiation. Normal CSF spaces. Vascular: Negative Skull: Negative Sinuses/Orbits: Moderate paranasal sinus disease. Other: Endotracheal intubation IMPRESSION: 1. No acute intracranial abnormality. 2. Diffuse, moderately severe paranasal sinus disease. Electronically Signed   By: Deatra Robinson M.D.   On: 07/16/2023 21:13   DG Chest Portable 1 View Result Date: 07/16/2023 CLINICAL DATA:  Shortness of breath: ETT placement EXAM: PORTABLE CHEST 1 VIEW COMPARISON:  Same day chest radiograph FINDINGS: Stable cardiomediastinal silhouette. Aortic atherosclerotic calcification. No focal consolidation, pleural effusion, or pneumothorax. No displaced rib fractures. Subdiaphragmatic enteric tube with side port in the stomach. Endotracheal tube tip in the mid intrathoracic trachea proximally cm from the carina. IMPRESSION: Endotracheal tube tip in the mid intrathoracic trachea. Subdiaphragmatic enteric tube. Otherwise no change from radiographs earlier today. Electronically Signed   By: Minerva Fester M.D.   On: 07/16/2023 20:57   DG Chest 1 View Result Date: 07/16/2023 CLINICAL DATA:  Reason for exam: Patient order states screening. Best obtainable images due to patient cooperation and condition. EXAM: CHEST  1 VIEW COMPARISON:  11/24/2007 radiographic and 08/18/2020 CT chest FINDINGS: Normal cardiomediastinal silhouette. No focal consolidation, pleural effusion, or pneumothorax. No displaced rib fractures. IMPRESSION: No acute cardiopulmonary disease. Electronically Signed   By: Minerva Fester M.D.   On: 07/16/2023 20:54      LOS: 2 days   Elmon Kirschner, NP [facility] Pager: [phone]  07/18/2023, 12:42 PM

## 2023-07-19 ENCOUNTER — Other Ambulatory Visit (HOSPITAL_COMMUNITY): Payer: Commercial Managed Care - HMO

## 2023-07-19 ENCOUNTER — Inpatient Hospital Stay (HOSPITAL_COMMUNITY): Payer: Commercial Managed Care - HMO

## 2023-07-19 DIAGNOSIS — N390 Urinary tract infection, site not specified: Secondary | ICD-10-CM | POA: Diagnosis not present

## 2023-07-19 DIAGNOSIS — E43 Unspecified severe protein-calorie malnutrition: Secondary | ICD-10-CM | POA: Insufficient documentation

## 2023-07-19 DIAGNOSIS — A419 Sepsis, unspecified organism: Secondary | ICD-10-CM | POA: Diagnosis not present

## 2023-07-19 DIAGNOSIS — A400 Sepsis due to streptococcus, group A: Secondary | ICD-10-CM

## 2023-07-19 DIAGNOSIS — R6521 Severe sepsis with septic shock: Secondary | ICD-10-CM | POA: Diagnosis not present

## 2023-07-19 DIAGNOSIS — J9691 Respiratory failure, unspecified with hypoxia: Secondary | ICD-10-CM | POA: Diagnosis not present

## 2023-07-19 LAB — CBC
HCT: 23.1 % — ABNORMAL LOW (ref 36.0–46.0)
Hemoglobin: 7 g/dL — ABNORMAL LOW (ref 12.0–15.0)
MCH: 29.2 pg (ref 26.0–34.0)
MCHC: 30.3 g/dL (ref 30.0–36.0)
MCV: 96.3 fL (ref 80.0–100.0)
Platelets: 272 10*3/uL (ref 150–400)
RBC: 2.4 MIL/uL — ABNORMAL LOW (ref 3.87–5.11)
RDW: 14.8 % (ref 11.5–15.5)
WBC: 47.3 10*3/uL — ABNORMAL HIGH (ref 4.0–10.5)
nRBC: 0.3 % — ABNORMAL HIGH (ref 0.0–0.2)

## 2023-07-19 LAB — CBC WITH DIFFERENTIAL/PLATELET
Abs Immature Granulocytes: 0 10*3/uL (ref 0.00–0.07)
Basophils Absolute: 0 10*3/uL (ref 0.0–0.1)
Basophils Relative: 0 %
Eosinophils Absolute: 1.5 10*3/uL — ABNORMAL HIGH (ref 0.0–0.5)
Eosinophils Relative: 3 %
HCT: 20.9 % — ABNORMAL LOW (ref 36.0–46.0)
Hemoglobin: 6.4 g/dL — CL (ref 12.0–15.0)
Lymphocytes Relative: 4 %
Lymphs Abs: 2 10*3/uL (ref 0.7–4.0)
MCH: 29.6 pg (ref 26.0–34.0)
MCHC: 30.6 g/dL (ref 30.0–36.0)
MCV: 96.8 fL (ref 80.0–100.0)
Monocytes Absolute: 1 10*3/uL (ref 0.1–1.0)
Monocytes Relative: 2 %
Neutro Abs: 45.5 10*3/uL — ABNORMAL HIGH (ref 1.7–7.7)
Neutrophils Relative %: 91 %
Platelets: 229 10*3/uL (ref 150–400)
RBC: 2.16 MIL/uL — ABNORMAL LOW (ref 3.87–5.11)
RDW: 15 % (ref 11.5–15.5)
WBC: 50 10*3/uL — ABNORMAL HIGH (ref 4.0–10.5)
nRBC: 0 /100{WBCs}
nRBC: 0.3 % — ABNORMAL HIGH (ref 0.0–0.2)

## 2023-07-19 LAB — GLUCOSE, CAPILLARY
Glucose-Capillary: 113 mg/dL — ABNORMAL HIGH (ref 70–99)
Glucose-Capillary: 132 mg/dL — ABNORMAL HIGH (ref 70–99)
Glucose-Capillary: 132 mg/dL — ABNORMAL HIGH (ref 70–99)
Glucose-Capillary: 125 mg/dL — ABNORMAL HIGH (ref 70–99)
Glucose-Capillary: 108 mg/dL — ABNORMAL HIGH (ref 70–99)
Glucose-Capillary: 147 mg/dL — ABNORMAL HIGH (ref 70–99)

## 2023-07-19 LAB — APTT: aPTT: 44 s — ABNORMAL HIGH (ref 24–36)

## 2023-07-19 LAB — HEPATIC FUNCTION PANEL
ALT: 61 U/L — ABNORMAL HIGH (ref 0–44)
AST: 131 U/L — ABNORMAL HIGH (ref 15–41)
Albumin: 1.8 g/dL — ABNORMAL LOW (ref 3.5–5.0)
Alkaline Phosphatase: 122 U/L (ref 38–126)
Bilirubin, Direct: 0.3 mg/dL — ABNORMAL HIGH (ref 0.0–0.2)
Indirect Bilirubin: 0.4 mg/dL (ref 0.3–0.9)
Total Bilirubin: 0.7 mg/dL (ref <1.2)
Total Protein: 8.1 g/dL (ref 6.5–8.1)

## 2023-07-19 LAB — ACID FAST SMEAR (AFB, MYCOBACTERIA): Acid Fast Smear: NEGATIVE

## 2023-07-19 LAB — HEMOGLOBIN AND HEMATOCRIT, BLOOD
HCT: 26.7 % — ABNORMAL LOW (ref 36.0–46.0)
Hemoglobin: 8.2 g/dL — ABNORMAL LOW (ref 12.0–15.0)

## 2023-07-19 LAB — CULTURE, BLOOD (ROUTINE X 2): Special Requests: ADEQUATE

## 2023-07-19 LAB — RENAL FUNCTION PANEL
Albumin: 1.7 g/dL — ABNORMAL LOW (ref 3.5–5.0)
Albumin: 1.7 g/dL — ABNORMAL LOW (ref 3.5–5.0)
Anion gap: 6 (ref 5–15)
Anion gap: 8 (ref 5–15)
BUN: 15 mg/dL (ref 6–20)
BUN: 14 mg/dL (ref 6–20)
CO2: 25 mmol/L (ref 22–32)
CO2: 25 mmol/L (ref 22–32)
Calcium: 7.8 mg/dL — ABNORMAL LOW (ref 8.9–10.3)
Calcium: 7.9 mg/dL — ABNORMAL LOW (ref 8.9–10.3)
Chloride: 102 mmol/L (ref 98–111)
Chloride: 99 mmol/L (ref 98–111)
Creatinine, Ser: 1.7 mg/dL — ABNORMAL HIGH (ref 0.44–1.00)
Creatinine, Ser: 1.41 mg/dL — ABNORMAL HIGH (ref 0.44–1.00)
GFR, Estimated: 38 mL/min — ABNORMAL LOW (ref >60)
GFR, Estimated: 47 mL/min — ABNORMAL LOW (ref >60)
Glucose, Bld: 121 mg/dL — ABNORMAL HIGH (ref 70–99)
Glucose, Bld: 145 mg/dL — ABNORMAL HIGH (ref 70–99)
Phosphorus: 1.8 mg/dL — ABNORMAL LOW (ref 2.5–4.6)
Phosphorus: 3 mg/dL (ref 2.5–4.6)
Potassium: 4.1 mmol/L (ref 3.5–5.1)
Potassium: 4.5 mmol/L (ref 3.5–5.1)
Sodium: 133 mmol/L — ABNORMAL LOW (ref 135–145)
Sodium: 132 mmol/L — ABNORMAL LOW (ref 135–145)

## 2023-07-19 LAB — MAGNESIUM: Magnesium: 2.3 mg/dL (ref 1.7–2.4)

## 2023-07-19 LAB — CULTURE, RESPIRATORY W GRAM STAIN: Special Requests: NORMAL

## 2023-07-19 LAB — PREPARE RBC (CROSSMATCH)

## 2023-07-19 MED ORDER — SODIUM CHLORIDE 0.9% IV SOLUTION: Freq: Once | INTRAVENOUS | Status: DC

## 2023-07-19 MED ORDER — PENICILLIN G POTASSIUM 20000000 UNITS IJ SOLR
4.0000 10*6.[IU] | Freq: Four times a day (QID) | INTRAVENOUS | Status: DC
  Filled 2023-07-19: qty 4

## 2023-07-19 MED ORDER — PENICILLIN G POT IN DEXTROSE 60000 UNIT/ML IV SOLN
3.0000 10*6.[IU] | INTRAVENOUS | Status: DC
  Administered 2023-07-19 – 2023-07-21 (×10): 3 10*6.[IU] via INTRAVENOUS
  Filled 2023-07-19 (×12): qty 50

## 2023-07-19 MED ORDER — POTASSIUM PHOSPHATES 15 MMOLE/5ML IV SOLN
30.0000 mmol | Freq: Once | INTRAVENOUS | Status: AC
  Administered 2023-07-19: 30 mmol via INTRAVENOUS
  Filled 2023-07-19: qty 10

## 2023-07-19 NOTE — Consult Note (Signed)
WOC Nurse Consult Note: WOC consult requested for bilat legs.  Consult was performed on 12/17; refer to previous progress notes for assessment and plan of care, but somehow the wound care orders have been deleted.  Provided topical treatment orders for bedside nurses to perform as follows: Change dressings to bilat legs Q day as follows: Apply barrier cream on the legs. Apply on the wound bed Xeroform, cover with gauze, wrap with Kerlix beginning on the bottom of the toes, until the knees, not too tight. Please re-consult if further assistance is needed.  Thank-you,  Cammie Mcgee MSN, RN, CWOCN, [place], CNS [phone]
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
## 2023-07-19 NOTE — Progress Notes (Signed)
[facility]  Date of Admission:  07/16/2023     Lines:  12/17-c bilateral nephrostomy tubes 12/16-c right internal jugular hd catheter     Abx: 12/17-c linezolid 12/19-c penicillin g  12/16-19 cefepime 12/17-12/18 metronidazole 12/16 vanc                                                           Assessment: 42 yo Brewer with hx cervical cancer s/p chemoxrt and brachytherapy (unclear status of disease), presence of bilateral ureteral stent for distal ureteral stricture/obstructive uropathy (stents in for a year), ckd stage 4, admitted with acute ams, abd pain, hematuria, found to have:   Septic shock -- bp 42/16 on presentation cellulitis Group a strep bacteremia Hypoxic respiratory failure -- intubated on presentation in setting septic shock Ct imaging distended bladder and tree in bud bilateral basal lungs  AKI     12/16 mrsa nares pcr positive 12/17 ucx in progress 12/17 respiratory culture (BAL) in progress 12/16 bcx 1 of 2 set with group a strep by bcid   Source for group a strep might be lower ext open wound/cellulitis. No sign of nec fasc now but would need to closely monitor   Doesn't appear to have toxic shock syndrome at this time     She has chronic intermittent dysuria in setting of cervical cancer/xrt and bilateral ureteral stricture. No clinical change per history. Ct showed stable hydronephrosis   Bilateral basilar chest ct finding tree in bud ?aspiration. S/p BAL sampling     At this time covering broadly with abx    ------------ 12/18 id assessment Off pressors this morning Repeat bcx 12/17 CoNS likely contaminant 12/17 BAL cx ngtd 12/17 s/p ureteral stent removal bilaterally and placement perc-nephrostomy tubes 12/17 respiratory viral pcr rhino virus  12/17 ucx multiple species    Bilateral LE open wounds/scabs and well marked erythema without fluctuance/blister.   Suspect source of infection/sepsis  mainly driven by group a strep process with rhinovirus minorly contribute and explaining tree in bud changes on chest imaging   Urine process chronic and doesn't appear to contribute  Off crrt for the latter part of today   12/19 id assessment Crrt restarted and there was concern for membranes being clotted off with failed blood return --> hb 6's Remains off levophed Rash in legs stable no sign of nec fasc  1 episode of diarrhea today 12/19 am. Wbc up to 50 but maybe reactive due to noninfectious cause. Will monitor Abdomen soft Chest xray for the most part clear with nonspecific bibasilar opacity  Bal cx from 12/17 rare mrsa   Repeat bcx 12/16 showed CoNS contaminant  Plan:  Agree with pulm/ccm regarding tte in setting hemodynamis instability. Low burden strep A bacteremia is unlikely endocarditis agent Will narrow abx to linezolid/penicillin g, this would reduce risk of cdiff from bsAbx Other supportive care per pulm ccm If increasing diarrhea would consider sending cdiff testing especially if fever, increasing wbc without other explanation Discussed with primary team     Principal Problem:   Sepsis due to urinary tract infection (HCC) Active Problems:   Acute respiratory failure (HCC)   Altered mental status   Allergies  Allergen Reactions   Imitrex [Sumatriptan Base] Palpitations  Heart races   Cephalexin Rash    Patient passed amoxicillin challenge on 08/18/20, no adverse effect    Scheduled Meds:  sodium chloride   Intravenous Once   Chlorhexidine Gluconate Cloth  6 each Topical Daily   Chlorhexidine Gluconate Cloth  6 each Topical Q0600   docusate  100 mg Per Tube BID   etomidate  0.3 mg/kg Intravenous Once   feeding supplement (PROSource TF20)  60 mL Per Tube BID   heparin  5,000 Units Subcutaneous Q8H   insulin aspart  0-9 Units Subcutaneous Q4H   multivitamin  1 tablet Per Tube QHS   mupirocin ointment  1 Application Nasal BID   mouth rinse  15 mL  Mouth Rinse Q2H   pantoprazole (PROTONIX) IV  40 mg Intravenous QHS   polyethylene glycol  17 g Per Tube Daily   sodium chloride flush  5 mL Intracatheter Q8H   Continuous Infusions:   prismasol BGK 4/2.5 800 mL/hr at 07/19/23 0521    prismasol BGK 4/2.5 400 mL/hr at 07/19/23 0523   ceFEPime (MAXIPIME) IV Stopped (07/19/23 0534)   feeding supplement (VITAL 1.5 CAL) 50 mL/hr at 07/19/23 0902   fentaNYL infusion INTRAVENOUS 200 mcg/hr (07/19/23 0900)   heparin 10,000 units/ 20 mL infusion syringe 750 Units/hr (07/19/23 0920)   linezolid (ZYVOX) IV Stopped (07/18/23 2242)   norepinephrine (LEVOPHED) Adult infusion Stopped (07/19/23 0518)   potassium PHOSPHATE IVPB (in mmol) 85 mL/hr at 07/19/23 0900   prismasol BGK 4/2.5 2,000 mL/hr at 07/19/23 0837   propofol (DIPRIVAN) infusion 15 mcg/kg/min (07/19/23 0900)   PRN Meds:.fentaNYL, heparin, heparin, midazolam, mouth rinse   SUBJECTIVE: Remains off pressure Back on crrt Blood clotted on crrt membrane and hb 6's Wbc 50 Afebrile Cxr "bibasilar opacity" 1 episode loose stool Legs wound stable    Review of Systems: ROS All other ROS was negative, except mentioned above     OBJECTIVE: Vitals:   07/19/23 0909 07/19/23 0915 07/19/23 0930 07/19/23 0945  BP: (!) 90/57 101/82 (!) 95/54 (!) 88/59  Pulse: (!) 117 (!) 115 (!) 112 (!) 113  Resp: (!) 27 (!) 29 (!) 27 (!) 31  Temp: 98.2 F (36.8 C)     TempSrc: Oral     SpO2: 95% 96% 96% 96%  Weight:      Height:       Body mass index is 24.16 kg/m.  Physical Exam General/constitutional: ill appearing, intubated, sedated/comatose HEENT: Normocephalic, PER, Conj Clear CV: rrr no mrg Lungs: clear on ven -- minimal vent setting 40% fio2 Abd: Soft, Nontender Skin/Ext: brawny bilateral lower edema with well demarcated bilateral erythema and circular open wound/scab Neuro: sedated/intubated MSK: no peripheral joint swelling/warmth  Gu bilateral nephrostomy tube draining  urine  Central line presence: right internal jugular hd cath site no redness/purulence   Lab Results Lab Results  Component Value Date   WBC 50.0 (H) 07/19/2023   HGB 6.4 (LL) 07/19/2023   HCT 20.9 (L) 07/19/2023   MCV 96.8 07/19/2023   PLT 229 07/19/2023    Lab Results  Component Value Date   CREATININE 1.70 (H) 07/19/2023   BUN 15 07/19/2023   NA 133 (L) 07/19/2023   K 4.1 07/19/2023   CL 102 07/19/2023   CO2 25 07/19/2023    Lab Results  Component Value Date   ALT 15 07/16/2023   AST 24 07/16/2023   ALKPHOS 127 (H) 07/16/2023   BILITOT 0.8 07/16/2023      Microbiology: Recent Results (from the  past 240 hours)  Resp panel by RT-PCR (RSV, Flu A&B, Covid) Anterior Nasal Swab     Status: None   Collection Time: 07/16/23  4:49 PM   Specimen: Anterior Nasal Swab  Result Value Ref Range Status   SARS Coronavirus 2 by RT PCR NEGATIVE NEGATIVE Final   Influenza A by PCR NEGATIVE NEGATIVE Final   Influenza B by PCR NEGATIVE NEGATIVE Final    Comment: (NOTE) The Xpert Xpress SARS-CoV-2/FLU/RSV plus assay is intended as an aid in the diagnosis of influenza from Nasopharyngeal swab specimens and should not be used as a sole basis for treatment. Nasal washings and aspirates are unacceptable for Xpert Xpress SARS-CoV-2/FLU/RSV testing.  Fact Sheet for Patients: BloggerCourse.com  Fact Sheet for Healthcare Providers: SeriousBroker.it  This test is not yet approved or cleared by the [place] FDA and has been authorized for detection and/or diagnosis of SARS-CoV-2 by FDA under an Emergency Use Authorization (EUA). This EUA will remain in effect (meaning this test can be used) for the duration of the COVID-19 declaration under Section 564(b)(1) of the Act, 21 U.S.C. section 360bbb-3(b)(1), unless the authorization is terminated or revoked.     Resp Syncytial Virus by PCR NEGATIVE NEGATIVE Final    Comment:  (NOTE) Fact Sheet for Patients: BloggerCourse.com  Fact Sheet for Healthcare Providers: SeriousBroker.it  This test is not yet approved or cleared by the [place] FDA and has been authorized for detection and/or diagnosis of SARS-CoV-2 by FDA under an Emergency Use Authorization (EUA). This EUA will remain in effect (meaning this test can be used) for the duration of the COVID-19 declaration under Section 564(b)(1) of the Act, 21 U.S.C. section 360bbb-3(b)(1), unless the authorization is terminated or revoked.  Performed at [facility], [address] [place], [place] [address]   Blood culture (routine x 2)     Status: Abnormal   Collection Time: 07/16/23  4:50 PM   Specimen: BLOOD  Result Value Ref Range Status   Specimen Description BLOOD SITE NOT SPECIFIED  Final   Special Requests   Final    BOTTLES DRAWN AEROBIC ONLY Blood Culture adequate volume   Culture  Setup Time   Final    GRAM POSITIVE COCCI IN CHAINS ANAEROBIC BOTTLE ONLY CRITICAL RESULT CALLED TO, READ BACK BY AND VERIFIED WITH: PHARMD ELIZABETH M. [id number] FCP GRAM POSITIVE COCCI IN CLUSTERS BOTTLES DRAWN AEROBIC ONLY CRITICAL RESULT CALLED TO, READ BACK BY AND VERIFIED WITH: PHARMD JESSICA MILLEN ON 07/17/23 @ 1723 BY DRT    Culture (A)  Final    STREPTOCOCCUS PYOGENES STAPHYLOCOCCUS EPIDERMIDIS THE SIGNIFICANCE OF ISOLATING THIS ORGANISM FROM A SINGLE SET OF BLOOD CULTURES WHEN MULTIPLE SETS ARE DRAWN IS UNCERTAIN. PLEASE NOTIFY THE MICROBIOLOGY DEPARTMENT WITHIN ONE WEEK IF SPECIATION AND SENSITIVITIES ARE REQUIRED. HEALTH DEPARTMENT NOTIFIED Performed at [facility], [address] [place], [place] [address]    Report Status 07/19/2023 FINAL  Final   Organism ID, Bacteria STREPTOCOCCUS PYOGENES  Final      Susceptibility   Streptococcus pyogenes - MIC*    PENICILLIN <=0.06 SENSITIVE Sensitive     CEFTRIAXONE <=0.12 SENSITIVE  Sensitive     ERYTHROMYCIN <=0.12 SENSITIVE Sensitive     LEVOFLOXACIN 2 SENSITIVE Sensitive     VANCOMYCIN 0.5 SENSITIVE Sensitive     * STREPTOCOCCUS PYOGENES  Blood Culture ID Panel (Reflexed)     Status: Abnormal   Collection Time: 07/16/23  4:50 PM  Result Value Ref Range Status  Enterococcus faecalis NOT DETECTED NOT DETECTED Final   Enterococcus Faecium NOT DETECTED NOT DETECTED Final   Listeria monocytogenes NOT DETECTED NOT DETECTED Final   Staphylococcus species NOT DETECTED NOT DETECTED Final   Staphylococcus aureus (BCID) NOT DETECTED NOT DETECTED Final   Staphylococcus epidermidis NOT DETECTED NOT DETECTED Final   Staphylococcus lugdunensis NOT DETECTED NOT DETECTED Final   Streptococcus species DETECTED (A) NOT DETECTED Final    Comment: CRITICAL RESULT CALLED TO, READ BACK BY AND VERIFIED WITH: PHARMD ELIZABETH M. [id number] FCP    Streptococcus agalactiae NOT DETECTED NOT DETECTED Final   Streptococcus pneumoniae NOT DETECTED NOT DETECTED Final   Streptococcus pyogenes DETECTED (A) NOT DETECTED Final    Comment: CRITICAL RESULT CALLED TO, READ BACK BY AND VERIFIED WITH: PHARMD ELIZABETH M. [id number] FCP    A.calcoaceticus-baumannii NOT DETECTED NOT DETECTED Final   Bacteroides fragilis NOT DETECTED NOT DETECTED Final   Enterobacterales NOT DETECTED NOT DETECTED Final   Enterobacter cloacae complex NOT DETECTED NOT DETECTED Final   Escherichia coli NOT DETECTED NOT DETECTED Final   Klebsiella aerogenes NOT DETECTED NOT DETECTED Final   Klebsiella oxytoca NOT DETECTED NOT DETECTED Final   Klebsiella pneumoniae NOT DETECTED NOT DETECTED Final   Proteus species NOT DETECTED NOT DETECTED Final   Salmonella species NOT DETECTED NOT DETECTED Final   Serratia marcescens NOT DETECTED NOT DETECTED Final   Haemophilus influenzae NOT DETECTED NOT DETECTED Final   Neisseria meningitidis NOT DETECTED NOT DETECTED Final   Pseudomonas aeruginosa NOT DETECTED NOT DETECTED  Final   Stenotrophomonas maltophilia NOT DETECTED NOT DETECTED Final   Candida albicans NOT DETECTED NOT DETECTED Final   Candida auris NOT DETECTED NOT DETECTED Final   Candida glabrata NOT DETECTED NOT DETECTED Final   Candida krusei NOT DETECTED NOT DETECTED Final   Candida parapsilosis NOT DETECTED NOT DETECTED Final   Candida tropicalis NOT DETECTED NOT DETECTED Final   Cryptococcus neoformans/gattii NOT DETECTED NOT DETECTED Final    Comment: Performed at [facility], [address], [place], [place] [address]  MRSA Next Gen by PCR, Nasal     Status: Abnormal   Collection Time: 07/16/23  9:37 PM   Specimen: Nasal Mucosa; Nasal Swab  Result Value Ref Range Status   MRSA by PCR Next Gen DETECTED (A) NOT DETECTED Final    Comment: RESULT CALLED TO, READ BACK BY AND VERIFIED WITH: B SCOTT  RN 07/16/2023 @ 2325 BY AB (NOTE) The GeneXpert MRSA Assay (FDA approved for NASAL specimens only), is one component of a comprehensive MRSA colonization surveillance program. It is not intended to diagnose MRSA infection nor to guide or monitor treatment for MRSA infections. Test performance is not FDA approved in patients less than 80 years old. Performed at [facility], [address], [place], [place] [address]   Blood culture (routine x 2)     Status: None (Preliminary result)   Collection Time: 07/16/23 10:36 PM   Specimen: BLOOD RIGHT HAND  Result Value Ref Range Status   Specimen Description BLOOD RIGHT HAND  Final   Special Requests   Final    BOTTLES DRAWN AEROBIC AND ANAEROBIC Blood Culture results may not be optimal due to an inadequate volume of blood received in culture bottles   Culture   Final    NO GROWTH 2 DAYS Performed at [facility], [address] [place], [place] [address]    Report Status PENDING  Incomplete  Urine Culture (  for pregnant, neutropenic or urologic patients or patients with an indwelling urinary catheter)     Status: Abnormal    Collection Time: 07/17/23  7:33 AM   Specimen: Urine, Catheterized  Result Value Ref Range Status   Specimen Description URINE, CATHETERIZED  Final   Special Requests   Final    NONE Performed at [facility], [address] [place], [place] [address]    Culture MULTIPLE SPECIES PRESENT, SUGGEST RECOLLECTION (A)  Final   Report Status 07/18/2023 FINAL  Final  Respiratory (~20 pathogens) panel by PCR     Status: Abnormal   Collection Time: 07/17/23 10:58 AM   Specimen: Nasopharyngeal Swab; Respiratory  Result Value Ref Range Status   Adenovirus NOT DETECTED NOT DETECTED Final   Coronavirus 229E NOT DETECTED NOT DETECTED Final    Comment: (NOTE) The Coronavirus on the Respiratory Panel, DOES NOT test for the novel  Coronavirus (2019 nCoV)    Coronavirus HKU1 NOT DETECTED NOT DETECTED Final   Coronavirus NL63 NOT DETECTED NOT DETECTED Final   Coronavirus OC43 NOT DETECTED NOT DETECTED Final   Metapneumovirus NOT DETECTED NOT DETECTED Final   Rhinovirus / Enterovirus DETECTED (A) NOT DETECTED Final   Influenza A NOT DETECTED NOT DETECTED Final   Influenza B NOT DETECTED NOT DETECTED Final   Parainfluenza Virus 1 NOT DETECTED NOT DETECTED Final   Parainfluenza Virus 2 NOT DETECTED NOT DETECTED Final   Parainfluenza Virus 3 NOT DETECTED NOT DETECTED Final   Parainfluenza Virus 4 NOT DETECTED NOT DETECTED Final   Respiratory Syncytial Virus NOT DETECTED NOT DETECTED Final   Bordetella pertussis NOT DETECTED NOT DETECTED Final   Bordetella Parapertussis NOT DETECTED NOT DETECTED Final   Chlamydophila pneumoniae NOT DETECTED NOT DETECTED Final   Mycoplasma pneumoniae NOT DETECTED NOT DETECTED Final    Comment: Performed at [facility], [address], [place], [place] [address]  Culture, Respiratory w Gram Stain     Status: None (Preliminary result)   Collection Time: 07/17/23 11:14 AM   Specimen: Bronchoalveolar Lavage; Respiratory  Result Value Ref Range Status    Specimen Description BRONCHIAL ALVEOLAR LAVAGE  Final   Special Requests Normal  Final   Gram Stain   Final    ABUNDANT WBC PRESENT, PREDOMINANTLY PMN NO ORGANISMS SEEN    Culture   Final    RARE STAPHYLOCOCCUS AUREUS SUSCEPTIBILITIES TO FOLLOW Performed at [facility], [address], [place], [place] [address]    Report Status PENDING  Incomplete  Acid Fast Smear (AFB)     Status: None   Collection Time: 07/17/23 11:14 AM   Specimen: Bronchoalveolar Lavage; Respiratory  Result Value Ref Range Status   AFB Specimen Processing Concentration  Final   Acid Fast Smear Negative  Final    Comment: (NOTE) Performed At: [facility] [address] [place], [place] [id number] Jolene Schimke MD ZD:[id number]    Source (AFB) BRONCHIAL ALVEOLAR LAVAGE  Final    Comment: Performed at [facility], [address], [place], [place] [address]  Blood Culture ID Panel (Reflexed)     Status: Abnormal   Collection Time: 07/17/23  6:57 PM  Result Value Ref Range Status   Enterococcus faecalis NOT DETECTED NOT DETECTED Final   Enterococcus Faecium NOT DETECTED NOT DETECTED Final   Listeria monocytogenes NOT DETECTED NOT DETECTED Final   Staphylococcus species DETECTED (A) NOT DETECTED Final    Comment: CRITICAL RESULT CALLED TO, READ BACK BY AND  VERIFIED WITH: PHARMD JESSICA MILLEN ON 07/17/23 @ 1700 BY DRT    Staphylococcus aureus (BCID) NOT DETECTED NOT DETECTED Final   Staphylococcus epidermidis DETECTED (A) NOT DETECTED Final    Comment: Methicillin (oxacillin) resistant coagulase negative staphylococcus. Possible blood culture contaminant (unless isolated from more than one blood culture draw or clinical case suggests pathogenicity). No antibiotic treatment is indicated for blood  culture contaminants. PHARMD JESSICA MILLEN ON 07/17/23 @ 1700 BY DRT    Staphylococcus lugdunensis NOT DETECTED NOT DETECTED Final   Streptococcus species NOT DETECTED NOT DETECTED Final    Streptococcus agalactiae NOT DETECTED NOT DETECTED Final   Streptococcus pneumoniae NOT DETECTED NOT DETECTED Final   Streptococcus pyogenes NOT DETECTED NOT DETECTED Final   A.calcoaceticus-baumannii NOT DETECTED NOT DETECTED Final   Bacteroides fragilis NOT DETECTED NOT DETECTED Final   Enterobacterales NOT DETECTED NOT DETECTED Final   Enterobacter cloacae complex NOT DETECTED NOT DETECTED Final   Escherichia coli NOT DETECTED NOT DETECTED Final   Klebsiella aerogenes NOT DETECTED NOT DETECTED Final   Klebsiella oxytoca NOT DETECTED NOT DETECTED Final   Klebsiella pneumoniae NOT DETECTED NOT DETECTED Final   Proteus species NOT DETECTED NOT DETECTED Final   Salmonella species NOT DETECTED NOT DETECTED Final   Serratia marcescens NOT DETECTED NOT DETECTED Final   Haemophilus influenzae NOT DETECTED NOT DETECTED Final   Neisseria meningitidis NOT DETECTED NOT DETECTED Final   Pseudomonas aeruginosa NOT DETECTED NOT DETECTED Final   Stenotrophomonas maltophilia NOT DETECTED NOT DETECTED Final   Candida albicans NOT DETECTED NOT DETECTED Final   Candida auris NOT DETECTED NOT DETECTED Final   Candida glabrata NOT DETECTED NOT DETECTED Final   Candida krusei NOT DETECTED NOT DETECTED Final   Candida parapsilosis NOT DETECTED NOT DETECTED Final   Candida tropicalis NOT DETECTED NOT DETECTED Final   Cryptococcus neoformans/gattii NOT DETECTED NOT DETECTED Final   Methicillin resistance mecA/C DETECTED (A) NOT DETECTED Final    Comment: CRITICAL RESULT CALLED TO, READ BACK BY AND VERIFIED WITH: PHARMD JESSICA MILLEN ON 07/17/23 @ 1700 BY DRT Performed at [facility], [address] [place], [place] [address]      Serology:   Imaging: If present, new imagings (plain films, ct scans, and mri) have been personally visualized and interpreted; radiology reports have been reviewed. Decision making incorporated into the Impression / Recommendations.  12/19 cxr FINDINGS: The heart  size and mediastinal contours are within normal limits. Endotracheal and nasogastric tubes are unchanged. Right internal jugular catheter is unchanged. Mildly increased bibasilar opacities are noted concerning for worsening edema or possibly atypical infection. The visualized skeletal structures are unremarkable.   IMPRESSION: Stable support apparatus. Increased bibasilar opacities as noted above.    07/17/23 cxr IMPRESSION: 1. Satisfactory position of endotracheal tube. 2. No acute cardiopulmonary abnormalities.     12/16 head ct 1. No acute intracranial abnormality. 2. Diffuse, moderately severe paranasal sinus disease.   12/16 ct abd pelv chest 1. Bilateral centrilobular micro nodules and tree-in-bud opacities greatest in the bilateral lower lobes compatible with small airway infection/inflammation and/or aspiration. 2. Small amount of pneumomediastinum. 3. Thick-walled nondistended urinary bladder about a Foley catheter. Correlate with urinalysis to exclude cystitis. 4. Bilateral ureteral stents. Bilateral hydronephrosis is similar to 08/05/2021. 5. Similar prominent retroperitoneal lymph nodes and decreased inguinal lymph nodes.       Raymondo Band, MD [facility] [phone] pager    07/19/2023, 9:58 AM

## 2023-07-19 NOTE — Progress Notes (Addendum)
Pharmacy Antibiotic Note  Marie Brewer is a 42 y.o. female admitted on 07/16/2023 with AMS and found to have strep pyogenes bacteremia. MRSA also noted to be growing in RCx from 12/17. Narrowing Cefepime to Penicillin for now, keeping Linezolid on for now - provides toxin inhibition and MRSA coverage.   Noted the patient remains on CRRT, will dose penicillin at a 75% reduction for now. Will dose intermittent for now - but can be switched to a continuous infusion based on RN preference and line availability.    Plan: - D/c Cefepime - Start PCN 3 million units IV every 4 hours - Continue Linezolid 600 mg IV every 12 hours - Will continue to follow CRRT schedule/duration, culture results, LOT, and antibiotic de-escalation plans   Height: 5\' 6"  (167.6 cm) Weight: 67.9 kg (149 lb 11.1 oz) IBW/kg (Calculated) : 59.3  Temp (24hrs), Avg:98.6 F (37 C), Min:98 F (36.7 C), Max:99.2 F (37.3 C)  Recent Labs  Lab 07/16/23 1652 07/16/23 1807 07/16/23 2007 07/16/23 2130 07/17/23 0322 07/17/23 0455 07/17/23 1418 07/18/23 0346 07/18/23 1524 07/19/23 0300 07/19/23 0656  WBC 46.1*  --   --   --   --  31.3*  --  36.1*  --  47.3* 50.0*  CREATININE 12.13*  --   --    < > 9.56*  --  4.93* 2.47* 1.97* 1.70*  --   LATICACIDVEN  --  6.1* 0.8  --   --   --   --   --   --   --   --    < > = values in this interval not displayed.    Estimated Creatinine Clearance: 39.9 mL/min (A) (by C-G formula based on SCr of 1.7 mg/dL (H)).    Allergies  Allergen Reactions   Imitrex [Sumatriptan Base] Palpitations    Heart races   Cephalexin Rash    Patient passed amoxicillin challenge on 08/18/20, no adverse effect    Cefepime 12/16 >> 12/19 Vancomycin 12/16 x 1 Flagyl 12/17 >> 12/18 Linezolid 12/17 >> Penicillin 12/19 >>  12/16 MRSA PCR >> positive 12/16 BCx >> strep pyogenes + staph epi 12/17 RCx (BAL) >> MRSA 12/17 RVP >> rhino/entero +   Thank you for allowing pharmacy to be a part of this  patient's care.  Georgina Pillion, PharmD, BCPS, BCIDP Infectious Diseases Clinical Pharmacist 07/19/2023 1:51 PM   **Pharmacist phone directory can now be found on [email or web address] ([email or web address]).  Listed under [facility].

## 2023-07-19 NOTE — Progress Notes (Signed)
NAME:  Marie Brewer, MRN:  [id number], DOB:  12-23-1980, LOS: 3 ADMISSION DATE:  07/16/2023, CONSULTATION DATE:  07/16/23 REFERRING MD:  [facility]man-EDP, CHIEF COMPLAINT:  Sepsis, presumed urosepsis   History of present illness   42 year old woman who presented to [facility] ED 12/16 for unresponsiveness. PMHx significant for CKD stage 4, bilateral ureteral obstruction (s/p stent placement 10/2022), cervical CA stage IIB (s/p chemotherapy/XRT, followed at [facility]), polysubstance abuse (current heroin use via inhalation).   Patient presented to [facility] ED with decreased responsiveness x 3 days, per patient's husband (at bedside). Has not been feeling well for about a month but is often hesitant to come to the ED and was adamantly refusing going to the hospital. PO intake has been poor, has been only drinking [facility] and not eating much. No reported fevers at home, but chills/general malaise. Intermittent nausea, no vomiting/diarrhea. Per husband, has complained of abdominal pain/pain with urination. Of note, has bilateral ureteral stents placed in 03/2022; she was supposed to follow up for stent exchange 01/2023 but did not present as scheduled. Additionally, husband notes she continues to use heroin via inhalation, denies injection.   On ED arrival, patient was afebrile, HR 111, BP 126/80, SpO2 100%. She was initially somnolent with GCS 12 but intermittently agitated/not following command, thrashing and causing risk to herself/staff. Ultimately patient was intubated for her safety/airway protection. Labs were notable for WBC 46.1, Hgb 10.8, Plt 704 (suspect concentrated), Na 130, K 5.7, CO2 < 7, BUN 158/Cr 12.13 (baseline ~4). Transaminases/Tbili WNL, Alk Phos mildly elevated. Lipase 133. TSH 4.046. LA 6.1 > 0.8 after IV fluids. CK 198. Ammonia 79, Ethanol < 10, APAP/salicylates negative. UDS pending. VBG 6.974/pCO2 23.4/bicarb 5.4. PCT 29.14, MRSA PCR positive (nares). BCx pending, cefepime/vanc initiated. CT  Chest/A/P demonstrated bilateral centrilobular micronodules (tree-in-bud) greatest in bilateral lower lobes, small amount pneumomediastinum (history of esophageal perf), thick-walled nondistended urinary bladder with Foley, bilateral hydronephrosis with bilateral ureteral stents.   PCCM consulted for ICU admission. Nephro consulted for ARF and emergent CRRT initiation.  Past Medical History  CKD stage IV Bilateral ureteral obstruction status post stent placement April 2024 Cervical cancer stage IIb s/p chemotherapy and radiation Heroin use  Significant Hospital Events   12/16 - Presented to [facility] ED with unresponsiveness, decreased PO intake. Unresponsiveness with intermittent agitation requiring intubation. Found to be in acute renal failure with Cr 12 (baseline ~4), BUN > 100. WBC 46. Briefly required Levophed prior to intubation. Broad-spectrum antibiotic started (cefepime/vanc). RIJ Trialysis catheter placed. CRRT started per Nephro. 12/17- IR placed bilateral percutaneous nephrostomy tubes and removed bilateral ureteral stents  Interim history/subjective:  Tachycardic to 110s, afebrile. CRRT clotted and unable to return blood. Hgb this am to 6.4, given 1 unit Prbcs Off of levophed  Objective   Blood pressure 103/73, pulse (!) 111, temperature 98.2 F (36.8 C), temperature source Oral, resp. rate (!) 30, height 5\' 6"  (1.676 m), weight 67.9 kg, SpO2 95%.    Vent Mode: PRVC FiO2 (%):  [40 %] 40 % Set Rate:  [24 bmp] 24 bmp Vt Set:  [470 mL] 470 mL PEEP:  [5 cmH20] 5 cmH20 Plateau Pressure:  [11 cmH20-18 cmH20] 15 cmH20   Intake/Output Summary (Last 24 hours) at 07/19/2023 0605 Last data filed at 07/19/2023 0600 Gross per 24 hour  Intake 2801.03 ml  Output 2861.2 ml  Net -60.17 ml   Urine output 180 cc 12/18 CRRT 1.5 cc  Filed Weights   07/17/23 0413 07/18/23 0416 07/19/23 0412  Weight:  68.8 kg 67.7 kg 67.9 kg    Examination: General:  In bed on vent, synchronous with  vent HENT: NCAT ETT in place PULM: CTA B, vent supported breathing CV: RRR, no mgr GI: BS+, soft, nontender MSK: normal bulk and tone, no crepitus appreciated on palpation on lower extremities, skin over bilateral skins with numerous ulcers with eschar and purulence, skin is erythematous and warm Neuro: sedated on vent  Labs   CBC: WBC from 36.1 to 47.3 to 50 Hemoglobin from 7.5 to 7 to 6.4 Platelets at 229  Basic Metabolic Panel: Na 133 Bicarb 25 AG 12 BUN 15 K 4.1 Creatinine at 1.7.  Baseline appears to be around 4 180 cc urine out 12/18  12/16 mrsa nares pcr positive 12/17 ucx multiple species 12/17 respiratory culture (BAL) with abundant WBC, rare staph aureus 12/16 bcx 1 of 2 set with group a strep, staph epi by bcid  CXR 12/19: No widened mediastinum. No consolidation or edema seen.  CT abd/pelvis 12/16 IMPRESSION: 1. Bilateral centrilobular micro nodules and tree-in-bud opacities greatest in the bilateral lower lobes compatible with small airway infection/inflammation and/or aspiration. 2. Small amount of pneumomediastinum. 3. Thick-walled nondistended urinary bladder about a Foley catheter. Correlate with urinalysis to exclude cystitis. 4. Bilateral ureteral stents. Bilateral hydronephrosis is similar to 08/05/2021. 5. Similar prominent retroperitoneal lymph nodes and decreased inguinal lymph nodes.  Resolved Hospital Problem list   Severe metabolic acidosis Uremia Hyperkalemia Shock  Assessment & Plan:  43 y.o. year old female with pmh of cervical cancer s/p chemoradiation complicated by ureteral stenosis with stent placement, heroin abuse who present altered mental status and admitted to ICU 12/16 for septic shock.   Group A strep bacteremia, Bilateral lower extremity wounds Rhinovirus History of bilateral ureteral obstruction Blood culture grew MRSE and Group A strep, likely from chronic wounds on lower extremities. Viral panel with rhinovirus. She  was able to have stents removed 12/17 and nephrostomy tubes placed 12/17 with IR. Appreciate ID recommendations in this case, she is now on cefepime, linezolid and metronidazole.   -TTE for gram + bacteremia -continue cefepime and linezolid, ID note mentioning switching to penicillin today will follow -follow AFB, fungal culture -Urine cultures multiple species -strict I and Os  Acute renal failure CKD stage IV Electrolytes improved this morning. She has <200cc urine output yesterday. Likely had ATN with hypoperfusion leading to slow recovery.  -continue CRRT per nephrology, may try to pull fluid if blood pressure tolerates -trend RFP this PM -strict I and Os -avoid nephrotoxic agents  Acute hypoxic respiratory failure Concern for aspiration pneumonitis Intubated for airway protection. Decreasing sedation today.   -Full vent support (4-8cc/kg IBWW) -wean FiO2 for O2 sat>90% -Daily WUA/SBT once appropriate from a mental status standpoint  -VAP bundle -PAD protocol: Propofol and fentanyl RASS -1 to -2 -triglyceride while on propofol  Small volume pneumomediastinum Small amount of pneumomediastinum seen on CT chest 12/16. Has history of esophageal perforation and inhales heroin. Repeat CXR 12/19 without abnormabilities.  - Monitor closely for now - repeat cxr daily  History of cervical cancer Stage IIb, status post chemoradiation diagnosed in 2011.  Followed with Dr. Bertis Ruddy at [facility], last seen 3/23 with recommendations for CT abd/pelvis with 1/23 CT showing lymphadenopathy. CT 07/16/23 with similar prominent retroperitoneal and decreased inguinal lymph nodes.  -continue outpatient follow-up  History of polysubstance abuse with current heroin use, inhalation Husband endorses heroin use. High risk for withdrawal symptoms.  -UDS positive for cocaine  Diabetes Blood sugars  70s-100s today.  -SSI  Best practice:  Diet: NPO, tube feeds Pain/Anxiety/Delirium protocol (if  indicated): propofol, fentanyl DVT prophylaxis: Heparin GI prophylaxis: PPI Glucose control: SSI Code Status: Full Family Communication: husband at bedside  Brandalynn Ofallon M. Adrianah Prophete, D.O.  Internal Medicine Resident, PGY-3 [facility]  Pager: [phone] 6:05 AM, 07/19/2023

## 2023-07-19 NOTE — Progress Notes (Signed)
Chief Complaint: Patient was seen today for bilateral percutaneous nephrostomy tube placement with bilateral ureteral stent removal.   Referring Physician(s): Elmon Kirschner, NP.  Supervising Physician: Richarda Overlie  Patient Status: [facility] - In-pt  Subjective: Patient is 1 day post procedure. Patient is currently sedated and intubated. Husband is not present during visit.   12/19: Patient is 2 days post procedure. Patient is still intubated. Husband is not present during visit today. No concerns expressed about PCN tubes from nursing staff.   Objective:  BP 98/60   Pulse (!) 106   Temp 98.3 F (36.8 C) (Oral)   Resp (!) 28   Ht 5\' 6"  (1.676 m)   Wt 149 lb 11.1 oz (67.9 kg)   SpO2 97%   BMI 24.16 kg/m   Physical Exam: Constitutional: Adult female, laying upright in bed, intubated. HENT: Endotracheal tube in place. Chest: Adequate rise and fall of chest. Patient is intubated.  Extremities: warm and dry.  Skin: Left and right nephrostomy tubes in place posteriorly. Both tubes are attached to gravity bags and thin, red/pink tinged output noted. Clean and dry dressings overly the nephrostomy tube insertion sites. Neuro: Unable to fully assess. Patient is intubated.    Current Facility-Administered Medications:     prismasol BGK 4/2.5 infusion, , CRRT, Continuous, Sanford, Ryan B, MD, Last Rate: 800 mL/hr at 07/19/23 1220, New Bag at 07/19/23 1220    prismasol BGK 4/2.5 infusion, , CRRT, Continuous, Sanford, Ryan B, MD, Last Rate: 400 mL/hr at 07/19/23 0523, New Bag at 07/19/23 0523   0.9 %  sodium chloride infusion (Manually program via Guardrails IV Fluids), , Intravenous, Once, Masters, [place], DO, Held at 07/19/23 8657   Chlorhexidine Gluconate Cloth 2 % PADS 6 each, 6 each, Topical, Daily, Lorin Glass, MD, 6 each at 07/18/23 [id number]   Chlorhexidine Gluconate Cloth 2 % PADS 6 each, 6 each, Topical, Q0600, Lorin Glass, MD, 6 each at 07/19/23 0250   docusate (COLACE)  50 MG/5ML liquid 100 mg, 100 mg, Per Tube, BID, Cloyd Stagers M, PA-C, 100 mg at 07/18/23 2111   etomidate (AMIDATE) injection 21.1 mg, 0.3 mg/kg, Intravenous, Once, Glyn Ade, MD   feeding supplement (PROSource TF20) liquid 60 mL, 60 mL, Per Tube, BID, Leslye Peer, MD, 60 mL at 07/19/23 0908   feeding supplement (VITAL 1.5 CAL) liquid 1,000 mL, 1,000 mL, Per Tube, Continuous, Leslye Peer, MD, Last Rate: 50 mL/hr at 07/19/23 1500, Infusion Verify at 07/19/23 1500   fentaNYL (SUBLIMAZE) bolus via infusion 50-100 mcg, 50-100 mcg, Intravenous, Q15 min PRN, Glyn Ade, MD, 50 mcg at 07/19/23 1335   fentaNYL in NS (9mcg/ml) infusion-PREMIX, 50-200 mcg/hr, Intravenous, Continuous, Cloyd Stagers M, PA-C, Last Rate: 20 mL/hr at 07/19/23 1500, 200 mcg/hr at 07/19/23 1500   heparin 10,000 units/ 20 mL infusion syringe, 350-750 Units/hr, CRRT, Continuous, Coladonato, Jomarie Longs, MD, Last Rate: 1.5 mL/hr at 07/19/23 1500, 750 Units/hr at 07/19/23 1500   heparin injection 1,000-6,000 Units, 1,000-6,000 Units, CRRT, PRN, Terrial Rhodes, MD, 1,200 Units at 07/18/23 1440   heparin injection 5,000 Units, 5,000 Units, Subcutaneous, Q8H, Reese, Stephanie M, PA-C, 5,000 Units at 07/19/23 1320   heparinized saline (2000 units/L) primer fluid for CRRT, , CRRT, PRN, Terrial Rhodes, MD, Given at 07/18/23 1752   insulin aspart (novoLOG) injection 0-9 Units, 0-9 Units, Subcutaneous, Q4H, Reese, Stephanie M, PA-C, 1 Units at 07/19/23 1145   linezolid (ZYVOX) IVPB 600 mg, 600 mg, Intravenous, Q12H, Vu, Trung  T, MD, Stopped at 07/19/23 1105   midazolam (VERSED) injection 1-2 mg, 1-2 mg, Intravenous, Q1H PRN, Cloyd Stagers M, PA-C, 2 mg at 07/19/23 0451   multivitamin (RENA-VIT) tablet 1 tablet, 1 tablet, Per Tube, QHS, Leslye Peer, MD, 1 tablet at 07/18/23 2111   mupirocin ointment (BACTROBAN) 2 % 1 Application, 1 Application, Nasal, BID, Lorin Glass, MD, 1 Application at  07/19/23 6213   norepinephrine (LEVOPHED) 4mg  in (0.016 mg/mL) premix infusion, 0-40 mcg/min, Intravenous, Continuous, Glyn Ade, MD, Stopped at 07/19/23 0518   Oral care mouth rinse, 15 mL, Mouth Rinse, Q2H, Lorin Glass, MD, 15 mL at 07/19/23 1509   Oral care mouth rinse, 15 mL, Mouth Rinse, PRN, Lorin Glass, MD   pantoprazole (PROTONIX) injection 40 mg, 40 mg, Intravenous, QHS, Reese, Stephanie M, PA-C, 40 mg at 07/18/23 2111   penicillin G potassium 3 Million Units in dextrose 50mL IVPB, 3 Million Units, Intravenous, Jennet Maduro, [facility], Last Rate: 100 mL/hr at 07/19/23 1509, 3 Million Units at 07/19/23 1509   polyethylene glycol (MIRALAX / GLYCOLAX) packet 17 g, 17 g, Per Tube, Daily, Cloyd Stagers M, PA-C, 17 g at 07/18/23 0908   prismasol BGK 4/2.5 infusion, , CRRT, Continuous, Sabra Heck B, MD, Last Rate: 2,000 mL/hr at 07/19/23 1344, New Bag at 07/19/23 1344   propofol (DIPRIVAN) 1000 MG/100ML infusion, 0-50 mcg/kg/min, Intravenous, Continuous, Cloyd Stagers M, PA-C, Last Rate: 10.55 mL/hr at 07/19/23 1500, 25 mcg/kg/min at 07/19/23 1500   sodium chloride flush (NS) 0.9 % injection 5 mL, 5 mL, Intracatheter, Q8H, Suttle, Dylan J, MD, 5 mL at 07/19/23 1323  Facility-Administered Medications Ordered in Other Encounters:    acetaminophen (TYLENOL) tablet 650 mg, 650 mg, Oral, Once, Bertis Ruddy, Ni, MD  Labs: CBC Recent Labs    07/19/23 0300 07/19/23 0656  WBC 47.3* 50.0*  HGB 7.0* 6.4*  HCT 23.1* 20.9*  PLT 272 229   BMET Recent Labs    07/18/23 1524 07/19/23 0300  NA 134* 133*  K 3.8 4.1  CL 101 102  CO2 24 25  GLUCOSE 134* 121*  BUN 20 15  CREATININE 1.97* 1.70*  CALCIUM 7.8* 7.8*   LFT Recent Labs    07/16/23 1652 07/17/23 0322 07/19/23 0300  PROT 11.3*  --   --   ALBUMIN 2.7*   < > 1.7*  AST 24  --   --   ALT 15  --   --   ALKPHOS 127*  --   --   BILITOT 0.8  --   --   LIPASE 133*  --   --    < > = values in this  interval not displayed.   PT/INR Recent Labs    07/17/23 1418  LABPROT 15.7*  INR 1.2     Studies/Results: DG CHEST PORT 1 VIEW Result Date: 07/19/2023 CLINICAL DATA:  Pneumomediastinum. EXAM: PORTABLE CHEST 1 VIEW COMPARISON:  July 17, 2023. FINDINGS: The heart size and mediastinal contours are within normal limits. Endotracheal and nasogastric tubes are unchanged. Right internal jugular catheter is unchanged. Mildly increased bibasilar opacities are noted concerning for worsening edema or possibly atypical infection. The visualized skeletal structures are unremarkable. IMPRESSION: Stable support apparatus. Increased bibasilar opacities as noted above. Electronically Signed   By: Lupita Raider M.D.   On: 07/19/2023 09:56   IR NEPHROSTOMY PLACEMENT LEFT Result Date: 07/18/2023 INDICATION: 42 year old female with history of cervical cancer and bilateral nephroureteral stents placed in  2023 presenting with worsening renal function and sepsis, suspected urinary in origin. Request for bilateral nephrostomy tubes and removal of indwelling ureteral stents. EXAM: 1. ULTRASOUND GUIDANCE FOR PUNCTURE OF THE LEFT RENAL COLLECTING SYSTEM 2. LEFT PERCUTANEOUS NEPHROSTOMY TUBE PLACEMENT. 3. Fluoroscopic guided left ureteral stent retrieval. 4. ULTRASOUND GUIDANCE FOR PUNCTURE OF THE RIGHT RENAL COLLECTING SYSTEM 5. RIGHT PERCUTANEOUS NEPHROSTOMY TUBE PLACEMENT. 6. Fluoroscopic guided right ureteral stent retrieval. COMPARISON:  07/16/23 MEDICATIONS: The patient was currently receiving multiple intravenous antibiotics as an inpatient; The antibiotics were administered in an appropriate time frame prior to skin puncture. ANESTHESIA/SEDATION: The patient was intubated and sedated under the care of the Intensive Care Unit. CONTRAST:  25 mL Omnipaque 300 - administered into the renal collecting system FLUOROSCOPY TIME:  134 mGy COMPLICATIONS: None immediate. PROCEDURE: The procedure, risks, benefits, and  alternatives were explained to the patient. Questions regarding the procedure were encouraged and answered. The patient understands and consents to the procedure. A timeout was performed prior to the initiation of the procedure. The left flank region was prepped and draped in the usual sterile fashion and a sterile drape was applied covering the operative field. A sterile gown and sterile gloves were used for the procedure. Local anesthesia was provided with 1% Lidocaine with epinephrine. Ultrasound was used to localize the left kidney. Under direct ultrasound guidance, a 20 gauge needle was advanced into the renal collecting system. An ultrasound image documentation was performed. Over a Nitrex wire, the tract was dilated with an Accustick stent. Next, under intermittent fluoroscpic guidance and over a short Amplatz wire, the track was dilated and a 7 Fr, 45 cm Tourguide sheath was inserted. Through the sheath, a 6 Fr, 3.5 cm loop snare was inserted, the proximal aspect of the ureteral stent was grasped and removed intact. The Amplatz wire was reinserted and the sheath was exchanged for a 10-French percutaneous nephrostomy catheter which was advanced to the level of the renal pelvis where the coil was formed and locked. Contrast was injected and confirmed location within the renal pelvis. The catheter was secured at the skin with a 0-silk retention suture and stat lock device and connected to a gravity bag was placed. Dressings were applied. The right flank region was prepped and draped in the usual sterile fashion and a sterile drape was applied covering the operative field. A sterile gown and sterile gloves were used for the procedure. Local anesthesia was provided with 1% Lidocaine with epinephrine. Ultrasound was used to localize the right kidney. Under direct ultrasound guidance, a 20 gauge needle was advanced into the renal collecting system. An ultrasound image documentation was performed. Over a Nitrex  wire, the tract was dilated with an Accustick stent. Next, under intermittent fluoroscpic guidance and over a short Amplatz wire, the track was dilated and a 7 Fr, 45 cm Tourguide sheath was inserted. Through the sheath, a 6 Fr, 3.5 cm loop snare was inserted, the proximal aspect of the ureteral stent was grasped and removed intact. The Amplatz wire was reinserted and the sheath was exchanged for a 10-French percutaneous nephrostomy catheter which was advanced to the level of the renal pelvis where the coil was formed and locked. Contrast was injected and confirmed location within the renal pelvis. The catheter was secured at the skin with a 0-silk retention suture and stat lock device and connected to a gravity bag was placed. Dressings were applied. The patient tolerated procedure well without immediate postprocedural complication. FINDINGS: Ultrasound scanning demonstrates a minimally dilated left  collecting system and moderated dilated right collecting system with diffuse parenchymal edema. IMPRESSION: 1. Successful ultrasound and fluoroscopic guided left ureteral stent retrieval placement of a left 10 French percutaneous nephrostomy. 2. Successful ultrasound and fluoroscopic guided right ureteral stent retrieval placement of a right 10 French percutaneous nephrostomy. Marliss Coots, MD [facility] Radiology Electronically Signed   By: Marliss Coots M.D.   On: 07/18/2023 08:18   IR NEPHROSTOMY PLACEMENT RIGHT Result Date: 07/18/2023 INDICATION: 42 year old female with history of cervical cancer and bilateral nephroureteral stents placed in 2023 presenting with worsening renal function and sepsis, suspected urinary in origin. Request for bilateral nephrostomy tubes and removal of indwelling ureteral stents. EXAM: 1. ULTRASOUND GUIDANCE FOR PUNCTURE OF THE LEFT RENAL COLLECTING SYSTEM 2. LEFT PERCUTANEOUS NEPHROSTOMY TUBE PLACEMENT. 3. Fluoroscopic guided left  ureteral stent retrieval. 4. ULTRASOUND GUIDANCE FOR PUNCTURE OF THE RIGHT RENAL COLLECTING SYSTEM 5. RIGHT PERCUTANEOUS NEPHROSTOMY TUBE PLACEMENT. 6. Fluoroscopic guided right ureteral stent retrieval. COMPARISON:  07/16/23 MEDICATIONS: The patient was currently receiving multiple intravenous antibiotics as an inpatient; The antibiotics were administered in an appropriate time frame prior to skin puncture. ANESTHESIA/SEDATION: The patient was intubated and sedated under the care of the Intensive Care Unit. CONTRAST:  25 mL Omnipaque 300 - administered into the renal collecting system FLUOROSCOPY TIME:  134 mGy COMPLICATIONS: None immediate. PROCEDURE: The procedure, risks, benefits, and alternatives were explained to the patient. Questions regarding the procedure were encouraged and answered. The patient understands and consents to the procedure. A timeout was performed prior to the initiation of the procedure. The left flank region was prepped and draped in the usual sterile fashion and a sterile drape was applied covering the operative field. A sterile gown and sterile gloves were used for the procedure. Local anesthesia was provided with 1% Lidocaine with epinephrine. Ultrasound was used to localize the left kidney. Under direct ultrasound guidance, a 20 gauge needle was advanced into the renal collecting system. An ultrasound image documentation was performed. Over a Nitrex wire, the tract was dilated with an Accustick stent. Next, under intermittent fluoroscpic guidance and over a short Amplatz wire, the track was dilated and a 7 Fr, 45 cm Tourguide sheath was inserted. Through the sheath, a 6 Fr, 3.5 cm loop snare was inserted, the proximal aspect of the ureteral stent was grasped and removed intact. The Amplatz wire was reinserted and the sheath was exchanged for a 10-French percutaneous nephrostomy catheter which was advanced to the level of the renal pelvis where the coil was formed and locked. Contrast  was injected and confirmed location within the renal pelvis. The catheter was secured at the skin with a 0-silk retention suture and stat lock device and connected to a gravity bag was placed. Dressings were applied. The right flank region was prepped and draped in the usual sterile fashion and a sterile drape was applied covering the operative field. A sterile gown and sterile gloves were used for the procedure. Local anesthesia was provided with 1% Lidocaine with epinephrine. Ultrasound was used to localize the right kidney. Under direct ultrasound guidance, a 20 gauge needle was advanced into the renal collecting system. An ultrasound image documentation was performed. Over a Nitrex wire, the tract was dilated with an Accustick stent. Next, under intermittent fluoroscpic guidance and over a short Amplatz wire, the track was dilated and a 7 Fr, 45 cm Tourguide sheath was inserted. Through the sheath, a 6 Fr, 3.5 cm loop snare was inserted, the  proximal aspect of the ureteral stent was grasped and removed intact. The Amplatz wire was reinserted and the sheath was exchanged for a 10-French percutaneous nephrostomy catheter which was advanced to the level of the renal pelvis where the coil was formed and locked. Contrast was injected and confirmed location within the renal pelvis. The catheter was secured at the skin with a 0-silk retention suture and stat lock device and connected to a gravity bag was placed. Dressings were applied. The patient tolerated procedure well without immediate postprocedural complication. FINDINGS: Ultrasound scanning demonstrates a minimally dilated left collecting system and moderated dilated right collecting system with diffuse parenchymal edema. IMPRESSION: 1. Successful ultrasound and fluoroscopic guided left ureteral stent retrieval placement of a left 10 French percutaneous nephrostomy. 2. Successful ultrasound and fluoroscopic guided right ureteral stent retrieval placement of a  right 10 French percutaneous nephrostomy. Marliss Coots, MD [facility] Radiology Electronically Signed   By: Marliss Coots M.D.   On: 07/18/2023 08:18   IR URETERAL STENT PERC REMOVAL MOD SED Result Date: 07/18/2023 INDICATION: 42 year old female with history of cervical cancer and bilateral nephroureteral stents placed in 2023 presenting with worsening renal function and sepsis, suspected urinary in origin. Request for bilateral nephrostomy tubes and removal of indwelling ureteral stents. EXAM: 1. ULTRASOUND GUIDANCE FOR PUNCTURE OF THE LEFT RENAL COLLECTING SYSTEM 2. LEFT PERCUTANEOUS NEPHROSTOMY TUBE PLACEMENT. 3. Fluoroscopic guided left ureteral stent retrieval. 4. ULTRASOUND GUIDANCE FOR PUNCTURE OF THE RIGHT RENAL COLLECTING SYSTEM 5. RIGHT PERCUTANEOUS NEPHROSTOMY TUBE PLACEMENT. 6. Fluoroscopic guided right ureteral stent retrieval. COMPARISON:  07/16/23 MEDICATIONS: The patient was currently receiving multiple intravenous antibiotics as an inpatient; The antibiotics were administered in an appropriate time frame prior to skin puncture. ANESTHESIA/SEDATION: The patient was intubated and sedated under the care of the Intensive Care Unit. CONTRAST:  25 mL Omnipaque 300 - administered into the renal collecting system FLUOROSCOPY TIME:  134 mGy COMPLICATIONS: None immediate. PROCEDURE: The procedure, risks, benefits, and alternatives were explained to the patient. Questions regarding the procedure were encouraged and answered. The patient understands and consents to the procedure. A timeout was performed prior to the initiation of the procedure. The left flank region was prepped and draped in the usual sterile fashion and a sterile drape was applied covering the operative field. A sterile gown and sterile gloves were used for the procedure. Local anesthesia was provided with 1% Lidocaine with epinephrine. Ultrasound was used to localize the left kidney. Under  direct ultrasound guidance, a 20 gauge needle was advanced into the renal collecting system. An ultrasound image documentation was performed. Over a Nitrex wire, the tract was dilated with an Accustick stent. Next, under intermittent fluoroscpic guidance and over a short Amplatz wire, the track was dilated and a 7 Fr, 45 cm Tourguide sheath was inserted. Through the sheath, a 6 Fr, 3.5 cm loop snare was inserted, the proximal aspect of the ureteral stent was grasped and removed intact. The Amplatz wire was reinserted and the sheath was exchanged for a 10-French percutaneous nephrostomy catheter which was advanced to the level of the renal pelvis where the coil was formed and locked. Contrast was injected and confirmed location within the renal pelvis. The catheter was secured at the skin with a 0-silk retention suture and stat lock device and connected to a gravity bag was placed. Dressings were applied. The right flank region was prepped and draped in the usual sterile fashion and a sterile drape was applied covering the  operative field. A sterile gown and sterile gloves were used for the procedure. Local anesthesia was provided with 1% Lidocaine with epinephrine. Ultrasound was used to localize the right kidney. Under direct ultrasound guidance, a 20 gauge needle was advanced into the renal collecting system. An ultrasound image documentation was performed. Over a Nitrex wire, the tract was dilated with an Accustick stent. Next, under intermittent fluoroscpic guidance and over a short Amplatz wire, the track was dilated and a 7 Fr, 45 cm Tourguide sheath was inserted. Through the sheath, a 6 Fr, 3.5 cm loop snare was inserted, the proximal aspect of the ureteral stent was grasped and removed intact. The Amplatz wire was reinserted and the sheath was exchanged for a 10-French percutaneous nephrostomy catheter which was advanced to the level of the renal pelvis where the coil was formed and locked. Contrast was  injected and confirmed location within the renal pelvis. The catheter was secured at the skin with a 0-silk retention suture and stat lock device and connected to a gravity bag was placed. Dressings were applied. The patient tolerated procedure well without immediate postprocedural complication. FINDINGS: Ultrasound scanning demonstrates a minimally dilated left collecting system and moderated dilated right collecting system with diffuse parenchymal edema. IMPRESSION: 1. Successful ultrasound and fluoroscopic guided left ureteral stent retrieval placement of a left 10 French percutaneous nephrostomy. 2. Successful ultrasound and fluoroscopic guided right ureteral stent retrieval placement of a right 10 French percutaneous nephrostomy. Marliss Coots, MD [facility] Radiology Electronically Signed   By: Marliss Coots M.D.   On: 07/18/2023 08:18   IR URETERAL STENT PERC REMOVAL MOD SED Result Date: 07/18/2023 INDICATION: 42 year old female with history of cervical cancer and bilateral nephroureteral stents placed in 2023 presenting with worsening renal function and sepsis, suspected urinary in origin. Request for bilateral nephrostomy tubes and removal of indwelling ureteral stents. EXAM: 1. ULTRASOUND GUIDANCE FOR PUNCTURE OF THE LEFT RENAL COLLECTING SYSTEM 2. LEFT PERCUTANEOUS NEPHROSTOMY TUBE PLACEMENT. 3. Fluoroscopic guided left ureteral stent retrieval. 4. ULTRASOUND GUIDANCE FOR PUNCTURE OF THE RIGHT RENAL COLLECTING SYSTEM 5. RIGHT PERCUTANEOUS NEPHROSTOMY TUBE PLACEMENT. 6. Fluoroscopic guided right ureteral stent retrieval. COMPARISON:  07/16/23 MEDICATIONS: The patient was currently receiving multiple intravenous antibiotics as an inpatient; The antibiotics were administered in an appropriate time frame prior to skin puncture. ANESTHESIA/SEDATION: The patient was intubated and sedated under the care of the Intensive Care Unit. CONTRAST:  25 mL Omnipaque  300 - administered into the renal collecting system FLUOROSCOPY TIME:  134 mGy COMPLICATIONS: None immediate. PROCEDURE: The procedure, risks, benefits, and alternatives were explained to the patient. Questions regarding the procedure were encouraged and answered. The patient understands and consents to the procedure. A timeout was performed prior to the initiation of the procedure. The left flank region was prepped and draped in the usual sterile fashion and a sterile drape was applied covering the operative field. A sterile gown and sterile gloves were used for the procedure. Local anesthesia was provided with 1% Lidocaine with epinephrine. Ultrasound was used to localize the left kidney. Under direct ultrasound guidance, a 20 gauge needle was advanced into the renal collecting system. An ultrasound image documentation was performed. Over a Nitrex wire, the tract was dilated with an Accustick stent. Next, under intermittent fluoroscpic guidance and over a short Amplatz wire, the track was dilated and a 7 Fr, 45 cm Tourguide sheath was inserted. Through the sheath, a 6 Fr, 3.5 cm loop snare was inserted, the proximal  aspect of the ureteral stent was grasped and removed intact. The Amplatz wire was reinserted and the sheath was exchanged for a 10-French percutaneous nephrostomy catheter which was advanced to the level of the renal pelvis where the coil was formed and locked. Contrast was injected and confirmed location within the renal pelvis. The catheter was secured at the skin with a 0-silk retention suture and stat lock device and connected to a gravity bag was placed. Dressings were applied. The right flank region was prepped and draped in the usual sterile fashion and a sterile drape was applied covering the operative field. A sterile gown and sterile gloves were used for the procedure. Local anesthesia was provided with 1% Lidocaine with epinephrine. Ultrasound was used to localize the right kidney. Under  direct ultrasound guidance, a 20 gauge needle was advanced into the renal collecting system. An ultrasound image documentation was performed. Over a Nitrex wire, the tract was dilated with an Accustick stent. Next, under intermittent fluoroscpic guidance and over a short Amplatz wire, the track was dilated and a 7 Fr, 45 cm Tourguide sheath was inserted. Through the sheath, a 6 Fr, 3.5 cm loop snare was inserted, the proximal aspect of the ureteral stent was grasped and removed intact. The Amplatz wire was reinserted and the sheath was exchanged for a 10-French percutaneous nephrostomy catheter which was advanced to the level of the renal pelvis where the coil was formed and locked. Contrast was injected and confirmed location within the renal pelvis. The catheter was secured at the skin with a 0-silk retention suture and stat lock device and connected to a gravity bag was placed. Dressings were applied. The patient tolerated procedure well without immediate postprocedural complication. FINDINGS: Ultrasound scanning demonstrates a minimally dilated left collecting system and moderated dilated right collecting system with diffuse parenchymal edema. IMPRESSION: 1. Successful ultrasound and fluoroscopic guided left ureteral stent retrieval placement of a left 10 French percutaneous nephrostomy. 2. Successful ultrasound and fluoroscopic guided right ureteral stent retrieval placement of a right 10 French percutaneous nephrostomy. Marliss Coots, MD [facility] Radiology Electronically Signed   By: Marliss Coots M.D.   On: 07/18/2023 08:18      12/19    Assessment/Plan: 12/18: sepsis and hydronephrosis Bilateral percutaneous nephrostomy tubes and bilateral ureteral stents removed 12/17 by Dr. Andrey Campanile.  Tubes are in place and attached to gravity bags. Sites have overlying clean and dry dressings. Blood tinged output noted. I/O left nephrostomy tube: 30mL and  right: 35mL within the last 24 hours. Continue gravity bags and monitor output. Appreciate urology and critical care assistance. IR will continue to follow the patient.   12/19 Sepsis and hydronephrosis Bilateral PCN tubes continue to have thin, red/pink tinged output. Minimal output from both PCN tubes. HGB dropped to 6.4 today. If HGB continues to drop, will consider CT abdomen and pelvis w/o contrast.  Continue to monitor output. Replace any saturated dressings.  IR will continue to follow.    LOS: 3 days   I spent a total of 20 minutes in face to face in clinical consultation, greater than 50% of which was counseling/coordinating care for Bilateral percutaneous nephrostomy tube placement with bilateral ureteral stent removal.   Laisa Larrick N Johntavious Francom PA-C 07/19/2023 3:27 PM

## 2023-07-19 NOTE — Procedures (Signed)
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  K is 4.1 Hb 6.4 for pRBC  O: 12/18 0701 - 12/19 0700 In: 2779.9 [I.V.:833; NG/GT:1006.7; IV Piggyback:910.3] Out: 2989.3 [Urine:180]  Filed Weights   07/17/23 0413 07/18/23 0416 07/19/23 0412  Weight: 68.8 kg 67.7 kg 67.9 kg    Recent Labs  Lab 07/18/23 0346 07/18/23 1524 07/19/23 0300  NA 135 134* 133*  K 4.4 3.8 4.1  CL 101 101 102  CO2 22 24 25   GLUCOSE 110* 134* 121*  BUN 33* 20 15  CREATININE 2.47* 1.97* 1.70*  CALCIUM 8.0* 7.8* 7.8*  PHOS 2.4* 1.9* 1.8*   Recent Labs  Lab 07/16/23 1652 07/16/23 1658 07/18/23 0346 07/19/23 0300 07/19/23 0656  WBC 46.1*   < > 36.1* 47.3* 50.0*  NEUTROABS 42.0*  --   --   --  45.5*  HGB 10.8*   < > 7.5* 7.0* 6.4*  HCT 36.0   < > 23.6* 23.1* 20.9*  MCV 95.2   < > 92.5 96.3 96.8  PLT 704*   < > 348 272 229   < > = values in this interval not displayed.    Scheduled Meds:  sodium chloride   Intravenous Once   Chlorhexidine Gluconate Cloth  6 each Topical Daily   Chlorhexidine Gluconate Cloth  6 each Topical Q0600   docusate  100 mg Per Tube BID   etomidate  0.3 mg/kg Intravenous Once   feeding supplement (PROSource TF20)  60 mL Per Tube BID   heparin  5,000 Units Subcutaneous Q8H   insulin aspart  0-9 Units Subcutaneous Q4H   multivitamin  1 tablet Per Tube QHS   mupirocin ointment   1 Application Nasal BID   mouth rinse  15 mL Mouth Rinse Q2H   pantoprazole (PROTONIX) IV  40 mg Intravenous QHS   polyethylene glycol  17 g Per Tube Daily   sodium chloride flush  5 mL Intracatheter Q8H   Continuous Infusions:   prismasol BGK 4/2.5 800 mL/hr at 07/19/23 0521    prismasol BGK 4/2.5 400 mL/hr at 07/19/23 0523   ceFEPime (MAXIPIME) IV Stopped (07/19/23 0534)   feeding supplement (VITAL 1.5 CAL) 50 mL/hr at 07/19/23 0902   fentaNYL infusion INTRAVENOUS 200 mcg/hr (07/19/23 0900)   heparin 10,000 units/ 20 mL infusion syringe 750 Units/hr (07/19/23 0920)   linezolid (ZYVOX) IV Stopped (07/18/23 2242)   norepinephrine (LEVOPHED) Adult infusion Stopped (07/19/23 0518)   potassium PHOSPHATE IVPB (in mmol) 85 mL/hr at 07/19/23 0900   prismasol BGK 4/2.5 2,000 mL/hr at 07/19/23 0837   propofol (DIPRIVAN) infusion 15 mcg/kg/min (07/19/23 0900)   PRN Meds:.fentaNYL, heparin, heparin, midazolam, mouth rinse  ABG    Component Value Date/Time   PHART 6.935 (LL) 07/16/2023 2039   PCO2ART 49.6 (H) 07/16/2023 2039   PO2ART 497 (H) 07/16/2023 2039   HCO3 23.7 07/17/2023 1016   TCO2 25 07/17/2023 1016   ACIDBASEDEF  3.0 (H) 07/17/2023 1016   O2SAT 79 07/17/2023 1016    A 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

## 2023-07-19 NOTE — Plan of Care (Signed)
  Problem: Fluid Volume: Goal: Ability to maintain a balanced intake and output will improve Outcome: Progressing   Problem: Metabolic: Goal: Ability to maintain appropriate glucose levels will improve Outcome: Progressing   Problem: Nutritional: Goal: Maintenance of adequate nutrition will improve Outcome: Progressing   Problem: Skin Integrity: Goal: Risk for impaired skin integrity will decrease Outcome: Progressing   

## 2023-07-20 ENCOUNTER — Inpatient Hospital Stay (HOSPITAL_COMMUNITY): Payer: Commercial Managed Care - HMO

## 2023-07-20 DIAGNOSIS — A419 Sepsis, unspecified organism: Secondary | ICD-10-CM | POA: Diagnosis not present

## 2023-07-20 DIAGNOSIS — R6521 Severe sepsis with septic shock: Secondary | ICD-10-CM | POA: Diagnosis not present

## 2023-07-20 DIAGNOSIS — R7881 Bacteremia: Secondary | ICD-10-CM | POA: Diagnosis not present

## 2023-07-20 DIAGNOSIS — J9691 Respiratory failure, unspecified with hypoxia: Secondary | ICD-10-CM | POA: Diagnosis not present

## 2023-07-20 DIAGNOSIS — N390 Urinary tract infection, site not specified: Secondary | ICD-10-CM | POA: Diagnosis not present

## 2023-07-20 LAB — RENAL FUNCTION PANEL
Albumin: 1.7 g/dL — ABNORMAL LOW (ref 3.5–5.0)
Albumin: 1.7 g/dL — ABNORMAL LOW (ref 3.5–5.0)
Anion gap: 8 (ref 5–15)
Anion gap: 7 (ref 5–15)
BUN: 11 mg/dL (ref 6–20)
BUN: 15 mg/dL (ref 6–20)
CO2: 23 mmol/L (ref 22–32)
CO2: 24 mmol/L (ref 22–32)
Calcium: 8 mg/dL — ABNORMAL LOW (ref 8.9–10.3)
Calcium: 8.1 mg/dL — ABNORMAL LOW (ref 8.9–10.3)
Chloride: 101 mmol/L (ref 98–111)
Chloride: 99 mmol/L (ref 98–111)
Creatinine, Ser: 1.49 mg/dL — ABNORMAL HIGH (ref 0.44–1.00)
Creatinine, Ser: 1.62 mg/dL — ABNORMAL HIGH (ref 0.44–1.00)
GFR, Estimated: 44 mL/min — ABNORMAL LOW (ref >60)
GFR, Estimated: 40 mL/min — ABNORMAL LOW (ref >60)
Glucose, Bld: 152 mg/dL — ABNORMAL HIGH (ref 70–99)
Glucose, Bld: 141 mg/dL — ABNORMAL HIGH (ref 70–99)
Phosphorus: 2.2 mg/dL — ABNORMAL LOW (ref 2.5–4.6)
Phosphorus: 5.9 mg/dL — ABNORMAL HIGH (ref 2.5–4.6)
Potassium: 4.3 mmol/L (ref 3.5–5.1)
Potassium: 4.5 mmol/L (ref 3.5–5.1)
Sodium: 132 mmol/L — ABNORMAL LOW (ref 135–145)
Sodium: 130 mmol/L — ABNORMAL LOW (ref 135–145)

## 2023-07-20 LAB — TYPE AND SCREEN
ABO/RH(D): O NEG
Antibody Screen: NEGATIVE
Unit division: 0

## 2023-07-20 LAB — BPAM RBC
Blood Product Expiration Date: 202412252359
ISSUE DATE / TIME: 202412190839
Unit Type and Rh: 9500

## 2023-07-20 LAB — C DIFFICILE (CDIFF) QUICK SCRN (NO PCR REFLEX)
C Diff antigen: NEGATIVE
C Diff interpretation: NOT DETECTED
C Diff toxin: NEGATIVE

## 2023-07-20 LAB — CBC
HCT: 26.1 % — ABNORMAL LOW (ref 36.0–46.0)
Hemoglobin: 8 g/dL — ABNORMAL LOW (ref 12.0–15.0)
MCH: 29.3 pg (ref 26.0–34.0)
MCHC: 30.7 g/dL (ref 30.0–36.0)
MCV: 95.6 fL (ref 80.0–100.0)
Platelets: 243 10*3/uL (ref 150–400)
RBC: 2.73 MIL/uL — ABNORMAL LOW (ref 3.87–5.11)
RDW: 17.5 % — ABNORMAL HIGH (ref 11.5–15.5)
WBC: 58.2 10*3/uL (ref 4.0–10.5)
nRBC: 0.7 % — ABNORMAL HIGH (ref 0.0–0.2)

## 2023-07-20 LAB — GLUCOSE, CAPILLARY
Glucose-Capillary: 134 mg/dL — ABNORMAL HIGH (ref 70–99)
Glucose-Capillary: 116 mg/dL — ABNORMAL HIGH (ref 70–99)
Glucose-Capillary: 135 mg/dL — ABNORMAL HIGH (ref 70–99)
Glucose-Capillary: 124 mg/dL — ABNORMAL HIGH (ref 70–99)
Glucose-Capillary: 115 mg/dL — ABNORMAL HIGH (ref 70–99)
Glucose-Capillary: 133 mg/dL — ABNORMAL HIGH (ref 70–99)

## 2023-07-20 LAB — ECHOCARDIOGRAM COMPLETE
Area-P 1/2: 7.33 cm2
Calc EF: 71.7 %
Height: 66 in
S' Lateral: 3 cm
Single Plane A2C EF: 71.8 %
Single Plane A4C EF: 68.9 %
Weight: 2352.75 [oz_av]

## 2023-07-20 LAB — TRIGLYCERIDES: Triglycerides: 193 mg/dL — ABNORMAL HIGH (ref <150)

## 2023-07-20 LAB — CORTISOL: Cortisol, Plasma: 17.4 ug/dL

## 2023-07-20 LAB — APTT: aPTT: 62 s — ABNORMAL HIGH (ref 24–36)

## 2023-07-20 LAB — MAGNESIUM: Magnesium: 2.3 mg/dL (ref 1.7–2.4)

## 2023-07-20 MED ORDER — METHADONE HCL 10 MG PO TABS
5.0000 mg | ORAL_TABLET | Freq: Three times a day (TID) | ORAL | Status: DC
  Administered 2023-07-20 – 2023-07-22 (×6): 5 mg
  Filled 2023-07-20 (×6): qty 1

## 2023-07-20 MED ORDER — ACETAMINOPHEN 160 MG/5ML PO SOLN
650.0000 mg | Freq: Four times a day (QID) | ORAL | Status: AC | PRN
  Administered 2023-07-21 – 2023-07-24 (×2): 650 mg
  Filled 2023-07-20 (×2): qty 20.3

## 2023-07-20 MED ORDER — LINEZOLID 600 MG PO TABS
600.0000 mg | ORAL_TABLET | Freq: Two times a day (BID) | ORAL | Status: AC
  Administered 2023-07-20 – 2023-07-23 (×7): 600 mg
  Filled 2023-07-20 (×8): qty 1

## 2023-07-20 MED ORDER — DEXTROSE 5 % IV SOLN
30.0000 mmol | Freq: Once | INTRAVENOUS | Status: AC
  Administered 2023-07-20: 30 mmol via INTRAVENOUS
  Filled 2023-07-20: qty 10

## 2023-07-20 MED ORDER — BANATROL TF EN LIQD
60.0000 mL | Freq: Two times a day (BID) | ENTERAL | Status: DC
Start: 2023-07-20 — End: 2023-07-25
  Administered 2023-07-20 – 2023-07-24 (×10): 60 mL
  Filled 2023-07-20 (×10): qty 60

## 2023-07-20 NOTE — Progress Notes (Signed)
[facility]  Date of Admission:  07/16/2023     Lines:  12/17-c bilateral nephrostomy tubes 12/16-c right internal jugular hd catheter     Abx: 12/17-c linezolid 12/19-c penicillin g  12/16-19 cefepime 12/17-12/18 metronidazole 12/16 vanc                                                           Assessment: 42 yo female with hx cervical cancer s/p chemoxrt and brachytherapy (unclear status of disease), presence of bilateral ureteral stent for distal ureteral stricture/obstructive uropathy (stents in for a year), ckd stage 4, admitted with acute ams, abd pain, hematuria, found to have:   Septic shock -- bp 42/16 on presentation cellulitis Group a strep bacteremia Hypoxic respiratory failure -- intubated on presentation in setting septic shock Ct imaging distended bladder and tree in bud bilateral basal lungs  AKI     12/16 mrsa nares pcr positive 12/17 ucx in progress 12/17 respiratory culture (BAL) in progress 12/16 bcx 1 of 2 set with group a strep by bcid   Source for group a strep might be lower ext open wound/cellulitis. No sign of nec fasc now but would need to closely monitor   Doesn't appear to have toxic shock syndrome at this time     She has chronic intermittent dysuria in setting of cervical cancer/xrt and bilateral ureteral stricture. No clinical change per history. Ct showed stable hydronephrosis   Bilateral basilar chest ct finding tree in bud ?aspiration. S/p BAL sampling     At this time covering broadly with abx    ------------ 12/18 id assessment Off pressors this morning Repeat bcx 12/17 CoNS likely contaminant 12/17 BAL cx ngtd 12/17 s/p ureteral stent removal bilaterally and placement perc-nephrostomy tubes 12/17 respiratory viral pcr rhino virus  12/17 ucx multiple species    Bilateral LE open wounds/scabs and well marked erythema without fluctuance/blister.   Suspect source of infection/sepsis  mainly driven by group a strep process with rhinovirus minorly contribute and explaining tree in bud changes on chest imaging   Urine process chronic and doesn't appear to contribute  Off crrt for the latter part of today   12/19 id assessment Crrt restarted and there was concern for membranes being clotted off with failed blood return --> hb 6's Remains off levophed Rash in legs stable no sign of nec fasc  1 episode of diarrhea today 12/19 am. Wbc up to 50 but maybe reactive due to noninfectious cause. Will monitor Abdomen soft Chest xray for the most part clear with nonspecific bibasilar opacity  Bal cx from 12/17 rare mrsa   Repeat bcx 12/16 showed CoNS contaminant  ----------- 07/20/23 id assessment Remains on crrt Afebrile Sbp 100s-110s No rash on exam but mild lft yesterday; bilateral LE cellulitis looks clinicaly improved Diarrhea likely tube feed/abx related -- cdiff testing negative Stable low vent level requirement -- no sign of HAP; cxr improved basilar opacities Pcn's draining well but query if some intraabd/renal abscess had developed  Moderate eosinophilia new 12/19 and leukemoid reaction ?adrenal insufficiency  Repeat bcx sent per primary team today 07/20/23   Pending tte  Plan:  Await tte Cortisol Repeat abd pelv ct noncontrast -- discussed with renal and still chance of renal recovery F/u  repeat bcx data Continue zyvox and pcn-g Other supportive care per pulm ccm Discussed with primary team     Principal Problem:   Sepsis due to urinary tract infection (HCC) Active Problems:   Acute respiratory failure (HCC)   Altered mental status   Protein-calorie malnutrition, severe   Allergies  Allergen Reactions   Imitrex [Sumatriptan Base] Palpitations    Heart races   Cephalexin Rash    Patient passed amoxicillin challenge on 08/18/20, no adverse effect    Scheduled Meds:  sodium chloride   Intravenous Once   Chlorhexidine Gluconate Cloth  6  each Topical Daily   docusate  100 mg Per Tube BID   etomidate  0.3 mg/kg Intravenous Once   feeding supplement (PROSource TF20)  60 mL Per Tube BID   fiber supplement (BANATROL TF)  60 mL Per Tube BID   insulin aspart  0-9 Units Subcutaneous Q4H   linezolid  600 mg Per Tube Q12H   methadone  5 mg Per Tube Q8H   multivitamin  1 tablet Per Tube QHS   mupirocin ointment  1 Application Nasal BID   mouth rinse  15 mL Mouth Rinse Q2H   pantoprazole (PROTONIX) IV  40 mg Intravenous QHS   polyethylene glycol  17 g Per Tube Daily   sodium chloride flush  5 mL Intracatheter Q8H   Continuous Infusions:   prismasol BGK 4/2.5 800 mL/hr at 07/20/23 0747    prismasol BGK 4/2.5 400 mL/hr at 07/19/23 1840   feeding supplement (VITAL 1.5 CAL) 50 mL/hr at 07/20/23 1200   fentaNYL infusion INTRAVENOUS 175 mcg/hr (07/20/23 1200)   heparin 10,000 units/ 20 mL infusion syringe 750 Units/hr (07/20/23 1200)   norepinephrine (LEVOPHED) Adult infusion Stopped (07/20/23 0411)   pencillin G potassium IV 3 Million Units (07/20/23 1203)   prismasol BGK 4/2.5 2,000 mL/hr at 07/20/23 0915   propofol (DIPRIVAN) infusion 50 mcg/kg/min (07/20/23 1214)   sodium PHOSPHATE IVPB (in mmol) 43 mL/hr at 07/20/23 1200   PRN Meds:.fentaNYL, heparin, heparin, midazolam, mouth rinse   SUBJECTIVE: Rising wbc Afebrile Remains on crrt Diarrhea stable -- rectal tube placed; cdiff testing negative  No rash Remains sedated/comatoxe; minimal vent Pcn drainings     Review of Systems: ROS All other ROS was negative, except mentioned above     OBJECTIVE: Vitals:   07/20/23 1100 07/20/23 1137 07/20/23 1200 07/20/23 1214  BP: 109/70  106/72   Pulse: (!) 119  (!) 118 (!) 117  Resp: (!) 25  (!) 26 (!) 27  Temp:  98.5 F (36.9 C)    TempSrc:  Axillary    SpO2: 97%  97% 98%  Weight:      Height:       Body mass index is 23.73 kg/m.  Physical Exam General/constitutional: ill appearing, intubated,  sedated/comatose HEENT: Normocephalic, PER, Conj Clear CV: rrr no mrg Lungs: clear on vent -- minimal vent setting 40% fio2 Abd: Soft, Nontender Skin/Ext: bilateral LE with clean dressing; no fluctuance Neuro: sedated/intubated MSK: no peripheral joint swelling/warmth  Gu bilateral nephrostomy tube draining urine with sediments in both bags; no purulent material  Central line presence: right internal jugular hd cath site no redness/purulence   Lab Results Lab Results  Component Value Date   WBC 58.2 (HH) 07/20/2023   HGB 8.0 (L) 07/20/2023   HCT 26.1 (L) 07/20/2023   MCV 95.6 07/20/2023   PLT 243 07/20/2023    Lab Results  Component Value Date   CREATININE 1.49 (H)  07/20/2023   BUN 11 07/20/2023   NA 132 (L) 07/20/2023   K 4.3 07/20/2023   CL 101 07/20/2023   CO2 23 07/20/2023    Lab Results  Component Value Date   ALT 61 (H) 07/19/2023   AST 131 (H) 07/19/2023   ALKPHOS 122 07/19/2023   BILITOT 0.7 07/19/2023      Microbiology: Recent Results (from the past 240 hours)  Resp panel by RT-PCR (RSV, Flu A&B, Covid) Anterior Nasal Swab     Status: None   Collection Time: 07/16/23  4:49 PM   Specimen: Anterior Nasal Swab  Result Value Ref Range Status   SARS Coronavirus 2 by RT PCR NEGATIVE NEGATIVE Final   Influenza A by PCR NEGATIVE NEGATIVE Final   Influenza B by PCR NEGATIVE NEGATIVE Final    Comment: (NOTE) The Xpert Xpress SARS-CoV-2/FLU/RSV plus assay is intended as an aid in the diagnosis of influenza from Nasopharyngeal swab specimens and should not be used as a sole basis for treatment. Nasal washings and aspirates are unacceptable for Xpert Xpress SARS-CoV-2/FLU/RSV testing.  Fact Sheet for Patients: BloggerCourse.com  Fact Sheet for Healthcare Providers: SeriousBroker.it  This test is not yet approved or cleared by the [place] FDA and has been authorized for detection and/or diagnosis of  SARS-CoV-2 by FDA under an Emergency Use Authorization (EUA). This EUA will remain in effect (meaning this test can be used) for the duration of the COVID-19 declaration under Section 564(b)(1) of the Act, 21 U.S.C. section 360bbb-3(b)(1), unless the authorization is terminated or revoked.     Resp Syncytial Virus by PCR NEGATIVE NEGATIVE Final    Comment: (NOTE) Fact Sheet for Patients: BloggerCourse.com  Fact Sheet for Healthcare Providers: SeriousBroker.it  This test is not yet approved or cleared by the [place] FDA and has been authorized for detection and/or diagnosis of SARS-CoV-2 by FDA under an Emergency Use Authorization (EUA). This EUA will remain in effect (meaning this test can be used) for the duration of the COVID-19 declaration under Section 564(b)(1) of the Act, 21 U.S.C. section 360bbb-3(b)(1), unless the authorization is terminated or revoked.  Performed at [facility], [address], [place], [place] [address]   Blood culture (routine x 2)     Status: Abnormal   Collection Time: 07/16/23  4:50 PM   Specimen: BLOOD  Result Value Ref Range Status   Specimen Description BLOOD SITE NOT SPECIFIED  Final   Special Requests   Final    BOTTLES DRAWN AEROBIC ONLY Blood Culture adequate volume   Culture  Setup Time   Final    GRAM POSITIVE COCCI IN CHAINS ANAEROBIC BOTTLE ONLY CRITICAL RESULT CALLED TO, READ BACK BY AND VERIFIED WITH: PHARMD ELIZABETH M. [id number] FCP GRAM POSITIVE COCCI IN CLUSTERS BOTTLES DRAWN AEROBIC ONLY CRITICAL RESULT CALLED TO, READ BACK BY AND VERIFIED WITH: PHARMD JESSICA MILLEN ON 07/17/23 @ 1723 BY DRT    Culture (A)  Final    STREPTOCOCCUS PYOGENES STAPHYLOCOCCUS EPIDERMIDIS THE SIGNIFICANCE OF ISOLATING THIS ORGANISM FROM A SINGLE SET OF BLOOD CULTURES WHEN MULTIPLE SETS ARE DRAWN IS UNCERTAIN. PLEASE NOTIFY THE MICROBIOLOGY DEPARTMENT WITHIN ONE WEEK IF SPECIATION AND  SENSITIVITIES ARE REQUIRED. HEALTH DEPARTMENT NOTIFIED Performed at [facility], [address] [place], [place] [address]    Report Status 07/19/2023 FINAL  Final   Organism ID, Bacteria STREPTOCOCCUS PYOGENES  Final      Susceptibility   Streptococcus pyogenes - MIC*    PENICILLIN <=  0.06 SENSITIVE Sensitive     CEFTRIAXONE <=0.12 SENSITIVE Sensitive     ERYTHROMYCIN <=0.12 SENSITIVE Sensitive     LEVOFLOXACIN 2 SENSITIVE Sensitive     VANCOMYCIN 0.5 SENSITIVE Sensitive     * STREPTOCOCCUS PYOGENES  Blood Culture ID Panel (Reflexed)     Status: Abnormal   Collection Time: 07/16/23  4:50 PM  Result Value Ref Range Status   Enterococcus faecalis NOT DETECTED NOT DETECTED Final   Enterococcus Faecium NOT DETECTED NOT DETECTED Final   Listeria monocytogenes NOT DETECTED NOT DETECTED Final   Staphylococcus species NOT DETECTED NOT DETECTED Final   Staphylococcus aureus (BCID) NOT DETECTED NOT DETECTED Final   Staphylococcus epidermidis NOT DETECTED NOT DETECTED Final   Staphylococcus lugdunensis NOT DETECTED NOT DETECTED Final   Streptococcus species DETECTED (A) NOT DETECTED Final    Comment: CRITICAL RESULT CALLED TO, READ BACK BY AND VERIFIED WITH: PHARMD ELIZABETH M. [id number] FCP    Streptococcus agalactiae NOT DETECTED NOT DETECTED Final   Streptococcus pneumoniae NOT DETECTED NOT DETECTED Final   Streptococcus pyogenes DETECTED (A) NOT DETECTED Final    Comment: CRITICAL RESULT CALLED TO, READ BACK BY AND VERIFIED WITH: PHARMD ELIZABETH M. [id number] FCP    A.calcoaceticus-baumannii NOT DETECTED NOT DETECTED Final   Bacteroides fragilis NOT DETECTED NOT DETECTED Final   Enterobacterales NOT DETECTED NOT DETECTED Final   Enterobacter cloacae complex NOT DETECTED NOT DETECTED Final   Escherichia coli NOT DETECTED NOT DETECTED Final   Klebsiella aerogenes NOT DETECTED NOT DETECTED Final   Klebsiella oxytoca NOT DETECTED NOT DETECTED Final   Klebsiella pneumoniae  NOT DETECTED NOT DETECTED Final   Proteus species NOT DETECTED NOT DETECTED Final   Salmonella species NOT DETECTED NOT DETECTED Final   Serratia marcescens NOT DETECTED NOT DETECTED Final   Haemophilus influenzae NOT DETECTED NOT DETECTED Final   Neisseria meningitidis NOT DETECTED NOT DETECTED Final   Pseudomonas aeruginosa NOT DETECTED NOT DETECTED Final   Stenotrophomonas maltophilia NOT DETECTED NOT DETECTED Final   Candida albicans NOT DETECTED NOT DETECTED Final   Candida auris NOT DETECTED NOT DETECTED Final   Candida glabrata NOT DETECTED NOT DETECTED Final   Candida krusei NOT DETECTED NOT DETECTED Final   Candida parapsilosis NOT DETECTED NOT DETECTED Final   Candida tropicalis NOT DETECTED NOT DETECTED Final   Cryptococcus neoformans/gattii NOT DETECTED NOT DETECTED Final    Comment: Performed at [facility], [address], [place], [place] [address]  MRSA Next Gen by PCR, Nasal     Status: Abnormal   Collection Time: 07/16/23  9:37 PM   Specimen: Nasal Mucosa; Nasal Swab  Result Value Ref Range Status   MRSA by PCR Next Gen DETECTED (A) NOT DETECTED Final    Comment: RESULT CALLED TO, READ BACK BY AND VERIFIED WITH: B SCOTT  RN 07/16/2023 @ 2325 BY AB (NOTE) The GeneXpert MRSA Assay (FDA approved for NASAL specimens only), is one component of a comprehensive MRSA colonization surveillance program. It is not intended to diagnose MRSA infection nor to guide or monitor treatment for MRSA infections. Test performance is not FDA approved in patients less than 40 years old. Performed at [facility], [address] [place], [place] [address]   Blood culture (routine x 2)     Status: None (Preliminary result)   Collection Time: 07/16/23 10:36 PM   Specimen: BLOOD RIGHT HAND  Result Value Ref Range Status   Specimen Description BLOOD RIGHT HAND  Final  Special Requests   Final    BOTTLES DRAWN AEROBIC AND ANAEROBIC Blood Culture results may not be optimal  due to an inadequate volume of blood received in culture bottles   Culture   Final    NO GROWTH 4 DAYS Performed at [facility], [address] [place], [place] [address]    Report Status PENDING  Incomplete  Urine Culture (for pregnant, neutropenic or urologic patients or patients with an indwelling urinary catheter)     Status: Abnormal   Collection Time: 07/17/23  7:33 AM   Specimen: Urine, Catheterized  Result Value Ref Range Status   Specimen Description URINE, CATHETERIZED  Final   Special Requests   Final    NONE Performed at [facility], [address] [place], [place] [address]    Culture MULTIPLE SPECIES PRESENT, SUGGEST RECOLLECTION (A)  Final   Report Status 07/18/2023 FINAL  Final  Respiratory (~20 pathogens) panel by PCR     Status: Abnormal   Collection Time: 07/17/23 10:58 AM   Specimen: Nasopharyngeal Swab; Respiratory  Result Value Ref Range Status   Adenovirus NOT DETECTED NOT DETECTED Final   Coronavirus 229E NOT DETECTED NOT DETECTED Final    Comment: (NOTE) The Coronavirus on the Respiratory Panel, DOES NOT test for the novel  Coronavirus (2019 nCoV)    Coronavirus HKU1 NOT DETECTED NOT DETECTED Final   Coronavirus NL63 NOT DETECTED NOT DETECTED Final   Coronavirus OC43 NOT DETECTED NOT DETECTED Final   Metapneumovirus NOT DETECTED NOT DETECTED Final   Rhinovirus / Enterovirus DETECTED (A) NOT DETECTED Final   Influenza A NOT DETECTED NOT DETECTED Final   Influenza B NOT DETECTED NOT DETECTED Final   Parainfluenza Virus 1 NOT DETECTED NOT DETECTED Final   Parainfluenza Virus 2 NOT DETECTED NOT DETECTED Final   Parainfluenza Virus 3 NOT DETECTED NOT DETECTED Final   Parainfluenza Virus 4 NOT DETECTED NOT DETECTED Final   Respiratory Syncytial Virus NOT DETECTED NOT DETECTED Final   Bordetella pertussis NOT DETECTED NOT DETECTED Final   Bordetella Parapertussis NOT DETECTED NOT DETECTED Final   Chlamydophila pneumoniae NOT DETECTED NOT  DETECTED Final   Mycoplasma pneumoniae NOT DETECTED NOT DETECTED Final    Comment: Performed at [facility], [address], [place], [place] [address]  Culture, Respiratory w Gram Stain     Status: None   Collection Time: 07/17/23 11:14 AM   Specimen: Bronchoalveolar Lavage; Respiratory  Result Value Ref Range Status   Specimen Description BRONCHIAL ALVEOLAR LAVAGE  Final   Special Requests Normal  Final   Gram Stain   Final    ABUNDANT WBC PRESENT, PREDOMINANTLY PMN NO ORGANISMS SEEN Performed at [facility], [address] [place], [place] [address]    Culture RARE METHICILLIN RESISTANT STAPHYLOCOCCUS AUREUS  Final   Report Status 07/19/2023 FINAL  Final   Organism ID, Bacteria METHICILLIN RESISTANT STAPHYLOCOCCUS AUREUS  Final      Susceptibility   Methicillin resistant staphylococcus aureus - MIC*    CIPROFLOXACIN 4 RESISTANT Resistant     ERYTHROMYCIN >=8 RESISTANT Resistant     GENTAMICIN <=0.5 SENSITIVE Sensitive     OXACILLIN >=4 RESISTANT Resistant     TETRACYCLINE <=1 SENSITIVE Sensitive     VANCOMYCIN 1 SENSITIVE Sensitive     TRIMETH/SULFA 160 RESISTANT Resistant     CLINDAMYCIN <=0.25 SENSITIVE Sensitive     RIFAMPIN <=0.5 SENSITIVE Sensitive     Inducible Clindamycin NEGATIVE Sensitive  LINEZOLID 2 SENSITIVE Sensitive     * RARE METHICILLIN RESISTANT STAPHYLOCOCCUS AUREUS  Acid Fast Smear (AFB)     Status: None   Collection Time: 07/17/23 11:14 AM   Specimen: Bronchoalveolar Lavage; Respiratory  Result Value Ref Range Status   AFB Specimen Processing Concentration  Final   Acid Fast Smear Negative  Final    Comment: (NOTE) Performed At: [facility] [address] [place], [place] [id number] Jolene Schimke MD WU:[id number]    Source (AFB) BRONCHIAL ALVEOLAR LAVAGE  Final    Comment: Performed at [facility], [address], [place], [place] [address]  Blood Culture ID Panel (Reflexed)     Status: Abnormal   Collection Time:  07/17/23  6:57 PM  Result Value Ref Range Status   Enterococcus faecalis NOT DETECTED NOT DETECTED Final   Enterococcus Faecium NOT DETECTED NOT DETECTED Final   Listeria monocytogenes NOT DETECTED NOT DETECTED Final   Staphylococcus species DETECTED (A) NOT DETECTED Final    Comment: CRITICAL RESULT CALLED TO, READ BACK BY AND VERIFIED WITH: PHARMD JESSICA MILLEN ON 07/17/23 @ 1700 BY DRT    Staphylococcus aureus (BCID) NOT DETECTED NOT DETECTED Final   Staphylococcus epidermidis DETECTED (A) NOT DETECTED Final    Comment: Methicillin (oxacillin) resistant coagulase negative staphylococcus. Possible blood culture contaminant (unless isolated from more than one blood culture draw or clinical case suggests pathogenicity). No antibiotic treatment is indicated for blood  culture contaminants. PHARMD JESSICA MILLEN ON 07/17/23 @ 1700 BY DRT    Staphylococcus lugdunensis NOT DETECTED NOT DETECTED Final   Streptococcus species NOT DETECTED NOT DETECTED Final   Streptococcus agalactiae NOT DETECTED NOT DETECTED Final   Streptococcus pneumoniae NOT DETECTED NOT DETECTED Final   Streptococcus pyogenes NOT DETECTED NOT DETECTED Final   A.calcoaceticus-baumannii NOT DETECTED NOT DETECTED Final   Bacteroides fragilis NOT DETECTED NOT DETECTED Final   Enterobacterales NOT DETECTED NOT DETECTED Final   Enterobacter cloacae complex NOT DETECTED NOT DETECTED Final   Escherichia coli NOT DETECTED NOT DETECTED Final   Klebsiella aerogenes NOT DETECTED NOT DETECTED Final   Klebsiella oxytoca NOT DETECTED NOT DETECTED Final   Klebsiella pneumoniae NOT DETECTED NOT DETECTED Final   Proteus species NOT DETECTED NOT DETECTED Final   Salmonella species NOT DETECTED NOT DETECTED Final   Serratia marcescens NOT DETECTED NOT DETECTED Final   Haemophilus influenzae NOT DETECTED NOT DETECTED Final   Neisseria meningitidis NOT DETECTED NOT DETECTED Final   Pseudomonas aeruginosa NOT DETECTED NOT DETECTED Final    Stenotrophomonas maltophilia NOT DETECTED NOT DETECTED Final   Candida albicans NOT DETECTED NOT DETECTED Final   Candida auris NOT DETECTED NOT DETECTED Final   Candida glabrata NOT DETECTED NOT DETECTED Final   Candida krusei NOT DETECTED NOT DETECTED Final   Candida parapsilosis NOT DETECTED NOT DETECTED Final   Candida tropicalis NOT DETECTED NOT DETECTED Final   Cryptococcus neoformans/gattii NOT DETECTED NOT DETECTED Final   Methicillin resistance mecA/C DETECTED (A) NOT DETECTED Final    Comment: CRITICAL RESULT CALLED TO, READ BACK BY AND VERIFIED WITH: PHARMD JESSICA MILLEN ON 07/17/23 @ 1700 BY DRT Performed at [facility], [address] [place], [place] [address]   C Difficile Quick Screen (NO PCR Reflex)     Status: None   Collection Time: 07/20/23  7:27 AM   Specimen: STOOL  Result Value Ref Range Status   C Diff antigen NEGATIVE NEGATIVE Final   C Diff toxin NEGATIVE NEGATIVE Final  C Diff interpretation No C. difficile detected.  Final    Comment: Performed at [facility], [address] [place], [place] [address]  Culture, blood (Routine X 2) w Reflex to ID Panel     Status: None (Preliminary result)   Collection Time: 07/20/23  9:42 AM   Specimen: BLOOD  Result Value Ref Range Status   Specimen Description BLOOD BLOOD RIGHT ARM  Final   Special Requests   Final    AEROBIC BOTTLE ONLY Blood Culture results may not be optimal due to an inadequate volume of blood received in culture bottles Performed at [facility], [address], [place], [place] [address]    Culture PENDING  Incomplete   Report Status PENDING  Incomplete     Serology:   Imaging: If present, new imagings (plain films, ct scans, and mri) have been personally visualized and interpreted; radiology reports have been reviewed. Decision making incorporated into the Impression / Recommendations.  12/20 cxr FINDINGS: The heart size and mediastinal contours are within normal  limits. Endotracheal and nasogastric tubes are unchanged. Right internal jugular catheter is unchanged. Mildly decreased bibasilar opacities are noted suggesting improving pneumonia or edema. The visualized skeletal structures are unremarkable.   IMPRESSION: Stable support apparatus. Mildly improved bibasilar opacities as noted above.    07/17/23 cxr IMPRESSION: 1. Satisfactory position of endotracheal tube. 2. No acute cardiopulmonary abnormalities.     12/16 head ct 1. No acute intracranial abnormality. 2. Diffuse, moderately severe paranasal sinus disease.   12/16 ct abd pelv chest 1. Bilateral centrilobular micro nodules and tree-in-bud opacities greatest in the bilateral lower lobes compatible with small airway infection/inflammation and/or aspiration. 2. Small amount of pneumomediastinum. 3. Thick-walled nondistended urinary bladder about a Foley catheter. Correlate with urinalysis to exclude cystitis. 4. Bilateral ureteral stents. Bilateral hydronephrosis is similar to 08/05/2021. 5. Similar prominent retroperitoneal lymph nodes and decreased inguinal lymph nodes.       Raymondo Band, MD [facility] [phone] pager    07/20/2023, 12:58 PM

## 2023-07-20 NOTE — Progress Notes (Signed)
Nutrition Follow-up  DOCUMENTATION CODES:  Severe malnutrition in context of social or environmental circumstances  INTERVENTION:  Continue tube feeding via NG tube: Vital 1.5 at 50 ml/h (1200 ml per day) Prosource TF20 60 ml BID Provides 1960 kcal, 121 gm protein, 917 ml free water daily Renal MVI  NUTRITION DIAGNOSIS:  Severe Malnutrition related to social / environmental circumstances (drug abuse) as evidenced by severe muscle depletion, severe fat depletion. - remains applicable  GOAL:  Patient will meet greater than or equal to 90% of their needs  MONITOR:  TF tolerance, I & O's, Vent status, Labs, Weight trends  REASON FOR ASSESSMENT:  Consult Assessment of nutrition requirement/status (CRRT)  ASSESSMENT:  Pt with hx of cervical cancer (completed treatment 2011), bilateral ureteral obstruction (s/p stent placement 10/2022), CKD4, and polysubstance abuse presented to ED with abdominal pain and AMS for several days.  12/16 - presented to ED, intubated, CRRT initiated  Pt resting in bed at the time of assessment. Remains sedated and on vent support. TF infusing at goal rate. Is having a lot of loose stools now and FMS placed 12/19. Will add fiber to add bulk.   Discussed in rounds, unable to wean much from vent. Nephrology plans to stop CRRT and initiate iHD on Monday if pt can tolerate.   MV: 11.2 L/min Temp (24hrs), Avg:98.4 F (36.9 C), Min:97.7 F (36.5 C), Max:98.9 F (37.2 C) MAP (cuff): 75  Propofol: 12.7 ml/hr (335kcal/d at current infusion rate)  Admit weight: 70.3 kg  Current weight: 68.8 kg  ~21% weight loss noted in 2 years. Acutely, -3% in the last 6 months which is not severe.   Intake/Output Summary (Last 24 hours) at 07/20/2023 0959 Last data filed at 07/20/2023 0932 Gross per 24 hour  Intake 4015.71 ml  Output 4690.5 ml  Net -674.79 ml  Net IO Since Admission: 315.72 mL [07/20/23 0959]  Drains/Lines: Temporary HD catheter, triple lumen,  right IJ UOP 75mL x 24 hours Right nephrostomy tube Left nephrostomy tube FMS  Nutritionally Relevant Medications: Scheduled Meds:  docusate  100 mg Per Tube BID   PROSource TF20  60 mL Per Tube BID   insulin aspart  0-9 Units Subcutaneous Q4H   methadone  5 mg Per Tube Q8H   multivitamin  1 tablet Per Tube QHS   pantoprazole IV  40 mg Intravenous QHS   polyethylene glycol  17 g Per Tube Daily   Continuous Infusions:  feeding supplement (VITAL 1.5 CAL) 50 mL/hr at 07/20/23 0900   linezolid (ZYVOX) IV 600 mg (07/20/23 0932)   pencillin G potassium IV 100 mL/hr at 07/20/23 0900   propofol (DIPRIVAN) infusion 30 mcg/kg/min (07/20/23 0900)   sodium PHOSPHATE IVPB (in mmol)     Labs Reviewed: Na 132 Creatinine 1.49 Phosphorus 2.2 CBG ranges from 108-147 mg/dL over the last 24 hours  NUTRITION - FOCUSED PHYSICAL EXAM: Flowsheet Row Most Recent Value  Orbital Region Severe depletion  Upper Arm Region Moderate depletion  Thoracic and Lumbar Region Mild depletion  Buccal Region Severe depletion  Temple Region Severe depletion  Clavicle Bone Region Moderate depletion  Clavicle and Acromion Bone Region Severe depletion  Scapular Bone Region Unable to assess  Dorsal Hand Unable to assess  [mittens]  Patellar Region Moderate depletion  Anterior Thigh Region Severe depletion  Posterior Calf Region Unable to assess  [both calves wrapped - wounds edema]  Edema (RD Assessment) Moderate  [BLE]  Hair Reviewed  [thinning]  Eyes Unable to assess  Mouth Reviewed  Skin Reviewed  Nails Unable to assess  [mittens]    Diet Order:   Diet Order             Diet NPO time specified  Diet effective now                   EDUCATION NEEDS:  Not appropriate for education at this time  Skin:  Skin Assessment: Reviewed RN Assessment Multiple full thickness - bilateral legs  Last BM:  12/19 FMS placed 12/19  Height:  Ht Readings from Last 1 Encounters:  07/16/23 5\' 6"  (1.676 m)     Weight:  Wt Readings from Last 1 Encounters:  07/20/23 66.7 kg    Ideal Body Weight:  59.1 kg  BMI:  Body mass index is 23.73 kg/m.  Estimated Nutritional Needs:  Kcal:  1900-2100 kcal/d Protein:  115-130g/d Fluid:  2L/d    Greig Castilla, RD, LDN Registered Dietitian II Please reach out via secure chat Weekend on-call pager # available in [facility]

## 2023-07-20 NOTE — Plan of Care (Signed)
I have stopped by to see if her mentation has improved multiple times without luck.  She is still requiring a significant amount of sedation due to ventilator asynchrony. Per bedside nursing's report, it is questionable as to whether she will recover at all.  There is nothing else surgical to offer while she is inpatient, though I had hoped to be able to have a conversation with her regarding her course of care and discuss longer-term follow-up.  Please contact urology on-call if she recovers to the point of extubation.  Will otherwise follow along peripherally.  Please call with questions.

## 2023-07-20 NOTE — Procedures (Signed)
Admit: 07/16/2023 LOS: 4  [name] with chronic obstructive uropathy with b/l stents, hx/o cervical CA, CKD4 here with AKI, metabolic acidosis, hyperkalemia, AMS, VDRF, septic shock from ?UTI  Current CRRT Prescription: Start Date: 07/17/23 Catheter: R internal jugular Temp HD cath placed 07/16/23 CCM BFR: 200 Pre Blood Pump: 4K 800 DFR: 2000 4K Replacement Rate: 4K 400 Goal UF: -71mL/h Anticoagulation: fixed dose heparin Clotting: events occurring infrequently, >24h interval time  S: D/w RN and primary MD overnight issues and plan Stable but still not waking up or seemingly ready for extubation P 2.2 Anuric despite PCNs Net 0.7L Neg yesterday Off pressors  O: 12/19 0701 - 12/20 0700 In: 3879.9 [I.V.:839.6; Blood:315; NG/GT:1269.7; IV Piggyback:1355.6] Out: 4588 [Urine:75; Stool:200]  Filed Weights   07/18/23 0416 07/19/23 0412 07/20/23 0500  Weight: 67.7 kg 67.9 kg 66.7 kg    Recent Labs  Lab 07/19/23 0300 07/19/23 1530 07/20/23 0308  NA 133* 132* 132*  K 4.1 4.5 4.3  CL 102 99 101  CO2 25 25 23   GLUCOSE 121* 145* 152*  BUN 15 14 11   CREATININE 1.70* 1.41* 1.49*  CALCIUM 7.8* 7.9* 8.0*  PHOS 1.8* 3.0 2.2*   Recent Labs  Lab 07/16/23 1652 07/16/23 1658 07/19/23 0300 07/19/23 0656 07/19/23 1431 07/20/23 0308  WBC 46.1*   < > 47.3* 50.0*  --  58.2*  NEUTROABS 42.0*  --   --  45.5*  --   --   HGB 10.8*   < > 7.0* 6.4* 8.2* 8.0*  HCT 36.0   < > 23.1* 20.9* 26.7* 26.1*  MCV 95.2   < > 96.3 96.8  --  95.6  PLT 704*   < > 272 229  --  243   < > = values in this interval not displayed.    Scheduled Meds:  sodium chloride   Intravenous Once   Chlorhexidine Gluconate Cloth  6 each Topical Daily   docusate  100 mg Per Tube BID   etomidate  0.3 mg/kg Intravenous Once   feeding supplement (PROSource TF20)  60 mL Per Tube BID   insulin aspart  0-9 Units Subcutaneous Q4H   methadone  5 mg Per Tube Q8H   multivitamin  1 tablet Per Tube QHS   mupirocin ointment  1  Application Nasal BID   mouth rinse  15 mL Mouth Rinse Q2H   pantoprazole (PROTONIX) IV  40 mg Intravenous QHS   polyethylene glycol  17 g Per Tube Daily   sodium chloride flush  5 mL Intracatheter Q8H   Continuous Infusions:   prismasol BGK 4/2.5 800 mL/hr at 07/20/23 0747    prismasol BGK 4/2.5 400 mL/hr at 07/19/23 1840   feeding supplement (VITAL 1.5 CAL) 50 mL/hr at 07/20/23 0900   fentaNYL infusion INTRAVENOUS 175 mcg/hr (07/20/23 0900)   heparin 10,000 units/ 20 mL infusion syringe 750 Units/hr (07/20/23 0900)   linezolid (ZYVOX) IV 600 mg (07/20/23 0932)   norepinephrine (LEVOPHED) Adult infusion Stopped (07/20/23 0411)   pencillin G potassium IV 100 mL/hr at 07/20/23 0900   prismasol BGK 4/2.5 2,000 mL/hr at 07/20/23 0746   propofol (DIPRIVAN) infusion 30 mcg/kg/min (07/20/23 0900)   sodium PHOSPHATE IVPB (in mmol)     PRN Meds:.fentaNYL, heparin, heparin, midazolam, mouth rinse  ABG    Component Value Date/Time   PHART 6.935 (LL) 07/16/2023 2039   PCO2ART 49.6 (H) 07/16/2023 2039   PO2ART 497 (H) 07/16/2023 2039   HCO3 23.7 07/17/2023 1016   TCO2 25  07/17/2023 1016   ACIDBASEDEF 3.0 (H) 07/17/2023 1016   O2SAT 79 07/17/2023 1016    A Dialysis dependent anuric AKI on CKD4 w/ hx/o obstructive uropathy; s/p removal of ureteral stents and b/l PCNs 07/17/23 with IR; no evidence of GFR recovery to date Septic shock likely from UTI with b/l ureteral stents not exchanged on schedule; now off pressors Strep bacteremia RCID following Hyperkalemia, resolved Metabolic acidosis, resolved Anemia Hypophsophatemia from CRRT, repleted already today  P Cont 4K and flow settings Net neg 53mL/h UF as BP permits, not at cost of inc NE Cont fixed dose heparin Again, reassess for stopping CRRT tomorrow esp after seeing if she wakes up and can ween from vent  Arita Miss, MD  [employer]

## 2023-07-20 NOTE — Progress Notes (Signed)
Pt transported on the ventilator from 33M 4 to CT2  and back without complication. RT, RN and transport accompanied patient.

## 2023-07-20 NOTE — Progress Notes (Signed)
eLink Physician-Brief Progress Note Patient Name: Marie Brewer DOB: [birth year] MRN: [id number]   Date of Service  07/20/2023  HPI/Events of Note  Temp increasing to 100 in cellulitis, MRSA pneumonia LFTs mildly elevated BP stable off pressors  eICU Interventions  PRN tylenol added x 2 doses max Blood cultures obtained ID following. No change in abx for now     Intervention Category Intermediate Interventions: Infection - evaluation and management  Daniyla Pfahler [name] 07/20/2023, 11:48 PM

## 2023-07-20 NOTE — Progress Notes (Signed)
NAME:  Marie Brewer, MRN:  [id number], DOB:  05-16-1981, LOS: 4 ADMISSION DATE:  07/16/2023, CONSULTATION DATE:  07/16/23 REFERRING MD:  [facility]-EDP, CHIEF COMPLAINT:  Sepsis, presumed urosepsis   History of present illness   42 year old woman who presented to [facility] ED 12/16 for unresponsiveness. PMHx significant for CKD stage 4, bilateral ureteral obstruction (s/p stent placement 10/2022), cervical CA stage IIB (s/p chemotherapy/XRT, followed at [facility]), polysubstance abuse (current heroin use via inhalation).   Patient presented to [facility] ED with decreased responsiveness x 3 days, per patient's husband (at bedside). Has not been feeling well for about a month but is often hesitant to come to the ED and was adamantly refusing going to the hospital. PO intake has been poor, has been only drinking [facility] and not eating much. No reported fevers at home, but chills/general malaise. Intermittent nausea, no vomiting/diarrhea. Per husband, has complained of abdominal pain/pain with urination. Of note, has bilateral ureteral stents placed in 03/2022; she was supposed to follow up for stent exchange 01/2023 but did not present as scheduled. Additionally, husband notes she continues to use heroin via inhalation, denies injection.   On ED arrival, patient was afebrile, HR 111, BP 126/80, SpO2 100%. She was initially somnolent with GCS 12 but intermittently agitated/not following command, thrashing and causing risk to herself/staff. Ultimately patient was intubated for her safety/airway protection. Labs were notable for WBC 46.1, Hgb 10.8, Plt 704 (suspect concentrated), Na 130, K 5.7, CO2 < 7, BUN 158/Cr 12.13 (baseline ~4). Transaminases/Tbili WNL, Alk Phos mildly elevated. Lipase 133. TSH 4.046. LA 6.1 > 0.8 after IV fluids. CK 198. Ammonia 79, Ethanol < 10, APAP/salicylates negative. UDS pending. VBG 6.974/pCO2 23.4/bicarb 5.4. PCT 29.14, MRSA PCR positive (nares). BCx pending, cefepime/vanc initiated. CT  Chest/A/P demonstrated bilateral centrilobular micronodules (tree-in-bud) greatest in bilateral lower lobes, small amount pneumomediastinum (history of esophageal perf), thick-walled nondistended urinary bladder with Foley, bilateral hydronephrosis with bilateral ureteral stents.   PCCM consulted for ICU admission. Nephro consulted for ARF and emergent CRRT initiation.  Past Medical History  CKD stage IV Bilateral ureteral obstruction status post stent placement April 2024 Cervical cancer stage IIb s/p chemotherapy and radiation Heroin use  Significant Hospital Events   12/16 - Presented to [facility] ED with unresponsiveness, decreased PO intake. Unresponsiveness with intermittent agitation requiring intubation. Found to be in acute renal failure with Cr 12 (baseline ~4), BUN > 100. WBC 46. Briefly required Levophed prior to intubation. Broad-spectrum antibiotic started (cefepime/vanc). RIJ Trialysis catheter placed. CRRT started per Nephro. 12/17- IR placed bilateral percutaneous nephrostomy tubes and removed bilateral ureteral stents  Interim history/subjective:  Tachycardic to 120s with increased respiratory rate.  She was placed back on Levophed at low-dose with CRRT.  Afebrile does have body temperature control on CRRT. Flexi-Seal placed yesterday afternoon with increased stool burden with type VII stools.  Overnight she had about 200 cc stool.  Objective   Blood pressure 108/66, pulse (!) 114, temperature 98.6 F (37 C), temperature source Oral, resp. rate (!) 23, height 5\' 6"  (1.676 m), weight 66.7 kg, SpO2 96%.    Vent Mode: PRVC FiO2 (%):  [40 %] 40 % Set Rate:  [24 bmp] 24 bmp Vt Set:  [470 mL] 470 mL PEEP:  [5 cmH20] 5 cmH20 Plateau Pressure:  [19 cmH20] 19 cmH20   Intake/Output Summary (Last 24 hours) at 07/20/2023 0616 Last data filed at 07/20/2023 0600 Gross per 24 hour  Intake 3873.16 ml  Output 4494.3 ml  Net -621.14 ml  Urine output 55 cc 12/19  Filed Weights    07/18/23 0416 07/19/23 0412 07/20/23 0500  Weight: 67.7 kg 67.9 kg 66.7 kg    Examination: General:  In bed on vent, synchronous with vent HENT: NCAT ETT in place PULM: CTA B, vent supported breathing CV: RRR, no mgr GI: BS+, soft, nontender MSK: normal bulk and tone, dressing in place to bilateral lower extremities Neuro: sedated on vent  Labs   CBC: WBC from 50-58 Hemoglobin from 6.4-8 Platelets at 243  Basic Metabolic Panel: Na 132 Bicarb 23 AG 8 BUN 15 K 4.1 Creatinine at 1.49.  Baseline appears to be around 4 180 cc urine out 12/18  12/16 mrsa nares pcr positive 12/17 ucx multiple species 12/17 respiratory culture (BAL) with abundant WBC, MRSA sensitive to linezolid.  AFB negative 12/16 bcx 1 of 2 set with group a strep, pansensitive  CXR 12/20: No widened mediastinum. No consolidation or edema seen.  CT abd/pelvis 12/16 IMPRESSION: 1. Bilateral centrilobular micro nodules and tree-in-bud opacities greatest in the bilateral lower lobes compatible with small airway infection/inflammation and/or aspiration. 2. Small amount of pneumomediastinum. 3. Thick-walled nondistended urinary bladder about a Foley catheter. Correlate with urinalysis to exclude cystitis. 4. Bilateral ureteral stents. Bilateral hydronephrosis is similar to 08/05/2021. 5. Similar prominent retroperitoneal lymph nodes and decreased inguinal lymph nodes.  Resolved Hospital Problem list   Severe metabolic acidosis Uremia Hyperkalemia  Assessment & Plan:  42 y.o. year old female with pmh of cervical cancer s/p chemoradiation complicated by ureteral stenosis with stent placement, heroin abuse who present altered mental status and admitted to ICU 12/16 for septic shock.   Septic Shock Group A strep bacteremia, Bilateral lower extremity wounds Rhinovirus History of bilateral ureteral obstruction Leukocytosis Blood culture grew MRSE and Group A strep, likely from chronic wounds on lower  extremities.  Respiratory tracheal aspirate grew rare MRSA sensitive to linezolid, viral panel with rhinovirus. She was able to have stents removed 12/17 and nephrostomy tubes placed 12/17 with IR. Antibiotics were switched from cefepime linezolid to linezolid and penicillin G yesterday.  Leukocytosis continued to increase despite targeted antibiotics.  She did have multiple episodes of type VII stool yesterday and is at risk for C. difficile with broad-spectrum antibiotics.  -continue penicillin G and linezolid -Tracheal aspirate fungal culture pending -strict I and Os -Low concern for endocarditis with this bacteria, getting TTE for hemodynamics -C diff PCR, enteric precautions  Acute renal failure CKD stage IV She has 55cc urine output yesterday. Likely had ATN with hypoperfusion leading to slow recovery.  -continue CRRT per nephrology, may try to pull fluid if blood pressure tolerates -trend RFP this PM -strict I and Os -avoid nephrotoxic agents  Acute hypoxic respiratory failure Concern for aspiration pneumonitis Intubated for airway protection. Decreasing sedation today. Increased work of breathing when switched to pressure support  -Full vent support (4-8cc/kg IBWW) -wean FiO2 for O2 sat>90% -Daily WUA/SBT once appropriate from a mental status standpoint  -VAP bundle -PAD protocol: Propofol and fentanyl RASS -1 to -2 -triglyceride while on propofol  History of polysubstance abuse with current heroin use, inhalation Husband endorses heroin use. High risk for withdrawal symptoms.   -UDS positive for cocaine -start methadone 5 mg TID  Diabetes Blood sugars 70s-100s today.  -SSI  Small volume pneumomediastinum Small amount of pneumomediastinum seen on CT chest 12/16. Has history of esophageal perforation and inhales heroin. Repeat CXR 12/20 without abnormabilities.  - Monitor closely for now  History of cervical cancer Stage  IIb, status post chemoradiation diagnosed in  2011.  Followed with Dr. Bertis Ruddy at [facility], last seen 3/23 with recommendations for CT abd/pelvis with 1/23 CT showing lymphadenopathy. CT 07/16/23 with similar prominent retroperitoneal and decreased inguinal lymph nodes.  -continue outpatient follow-up  Best practice:  Diet: NPO, tube feeds Pain/Anxiety/Delirium protocol (if indicated): propofol, fentanyl DVT prophylaxis: Heparin GI prophylaxis: PPI Glucose control: SSI Code Status: Full Family Communication: husband at bedside  Uziel Covault M. Alana Dayton, D.O.  Internal Medicine Resident, PGY-3 [facility]  Pager: [phone] 6:16 AM, 07/20/2023

## 2023-07-20 NOTE — Plan of Care (Signed)
  Problem: Nutritional: Goal: Maintenance of adequate nutrition will improve Outcome: Progressing   Problem: Skin Integrity: Goal: Risk for impaired skin integrity will decrease Outcome: Progressing   Problem: Activity: Goal: Risk for activity intolerance will decrease Outcome: Progressing   Problem: Elimination: Goal: Will not experience complications related to bowel motility Outcome: Progressing

## 2023-07-21 ENCOUNTER — Inpatient Hospital Stay (HOSPITAL_COMMUNITY): Payer: Commercial Managed Care - HMO

## 2023-07-21 DIAGNOSIS — B950 Streptococcus, group A, as the cause of diseases classified elsewhere: Secondary | ICD-10-CM | POA: Diagnosis not present

## 2023-07-21 DIAGNOSIS — R6521 Severe sepsis with septic shock: Secondary | ICD-10-CM | POA: Diagnosis not present

## 2023-07-21 DIAGNOSIS — I776 Arteritis, unspecified: Secondary | ICD-10-CM | POA: Diagnosis not present

## 2023-07-21 DIAGNOSIS — A400 Sepsis due to streptococcus, group A: Secondary | ICD-10-CM | POA: Diagnosis not present

## 2023-07-21 LAB — GLUCOSE, CAPILLARY
Glucose-Capillary: 122 mg/dL — ABNORMAL HIGH (ref 70–99)
Glucose-Capillary: 114 mg/dL — ABNORMAL HIGH (ref 70–99)
Glucose-Capillary: 122 mg/dL — ABNORMAL HIGH (ref 70–99)
Glucose-Capillary: 139 mg/dL — ABNORMAL HIGH (ref 70–99)
Glucose-Capillary: 143 mg/dL — ABNORMAL HIGH (ref 70–99)
Glucose-Capillary: 136 mg/dL — ABNORMAL HIGH (ref 70–99)

## 2023-07-21 LAB — CBC
HCT: 26 % — ABNORMAL LOW (ref 36.0–46.0)
Hemoglobin: 7.8 g/dL — ABNORMAL LOW (ref 12.0–15.0)
MCH: 29.8 pg (ref 26.0–34.0)
MCHC: 30 g/dL (ref 30.0–36.0)
MCV: 99.2 fL (ref 80.0–100.0)
Platelets: 271 10*3/uL (ref 150–400)
RBC: 2.62 MIL/uL — ABNORMAL LOW (ref 3.87–5.11)
RDW: 17.3 % — ABNORMAL HIGH (ref 11.5–15.5)
WBC: 62.8 10*3/uL (ref 4.0–10.5)
nRBC: 1.6 % — ABNORMAL HIGH (ref 0.0–0.2)

## 2023-07-21 LAB — POTASSIUM
Potassium: 5.7 mmol/L — ABNORMAL HIGH (ref 3.5–5.1)
Potassium: 6.1 mmol/L — ABNORMAL HIGH (ref 3.5–5.1)

## 2023-07-21 LAB — RENAL FUNCTION PANEL
Albumin: 1.6 g/dL — ABNORMAL LOW (ref 3.5–5.0)
Albumin: 1.6 g/dL — ABNORMAL LOW (ref 3.5–5.0)
Anion gap: 13 (ref 5–15)
Anion gap: 13 (ref 5–15)
BUN: 42 mg/dL — ABNORMAL HIGH (ref 6–20)
BUN: 39 mg/dL — ABNORMAL HIGH (ref 6–20)
CO2: 25 mmol/L (ref 22–32)
CO2: 21 mmol/L — ABNORMAL LOW (ref 22–32)
Calcium: 8.8 mg/dL — ABNORMAL LOW (ref 8.9–10.3)
Calcium: 8.5 mg/dL — ABNORMAL LOW (ref 8.9–10.3)
Chloride: 96 mmol/L — ABNORMAL LOW (ref 98–111)
Chloride: 99 mmol/L (ref 98–111)
Creatinine, Ser: 3.42 mg/dL — ABNORMAL HIGH (ref 0.44–1.00)
Creatinine, Ser: 2.78 mg/dL — ABNORMAL HIGH (ref 0.44–1.00)
GFR, Estimated: 16 mL/min — ABNORMAL LOW (ref >60)
GFR, Estimated: 21 mL/min — ABNORMAL LOW (ref >60)
Glucose, Bld: 150 mg/dL — ABNORMAL HIGH (ref 70–99)
Glucose, Bld: 149 mg/dL — ABNORMAL HIGH (ref 70–99)
Phosphorus: 7.9 mg/dL — ABNORMAL HIGH (ref 2.5–4.6)
Phosphorus: 5.9 mg/dL — ABNORMAL HIGH (ref 2.5–4.6)
Potassium: 5.7 mmol/L — ABNORMAL HIGH (ref 3.5–5.1)
Potassium: 6.1 mmol/L — ABNORMAL HIGH (ref 3.5–5.1)
Sodium: 134 mmol/L — ABNORMAL LOW (ref 135–145)
Sodium: 133 mmol/L — ABNORMAL LOW (ref 135–145)

## 2023-07-21 LAB — COMPREHENSIVE METABOLIC PANEL
ALT: 65 U/L — ABNORMAL HIGH (ref 0–44)
AST: 120 U/L — ABNORMAL HIGH (ref 15–41)
Albumin: 1.6 g/dL — ABNORMAL LOW (ref 3.5–5.0)
Alkaline Phosphatase: 122 U/L (ref 38–126)
Anion gap: 13 (ref 5–15)
BUN: 31 mg/dL — ABNORMAL HIGH (ref 6–20)
CO2: 20 mmol/L — ABNORMAL LOW (ref 22–32)
Calcium: 8.5 mg/dL — ABNORMAL LOW (ref 8.9–10.3)
Chloride: 97 mmol/L — ABNORMAL LOW (ref 98–111)
Creatinine, Ser: 2.9 mg/dL — ABNORMAL HIGH (ref 0.44–1.00)
GFR, Estimated: 20 mL/min — ABNORMAL LOW (ref >60)
Glucose, Bld: 133 mg/dL — ABNORMAL HIGH (ref 70–99)
Potassium: 6.7 mmol/L (ref 3.5–5.1)
Sodium: 130 mmol/L — ABNORMAL LOW (ref 135–145)
Total Bilirubin: 0.7 mg/dL (ref <1.2)
Total Protein: 8.7 g/dL — ABNORMAL HIGH (ref 6.5–8.1)

## 2023-07-21 LAB — POCT I-STAT 7, (LYTES, BLD GAS, ICA,H+H)
Acid-base deficit: 4 mmol/L — ABNORMAL HIGH (ref 0.0–2.0)
Bicarbonate: 23.6 mmol/L (ref 20.0–28.0)
Calcium, Ion: 1.15 mmol/L (ref 1.15–1.40)
HCT: 30 % — ABNORMAL LOW (ref 36.0–46.0)
Hemoglobin: 10.2 g/dL — ABNORMAL LOW (ref 12.0–15.0)
O2 Saturation: 93 %
Patient temperature: 100
Potassium: 6.5 mmol/L (ref 3.5–5.1)
Sodium: 132 mmol/L — ABNORMAL LOW (ref 135–145)
TCO2: 25 mmol/L (ref 22–32)
pCO2 arterial: 57.6 mm[Hg] — ABNORMAL HIGH (ref 32–48)
pH, Arterial: 7.225 — ABNORMAL LOW (ref 7.35–7.45)
pO2, Arterial: 86 mm[Hg] (ref 83–108)

## 2023-07-21 LAB — HEPATIC FUNCTION PANEL
ALT: 161 U/L — ABNORMAL HIGH (ref 0–44)
AST: 451 U/L — ABNORMAL HIGH (ref 15–41)
Albumin: 1.6 g/dL — ABNORMAL LOW (ref 3.5–5.0)
Alkaline Phosphatase: 123 U/L (ref 38–126)
Bilirubin, Direct: <0.1 mg/dL (ref 0.0–0.2)
Total Bilirubin: 0.7 mg/dL (ref <1.2)
Total Protein: 8.6 g/dL — ABNORMAL HIGH (ref 6.5–8.1)

## 2023-07-21 LAB — CULTURE, BLOOD (ROUTINE X 2): Culture: NO GROWTH

## 2023-07-21 LAB — MAGNESIUM: Magnesium: 2.8 mg/dL — ABNORMAL HIGH (ref 1.7–2.4)

## 2023-07-21 LAB — LACTIC ACID, PLASMA: Lactic Acid, Venous: 1.9 mmol/L (ref 0.5–1.9)

## 2023-07-21 LAB — APTT: aPTT: 38 s — ABNORMAL HIGH (ref 24–36)

## 2023-07-21 MED ORDER — PENICILLIN G POTASSIUM 5000000 UNITS IJ SOLR
2.0000 10*6.[IU] | INTRAVENOUS | Status: DC
  Administered 2023-07-21: 2 10*6.[IU] via INTRAVENOUS
  Filled 2023-07-21 (×4): qty 2

## 2023-07-21 MED ORDER — ALBUTEROL SULFATE (2.5 MG/3ML) 0.083% IN NEBU
10.0000 mg | INHALATION_SOLUTION | Freq: Once | RESPIRATORY_TRACT | Status: AC
  Administered 2023-07-21: 10 mg via RESPIRATORY_TRACT
  Filled 2023-07-21: qty 12

## 2023-07-21 MED ORDER — PENICILLIN G POT IN DEXTROSE 60000 UNIT/ML IV SOLN
3.0000 10*6.[IU] | INTRAVENOUS | Status: DC
  Administered 2023-07-21 – 2023-07-25 (×24): 3 10*6.[IU] via INTRAVENOUS
  Filled 2023-07-21 (×26): qty 50

## 2023-07-21 MED ORDER — SODIUM BICARBONATE 8.4 % IV SOLN
50.0000 meq | Freq: Once | INTRAVENOUS | Status: AC
  Administered 2023-07-21: 50 meq via INTRAVENOUS
  Filled 2023-07-21: qty 50

## 2023-07-21 MED ORDER — LACTATED RINGERS IV BOLUS
1000.0000 mL | Freq: Once | INTRAVENOUS | Status: AC
  Administered 2023-07-21: 1000 mL via INTRAVENOUS

## 2023-07-21 MED ORDER — SODIUM ZIRCONIUM CYCLOSILICATE 10 G PO PACK
10.0000 g | PACK | Freq: Every day | ORAL | Status: AC
  Filled 2023-07-21: qty 1

## 2023-07-21 MED ORDER — CALCIUM GLUCONATE-NACL 1-0.675 GM/50ML-% IV SOLN
1.0000 g | Freq: Once | INTRAVENOUS | Status: AC
  Administered 2023-07-21: 1000 mg via INTRAVENOUS
  Filled 2023-07-21: qty 50

## 2023-07-21 NOTE — Progress Notes (Signed)
Pt transported from 2M04 to CT and back without any complications.

## 2023-07-21 NOTE — Plan of Care (Signed)
  Problem: Nutritional: Goal: Maintenance of adequate nutrition will improve Outcome: Progressing   Problem: Skin Integrity: Goal: Risk for impaired skin integrity will decrease Outcome: Progressing   Problem: Safety: Goal: Ability to remain free from injury will improve Outcome: Progressing

## 2023-07-21 NOTE — Progress Notes (Signed)
NAME:  Marie Brewer, MRN:  [id number], DOB:  10/16/1980, LOS: 5 ADMISSION DATE:  07/16/2023, CONSULTATION DATE:  07/16/23 REFERRING MD:  Countryman-EDP, CHIEF COMPLAINT:  Sepsis, presumed urosepsis   History of present illness   42 year old woman who presented to [facility] ED 12/16 for unresponsiveness. PMHx significant for CKD stage 4, bilateral ureteral obstruction (s/p stent placement 10/2022), cervical CA stage IIB (s/p chemotherapy/XRT, followed at [facility]), polysubstance abuse (current heroin use via inhalation).   Patient presented to [facility] ED with decreased responsiveness x 3 days, per patient's husband (at bedside). Has not been feeling well for about a month but is often hesitant to come to the ED and was adamantly refusing going to the hospital. PO intake has been poor, has been only drinking [facility] and not eating much. No reported fevers at home, but chills/general malaise. Intermittent nausea, no vomiting/diarrhea. Per husband, has complained of abdominal pain/pain with urination. Of note, has bilateral ureteral stents placed in 03/2022; she was supposed to follow up for stent exchange 01/2023 but did not present as scheduled. Additionally, husband notes she continues to use heroin via inhalation, denies injection.   On ED arrival, patient was afebrile, HR 111, BP 126/80, SpO2 100%. She was initially somnolent with GCS 12 but intermittently agitated/not following command, thrashing and causing risk to herself/staff. Ultimately patient was intubated for her safety/airway protection. Labs were notable for WBC 46.1, Hgb 10.8, Plt 704 (suspect concentrated), Na 130, K 5.7, CO2 < 7, BUN 158/Cr 12.13 (baseline ~4). Transaminases/Tbili WNL, Alk Phos mildly elevated. Lipase 133. TSH 4.046. LA 6.1 > 0.8 after IV fluids. CK 198. Ammonia 79, Ethanol < 10, APAP/salicylates negative. UDS pending. VBG 6.974/pCO2 23.4/bicarb 5.4. PCT 29.14, MRSA PCR positive (nares). BCx pending, cefepime/vanc initiated. CT  Chest/A/P demonstrated bilateral centrilobular micronodules (tree-in-bud) greatest in bilateral lower lobes, small amount pneumomediastinum (history of esophageal perf), thick-walled nondistended urinary bladder with Foley, bilateral hydronephrosis with bilateral ureteral stents.   PCCM consulted for ICU admission. Nephro consulted for ARF and emergent CRRT initiation.  Past Medical History  CKD stage IV Bilateral ureteral obstruction status post stent placement April 2024 Cervical cancer stage IIb s/p chemotherapy and radiation Heroin use  Significant Hospital Events   12/16 - Presented to [facility] ED with unresponsiveness, decreased PO intake. Unresponsiveness with intermittent agitation requiring intubation. Found to be in acute renal failure with Cr 12 (baseline ~4), BUN > 100. WBC 46. Briefly required Levophed prior to intubation. Broad-spectrum antibiotic started (cefepime/vanc). RIJ Trialysis catheter placed. CRRT started per Nephro. 12/17- IR placed bilateral percutaneous nephrostomy tubes and removed bilateral ureteral stents  Interim history/subjective:  CRRT stopped yesterday. Now with worsening electrolyte disturbances. Still on low dose vasopressors. Went down for head ct overnight.   Objective   Blood pressure 99/68, pulse (!) 113, temperature 99.4 F (37.4 C), temperature source Axillary, resp. rate (!) 32, height 5\' 6"  (1.676 m), weight 65.2 kg, SpO2 97%.    Vent Mode: PRVC FiO2 (%):  [40 %] 40 % Set Rate:  [24 bmp-32 bmp] 32 bmp Vt Set:  [470 mL] 470 mL PEEP:  [5 cmH20] 5 cmH20 Plateau Pressure:  [20 cmH20-21 cmH20] 21 cmH20   Intake/Output Summary (Last 24 hours) at 07/21/2023 0912 Last data filed at 07/21/2023 0600 Gross per 24 hour  Intake 3072.33 ml  Output 2617 ml  Net 455.33 ml    Filed Weights   07/19/23 0412 07/20/23 0500 07/21/23 0500  Weight: 67.9 kg 66.7 kg 65.2 kg    Examination:  Gen:      Intubated, sedated, acutely ill appearing, cachectic HEENT:   ETT to vent, temporal wasting Lungs:    sounds of mechanical ventilation auscultated no wheeze CV:         tachycardic, regular Abd:      + bowel sounds; soft, non-tender; no palpable masses, no distension Ext:    Bilatearl lower extremity wounds in dressing Skin:      lower extremity cellulitis Neuro:  sedated, not responsive to voice, withdraws to noxious stimuli.    Labs/imaging reviewed  Na 134 K 5.7 [place]cl[place] 150 Cr 3.42 Phos 7.9 Serum bicarb 25 AST 451 ALT 161 WBC 62.8  Repeat blood cx sent 12/21 ngtd C diff negative  CT A/P shows worsening pulmonary disease, bilateral nephrostomoy tubes without hydronephrosis. No abscess (non con study) CT Head - chronic sinus disease, no acute intracranial process  Resolved Hospital Problem list   Severe metabolic acidosis Uremia Hyperkalemia  Assessment & Plan:  42 y.o. year old female with pmh of cervical cancer s/p chemoradiation complicated by ureteral stenosis with stent placement, heroin abuse who present altered mental status and admitted to ICU 12/16 for septic shock.   Septic Shock Group A strep bacteremia, Bilateral lower extremity wounds Rhinovirus/MRSA pneumonia History of bilateral ureteral obstruction Leukocytosis -continue penicillin G and linezolid -strict I and Os -CT A/P obtained as well as repeat blood cultures negative due to persistent shock and leukocytosis. Both negative thus far. C. Diff testing also negative.  - appreciate ID assistance  Oliguric Acute renal failure, dependent on RRT CKD stage IV -resume CRRT per nephrology - did not tolerate break of RRT.  may try to pull fluid if blood pressure tolerates -trend RFP this PM -strict I and Os -avoid nephrotoxic agents  Acute hypoxic respiratory failure Concern for aspiration pneumonitis Intubated for airway protection. Decreasing sedation today. Increased work of breathing when switched to pressure support  -Full vent support (4-8cc/kg IBWW) -wean FiO2  for O2 sat>90% -Daily WUA/SBT once appropriate from a mental status standpoint  -VAP bundle -PAD protocol: Propofol and fentanyl RASS -1 to -2 -triglyceride while on propofol  History of polysubstance abuse with current heroin use, inhalation Husband endorses heroin use. High risk for withdrawal symptoms.  -UDS positive for cocaine -continue methadone 5 mg TID, will try to lighten sedation today to move towards extubation  Diabetes - cbgs at goal  Small volume pneumomediastinum Small amount of pneumomediastinum seen on CT chest 12/16. Has history of esophageal perforation and inhales heroin. Repeat CXR 12/20 without abnormabilities. - Monitor closely for now, repeat chest xray if clinically indicated  History of cervical cancer Stage IIb, status post chemoradiation diagnosed in 2011.  Followed with Dr. Bertis Ruddy at [facility], last seen 3/23 with recommendations for CT abd/pelvis with 1/23 CT showing lymphadenopathy. CT 07/16/23 with similar prominent retroperitoneal and decreased inguinal lymph nodes.  -continue outpatient follow-up  Best practice:  Diet: NPO, tube feeds Pain/Anxiety/Delirium protocol (if indicated): propofol, fentanyl DVT prophylaxis: Heparin GI prophylaxis: PPI Glucose control: SSI Code Status: Full Family Communication: husband at bedside, updated 07/20/23  The patient is critically ill due to shock, respiratory failure.  Critical care was necessary to treat or prevent imminent or life-threatening deterioration.  Critical care was time spent personally by me on the following activities: development of treatment plan with patient and/or surrogate as well as nursing, discussions with consultants, evaluation of patient's response to treatment, examination of patient, obtaining history from patient or surrogate, ordering and performing treatments and  interventions, ordering and review of laboratory studies, ordering and review of radiographic studies, pulse oximetry,  re-evaluation of patient's condition and participation in multidisciplinary rounds.   Critical Care Time devoted to patient care services described in this note is 40 minutes. This time reflects time of care of this signee Charlott Holler . This critical care time does not reflect separately billable procedures or procedure time, teaching time or supervisory time of PA/NP/Med student/Med Resident etc but could involve care discussion time.       Charlott Holler [place] [facility] 07/21/2023 9:12 AM  Pager: see AMION  If no response to pager , please call critical care on call (see AMION) until 7pm After 7:00 pm call Elink

## 2023-07-21 NOTE — Plan of Care (Signed)
  Problem: Nutrition: Goal: Adequate nutrition will be maintained Outcome: Progressing  Problem: Clinical Measurements: Goal: Will remain free from infection Outcome: Not Progressing Goal: Diagnostic test results will improve Outcome: Not Progressing

## 2023-07-21 NOTE — Procedures (Signed)
Admit: 07/16/2023 LOS: 5  Marie Brewer with chronic obstructive uropathy with b/l stents, hx/o cervical CA, CKD4 here with AKI, metabolic acidosis, hyperkalemia, AMS, VDRF, septic shock from ?UTI  Current CRRT Prescription: Start Date: 07/17/23 Catheter: R internal jugular Temp HD cath placed 07/16/23 CCM BFR: 200-250 Pre Blood Pump: 4K 800 DFR: 2000 4K Replacement Rate: 4K 400 Goal UF: -52mL/h Anticoagulation: fixed dose heparin Clotting: events occurring infrequently, >24h interval time  S: CRRT held after head CT yesterday Then has developed hyperkalemia, still tenous hemodyanics D/w RN and primary MD overnight issues and plan Anuric K 6.5 --> 5.7, P 7.9 Net 0.3L positive yesterday  O: 12/20 0701 - 12/21 0700 In: 3282.7 [I.V.:850.8; NG/GT:1577.5; IV Piggyback:814.3] Out: 3014 [Urine:80; Emesis/NG output:20; Stool:1250]  Filed Weights   07/19/23 0412 07/20/23 0500 07/21/23 0500  Weight: 67.9 kg 66.7 kg 65.2 kg    Recent Labs  Lab 07/20/23 0308 07/20/23 1549 07/21/23 0028 07/21/23 0039 07/21/23 0442  NA 132* 130* 130* 132* 134*  K 4.3 4.5 6.7* 6.5* 5.7*  5.7*  CL 101 99 97*  --  96*  CO2 23 24 20*  --  25  GLUCOSE 152* 141* 133*  --  150*  BUN 11 15 31*  --  42*  CREATININE 1.49* 1.62* 2.90*  --  3.42*  CALCIUM 8.0* 8.1* 8.5*  --  8.8*  PHOS 2.2* 5.9*  --   --  7.9*   Recent Labs  Lab 07/16/23 1652 07/16/23 1658 07/19/23 0656 07/19/23 1431 07/20/23 0308 07/21/23 0039 07/21/23 0442  WBC 46.1*   < > 50.0*  --  58.2*  --  62.8*  NEUTROABS 42.0*  --  45.5*  --   --   --   --   HGB 10.8*   < > 6.4*   < > 8.0* 10.2* 7.8*  HCT 36.0   < > 20.9*   < > 26.1* 30.0* 26.0*  MCV 95.2   < > 96.8  --  95.6  --  99.2  PLT 704*   < > 229  --  243  --  271   < > = values in this interval not displayed.    Scheduled Meds:  sodium chloride   Intravenous Once   Chlorhexidine Gluconate Cloth  6 each Topical Daily   docusate  100 mg Per Tube BID   etomidate  0.3 mg/kg  Intravenous Once   feeding supplement (PROSource TF20)  60 mL Per Tube BID   fiber supplement (BANATROL TF)  60 mL Per Tube BID   insulin aspart  0-9 Units Subcutaneous Q4H   linezolid  600 mg Per Tube Q12H   methadone  5 mg Per Tube Q8H   multivitamin  1 tablet Per Tube QHS   mupirocin ointment  1 Application Nasal BID   mouth rinse  15 mL Mouth Rinse Q2H   pantoprazole (PROTONIX) IV  40 mg Intravenous QHS   polyethylene glycol  17 g Per Tube Daily   sodium chloride flush  5 mL Intracatheter Q8H   sodium zirconium cyclosilicate  10 g Per Tube Daily   Continuous Infusions:   prismasol BGK 4/2.5 800 mL/hr at 07/20/23 0747    prismasol BGK 4/2.5 400 mL/hr at 07/19/23 1840   feeding supplement (VITAL 1.5 CAL) 50 mL/hr at 07/21/23 0600   fentaNYL infusion INTRAVENOUS 200 mcg/hr (07/21/23 0600)   heparin 10,000 units/ 20 mL infusion syringe 750 Units/hr (07/20/23 1426)   norepinephrine (LEVOPHED) Adult infusion Stopped (07/20/23  0411)   pencillin G potassium IV     prismasol BGK 4/2.5 2,000 mL/hr at 07/20/23 0915   propofol (DIPRIVAN) infusion 50 mcg/kg/min (07/21/23 0812)   PRN Meds:.acetaminophen, fentaNYL, heparin, heparin, midazolam, mouth rinse  ABG    Component Value Date/Time   PHART 7.225 (L) 07/21/2023 0039   PCO2ART 57.6 (H) 07/21/2023 0039   PO2ART 86 07/21/2023 0039   HCO3 23.6 07/21/2023 0039   TCO2 25 07/21/2023 0039   ACIDBASEDEF 4.0 (H) 07/21/2023 0039   O2SAT 93 07/21/2023 0039    A Dialysis dependent anuric AKI on CKD4 w/ hx/o obstructive uropathy; s/p removal of ureteral stents and b/l PCNs 07/17/23 with IR; no evidence of GFR recovery to date Septic shock likely from UTI with b/l ureteral stents not exchanged on schedule; now off pressors Strep bacteremia RCID following Hyperkalemia, recurrent quickly after holding CRRT 07/20/23 Metabolic acidosis, resolved Anemia Hypophsophatemia from CRRT, rebounded but will CTM MRSA and Rhinovirus PNA  P Resume and  cont CRRT: cont 4K and flow settings Net neg 53mL/h UF as BP permits, not at cost of inc NE Cont fixed dose heparin Progress here seems glacial.  No rush to come off CRRT. Not sure she will do well with iHD  Arita Miss, MD  [facility]

## 2023-07-21 NOTE — Progress Notes (Addendum)
eLink Physician-Brief Progress Note Patient Name: Marie Brewer DOB: 06-05-1981 MRN: [id number]   Date of Service  07/21/2023  HPI/Events of Note  Bedside RN reports neuro change including pupil change, worsening respiratory status and decreased responsiveness including loss of gag and corneal reflex  eICU Interventions  Ground team contacted for evaluation   07/21/23 1:20 AM Labs reviewed. K 6.5. ABG 7.225/57. Hyperkalemia protocol including lokelma, bicarb, calcium and albuterol ordered. CT head with no acute abnormalities  07/21/23 5:45 AM K 5.7. Recheck in 4 hours  Intervention Category Minor Interventions: Clinical assessment - ordering diagnostic tests  Marie Brewer Marie Brewer 07/21/2023, 12:19 AM

## 2023-07-21 NOTE — Progress Notes (Addendum)
Pharmacy Antibiotic Note  Marie Brewer is a 42 y.o. female admitted on 07/16/2023 with AMS and found to have strep pyogenes bacteremia. MRSA also noted to be growing in RCx from 12/17. Narrowing Cefepime to Penicillin for now, keeping Linezolid on for now - provides toxin inhibition and MRSA coverage.   CRRT discontinued on afternoon of 12/20 and resumed 12/21, will continue to dose penicillin at a 75% reduction. Will dose intermittent for now - but can be switched to a continuous infusion based on RN preference and line availability.   Plan: - PCN to 3 million units IV every 4 hours - Continue Linezolid 600 mg IV every 12 hours - Will continue to follow renal function / dialysis plans, culture results, LOT, and antibiotic de-escalation plans   Height: 5\' 6"  (167.6 cm) Weight: 65.2 kg (143 lb 11.8 oz) IBW/kg (Calculated) : 59.3  Temp (24hrs), Avg:98.6 F (37 C), Min:97.2 F (36.2 C), Max:100 F (37.8 C)  Recent Labs  Lab 07/16/23 1807 07/16/23 2007 07/16/23 2130 07/18/23 0346 07/18/23 1524 07/19/23 0300 07/19/23 0656 07/19/23 1530 07/20/23 0308 07/20/23 1549 07/21/23 0028 07/21/23 0442  WBC  --   --    < > 36.1*  --  47.3* 50.0*  --  58.2*  --   --  62.8*  CREATININE  --   --    < > 2.47*   < > 1.70*  --  1.41* 1.49* 1.62* 2.90* 3.42*  LATICACIDVEN 6.1* 0.8  --   --   --   --   --   --   --   --  1.9  --    < > = values in this interval not displayed.    Estimated Creatinine Clearance: 19.9 mL/min (A) (by C-G formula based on SCr of 3.42 mg/dL (H)).    Allergies  Allergen Reactions   Imitrex [Sumatriptan Base] Palpitations    Heart races   Cephalexin Rash    Patient passed amoxicillin challenge on 08/18/20, no adverse effect    Cefepime 12/16 >> 12/19 Vancomycin 12/16 x 1 Flagyl 12/17 >> 12/18 Linezolid 12/17 >> Penicillin 12/19 >>  Ucx 12/17 sent  BloodCx 12/16 Strep Pyogenes (pan sensitive) BloodCx 12/17 MRSE  MRSA PCR 12/16 positive  TA 12/17 pending  AFB  12/17 pending  BAL 12/17 MRSA Ucx 12/17 multiple species  RVP 12/17 rhino/entero + Cdiff 12/20 negative     Thank you for allowing pharmacy to be a part of this patient's care.  Cedric Fishman, PharmD, BCPS, BCCCP Clinical Pharmacist

## 2023-07-21 NOTE — Progress Notes (Addendum)
Called to bedside.  Over last few hours  WOB increased w/ EE 35-38 w/ guppy efforts  Febrile/tachycardic  Mental status change: did have weak cough and gag. Now unable to elicit this. Pupils sluggishly responsive. Left might be a little larger than right   BP 110/66   Pulse (!) 126   Temp 100 F (37.8 C) (Oral) Comment: RN notified  Resp (!) 30   Ht 5\' 6"  (1.676 m)   Wt 66.7 kg   SpO2 93%   BMI 23.73 kg/m     prismasol BGK 4/2.5 800 mL/hr at 07/20/23 0747    prismasol BGK 4/2.5 400 mL/hr at 07/19/23 1840   feeding supplement (VITAL 1.5 CAL) 50 mL/hr at 07/20/23 2100   fentaNYL infusion INTRAVENOUS 175 mcg/hr (07/20/23 2100)   heparin 10,000 units/ 20 mL infusion syringe 750 Units/hr (07/20/23 1426)   norepinephrine (LEVOPHED) Adult infusion Stopped (07/20/23 0411)   pencillin G potassium IV 3 Million Units (07/20/23 2350)   prismasol BGK 4/2.5 2,000 mL/hr at 07/20/23 0915   propofol (DIPRIVAN) infusion 40 mcg/kg/min (07/20/23 2237)    Exam  Critically ill appearing female currently w/ marked labored air hungry appearing respiratory efforts HENT orally intubated has right internal jugular in place. Pupils sluggish  Pulm decreased t/o labored w/ accessory use and marked air hunger appearance  Stat PCXR personally reviewed. Rotated. Looks fairly unchanged from prior film certainly nothing to explain her Ve change  ABG    Component Value Date/Time   PHART 7.225 (L) 07/21/2023 0039   PCO2ART 57.6 (H) 07/21/2023 0039   PO2ART 86 07/21/2023 0039   HCO3 23.6 07/21/2023 0039   TCO2 25 07/21/2023 0039   ACIDBASEDEF 4.0 (H) 07/21/2023 0039   O2SAT 93 07/21/2023 0039   Card tachy rrr Abd soft Ext diffusely mottled.  Neuro GCS 3 not responding  Impression/plan Gp A strep Bacteremia  Sepsis/septic shock Obstructive Uropathy s/p bilateral nephrostomy tubes MRSA PNA Acute hypoxic resp failure Acute on chronic renal failure CKD stage IV Acute metabolic encephalopathy w/ change  in mental status exam   Discussion Clinical decline over course of evening and early am hours. She is currently remains off pressors but has marked increased resp effort suggesting more metabolic etiology but abg favoring respiratory acidosis. I looked at her CXR. Really doesn't look that different.  Fever could be driving some of this as well but prelim abg again favoring resp.   Plan Increased RR to 32 (In attempt to met Ve demand) lactic acid and chem pending Treat fever CT head   My time 32 min   Simonne Martinet ACNP-BC [facility] Pulmonary/Critical Care Pager # [phone] OR # [phone] if no answer

## 2023-07-21 NOTE — Progress Notes (Addendum)
Subjective:  Intubated, sedated  Antibiotics:  Anti-infectives (From admission, onward)    Start     Dose/Rate Route Frequency Ordered Stop   07/21/23 1200  penicillin G potassium 3 Million Units in dextrose 50mL IVPB        3 Million Units 100 mL/hr over 30 Minutes Intravenous Every 4 hours 07/21/23 0928     07/21/23 0800  penicillin G potassium 2 Million Units in dextrose 5 % 50 mL IVPB  Status:  Discontinued        2 Million Units 100 mL/hr over 30 Minutes Intravenous Every 4 hours 07/21/23 0703 07/21/23 0928   07/20/23 2200  linezolid (ZYVOX) tablet 600 mg        600 mg Per Tube Every 12 hours 07/20/23 1055     07/19/23 1800  penicillin G potassium 4 Million Units in dextrose 5 % 250 mL IVPB  Status:  Discontinued        4 Million Units 250 mL/hr over 60 Minutes Intravenous Every 6 hours 07/19/23 1344 07/19/23 1347   07/19/23 1600  penicillin G potassium 3 Million Units in dextrose 50mL IVPB  Status:  Discontinued        3 Million Units 100 mL/hr over 30 Minutes Intravenous Every 4 hours 07/19/23 1347 07/21/23 0703   07/17/23 2000  ceFEPIme (MAXIPIME) 1 g in sodium chloride 0.9 % 100 mL IVPB  Status:  Discontinued        1 g 200 mL/hr over 30 Minutes Intravenous Every 24 hours 07/16/23 2219 07/17/23 0146   07/17/23 1800  vancomycin (VANCOCIN) IVPB 1000 mg/200 mL premix  Status:  Discontinued        1,000 mg 200 mL/hr over 60 Minutes Intravenous Every 24 hours 07/17/23 0147 07/17/23 1319   07/17/23 1445  metroNIDAZOLE (FLAGYL) IVPB 500 mg  Status:  Discontinued        500 mg 100 mL/hr over 60 Minutes Intravenous Every 12 hours 07/17/23 1353 07/18/23 1706   07/17/23 1415  linezolid (ZYVOX) IVPB 600 mg  Status:  Discontinued        600 mg 300 mL/hr over 60 Minutes Intravenous Every 12 hours 07/17/23 1319 07/20/23 1055   07/17/23 0600  ceFEPIme (MAXIPIME) 2 g in sodium chloride 0.9 % 100 mL IVPB  Status:  Discontinued        2 g 200 mL/hr over 30 Minutes Intravenous  Every 12 hours 07/17/23 0146 07/19/23 1344   07/16/23 1700  ceFEPIme (MAXIPIME) 2 g in sodium chloride 0.9 % 100 mL IVPB        2 g 200 mL/hr over 30 Minutes Intravenous  Once 07/16/23 1649 07/16/23 1754   07/16/23 1700  Vancomycin (VANCOCIN) 1,500 mg in sodium chloride 0.9 % 500 mL IVPB        1,500 mg 250 mL/hr over 120 Minutes Intravenous  Once 07/16/23 1653 07/16/23 2001       Medications: Scheduled Meds:  sodium chloride   Intravenous Once   Chlorhexidine Gluconate Cloth  6 each Topical Daily   docusate  100 mg Per Tube BID   etomidate  0.3 mg/kg Intravenous Once   feeding supplement (PROSource TF20)  60 mL Per Tube BID   fiber supplement (BANATROL TF)  60 mL Per Tube BID   insulin aspart  0-9 Units Subcutaneous Q4H   linezolid  600 mg Per Tube Q12H   methadone  5 mg Per Tube Q8H   multivitamin  1  tablet Per Tube QHS   mouth rinse  15 mL Mouth Rinse Q2H   pantoprazole (PROTONIX) IV  40 mg Intravenous QHS   polyethylene glycol  17 g Per Tube Daily   sodium chloride flush  5 mL Intracatheter Q8H   sodium zirconium cyclosilicate  10 g Per Tube Daily   Continuous Infusions:   prismasol BGK 4/2.5 800 mL/hr at 07/21/23 1105    prismasol BGK 4/2.5 400 mL/hr at 07/21/23 1105   feeding supplement (VITAL 1.5 CAL) 50 mL/hr at 07/21/23 1100   fentaNYL infusion INTRAVENOUS 100 mcg/hr (07/21/23 1100)   heparin 10,000 units/ 20 mL infusion syringe 350 Units/hr (07/21/23 1012)   norepinephrine (LEVOPHED) Adult infusion Stopped (07/20/23 0411)   pencillin G potassium IV 3 Million Units (07/21/23 1139)   prismasol BGK 4/2.5 2,000 mL/hr at 07/21/23 1105   propofol (DIPRIVAN) infusion 30 mcg/kg/min (07/21/23 1100)   PRN Meds:.acetaminophen, fentaNYL, heparin, heparin, midazolam, mouth rinse    Objective: Weight change: -1.5 kg  Intake/Output Summary (Last 24 hours) at 07/21/2023 1201 Last data filed at 07/21/2023 1115 Gross per 24 hour  Intake 3059.57 ml  Output 1825 ml  Net  1234.57 ml   Blood pressure 125/76, pulse (!) 114, temperature 100.3 F (37.9 C), temperature source Axillary, resp. rate (!) 34, height 5\' 6"  (1.676 m), weight 65.2 kg, SpO2 97%. Temp:  [97.2 F (36.2 C)-100.3 F (37.9 C)] 100.3 F (37.9 C) (12/21 1125) Pulse Rate:  [110-134] 114 (12/21 1100) Resp:  [26-38] 34 (12/21 1100) BP: (81-125)/(55-80) 125/76 (12/21 1100) SpO2:  [93 %-100 %] 97 % (12/21 1100) FiO2 (%):  [40 %] 40 % (12/21 1027) Weight:  [65.2 kg] 65.2 kg (12/21 0500)  Physical Exam: Physical Exam Constitutional:      Appearance: She is ill-appearing.     Interventions: She is intubated.  HENT:     Head: Normocephalic and atraumatic.     Right Ear: External ear normal.     Left Ear: External ear normal.     Mouth/Throat:     Pharynx: No oropharyngeal exudate.  Eyes:     General: No scleral icterus. Cardiovascular:     Rate and Rhythm: Normal rate and regular rhythm.  Pulmonary:     Effort: She is intubated.     Breath sounds: Rhonchi present.  Abdominal:     General: There is no distension.     Palpations: Abdomen is soft.  Musculoskeletal:        General: No tenderness. Normal range of motion.  Lymphadenopathy:     Cervical: No cervical adenopathy.  Skin:    General: Skin is warm and dry.     Coloration: Skin is not pale.     Findings: Rash present.  Neurological:     General: No focal deficit present.     Mental Status: She is oriented to person, place, and time.     Motor: No abnormal muscle tone.     Coordination: Coordination normal.  Psychiatric:        Mood and Affect: Mood normal.        Behavior: Behavior normal.        Thought Content: Thought content normal.        Judgment: Judgment normal.      Lower extremities 07/21/2023:       CBC:    BMET Recent Labs    07/21/23 0028 07/21/23 0039 07/21/23 0442 07/21/23 1052  NA 130* 132* 134*  --   K 6.7* 6.5*  5.7*  5.7* 6.1*  CL 97*  --  96*  --   CO2 20*  --  25  --   GLUCOSE  133*  --  150*  --   BUN 31*  --  42*  --   CREATININE 2.90*  --  3.42*  --   CALCIUM 8.5*  --  8.8*  --      Liver Panel  Recent Labs    07/19/23 1431 07/19/23 1530 07/21/23 0028 07/21/23 0442  PROT 8.1  --  8.7* 8.6*  ALBUMIN 1.8*   < > 1.6* 1.6*  1.6*  AST 131*  --  120* 451*  ALT 61*  --  65* 161*  ALKPHOS 122  --  122 123  BILITOT 0.7  --  0.7 0.7  BILIDIR 0.3*  --   --  <0.1  IBILI 0.4  --   --  NOT CALCULATED   < > = values in this interval not displayed.       Sedimentation Rate No results for input(s): "ESRSEDRATE" in the last 72 hours. C-Reactive Protein No results for input(s): "CRP" in the last 72 hours.  Micro Results: Recent Results (from the past 720 hours)  Resp panel by RT-PCR (RSV, Flu A&B, Covid) Anterior Nasal Swab     Status: None   Collection Time: 07/16/23  4:49 PM   Specimen: Anterior Nasal Swab  Result Value Ref Range Status   SARS Coronavirus 2 by RT PCR NEGATIVE NEGATIVE Final   Influenza A by PCR NEGATIVE NEGATIVE Final   Influenza B by PCR NEGATIVE NEGATIVE Final    Comment: (NOTE) The Xpert Xpress SARS-CoV-2/FLU/RSV plus assay is intended as an aid in the diagnosis of influenza from Nasopharyngeal swab specimens and should not be used as a sole basis for treatment. Nasal washings and aspirates are unacceptable for Xpert Xpress SARS-CoV-2/FLU/RSV testing.  Fact Sheet for Patients: BloggerCourse.com  Fact Sheet for Healthcare Providers: SeriousBroker.it  This test is not yet approved or cleared by the [place] FDA and has been authorized for detection and/or diagnosis of SARS-CoV-2 by FDA under an Emergency Use Authorization (EUA). This EUA will remain in effect (meaning this test can be used) for the duration of the COVID-19 declaration under Section 564(b)(1) of the Act, 21 U.S.C. section 360bbb-3(b)(1), unless the authorization is terminated or revoked.     Resp  Syncytial Virus by PCR NEGATIVE NEGATIVE Final    Comment: (NOTE) Fact Sheet for Patients: BloggerCourse.com  Fact Sheet for Healthcare Providers: SeriousBroker.it  This test is not yet approved or cleared by the [place] FDA and has been authorized for detection and/or diagnosis of SARS-CoV-2 by FDA under an Emergency Use Authorization (EUA). This EUA will remain in effect (meaning this test can be used) for the duration of the COVID-19 declaration under Section 564(b)(1) of the Act, 21 U.S.C. section 360bbb-3(b)(1), unless the authorization is terminated or revoked.  Performed at [facility], [address], [place], [place] [address]   Blood culture (routine x 2)     Status: Abnormal   Collection Time: 07/16/23  4:50 PM   Specimen: BLOOD  Result Value Ref Range Status   Specimen Description BLOOD SITE NOT SPECIFIED  Final   Special Requests   Final    BOTTLES DRAWN AEROBIC ONLY Blood Culture adequate volume   Culture  Setup Time   Final    GRAM POSITIVE COCCI IN CHAINS ANAEROBIC BOTTLE ONLY CRITICAL RESULT CALLED TO, READ BACK  BY AND VERIFIED WITH: PHARMD ELIZABETH M. [id number] FCP GRAM POSITIVE COCCI IN CLUSTERS BOTTLES DRAWN AEROBIC ONLY CRITICAL RESULT CALLED TO, READ BACK BY AND VERIFIED WITH: PHARMD JESSICA MILLEN ON 07/17/23 @ 1723 BY DRT    Culture (A)  Final    STREPTOCOCCUS PYOGENES STAPHYLOCOCCUS EPIDERMIDIS THE SIGNIFICANCE OF ISOLATING THIS ORGANISM FROM A SINGLE SET OF BLOOD CULTURES WHEN MULTIPLE SETS ARE DRAWN IS UNCERTAIN. PLEASE NOTIFY THE MICROBIOLOGY DEPARTMENT WITHIN ONE WEEK IF SPECIATION AND SENSITIVITIES ARE REQUIRED. HEALTH DEPARTMENT NOTIFIED Performed at [facility], [address], [place], [place] [address]    Report Status 07/19/2023 FINAL  Final   Organism ID, Bacteria STREPTOCOCCUS PYOGENES  Final      Susceptibility   Streptococcus pyogenes - MIC*    PENICILLIN <=0.06  SENSITIVE Sensitive     CEFTRIAXONE <=0.12 SENSITIVE Sensitive     ERYTHROMYCIN <=0.12 SENSITIVE Sensitive     LEVOFLOXACIN 2 SENSITIVE Sensitive     VANCOMYCIN 0.5 SENSITIVE Sensitive     * STREPTOCOCCUS PYOGENES  Blood Culture ID Panel (Reflexed)     Status: Abnormal   Collection Time: 07/16/23  4:50 PM  Result Value Ref Range Status   Enterococcus faecalis NOT DETECTED NOT DETECTED Final   Enterococcus Faecium NOT DETECTED NOT DETECTED Final   Listeria monocytogenes NOT DETECTED NOT DETECTED Final   Staphylococcus species NOT DETECTED NOT DETECTED Final   Staphylococcus aureus (BCID) NOT DETECTED NOT DETECTED Final   Staphylococcus epidermidis NOT DETECTED NOT DETECTED Final   Staphylococcus lugdunensis NOT DETECTED NOT DETECTED Final   Streptococcus species DETECTED (A) NOT DETECTED Final    Comment: CRITICAL RESULT CALLED TO, READ BACK BY AND VERIFIED WITH: PHARMD ELIZABETH M. [phone] [id number] FCP    Streptococcus agalactiae NOT DETECTED NOT DETECTED Final   Streptococcus pneumoniae NOT DETECTED NOT DETECTED Final   Streptococcus pyogenes DETECTED (A) NOT DETECTED Final    Comment: CRITICAL RESULT CALLED TO, READ BACK BY AND VERIFIED WITH: PHARMD ELIZABETH M. [phone] [id number] FCP    A.calcoaceticus-baumannii NOT DETECTED NOT DETECTED Final   Bacteroides fragilis NOT DETECTED NOT DETECTED Final   Enterobacterales NOT DETECTED NOT DETECTED Final   Enterobacter cloacae complex NOT DETECTED NOT DETECTED Final   Escherichia coli NOT DETECTED NOT DETECTED Final   Klebsiella aerogenes NOT DETECTED NOT DETECTED Final   Klebsiella oxytoca NOT DETECTED NOT DETECTED Final   Klebsiella pneumoniae NOT DETECTED NOT DETECTED Final   Proteus species NOT DETECTED NOT DETECTED Final   Salmonella species NOT DETECTED NOT DETECTED Final   Serratia marcescens NOT DETECTED NOT DETECTED Final   Haemophilus influenzae NOT DETECTED NOT DETECTED Final   Neisseria meningitidis NOT DETECTED NOT DETECTED Final    Pseudomonas aeruginosa NOT DETECTED NOT DETECTED Final   Stenotrophomonas maltophilia NOT DETECTED NOT DETECTED Final   Candida albicans NOT DETECTED NOT DETECTED Final   Candida auris NOT DETECTED NOT DETECTED Final   Candida glabrata NOT DETECTED NOT DETECTED Final   Candida krusei NOT DETECTED NOT DETECTED Final   Candida parapsilosis NOT DETECTED NOT DETECTED Final   Candida tropicalis NOT DETECTED NOT DETECTED Final   Cryptococcus neoformans/gattii NOT DETECTED NOT DETECTED Final    Comment: Performed at [facility], [address] [place], [place] [address]  MRSA Next Gen by PCR, Nasal     Status: Abnormal   Collection Time: 07/16/23  9:37 PM   Specimen: Nasal Mucosa; Nasal Swab  Result Value Ref Range Status   MRSA by  PCR Next Gen DETECTED (A) NOT DETECTED Final    Comment: RESULT CALLED TO, READ BACK BY AND VERIFIED WITH: B SCOTT  RN 07/16/2023 @ 2325 BY AB (NOTE) The GeneXpert MRSA Assay (FDA approved for NASAL specimens only), is one component of a comprehensive MRSA colonization surveillance program. It is not intended to diagnose MRSA infection nor to guide or monitor treatment for MRSA infections. Test performance is not FDA approved in patients less than 79 years old. Performed at [facility], [address] [place], [place] [address]   Blood culture (routine x 2)     Status: None   Collection Time: 07/16/23 10:36 PM   Specimen: BLOOD RIGHT HAND  Result Value Ref Range Status   Specimen Description BLOOD RIGHT HAND  Final   Special Requests   Final    BOTTLES DRAWN AEROBIC AND ANAEROBIC Blood Culture results may not be optimal due to an inadequate volume of blood received in culture bottles   Culture   Final    NO GROWTH 5 DAYS Performed at [facility], [address] [place], [place] [address]    Report Status 07/21/2023 FINAL  Final  Urine Culture (for pregnant, neutropenic or urologic patients or patients with an indwelling urinary  catheter)     Status: Abnormal   Collection Time: 07/17/23  7:33 AM   Specimen: Urine, Catheterized  Result Value Ref Range Status   Specimen Description URINE, CATHETERIZED  Final   Special Requests   Final    NONE Performed at [facility], [address] [place], [place] [address]    Culture MULTIPLE SPECIES PRESENT, SUGGEST RECOLLECTION (A)  Final   Report Status 07/18/2023 FINAL  Final  Respiratory (~20 pathogens) panel by PCR     Status: Abnormal   Collection Time: 07/17/23 10:58 AM   Specimen: Nasopharyngeal Swab; Respiratory  Result Value Ref Range Status   Adenovirus NOT DETECTED NOT DETECTED Final   Coronavirus 229E NOT DETECTED NOT DETECTED Final    Comment: (NOTE) The Coronavirus on the Respiratory Panel, DOES NOT test for the novel  Coronavirus (2019 nCoV)    Coronavirus HKU1 NOT DETECTED NOT DETECTED Final   Coronavirus NL63 NOT DETECTED NOT DETECTED Final   Coronavirus OC43 NOT DETECTED NOT DETECTED Final   Metapneumovirus NOT DETECTED NOT DETECTED Final   Rhinovirus / Enterovirus DETECTED (A) NOT DETECTED Final   Influenza A NOT DETECTED NOT DETECTED Final   Influenza B NOT DETECTED NOT DETECTED Final   Parainfluenza Virus 1 NOT DETECTED NOT DETECTED Final   Parainfluenza Virus 2 NOT DETECTED NOT DETECTED Final   Parainfluenza Virus 3 NOT DETECTED NOT DETECTED Final   Parainfluenza Virus 4 NOT DETECTED NOT DETECTED Final   Respiratory Syncytial Virus NOT DETECTED NOT DETECTED Final   Bordetella pertussis NOT DETECTED NOT DETECTED Final   Bordetella Parapertussis NOT DETECTED NOT DETECTED Final   Chlamydophila pneumoniae NOT DETECTED NOT DETECTED Final   Mycoplasma pneumoniae NOT DETECTED NOT DETECTED Final    Comment: Performed at [facility], [address] [place], [place] [address]  Culture, Respiratory w Gram Stain     Status: None   Collection Time: 07/17/23 11:14 AM   Specimen: Bronchoalveolar Lavage; Respiratory  Result Value Ref Range  Status   Specimen Description BRONCHIAL ALVEOLAR LAVAGE  Final   Special Requests Normal  Final   Gram Stain   Final    ABUNDANT WBC PRESENT, PREDOMINANTLY PMN NO ORGANISMS SEEN  Performed at [facility], [address] [place], [place] [address]    Culture RARE METHICILLIN RESISTANT STAPHYLOCOCCUS AUREUS  Final   Report Status 07/19/2023 FINAL  Final   Organism ID, Bacteria METHICILLIN RESISTANT STAPHYLOCOCCUS AUREUS  Final      Susceptibility   Methicillin resistant staphylococcus aureus - MIC*    CIPROFLOXACIN 4 RESISTANT Resistant     ERYTHROMYCIN >=8 RESISTANT Resistant     GENTAMICIN <=0.5 SENSITIVE Sensitive     OXACILLIN >=4 RESISTANT Resistant     TETRACYCLINE <=1 SENSITIVE Sensitive     VANCOMYCIN 1 SENSITIVE Sensitive     TRIMETH/SULFA 160 RESISTANT Resistant     CLINDAMYCIN <=0.25 SENSITIVE Sensitive     RIFAMPIN <=0.5 SENSITIVE Sensitive     Inducible Clindamycin NEGATIVE Sensitive     LINEZOLID 2 SENSITIVE Sensitive     * RARE METHICILLIN RESISTANT STAPHYLOCOCCUS AUREUS  Acid Fast Smear (AFB)     Status: None   Collection Time: 07/17/23 11:14 AM   Specimen: Bronchoalveolar Lavage; Respiratory  Result Value Ref Range Status   AFB Specimen Processing Concentration  Final   Acid Fast Smear Negative  Final    Comment: (NOTE) Performed At: [facility] [address] [place], [place] [id number] Jolene Schimke MD UU:[id number]    Source (AFB) BRONCHIAL ALVEOLAR LAVAGE  Final    Comment: Performed at [facility], [address] [place], [place] [address]  Blood Culture ID Panel (Reflexed)     Status: Abnormal   Collection Time: 07/17/23  6:57 PM  Result Value Ref Range Status   Enterococcus faecalis NOT DETECTED NOT DETECTED Final   Enterococcus Faecium NOT DETECTED NOT DETECTED Final   Listeria monocytogenes NOT DETECTED NOT DETECTED Final   Staphylococcus species DETECTED (A) NOT DETECTED Final    Comment: CRITICAL RESULT CALLED TO, READ  BACK BY AND VERIFIED WITH: PHARMD JESSICA MILLEN ON 07/17/23 @ 1700 BY DRT    Staphylococcus aureus (BCID) NOT DETECTED NOT DETECTED Final   Staphylococcus epidermidis DETECTED (A) NOT DETECTED Final    Comment: Methicillin (oxacillin) resistant coagulase negative staphylococcus. Possible blood culture contaminant (unless isolated from more than one blood culture draw or clinical case suggests pathogenicity). No antibiotic treatment is indicated for blood  culture contaminants. PHARMD JESSICA MILLEN ON 07/17/23 @ 1700 BY DRT    Staphylococcus lugdunensis NOT DETECTED NOT DETECTED Final   Streptococcus species NOT DETECTED NOT DETECTED Final   Streptococcus agalactiae NOT DETECTED NOT DETECTED Final   Streptococcus pneumoniae NOT DETECTED NOT DETECTED Final   Streptococcus pyogenes NOT DETECTED NOT DETECTED Final   A.calcoaceticus-baumannii NOT DETECTED NOT DETECTED Final   Bacteroides fragilis NOT DETECTED NOT DETECTED Final   Enterobacterales NOT DETECTED NOT DETECTED Final   Enterobacter cloacae complex NOT DETECTED NOT DETECTED Final   Escherichia coli NOT DETECTED NOT DETECTED Final   Klebsiella aerogenes NOT DETECTED NOT DETECTED Final   Klebsiella oxytoca NOT DETECTED NOT DETECTED Final   Klebsiella pneumoniae NOT DETECTED NOT DETECTED Final   Proteus species NOT DETECTED NOT DETECTED Final   Salmonella species NOT DETECTED NOT DETECTED Final   Serratia marcescens NOT DETECTED NOT DETECTED Final   Haemophilus influenzae NOT DETECTED NOT DETECTED Final   Neisseria meningitidis NOT DETECTED NOT DETECTED Final   Pseudomonas aeruginosa NOT DETECTED NOT DETECTED Final   Stenotrophomonas maltophilia NOT DETECTED NOT DETECTED Final   Candida albicans NOT DETECTED NOT DETECTED Final   Candida auris NOT DETECTED NOT DETECTED Final  Candida glabrata NOT DETECTED NOT DETECTED Final   Candida krusei NOT DETECTED NOT DETECTED Final   Candida parapsilosis NOT DETECTED NOT DETECTED Final    Candida tropicalis NOT DETECTED NOT DETECTED Final   Cryptococcus neoformans/gattii NOT DETECTED NOT DETECTED Final   Methicillin resistance mecA/C DETECTED (A) NOT DETECTED Final    Comment: CRITICAL RESULT CALLED TO, READ BACK BY AND VERIFIED WITH: PHARMD JESSICA MILLEN ON 07/17/23 @ 1700 BY DRT Performed at [facility], [address] [place], [place] [address]   C Difficile Quick Screen (NO PCR Reflex)     Status: None   Collection Time: 07/20/23  7:27 AM   Specimen: STOOL  Result Value Ref Range Status   C Diff antigen NEGATIVE NEGATIVE Final   C Diff toxin NEGATIVE NEGATIVE Final   C Diff interpretation No C. difficile detected.  Final    Comment: Performed at [facility], [address], [place], [place] [address]  Culture, blood (Routine X 2) w Reflex to ID Panel     Status: None (Preliminary result)   Collection Time: 07/20/23  9:42 AM   Specimen: BLOOD RIGHT ARM  Result Value Ref Range Status   Specimen Description BLOOD RIGHT ARM  Final   Special Requests   Final    AEROBIC BOTTLE ONLY Blood Culture results may not be optimal due to an inadequate volume of blood received in culture bottles   Culture   Final    NO GROWTH < 24 HOURS Performed at [facility], [address], [place], [place] [address]    Report Status PENDING  Incomplete  Culture, blood (Routine X 2) w Reflex to ID Panel     Status: None (Preliminary result)   Collection Time: 07/20/23  9:44 AM   Specimen: BLOOD LEFT HAND  Result Value Ref Range Status   Specimen Description BLOOD LEFT HAND  Final   Special Requests   Final    BOTTLES DRAWN AEROBIC AND ANAEROBIC Blood Culture adequate volume   Culture   Final    NO GROWTH < 24 HOURS Performed at [facility], [address], [place], [place] [address]    Report Status PENDING  Incomplete    Studies/Results: DG Chest Port 1 View Result Date: 07/21/2023 CLINICAL DATA:  Respiratory distress EXAM: PORTABLE CHEST 1 VIEW  COMPARISON:  07/20/2023 FINDINGS: Endotracheal tube tip measures about 2 cm above the carina. Enteric tube tip projects over the left upper quadrant consistent with location in the upper stomach. Right central venous catheter tip projects over the cavoatrial junction region. No pneumothorax. Shallow inspiration. Heart size and pulmonary vascularity are normal. No airspace disease or consolidation in the lungs. No pleural effusions. No pneumothorax. Mediastinal contours appear intact. Pigtail catheter is demonstrated in the upper abdomen, likely nephrostomy tubes. IMPRESSION: Appliances appear in satisfactory position. No evidence of active pulmonary disease. Electronically Signed   By: Burman Nieves M.D.   On: 07/21/2023 01:31   CT HEAD WO CONTRAST ( ) Result Date: 07/21/2023 CLINICAL DATA:  Mental status change, persistent or worsening over the last few hours. Fever and tachycardia. EXAM: CT HEAD WITHOUT CONTRAST TECHNIQUE: Contiguous axial images were obtained from the base of the skull through the vertex without intravenous contrast. RADIATION DOSE REDUCTION: This exam was performed according to the departmental dose-optimization program which includes automated exposure control, adjustment of the mA and/or kV according to patient size and/or use of iterative reconstruction technique. COMPARISON:  07/16/2023 FINDINGS:  Brain: No evidence of acute infarction, hemorrhage, hydrocephalus, extra-axial collection or mass lesion/mass effect. Vascular: No hyperdense vessel or unexpected calcification. Skull: Normal. Negative for fracture or focal lesion. Sinuses/Orbits: Mucosal thickening throughout the paranasal sinuses. No acute air-fluid levels. Mastoid air cells are clear. Other: None. IMPRESSION: No acute intracranial abnormalities. Mucosal thickening in the paranasal sinuses consistent with chronic inflammation. Similar appearance to previous study. Electronically Signed   By: Burman Nieves M.D.   On:  07/21/2023 01:30   CT ABDOMEN PELVIS WO CONTRAST Result Date: 07/20/2023 CLINICAL DATA:  Sepsis and leukocytosis. EXAM: CT ABDOMEN AND PELVIS WITHOUT CONTRAST TECHNIQUE: Multidetector CT imaging of the abdomen and pelvis was performed following the standard protocol without IV contrast. RADIATION DOSE REDUCTION: This exam was performed according to the departmental dose-optimization program which includes automated exposure control, adjustment of the mA and/or kV according to patient size and/or use of iterative reconstruction technique. COMPARISON:  CT of the chest abdomen pelvis dated 07/16/2023. FINDINGS: Evaluation of this exam is limited in the absence of intravenous contrast. Lower chest: Partially visualized bilateral lower lobe nodular densities and areas of consolidation, progressed since the prior CT consistent with pneumonia or aspiration. No intra-abdominal free air or free fluid. Hepatobiliary: The liver is unremarkable. No biliary ductal dilatation. Cholecystectomy. No retained calcified stone noted in the central CBD. Pancreas: Unremarkable. No pancreatic ductal dilatation or surrounding inflammatory changes. Spleen: Normal in size without focal abnormality. Adrenals/Urinary Tract: The adrenal glands are unremarkable. Bilateral percutaneous nephrostomies. The pigtail tip of the left nephrostomy tube is in the region of the renal pelvis and the tip of the right tube is in the inferior pole collecting system. Small pockets of air in the collecting system introduced via nephrostomy. Interval removal of the ureteral stents. No hydronephrosis. The urinary bladder is minimally distended and grossly unremarkable. Stomach/Bowel: Enteric tube with tip in the proximal stomach. A rectal tube is in place. There is loose content in the colon. There is no bowel obstruction. The appendix is normal. Vascular/Lymphatic: Mild aortoiliac atherosclerotic disease. The IVC is unremarkable. No portal venous gas. There  is no adenopathy. Reproductive: The uterus is grossly unremarkable. No suspicious adnexal masses. Other: None Musculoskeletal: No acute or significant osseous findings. IMPRESSION: 1. Bilateral percutaneous nephrostomies. No hydronephrosis. 2. Progression of bilateral pulmonary opacities. 3.  Aortic Atherosclerosis (ICD10-I70.0). Electronically Signed   By: Elgie Collard M.D.   On: 07/20/2023 17:12   ECHOCARDIOGRAM COMPLETE Result Date: 07/20/2023    ECHOCARDIOGRAM REPORT   Patient Name:   KIESHA HALLING Date of Exam: 07/20/2023 Medical Rec #:  [id number]    Height:       66.0 in Accession #:    [id number]   Weight:       147.0 lb Date of Birth:  1980-10-23    BSA:          1.755 m Patient Age:    42 years     BP:           102/70 mmHg Patient Gender: F            HR:           117 bpm. Exam Location:  Inpatient Procedure: 2D Echo, Cardiac Doppler and Color Doppler Indications:    Bacteremia R78.81  History:        Patient has no prior history of Echocardiogram examinations.  Sonographer:    Webb Laws Referring Phys: [id number] ROBERT S BYRUM IMPRESSIONS  1. Left ventricular ejection fraction, by estimation,  is 70 to 75%. The left ventricle has hyperdynamic function. The left ventricle has no regional wall motion abnormalities. Left ventricular diastolic parameters are indeterminate.  2. Right ventricular systolic function is normal. The right ventricular size is normal. Tricuspid regurgitation signal is inadequate for assessing PA pressure.  3. The mitral valve is normal in structure. Trivial mitral valve regurgitation. No evidence of mitral stenosis.  4. The aortic valve is grossly normal. Aortic valve regurgitation is not visualized. No aortic stenosis is present.  5. The inferior vena cava is normal in size with greater than 50% respiratory variability, suggesting right atrial pressure of 3 mmHg. Conclusion(s)/Recommendation(s): No evidence of valvular vegetations on this transthoracic echocardiogram.  Consider a transesophageal echocardiogram to exclude infective endocarditis if clinically indicated. FINDINGS  Left Ventricle: Left ventricular ejection fraction, by estimation, is 70 to 75%. The left ventricle has hyperdynamic function. The left ventricle has no regional wall motion abnormalities. The left ventricular internal cavity size was normal in size. There is no left ventricular hypertrophy. Left ventricular diastolic parameters are indeterminate. Right Ventricle: The right ventricular size is normal. No increase in right ventricular wall thickness. Right ventricular systolic function is normal. Tricuspid regurgitation signal is inadequate for assessing PA pressure. Left Atrium: Left atrial size was normal in size. Right Atrium: Right atrial size was normal in size. Pericardium: There is no evidence of pericardial effusion. Mitral Valve: The mitral valve is normal in structure. Trivial mitral valve regurgitation. No evidence of mitral valve stenosis. Tricuspid Valve: The tricuspid valve is normal in structure. Tricuspid valve regurgitation is trivial. No evidence of tricuspid stenosis. Aortic Valve: The aortic valve is grossly normal. Aortic valve regurgitation is not visualized. No aortic stenosis is present. Pulmonic Valve: The pulmonic valve was normal in structure. Pulmonic valve regurgitation is not visualized. No evidence of pulmonic stenosis. Aorta: The aortic root is normal in size and structure. Venous: The inferior vena cava is normal in size with greater than 50% respiratory variability, suggesting right atrial pressure of 3 mmHg. IAS/Shunts: No atrial level shunt detected by color flow Doppler.  LEFT VENTRICLE PLAX 2D LVIDd:         3.60 cm     Diastology LVIDs:         3.00 cm     LV e' medial:    7.18 cm/s LV PW:         0.90 cm     LV E/e' medial:  9.3 LV IVS:        0.70 cm     LV e' lateral:   9.25 cm/s LVOT diam:     1.70 cm     LV E/e' lateral: 7.2 LV SV:         28 LV SV Index:   16 LVOT  Area:     2.27 cm  LV Volumes (MOD) LV vol d, MOD A2C: 45.0 ml LV vol d, MOD A4C: 51.8 ml LV vol s, MOD A2C: 12.7 ml LV vol s, MOD A4C: 16.1 ml LV SV MOD A2C:     32.3 ml LV SV MOD A4C:     51.8 ml LV SV MOD BP:      36.3 ml RIGHT VENTRICLE             IVC RV Basal diam:  2.00 cm     IVC diam: 1.20 cm RV S prime:     22.20 cm/s TAPSE (M-mode): 1.7 cm LEFT ATRIUM  Index        RIGHT ATRIUM          Index LA diam:        2.70 cm 1.54 cm/m   RA Area:     5.81 cm LA Vol (A2C):   16.1 ml 9.18 ml/m   RA Volume:   8.03 ml  4.58 ml/m LA Vol (A4C):   21.1 ml 12.02 ml/m LA Biplane Vol: 19.1 ml 10.88 ml/m  AORTIC VALVE LVOT Vmax:   127.00 cm/s LVOT Vmean:  79.900 cm/s LVOT VTI:    0.122 m  AORTA Ao Root diam: 2.00 cm MITRAL VALVE MV Area (PHT): 7.33 cm    SHUNTS MV E velocity: 67.00 cm/s  Systemic VTI:  0.12 m MV A velocity: 83.20 cm/s  Systemic Diam: 1.70 cm MV E/A ratio:  0.81 Weston Brass MD Electronically signed by Weston Brass MD Signature Date/Time: 07/20/2023/4:00:52 PM    Final    DG CHEST PORT 1 VIEW Result Date: 07/20/2023 CLINICAL DATA:  Pneumomediastinum. EXAM: PORTABLE CHEST 1 VIEW COMPARISON:  July 19, 2023. FINDINGS: The heart size and mediastinal contours are within normal limits. Endotracheal and nasogastric tubes are unchanged. Right internal jugular catheter is unchanged. Mildly decreased bibasilar opacities are noted suggesting improving pneumonia or edema. The visualized skeletal structures are unremarkable. IMPRESSION: Stable support apparatus. Mildly improved bibasilar opacities as noted above. Electronically Signed   By: Lupita Raider M.D.   On: 07/20/2023 08:55      Assessment/Plan:  INTERVAL HISTORY: CT head done last night due to anisocoria observed   Principal Problem:   Group A streptococcal infection Active Problems:   Sepsis due to urinary tract infection (HCC)   Acute respiratory failure (HCC)   Altered mental status   Protein-calorie  malnutrition, severe    Chloa Manjarrez is a 42 y.o. female with  hx cervical cancer s/p chemoxrt and brachytherapy (unclear status of disease), presence of bilateral ureteral stent for distal ureteral stricture/obstructive uropathy (stents in for a year), ckd stage 4 admitted with septic shock due to Group A streptococcal bacteremia--with source undoubtedly skin. She is no on CVVHD.  She has vasculitic appearing lesions on both lower extremities  Her WBC continues to climb to above 60K  Her CT abdomen shows progression of bilateral opacities in the lungs  She had rhinovirus detected on PCR and MRSA on bronchoscopy.  She is on PCN G and zyvox  She has a history of heroin abuse and HCV +  #1 Septic shock from GAS  Skin is undoubtedly the source  I suspect she is now actively using intravenous drugs and that certainly could also be a means by which she became bacteremic  Her opacities in the lungs could be septic embolization from right sided endocarditis  Currently I think penicillin and Zyvox are quite appropriate  #2 Vasculitic lesions on legs:  Could this be due to resolved from cocaine? It was found in her UDS  Opiates were not but fentanyl would not be picked up on UDS  #3 Anisocoria: seems resolved but worrisome. SHould get further CNS imaging when able to do so  #4 Leukocytosis: worsening but I dont see a target for broader antibiotics  #5 HCV +, check HCV RNA and then if + genotype  #5 IVDU: if this is ongoing as I suspect it likely is then she would typically not be considered appropriate candidate for long term HD--at least what I have observed but would defer to Nephrology  I have personally spent 50 minutes involved in face-to-face and non-face-to-face activities for this patient on the day of the visit. Professional time spent includes the following activities: Preparing to see the patient (review of tests), Obtaining and/or reviewing separately obtained history  (admission/discharge record), Performing a medically appropriate examination and/or evaluation , Ordering medications/tests/procedures, referring and communicating with other health care professionals, Documenting clinical information in the EMR, Independently interpreting results (not separately reported), Communicating results to the patient/family/caregiver, Counseling and educating the patient/family/caregiver and Care coordination (not separately reported).          LOS: 5 days   Acey Lav 07/21/2023, 12:01 PM

## 2023-07-22 DIAGNOSIS — I776 Arteritis, unspecified: Secondary | ICD-10-CM | POA: Diagnosis not present

## 2023-07-22 DIAGNOSIS — B950 Streptococcus, group A, as the cause of diseases classified elsewhere: Secondary | ICD-10-CM | POA: Diagnosis not present

## 2023-07-22 DIAGNOSIS — R6521 Severe sepsis with septic shock: Secondary | ICD-10-CM | POA: Diagnosis not present

## 2023-07-22 DIAGNOSIS — A400 Sepsis due to streptococcus, group A: Secondary | ICD-10-CM | POA: Diagnosis not present

## 2023-07-22 LAB — GLUCOSE, CAPILLARY
Glucose-Capillary: 151 mg/dL — ABNORMAL HIGH (ref 70–99)
Glucose-Capillary: 135 mg/dL — ABNORMAL HIGH (ref 70–99)
Glucose-Capillary: 111 mg/dL — ABNORMAL HIGH (ref 70–99)
Glucose-Capillary: 111 mg/dL — ABNORMAL HIGH (ref 70–99)

## 2023-07-22 LAB — RENAL FUNCTION PANEL
Albumin: <1.5 g/dL — ABNORMAL LOW (ref 3.5–5.0)
Albumin: 1.5 g/dL — ABNORMAL LOW (ref 3.5–5.0)
Anion gap: 8 (ref 5–15)
Anion gap: 8 (ref 5–15)
BUN: 25 mg/dL — ABNORMAL HIGH (ref 6–20)
BUN: 22 mg/dL — ABNORMAL HIGH (ref 6–20)
CO2: 23 mmol/L (ref 22–32)
CO2: 24 mmol/L (ref 22–32)
Calcium: 8 mg/dL — ABNORMAL LOW (ref 8.9–10.3)
Calcium: 8.4 mg/dL — ABNORMAL LOW (ref 8.9–10.3)
Chloride: 103 mmol/L (ref 98–111)
Chloride: 100 mmol/L (ref 98–111)
Creatinine, Ser: 1.65 mg/dL — ABNORMAL HIGH (ref 0.44–1.00)
Creatinine, Ser: 1.31 mg/dL — ABNORMAL HIGH (ref 0.44–1.00)
GFR, Estimated: 39 mL/min — ABNORMAL LOW (ref >60)
GFR, Estimated: 52 mL/min — ABNORMAL LOW (ref >60)
Glucose, Bld: 148 mg/dL — ABNORMAL HIGH (ref 70–99)
Glucose, Bld: 145 mg/dL — ABNORMAL HIGH (ref 70–99)
Phosphorus: 3.3 mg/dL (ref 2.5–4.6)
Phosphorus: 3.1 mg/dL (ref 2.5–4.6)
Potassium: 5.2 mmol/L — ABNORMAL HIGH (ref 3.5–5.1)
Potassium: 5.5 mmol/L — ABNORMAL HIGH (ref 3.5–5.1)
Sodium: 134 mmol/L — ABNORMAL LOW (ref 135–145)
Sodium: 132 mmol/L — ABNORMAL LOW (ref 135–145)

## 2023-07-22 LAB — CBC
HCT: 23.8 % — ABNORMAL LOW (ref 36.0–46.0)
Hemoglobin: 7.1 g/dL — ABNORMAL LOW (ref 12.0–15.0)
MCH: 29.2 pg (ref 26.0–34.0)
MCHC: 29.8 g/dL — ABNORMAL LOW (ref 30.0–36.0)
MCV: 97.9 fL (ref 80.0–100.0)
Platelets: 251 10*3/uL (ref 150–400)
RBC: 2.43 MIL/uL — ABNORMAL LOW (ref 3.87–5.11)
RDW: 16.5 % — ABNORMAL HIGH (ref 11.5–15.5)
WBC: 46.6 10*3/uL — ABNORMAL HIGH (ref 4.0–10.5)
nRBC: 0.6 % — ABNORMAL HIGH (ref 0.0–0.2)

## 2023-07-22 LAB — HEPATIC FUNCTION PANEL
ALT: 839 U/L — ABNORMAL HIGH (ref 0–44)
AST: 2381 U/L — ABNORMAL HIGH (ref 15–41)
Albumin: <1.5 g/dL — ABNORMAL LOW (ref 3.5–5.0)
Alkaline Phosphatase: 119 U/L (ref 38–126)
Bilirubin, Direct: <0.1 mg/dL (ref 0.0–0.2)
Total Bilirubin: 0.6 mg/dL (ref <1.2)
Total Protein: 8.6 g/dL — ABNORMAL HIGH (ref 6.5–8.1)

## 2023-07-22 LAB — APTT: aPTT: 34 s (ref 24–36)

## 2023-07-22 LAB — MAGNESIUM: Magnesium: 2.5 mg/dL — ABNORMAL HIGH (ref 1.7–2.4)

## 2023-07-22 MED ORDER — PRISMASOL BGK 0/2.5 32-2.5 MEQ/L EC SOLN
Status: DC
  Filled 2023-07-22 (×7): qty 5000

## 2023-07-22 MED ORDER — OXYCODONE HCL 5 MG PO TABS
10.0000 mg | ORAL_TABLET | Freq: Four times a day (QID) | ORAL | Status: DC
  Administered 2023-07-22 – 2023-07-25 (×12): 10 mg
  Filled 2023-07-22 (×12): qty 2

## 2023-07-22 MED ORDER — PRISMASOL BGK 0/2.5 32-2.5 MEQ/L EC SOLN
Status: DC
  Filled 2023-07-22 (×8): qty 5000

## 2023-07-22 MED ORDER — HEPARIN SODIUM (PORCINE) 5000 UNIT/ML IJ SOLN
5000.0000 [IU] | Freq: Three times a day (TID) | INTRAMUSCULAR | Status: DC
Start: 2023-07-22 — End: 2023-07-25
  Administered 2023-07-22 – 2023-07-25 (×9): 5000 [IU] via SUBCUTANEOUS
  Filled 2023-07-22 (×7): qty 1

## 2023-07-22 MED ORDER — METHADONE HCL 10 MG PO TABS: 10.0000 mg | ORAL_TABLET | Freq: Three times a day (TID) | ORAL | Status: DC

## 2023-07-22 NOTE — Procedures (Signed)
Admit: 07/16/2023 LOS: 6  34F with chronic obstructive uropathy with b/l stents, hx/o cervical CA, CKD4 here with AKI, metabolic acidosis, hyperkalemia, AMS, VDRF, septic shock from ?UTI  Current CRRT Prescription: Start Date: 07/17/23 Catheter: R internal jugular Temp HD cath placed 07/16/23 CCM BFR: 200-250 Pre Blood Pump: 4K 800 DFR: 2000 4K Replacement Rate: 4K 400 Goal UF: -43mL/h Anticoagulation: fixed dose heparin Clotting: events occurring infrequently, >24h interval time  S: K improving Low dosee NE Anuric 0.6L positive yesterday Not clotting P 3.3  O: 12/21 0701 - 12/22 0700 In: 4017.4 [I.V.:1083.2; NG/GT:1575; IV Piggyback:1349.2] Out: 3393 [Urine:75; Stool:800]  Filed Weights   07/20/23 0500 07/21/23 0500 07/22/23 0500  Weight: 66.7 kg 65.2 kg 65.3 kg    Recent Labs  Lab 07/21/23 0442 07/21/23 1052 07/21/23 1604 07/22/23 0310  NA 134*  --  133* 134*  K 5.7*  5.7* 6.1* 6.1* 5.2*  CL 96*  --  99 103  CO2 25  --  21* 23  GLUCOSE 150*  --  149* 148*  BUN 42*  --  39* 25*  CREATININE 3.42*  --  2.78* 1.65*  CALCIUM 8.8*  --  8.5* 8.0*  PHOS 7.9*  --  5.9* 3.3   Recent Labs  Lab 07/16/23 1652 07/16/23 1658 07/19/23 0656 07/19/23 1431 07/20/23 0308 07/21/23 0039 07/21/23 0442 07/22/23 0308  WBC 46.1*   < > 50.0*  --  58.2*  --  62.8* 46.6*  NEUTROABS 42.0*  --  45.5*  --   --   --   --   --   HGB 10.8*   < > 6.4*   < > 8.0* 10.2* 7.8* 7.1*  HCT 36.0   < > 20.9*   < > 26.1* 30.0* 26.0* 23.8*  MCV 95.2   < > 96.8  --  95.6  --  99.2 97.9  PLT 704*   < > 229  --  243  --  271 251   < > = values in this interval not displayed.    Scheduled Meds:  sodium chloride   Intravenous Once   Chlorhexidine Gluconate Cloth  6 each Topical Daily   docusate  100 mg Per Tube BID   etomidate  0.3 mg/kg Intravenous Once   feeding supplement (PROSource TF20)  60 mL Per Tube BID   fiber supplement (BANATROL TF)  60 mL Per Tube BID   insulin aspart  0-9  Units Subcutaneous Q4H   linezolid  600 mg Per Tube Q12H   multivitamin  1 tablet Per Tube QHS   mouth rinse  15 mL Mouth Rinse Q2H   oxyCODONE  10 mg Per Tube Q6H   pantoprazole (PROTONIX) IV  40 mg Intravenous QHS   polyethylene glycol  17 g Per Tube Daily   sodium chloride flush  5 mL Intracatheter Q8H   sodium zirconium cyclosilicate  10 g Per Tube Daily   Continuous Infusions:   prismasol BGK 4/2.5 800 mL/hr at 07/22/23 0557    prismasol BGK 4/2.5 400 mL/hr at 07/21/23 2348   feeding supplement (VITAL 1.5 CAL) 50 mL/hr at 07/22/23 0900   fentaNYL infusion INTRAVENOUS 200 mcg/hr (07/22/23 0800)   heparin 10,000 units/ 20 mL infusion syringe 350 Units/hr (07/21/23 1012)   norepinephrine (LEVOPHED) Adult infusion 3 mcg/min (07/22/23 0900)   pencillin G potassium IV 100 mL/hr at 07/22/23 0900   prismasol BGK 4/2.5 2,000 mL/hr at 07/22/23 0707   propofol (DIPRIVAN) infusion 20 mcg/kg/min (07/22/23 0900)  PRN Meds:.acetaminophen, fentaNYL, heparin, heparin, midazolam, mouth rinse  ABG    Component Value Date/Time   PHART 7.225 (L) 07/21/2023 0039   PCO2ART 57.6 (H) 07/21/2023 0039   PO2ART 86 07/21/2023 0039   HCO3 23.6 07/21/2023 0039   TCO2 25 07/21/2023 0039   ACIDBASEDEF 4.0 (H) 07/21/2023 0039   O2SAT 93 07/21/2023 0039    A 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  Might be we are now ESRD.... Septic shock likely from UTI with b/l ureteral stents not exchanged on schedule; on/off pressors Strep bacteremia [facility] following on PCN MRSA PNA on linezolid Hyperkalemia, recurrent quickly after holding CRRT 07/20/23; cont 4K bath for now Metabolic acidosis, resolved Anemia Intermittent Hypophsophatemia from CRRT,  Rhinovirus PNA Polysubstance abuse Hx/o cervical cancer  P Net neg 30mL/h UF as BP permits, not at cost of inc NE Cont fixed  dose heparin Progress here seems glacial.  No rush to come off CRRT. Not sure she will do well with iHD  Arita Miss, MD  [facility]

## 2023-07-22 NOTE — Progress Notes (Signed)
NAME:  Kailyn Boaz, MRN:  [id number], DOB:  [birth year], LOS: 6 ADMISSION DATE:  07/16/2023, CONSULTATION DATE:  07/16/23 REFERRING MD:  [place]-EDP, CHIEF COMPLAINT:  Sepsis, presumed urosepsis   History of present illness   42 year old woman who presented to [facility] ED 12/16 for unresponsiveness. PMHx significant for CKD stage 4, bilateral ureteral obstruction (s/p stent placement 10/2022), cervical CA stage IIB (s/p chemotherapy/XRT, followed at [facility]), polysubstance abuse (current heroin use via inhalation).   Patient presented to [facility] ED with decreased responsiveness x 3 days, per patient's husband (at bedside). Has not been feeling well for about a month but is often hesitant to come to the ED and was adamantly refusing going to the hospital. PO intake has been poor, has been only drinking [facility] and not eating much. No reported fevers at home, but chills/general malaise. Intermittent nausea, no vomiting/diarrhea. Per husband, has complained of abdominal pain/pain with urination. Of note, has bilateral ureteral stents placed in 03/2022; she was supposed to follow up for stent exchange 01/2023 but did not present as scheduled. Additionally, husband notes she continues to use heroin via inhalation, denies injection.   On ED arrival, patient was afebrile, HR 111, BP 126/80, SpO2 100%. She was initially somnolent with GCS 12 but intermittently agitated/not following command, thrashing and causing risk to herself/staff. Ultimately patient was intubated for her safety/airway protection. Labs were notable for WBC 46.1, Hgb 10.8, Plt 704 (suspect concentrated), Na 130, K 5.7, CO2 < 7, BUN 158/Cr 12.13 (baseline ~4). Transaminases/Tbili WNL, Alk Phos mildly elevated. Lipase 133. TSH 4.046. LA 6.1 > 0.8 after IV fluids. CK 198. Ammonia 79, Ethanol < 10, APAP/salicylates negative. UDS pending. VBG 6.974/pCO2 23.4/bicarb 5.4. PCT 29.14, MRSA PCR positive (nares). BCx pending, cefepime/vanc initiated. CT  Chest/A/P demonstrated bilateral centrilobular micronodules (tree-in-bud) greatest in bilateral lower lobes, small amount pneumomediastinum (history of esophageal perf), thick-walled nondistended urinary bladder with Foley, bilateral hydronephrosis with bilateral ureteral stents.   PCCM consulted for ICU admission. Nephro consulted for ARF and emergent CRRT initiation.  Past Medical History  CKD stage IV Bilateral ureteral obstruction status post stent placement April 2024 Cervical cancer stage IIb s/p chemotherapy and radiation Heroin use  Significant Hospital Events   12/16 - Presented to [facility] ED with unresponsiveness, decreased PO intake. Unresponsiveness with intermittent agitation requiring intubation. Found to be in acute renal failure with Cr 12 (baseline ~4), BUN > 100. WBC 46. Briefly required Levophed prior to intubation. Broad-spectrum antibiotic started (cefepime/vanc). RIJ Trialysis catheter placed. CRRT started per Nephro. 12/17- IR placed bilateral percutaneous nephrostomy tubes and removed bilateral ureteral stents 12/19 started enteral methodone 12/20 did not tolerate coming of CRRT for a trial of iHD due to electrolyte derrangements  Interim history/subjective:  Still on CRRT. On norepi. Pulling net negative 50.   Objective   Blood pressure 103/70, pulse (!) 105, temperature 98.5 F (36.9 C), temperature source Axillary, resp. rate (!) 31, height 5\' 6"  (1.676 m), weight 65.3 kg, SpO2 99%.    Vent Mode: PRVC FiO2 (%):  [40 %] 40 % Set Rate:  [32 bmp] 32 bmp Vt Set:  [470 mL] 470 mL PEEP:  [5 cmH20] 5 cmH20 Plateau Pressure:  [17 cmH20-22 cmH20] 19 cmH20   Intake/Output Summary (Last 24 hours) at 07/22/2023 0829 Last data filed at 07/22/2023 0800 Gross per 24 hour  Intake 4065.75 ml  Output 3531.2 ml  Net 534.55 ml    Filed Weights   07/20/23 0500 07/21/23 0500 07/22/23 0500  Weight:  66.7 kg 65.2 kg 65.3 kg    Examination: Gen:      Intubated, sedated,  acutely and chronically ill appearing HEENT:  ETT to vent Lungs:    sounds of mechanical ventilation auscultated no wheeze CV:         tachycardic, regular Abd:      + bowel sounds; soft, non-tender; no palpable masses, no distension Ext:    No edema Skin:      Warm and dry; no rashes Neuro:   sedated, RASS -3, moves all 4 extremities    Labs/imaging reviewed  Na 134 K 5.2 Cr 103 Glucose 140180 Cr 1.65 Albumin <1.5 AST 2381 ALT 839 WBC 46.6 Hgb 7.1  Resolved Hospital Problem list   Severe metabolic acidosis Uremia Hyperkalemia  Assessment & Plan:  42 y.o. year old female with pmh of cervical cancer s/p chemoradiation complicated by ureteral stenosis with stent placement, heroin abuse who present altered mental status and admitted to ICU 12/16 for septic shock.   Septic Shock Group A strep bacteremia, Bilateral lower extremity wounds Rhinovirus/MRSA pneumonia History of bilateral ureteral obstruction s/p nephrostomy tube placement Leukocytosis -continue penicillin G and linezolid -strict I and Os -CT A/P obtained as well as repeat blood cultures negative due to persistent shock and leukocytosis. Both negative thus far. C. Diff testing also negative.  - appreciate ID assistance  Oliguric Acute renal failure, dependent on RRT CKD stage IV - continue crrt per nephrology. Ideal outcome is renal recovery as she is a poor long term dialysis candidate with IVDU prior to admission. -trend RFP this PM -strict I and Os -avoid nephrotoxic agents  Acute hypoxic respiratory failure Concern for aspiration pneumonitis Intubated for airway protection. Decreasing sedation today. Increased work of breathing when switched to pressure support -Full vent support (4-8cc/kg IBWW) -wean FiO2 for O2 sat>90% -Daily WUA/SBT once appropriate from a mental status standpoint  -VAP bundle -PAD protocol: Propofol and fentanyl RASS -1 to -2 -triglyceride while on propofol  History of polysubstance  abuse with current heroin use, inhalation Husband endorses heroin use. High risk for withdrawal symptoms.  -UDS positive for cocaine -increase methadone to 10 mg TID  Diabetes - cbgs at goal  Small volume pneumomediastinum Small amount of pneumomediastinum seen on CT chest 12/16. Has history of esophageal perforation and inhales heroin. Repeat CXR 12/20 without abnormabilities. - Monitor closely for now, repeat chest xray if clinically indicated  History of cervical cancer Stage IIb, status post chemoradiation diagnosed in 2011.  Followed with Dr. Bertis Ruddy at [facility], last seen 3/23 with recommendations for CT abd/pelvis with 1/23 CT showing lymphadenopathy. CT 07/16/23 with similar prominent retroperitoneal and decreased inguinal lymph nodes. -continue outpatient follow-up  Best practice:  Diet: NPO, tube feeds Pain/Anxiety/Delirium protocol (if indicated): propofol, fentanyl DVT prophylaxis: Heparin GI prophylaxis: PPI Glucose control: SSI Code Status: Full Family Communication: husband at bedside, updated 07/20/23  The patient is critically ill due to shock, respiratory failure, AKI.  Critical care was necessary to treat or prevent imminent or life-threatening deterioration.  Critical care was time spent personally by me on the following activities: development of treatment plan with patient and/or surrogate as well as nursing, discussions with consultants, evaluation of patient's response to treatment, examination of patient, obtaining history from patient or surrogate, ordering and performing treatments and interventions, ordering and review of laboratory studies, ordering and review of radiographic studies, pulse oximetry, re-evaluation of patient's condition and participation in multidisciplinary rounds.   Critical Care Time devoted to patient care  services described in this note is 32 minutes. This time reflects time of care of this signee Charlott Holler . This critical care time does  not reflect separately billable procedures or procedure time, teaching time or supervisory time of PA/NP/Med student/Med Resident etc but could involve care discussion time.       Charlott Holler [facility] 07/22/2023 8:29 AM  Pager: see AMION  If no response to pager , please call critical care on call (see AMION) until 7pm After 7:00 pm call Elink

## 2023-07-22 NOTE — Progress Notes (Signed)
Subjective:  Intubated and sedated  Antibiotics:  Anti-infectives (From admission, onward)    Start     Dose/Rate Route Frequency Ordered Stop   07/21/23 1200  penicillin G potassium 3 Million Units in dextrose 50mL IVPB        3 Million Units 100 mL/hr over 30 Minutes Intravenous Every 4 hours 07/21/23 0928     07/21/23 0800  penicillin G potassium 2 Million Units in dextrose 5 % 50 mL IVPB  Status:  Discontinued        2 Million Units 100 mL/hr over 30 Minutes Intravenous Every 4 hours 07/21/23 0703 07/21/23 0928   07/20/23 2200  linezolid (ZYVOX) tablet 600 mg        600 mg Per Tube Every 12 hours 07/20/23 1055     07/19/23 1800  penicillin G potassium 4 Million Units in dextrose 5 % 250 mL IVPB  Status:  Discontinued        4 Million Units 250 mL/hr over 60 Minutes Intravenous Every 6 hours 07/19/23 1344 07/19/23 1347   07/19/23 1600  penicillin G potassium 3 Million Units in dextrose 50mL IVPB  Status:  Discontinued        3 Million Units 100 mL/hr over 30 Minutes Intravenous Every 4 hours 07/19/23 1347 07/21/23 0703   07/17/23 2000  ceFEPIme (MAXIPIME) 1 g in sodium chloride 0.9 % 100 mL IVPB  Status:  Discontinued        1 g 200 mL/hr over 30 Minutes Intravenous Every 24 hours 07/16/23 2219 07/17/23 0146   07/17/23 1800  vancomycin (VANCOCIN) IVPB 1000 mg/200 mL premix  Status:  Discontinued        1,000 mg 200 mL/hr over 60 Minutes Intravenous Every 24 hours 07/17/23 0147 07/17/23 1319   07/17/23 1445  metroNIDAZOLE (FLAGYL) IVPB 500 mg  Status:  Discontinued        500 mg 100 mL/hr over 60 Minutes Intravenous Every 12 hours 07/17/23 1353 07/18/23 1706   07/17/23 1415  linezolid (ZYVOX) IVPB 600 mg  Status:  Discontinued        600 mg 300 mL/hr over 60 Minutes Intravenous Every 12 hours 07/17/23 1319 07/20/23 1055   07/17/23 0600  ceFEPIme (MAXIPIME) 2 g in sodium chloride 0.9 % 100 mL IVPB  Status:  Discontinued        2 g 200 mL/hr over 30 Minutes  Intravenous Every 12 hours 07/17/23 0146 07/19/23 1344   07/16/23 1700  ceFEPIme (MAXIPIME) 2 g in sodium chloride 0.9 % 100 mL IVPB        2 g 200 mL/hr over 30 Minutes Intravenous  Once 07/16/23 1649 07/16/23 1754   07/16/23 1700  Vancomycin (VANCOCIN) 1,500 mg in sodium chloride 0.9 % 500 mL IVPB        1,500 mg 250 mL/hr over 120 Minutes Intravenous  Once 07/16/23 1653 07/16/23 2001       Medications: Scheduled Meds:  sodium chloride   Intravenous Once   Chlorhexidine Gluconate Cloth  6 each Topical Daily   etomidate  0.3 mg/kg Intravenous Once   feeding supplement (PROSource TF20)  60 mL Per Tube BID   fiber supplement (BANATROL TF)  60 mL Per Tube BID   heparin injection (subcutaneous)  5,000 Units Subcutaneous Q8H   insulin aspart  0-9 Units Subcutaneous Q4H   linezolid  600 mg Per Tube Q12H   multivitamin  1 tablet Per Tube QHS   mouth  rinse  15 mL Mouth Rinse Q2H   oxyCODONE  10 mg Per Tube Q6H   pantoprazole (PROTONIX) IV  40 mg Intravenous QHS   sodium chloride flush  5 mL Intracatheter Q8H   Continuous Infusions:   prismasol BGK 4/2.5 800 mL/hr at 07/22/23 1212    prismasol BGK 4/2.5 400 mL/hr at 07/22/23 1212   feeding supplement (VITAL 1.5 CAL) 50 mL/hr at 07/22/23 1200   fentaNYL infusion INTRAVENOUS 175 mcg/hr (07/22/23 1200)   heparin 10,000 units/ 20 mL infusion syringe 350 Units/hr (07/22/23 0941)   norepinephrine (LEVOPHED) Adult infusion Stopped (07/22/23 1027)   pencillin G potassium IV 3 Million Units (07/22/23 1205)   prismasol BGK 4/2.5 2,000 mL/hr at 07/22/23 0942   propofol (DIPRIVAN) infusion 30 mcg/kg/min (07/22/23 1208)   PRN Meds:.acetaminophen, fentaNYL, heparin, heparin, midazolam, mouth rinse    Objective: Weight change: 0.1 kg  Intake/Output Summary (Last 24 hours) at 07/22/2023 1218 Last data filed at 07/22/2023 1200 Gross per 24 hour  Intake 3922.21 ml  Output 3851.2 ml  Net 71.01 ml   Blood pressure 104/69, pulse (!) 114,  temperature 97.8 F (36.6 C), temperature source Axillary, resp. rate (!) 25, height 5\' 6"  (1.676 m), weight 65.3 kg, SpO2 100%. Temp:  [97.3 F (36.3 C)-98.7 F (37.1 C)] 97.8 F (36.6 C) (12/22 1110) Pulse Rate:  [98-115] 114 (12/22 1145) Resp:  [19-39] 25 (12/22 1145) BP: (64-149)/(27-122) 104/69 (12/22 1145) SpO2:  [96 %-100 %] 100 % (12/22 1145) FiO2 (%):  [40 %] 40 % (12/22 1055) Weight:  [65.3 kg] 65.3 kg (12/22 0500)  Physical Exam: Physical Exam Constitutional:      Appearance: She is ill-appearing.     Interventions: She is intubated.  HENT:     Head: Normocephalic and atraumatic.     Right Ear: External ear normal.     Left Ear: External ear normal.  Eyes:     General: No scleral icterus.    Conjunctiva/sclera: Conjunctivae normal.  Cardiovascular:     Rate and Rhythm: Regular rhythm. Tachycardia present.  Pulmonary:     Effort: She is intubated.     Breath sounds: Rhonchi present.  Abdominal:     General: Bowel sounds are normal.     Palpations: Abdomen is soft.  Lymphadenopathy:     Cervical: No cervical adenopathy.  Skin:    General: Skin is warm and dry.     Coloration: Skin is not pale.     Findings: No erythema or rash.  Neurological:     General: No focal deficit present.     Mental Status: She is oriented to person, place, and time.     Motor: No abnormal muscle tone.     Coordination: Coordination normal.  Psychiatric:        Mood and Affect: Mood normal.        Behavior: Behavior normal.        Thought Content: Thought content normal.        Judgment: Judgment normal.     Lower extremities wrapped  Lower extremities 07/21/2023:       CBC:    BMET Recent Labs    07/21/23 1604 07/22/23 0310  NA 133* 134*  K 6.1* 5.2*  CL 99 103  CO2 21* 23  GLUCOSE 149* 148*  BUN 39* 25*  CREATININE 2.78* 1.65*  CALCIUM 8.5* 8.0*     Liver Panel  Recent Labs    07/21/23 0442 07/21/23 1604 07/22/23 0308 07/22/23 0310  PROT 8.6*   --  8.6*  --   ALBUMIN 1.6*  1.6*   < > <1.5* <1.5*  AST 451*  --  2,381*  --   ALT 161*  --  839*  --   ALKPHOS 123  --  119  --   BILITOT 0.7  --  0.6  --   BILIDIR <0.1  --  <0.1  --   IBILI NOT CALCULATED  --  NOT CALCULATED  --    < > = values in this interval not displayed.       Sedimentation Rate No results for input(s): "ESRSEDRATE" in the last 72 hours. C-Reactive Protein No results for input(s): "CRP" in the last 72 hours.  Micro Results: Recent Results (from the past 720 hours)  Resp panel by RT-PCR (RSV, Flu A&B, Covid) Anterior Nasal Swab     Status: None   Collection Time: 07/16/23  4:49 PM   Specimen: Anterior Nasal Swab  Result Value Ref Range Status   SARS Coronavirus 2 by RT PCR NEGATIVE NEGATIVE Final   Influenza A by PCR NEGATIVE NEGATIVE Final   Influenza B by PCR NEGATIVE NEGATIVE Final    Comment: (NOTE) The Xpert Xpress SARS-CoV-2/FLU/RSV plus assay is intended as an aid in the diagnosis of influenza from Nasopharyngeal swab specimens and should not be used as a sole basis for treatment. Nasal washings and aspirates are unacceptable for Xpert Xpress SARS-CoV-2/FLU/RSV testing.  Fact Sheet for Patients: BloggerCourse.com  Fact Sheet for Healthcare Providers: SeriousBroker.it  This test is not yet approved or cleared by the [place] FDA and has been authorized for detection and/or diagnosis of SARS-CoV-2 by FDA under an Emergency Use Authorization (EUA). This EUA will remain in effect (meaning this test can be used) for the duration of the COVID-19 declaration under Section 564(b)(1) of the Act, 21 U.S.C. section 360bbb-3(b)(1), unless the authorization is terminated or revoked.     Resp Syncytial Virus by PCR NEGATIVE NEGATIVE Final    Comment: (NOTE) Fact Sheet for Patients: BloggerCourse.com  Fact Sheet for Healthcare  Providers: SeriousBroker.it  This test is not yet approved or cleared by the [place] FDA and has been authorized for detection and/or diagnosis of SARS-CoV-2 by FDA under an Emergency Use Authorization (EUA). This EUA will remain in effect (meaning this test can be used) for the duration of the COVID-19 declaration under Section 564(b)(1) of the Act, 21 U.S.C. section 360bbb-3(b)(1), unless the authorization is terminated or revoked.  Performed at [facility], [address], [place], [place] [address]   Blood culture (routine x 2)     Status: Abnormal   Collection Time: 07/16/23  4:50 PM   Specimen: BLOOD  Result Value Ref Range Status   Specimen Description BLOOD SITE NOT SPECIFIED  Final   Special Requests   Final    BOTTLES DRAWN AEROBIC ONLY Blood Culture adequate volume   Culture  Setup Time   Final    GRAM POSITIVE COCCI IN CHAINS ANAEROBIC BOTTLE ONLY CRITICAL RESULT CALLED TO, READ BACK BY AND VERIFIED WITH: PHARMD ELIZABETH M. [id number] FCP GRAM POSITIVE COCCI IN CLUSTERS BOTTLES DRAWN AEROBIC ONLY CRITICAL RESULT CALLED TO, READ BACK BY AND VERIFIED WITH: PHARMD JESSICA MILLEN ON 07/17/23 @ 1723 BY DRT    Culture (A)  Final    STREPTOCOCCUS PYOGENES STAPHYLOCOCCUS EPIDERMIDIS THE SIGNIFICANCE OF ISOLATING THIS ORGANISM FROM A SINGLE SET OF BLOOD CULTURES WHEN MULTIPLE SETS ARE DRAWN IS UNCERTAIN. PLEASE NOTIFY  THE MICROBIOLOGY DEPARTMENT WITHIN ONE WEEK IF SPECIATION AND SENSITIVITIES ARE REQUIRED. HEALTH DEPARTMENT NOTIFIED Performed at [facility], [address] [place], [place], [place] [address]    Report Status 07/19/2023 FINAL  Final   Organism ID, Bacteria STREPTOCOCCUS PYOGENES  Final      Susceptibility   Streptococcus pyogenes - MIC*    PENICILLIN <=0.06 SENSITIVE Sensitive     CEFTRIAXONE <=0.12 SENSITIVE Sensitive     ERYTHROMYCIN <=0.12 SENSITIVE Sensitive     LEVOFLOXACIN 2 SENSITIVE Sensitive     VANCOMYCIN  0.5 SENSITIVE Sensitive     * STREPTOCOCCUS PYOGENES  Blood Culture ID Panel (Reflexed)     Status: Abnormal   Collection Time: 07/16/23  4:50 PM  Result Value Ref Range Status   Enterococcus faecalis NOT DETECTED NOT DETECTED Final   Enterococcus Faecium NOT DETECTED NOT DETECTED Final   Listeria monocytogenes NOT DETECTED NOT DETECTED Final   Staphylococcus species NOT DETECTED NOT DETECTED Final   Staphylococcus aureus (BCID) NOT DETECTED NOT DETECTED Final   Staphylococcus epidermidis NOT DETECTED NOT DETECTED Final   Staphylococcus lugdunensis NOT DETECTED NOT DETECTED Final   Streptococcus species DETECTED (A) NOT DETECTED Final    Comment: CRITICAL RESULT CALLED TO, READ BACK BY AND VERIFIED WITH: PHARMD ELIZABETH M. [id number] FCP    Streptococcus agalactiae NOT DETECTED NOT DETECTED Final   Streptococcus pneumoniae NOT DETECTED NOT DETECTED Final   Streptococcus pyogenes DETECTED (A) NOT DETECTED Final    Comment: CRITICAL RESULT CALLED TO, READ BACK BY AND VERIFIED WITH: PHARMD ELIZABETH M. [id number] FCP    A.calcoaceticus-baumannii NOT DETECTED NOT DETECTED Final   Bacteroides fragilis NOT DETECTED NOT DETECTED Final   Enterobacterales NOT DETECTED NOT DETECTED Final   Enterobacter cloacae complex NOT DETECTED NOT DETECTED Final   Escherichia coli NOT DETECTED NOT DETECTED Final   Klebsiella aerogenes NOT DETECTED NOT DETECTED Final   Klebsiella oxytoca NOT DETECTED NOT DETECTED Final   Klebsiella pneumoniae NOT DETECTED NOT DETECTED Final   Proteus species NOT DETECTED NOT DETECTED Final   Salmonella species NOT DETECTED NOT DETECTED Final   Serratia marcescens NOT DETECTED NOT DETECTED Final   Haemophilus influenzae NOT DETECTED NOT DETECTED Final   Neisseria meningitidis NOT DETECTED NOT DETECTED Final   Pseudomonas aeruginosa NOT DETECTED NOT DETECTED Final   Stenotrophomonas maltophilia NOT DETECTED NOT DETECTED Final   Candida albicans NOT DETECTED NOT  DETECTED Final   Candida auris NOT DETECTED NOT DETECTED Final   Candida glabrata NOT DETECTED NOT DETECTED Final   Candida krusei NOT DETECTED NOT DETECTED Final   Candida parapsilosis NOT DETECTED NOT DETECTED Final   Candida tropicalis NOT DETECTED NOT DETECTED Final   Cryptococcus neoformans/gattii NOT DETECTED NOT DETECTED Final    Comment: Performed at [facility], [address], [place], [place] [address]  MRSA Next Gen by PCR, Nasal     Status: Abnormal   Collection Time: 07/16/23  9:37 PM   Specimen: Nasal Mucosa; Nasal Swab  Result Value Ref Range Status   MRSA by PCR Next Gen DETECTED (A) NOT DETECTED Final    Comment: RESULT CALLED TO, READ BACK BY AND VERIFIED WITH: B SCOTT  RN 07/16/2023 @ 2325 BY AB (NOTE) The GeneXpert MRSA Assay (FDA approved for NASAL specimens only), is one component of a comprehensive MRSA colonization surveillance program. It is not intended to diagnose MRSA infection nor to guide or monitor treatment for MRSA infections. Test performance is not FDA approved  in patients less than 62 years old. Performed at [facility], [address], [place], [place] [address]   Blood culture (routine x 2)     Status: None   Collection Time: 07/16/23 10:36 PM   Specimen: BLOOD RIGHT HAND  Result Value Ref Range Status   Specimen Description BLOOD RIGHT HAND  Final   Special Requests   Final    BOTTLES DRAWN AEROBIC AND ANAEROBIC Blood Culture results may not be optimal due to an inadequate volume of blood received in culture bottles   Culture   Final    NO GROWTH 5 DAYS Performed at [facility], [address], [place], [place] [address]    Report Status 07/21/2023 FINAL  Final  Urine Culture (for pregnant, neutropenic or urologic patients or patients with an indwelling urinary catheter)     Status: Abnormal   Collection Time: 07/17/23  7:33 AM   Specimen: Urine, Catheterized  Result Value Ref Range Status   Specimen Description  URINE, CATHETERIZED  Final   Special Requests   Final    NONE Performed at [facility], [address] [place], [place] [address]    Culture MULTIPLE SPECIES PRESENT, SUGGEST RECOLLECTION (A)  Final   Report Status 07/18/2023 FINAL  Final  Respiratory (~20 pathogens) panel by PCR     Status: Abnormal   Collection Time: 07/17/23 10:58 AM   Specimen: Nasopharyngeal Swab; Respiratory  Result Value Ref Range Status   Adenovirus NOT DETECTED NOT DETECTED Final   Coronavirus 229E NOT DETECTED NOT DETECTED Final    Comment: (NOTE) The Coronavirus on the Respiratory Panel, DOES NOT test for the novel  Coronavirus (2019 nCoV)    Coronavirus HKU1 NOT DETECTED NOT DETECTED Final   Coronavirus NL63 NOT DETECTED NOT DETECTED Final   Coronavirus OC43 NOT DETECTED NOT DETECTED Final   Metapneumovirus NOT DETECTED NOT DETECTED Final   Rhinovirus / Enterovirus DETECTED (A) NOT DETECTED Final   Influenza A NOT DETECTED NOT DETECTED Final   Influenza B NOT DETECTED NOT DETECTED Final   Parainfluenza Virus 1 NOT DETECTED NOT DETECTED Final   Parainfluenza Virus 2 NOT DETECTED NOT DETECTED Final   Parainfluenza Virus 3 NOT DETECTED NOT DETECTED Final   Parainfluenza Virus 4 NOT DETECTED NOT DETECTED Final   Respiratory Syncytial Virus NOT DETECTED NOT DETECTED Final   Bordetella pertussis NOT DETECTED NOT DETECTED Final   Bordetella Parapertussis NOT DETECTED NOT DETECTED Final   Chlamydophila pneumoniae NOT DETECTED NOT DETECTED Final   Mycoplasma pneumoniae NOT DETECTED NOT DETECTED Final    Comment: Performed at [facility], [address], [place], [place] [address]  Culture, Respiratory w Gram Stain     Status: None   Collection Time: 07/17/23 11:14 AM   Specimen: Bronchoalveolar Lavage; Respiratory  Result Value Ref Range Status   Specimen Description BRONCHIAL ALVEOLAR LAVAGE  Final   Special Requests Normal  Final   Gram Stain   Final    ABUNDANT WBC PRESENT, PREDOMINANTLY  PMN NO ORGANISMS SEEN Performed at [facility], [address] [place], [place] [address]    Culture RARE METHICILLIN RESISTANT STAPHYLOCOCCUS AUREUS  Final   Report Status 07/19/2023 FINAL  Final   Organism ID, Bacteria METHICILLIN RESISTANT STAPHYLOCOCCUS AUREUS  Final      Susceptibility   Methicillin resistant staphylococcus aureus - MIC*    CIPROFLOXACIN 4 RESISTANT Resistant     ERYTHROMYCIN >=8 RESISTANT Resistant  GENTAMICIN <=0.5 SENSITIVE Sensitive     OXACILLIN >=4 RESISTANT Resistant     TETRACYCLINE <=1 SENSITIVE Sensitive     VANCOMYCIN 1 SENSITIVE Sensitive     TRIMETH/SULFA 160 RESISTANT Resistant     CLINDAMYCIN <=0.25 SENSITIVE Sensitive     RIFAMPIN <=0.5 SENSITIVE Sensitive     Inducible Clindamycin NEGATIVE Sensitive     LINEZOLID 2 SENSITIVE Sensitive     * RARE METHICILLIN RESISTANT STAPHYLOCOCCUS AUREUS  Acid Fast Smear (AFB)     Status: None   Collection Time: 07/17/23 11:14 AM   Specimen: Bronchoalveolar Lavage; Respiratory  Result Value Ref Range Status   AFB Specimen Processing Concentration  Final   Acid Fast Smear Negative  Final    Comment: (NOTE) Performed At: [facility] [address] [place], [place] [id number] Jolene Schimke MD WU:[id number]    Source (AFB) BRONCHIAL ALVEOLAR LAVAGE  Final    Comment: Performed at [facility], [address], [place], [place] [address]  Blood Culture ID Panel (Reflexed)     Status: Abnormal   Collection Time: 07/17/23  6:57 PM  Result Value Ref Range Status   Enterococcus faecalis NOT DETECTED NOT DETECTED Final   Enterococcus Faecium NOT DETECTED NOT DETECTED Final   Listeria monocytogenes NOT DETECTED NOT DETECTED Final   Staphylococcus species DETECTED (A) NOT DETECTED Final    Comment: CRITICAL RESULT CALLED TO, READ BACK BY AND VERIFIED WITH: PHARMD JESSICA MILLEN ON 07/17/23 @ 1700 BY DRT    Staphylococcus aureus (BCID) NOT DETECTED NOT DETECTED Final   Staphylococcus  epidermidis DETECTED (A) NOT DETECTED Final    Comment: Methicillin (oxacillin) resistant coagulase negative staphylococcus. Possible blood culture contaminant (unless isolated from more than one blood culture draw or clinical case suggests pathogenicity). No antibiotic treatment is indicated for blood  culture contaminants. PHARMD JESSICA MILLEN ON 07/17/23 @ 1700 BY DRT    Staphylococcus lugdunensis NOT DETECTED NOT DETECTED Final   Streptococcus species NOT DETECTED NOT DETECTED Final   Streptococcus agalactiae NOT DETECTED NOT DETECTED Final   Streptococcus pneumoniae NOT DETECTED NOT DETECTED Final   Streptococcus pyogenes NOT DETECTED NOT DETECTED Final   A.calcoaceticus-baumannii NOT DETECTED NOT DETECTED Final   Bacteroides fragilis NOT DETECTED NOT DETECTED Final   Enterobacterales NOT DETECTED NOT DETECTED Final   Enterobacter cloacae complex NOT DETECTED NOT DETECTED Final   Escherichia coli NOT DETECTED NOT DETECTED Final   Klebsiella aerogenes NOT DETECTED NOT DETECTED Final   Klebsiella oxytoca NOT DETECTED NOT DETECTED Final   Klebsiella pneumoniae NOT DETECTED NOT DETECTED Final   Proteus species NOT DETECTED NOT DETECTED Final   Salmonella species NOT DETECTED NOT DETECTED Final   Serratia marcescens NOT DETECTED NOT DETECTED Final   Haemophilus influenzae NOT DETECTED NOT DETECTED Final   Neisseria meningitidis NOT DETECTED NOT DETECTED Final   Pseudomonas aeruginosa NOT DETECTED NOT DETECTED Final   Stenotrophomonas maltophilia NOT DETECTED NOT DETECTED Final   Candida albicans NOT DETECTED NOT DETECTED Final   Candida auris NOT DETECTED NOT DETECTED Final   Candida glabrata NOT DETECTED NOT DETECTED Final   Candida krusei NOT DETECTED NOT DETECTED Final   Candida parapsilosis NOT DETECTED NOT DETECTED Final   Candida tropicalis NOT DETECTED NOT DETECTED Final   Cryptococcus neoformans/gattii NOT DETECTED NOT DETECTED Final   Methicillin resistance mecA/C DETECTED  (A) NOT DETECTED Final    Comment: CRITICAL RESULT CALLED TO, READ BACK BY AND VERIFIED WITH: PHARMD JESSICA MILLEN ON 07/17/23 @ 1700 BY  DRT Performed at [facility], [address] [place], [place] [address]   C Difficile Quick Screen (NO PCR Reflex)     Status: None   Collection Time: 07/20/23  7:27 AM   Specimen: STOOL  Result Value Ref Range Status   C Diff antigen NEGATIVE NEGATIVE Final   C Diff toxin NEGATIVE NEGATIVE Final   C Diff interpretation No C. difficile detected.  Final    Comment: Performed at [facility], [address] [place], [place] [address]  Culture, blood (Routine X 2) w Reflex to ID Panel     Status: None (Preliminary result)   Collection Time: 07/20/23  9:42 AM   Specimen: BLOOD RIGHT ARM  Result Value Ref Range Status   Specimen Description BLOOD RIGHT ARM  Final   Special Requests   Final    AEROBIC BOTTLE ONLY Blood Culture results may not be optimal due to an inadequate volume of blood received in culture bottles   Culture   Final    NO GROWTH 2 DAYS Performed at [facility], [address], [place], [place] [address]    Report Status PENDING  Incomplete  Culture, blood (Routine X 2) w Reflex to ID Panel     Status: None (Preliminary result)   Collection Time: 07/20/23  9:44 AM   Specimen: BLOOD LEFT HAND  Result Value Ref Range Status   Specimen Description BLOOD LEFT HAND  Final   Special Requests   Final    BOTTLES DRAWN AEROBIC AND ANAEROBIC Blood Culture adequate volume   Culture   Final    NO GROWTH 2 DAYS Performed at [facility], [address] [place], [place] [address]    Report Status PENDING  Incomplete    Studies/Results: DG Chest Port 1 View Result Date: 07/21/2023 CLINICAL DATA:  Respiratory distress EXAM: PORTABLE CHEST 1 VIEW COMPARISON:  07/20/2023 FINDINGS: Endotracheal tube tip measures about 2 cm above the carina. Enteric tube tip projects over the left upper quadrant consistent with  location in the upper stomach. Right central venous catheter tip projects over the cavoatrial junction region. No pneumothorax. Shallow inspiration. Heart size and pulmonary vascularity are normal. No airspace disease or consolidation in the lungs. No pleural effusions. No pneumothorax. Mediastinal contours appear intact. Pigtail catheter is demonstrated in the upper abdomen, likely nephrostomy tubes. IMPRESSION: Appliances appear in satisfactory position. No evidence of active pulmonary disease. Electronically Signed   By: Burman Nieves M.D.   On: 07/21/2023 01:31   CT HEAD WO CONTRAST ( ) Result Date: 07/21/2023 CLINICAL DATA:  Mental status change, persistent or worsening over the last few hours. Fever and tachycardia. EXAM: CT HEAD WITHOUT CONTRAST TECHNIQUE: Contiguous axial images were obtained from the base of the skull through the vertex without intravenous contrast. RADIATION DOSE REDUCTION: This exam was performed according to the departmental dose-optimization program which includes automated exposure control, adjustment of the mA and/or kV according to patient size and/or use of iterative reconstruction technique. COMPARISON:  07/16/2023 FINDINGS: Brain: No evidence of acute infarction, hemorrhage, hydrocephalus, extra-axial collection or mass lesion/mass effect. Vascular: No hyperdense vessel or unexpected calcification. Skull: Normal. Negative for fracture or focal lesion. Sinuses/Orbits: Mucosal thickening throughout the paranasal sinuses. No acute air-fluid levels. Mastoid air cells are clear. Other: None. IMPRESSION: No acute intracranial abnormalities. Mucosal thickening in the paranasal sinuses consistent with chronic inflammation. Similar appearance to previous study. Electronically Signed   By: Marisa Cyphers.D.  On: 07/21/2023 01:30   CT ABDOMEN PELVIS WO CONTRAST Result Date: 07/20/2023 CLINICAL DATA:  Sepsis and leukocytosis. EXAM: CT ABDOMEN AND PELVIS WITHOUT CONTRAST  TECHNIQUE: Multidetector CT imaging of the abdomen and pelvis was performed following the standard protocol without IV contrast. RADIATION DOSE REDUCTION: This exam was performed according to the departmental dose-optimization program which includes automated exposure control, adjustment of the mA and/or kV according to patient size and/or use of iterative reconstruction technique. COMPARISON:  CT of the chest abdomen pelvis dated 07/16/2023. FINDINGS: Evaluation of this exam is limited in the absence of intravenous contrast. Lower chest: Partially visualized bilateral lower lobe nodular densities and areas of consolidation, progressed since the prior CT consistent with pneumonia or aspiration. No intra-abdominal free air or free fluid. Hepatobiliary: The liver is unremarkable. No biliary ductal dilatation. Cholecystectomy. No retained calcified stone noted in the central CBD. Pancreas: Unremarkable. No pancreatic ductal dilatation or surrounding inflammatory changes. Spleen: Normal in size without focal abnormality. Adrenals/Urinary Tract: The adrenal glands are unremarkable. Bilateral percutaneous nephrostomies. The pigtail tip of the left nephrostomy tube is in the region of the renal pelvis and the tip of the right tube is in the inferior pole collecting system. Small pockets of air in the collecting system introduced via nephrostomy. Interval removal of the ureteral stents. No hydronephrosis. The urinary bladder is minimally distended and grossly unremarkable. Stomach/Bowel: Enteric tube with tip in the proximal stomach. A rectal tube is in place. There is loose content in the colon. There is no bowel obstruction. The appendix is normal. Vascular/Lymphatic: Mild aortoiliac atherosclerotic disease. The IVC is unremarkable. No portal venous gas. There is no adenopathy. Reproductive: The uterus is grossly unremarkable. No suspicious adnexal masses. Other: None Musculoskeletal: No acute or significant osseous  findings. IMPRESSION: 1. Bilateral percutaneous nephrostomies. No hydronephrosis. 2. Progression of bilateral pulmonary opacities. 3.  Aortic Atherosclerosis (ICD10-I70.0). Electronically Signed   By: Elgie Collard M.D.   On: 07/20/2023 17:12      Assessment/Plan:  INTERVAL HISTORY:   WBC trending down to 40K range now  Principal Problem:   Group A streptococcal infection Active Problems:   Sepsis due to urinary tract infection (HCC)   Acute respiratory failure (HCC)   Altered mental status   Protein-calorie malnutrition, severe    Marie Brewer is a 42 y.o. female with  hx cervical cancer s/p chemoxrt and brachytherapy (unclear status of disease), presence of bilateral ureteral stent for distal ureteral stricture/obstructive uropathy (stents in for a year), ckd stage 4 admitted with septic shock due to Group A streptococcal bacteremia--with source undoubtedly skin. She is no on CVVHD.  She has vasculitic appearing lesions on both lower extremities  Her WBC went to 60K now coming down  Her CT abdomen shows progression of bilateral opacities in the lungs  She had rhinovirus detected on PCR and MRSA on bronchoscopy.  She is on PCN G and zyvox  She has a history of heroin abuse and HCV +  #1 Septic shock from GAS  Skin is undoubtedly the source  I suspect she is now actively using intravenous drugs and that certainly could also be a means by which she became bacteremic  Her opacities in the lungs could be septic embolization from right sided endocarditis  Continue penicillin we will continue Zyvox  #2 Vasculitic lesions on legs:  Could this be due to resolved from cocaine? It was found in her UDS  Opiates were not but fentanyl would not be picked up on UDS  #  3 Anisocoria: seems resolved but worrisome. SHould get further CNS imaging when able to do so  #4 Leukocytosis: improving  #5 HCV +, HCVRNA pending  #5 IVDU: if this is ongoing as I suspect it likely is  then she would typically not be considered appropriate candidate for long term HD--at least what I have observed but would defer to Nephrology   I have personally spent 50 minutes involved in face-to-face and non-face-to-face activities for this patient on the day of the visit. Professional time spent includes the following activities: Preparing to see the patient (review of tests), Obtaining and/or reviewing separately obtained history (admission/discharge record), Performing a medically appropriate examination and/or evaluation , Ordering medications/tests/procedures, referring and communicating with other health care professionals, Documenting clinical information in the EMR, Independently interpreting results (not separately reported), Communicating results to the patient/family/caregiver, Counseling and educating the patient/family/caregiver and Care coordination (not separately reported).   Dr. Renold Don is back tomorrow.     LOS: 6 days   Acey Lav 07/22/2023, 12:18 PM

## 2023-07-23 DIAGNOSIS — N390 Urinary tract infection, site not specified: Secondary | ICD-10-CM | POA: Diagnosis not present

## 2023-07-23 DIAGNOSIS — R6521 Severe sepsis with septic shock: Secondary | ICD-10-CM | POA: Diagnosis not present

## 2023-07-23 DIAGNOSIS — A419 Sepsis, unspecified organism: Secondary | ICD-10-CM | POA: Diagnosis not present

## 2023-07-23 DIAGNOSIS — J9691 Respiratory failure, unspecified with hypoxia: Secondary | ICD-10-CM | POA: Diagnosis not present

## 2023-07-23 LAB — CBC WITH DIFFERENTIAL/PLATELET
Abs Immature Granulocytes: 3.2 10*3/uL — ABNORMAL HIGH (ref 0.00–0.07)
Band Neutrophils: 3 %
Basophils Absolute: 0 10*3/uL (ref 0.0–0.1)
Basophils Relative: 0 %
Eosinophils Absolute: 0.5 10*3/uL (ref 0.0–0.5)
Eosinophils Relative: 1 %
HCT: 26.4 % — ABNORMAL LOW (ref 36.0–46.0)
Hemoglobin: 7.4 g/dL — ABNORMAL LOW (ref 12.0–15.0)
Lymphocytes Relative: 9 %
Lymphs Abs: 4.1 10*3/uL — ABNORMAL HIGH (ref 0.7–4.0)
MCH: 29.7 pg (ref 26.0–34.0)
MCHC: 28 g/dL — ABNORMAL LOW (ref 30.0–36.0)
MCV: 106 fL — ABNORMAL HIGH (ref 80.0–100.0)
Metamyelocytes Relative: 1 %
Monocytes Absolute: 2.3 10*3/uL — ABNORMAL HIGH (ref 0.1–1.0)
Monocytes Relative: 5 %
Myelocytes: 6 %
Neutro Abs: 35.1 10*3/uL — ABNORMAL HIGH (ref 1.7–7.7)
Neutrophils Relative %: 75 %
Platelets: UNDETERMINED 10*3/uL (ref 150–400)
RBC: 2.49 MIL/uL — ABNORMAL LOW (ref 3.87–5.11)
RDW: 18.5 % — ABNORMAL HIGH (ref 11.5–15.5)
Smear Review: UNDETERMINED
WBC: 45 10*3/uL — ABNORMAL HIGH (ref 4.0–10.5)
nRBC: 2 % — ABNORMAL HIGH (ref 0.0–0.2)
nRBC: 2 /100{WBCs} — ABNORMAL HIGH

## 2023-07-23 LAB — POCT I-STAT 7, (LYTES, BLD GAS, ICA,H+H)
Acid-base deficit: 3 mmol/L — ABNORMAL HIGH (ref 0.0–2.0)
Bicarbonate: 23.6 mmol/L (ref 20.0–28.0)
Calcium, Ion: 1.17 mmol/L (ref 1.15–1.40)
HCT: 29 % — ABNORMAL LOW (ref 36.0–46.0)
Hemoglobin: 9.9 g/dL — ABNORMAL LOW (ref 12.0–15.0)
O2 Saturation: 96 %
Patient temperature: 98.4
Potassium: 5.6 mmol/L — ABNORMAL HIGH (ref 3.5–5.1)
Sodium: 132 mmol/L — ABNORMAL LOW (ref 135–145)
TCO2: 25 mmol/L (ref 22–32)
pCO2 arterial: 50.1 mm[Hg] — ABNORMAL HIGH (ref 32–48)
pH, Arterial: 7.28 — ABNORMAL LOW (ref 7.35–7.45)
pO2, Arterial: 93 mm[Hg] (ref 83–108)

## 2023-07-23 LAB — HEPATIC FUNCTION PANEL
ALT: 824 U/L — ABNORMAL HIGH (ref 0–44)
AST: 1347 U/L — ABNORMAL HIGH (ref 15–41)
Albumin: <1.5 g/dL — ABNORMAL LOW (ref 3.5–5.0)
Alkaline Phosphatase: 114 U/L (ref 38–126)
Bilirubin, Direct: <0.1 mg/dL (ref 0.0–0.2)
Total Bilirubin: 0.3 mg/dL (ref <1.2)
Total Protein: 8.8 g/dL — ABNORMAL HIGH (ref 6.5–8.1)

## 2023-07-23 LAB — CBC
HCT: 25.2 % — ABNORMAL LOW (ref 36.0–46.0)
Hemoglobin: 7.2 g/dL — ABNORMAL LOW (ref 12.0–15.0)
MCH: 29.5 pg (ref 26.0–34.0)
MCHC: 28.6 g/dL — ABNORMAL LOW (ref 30.0–36.0)
MCV: 103.3 fL — ABNORMAL HIGH (ref 80.0–100.0)
Platelets: 309 10*3/uL (ref 150–400)
RBC: 2.44 MIL/uL — ABNORMAL LOW (ref 3.87–5.11)
RDW: 18.2 % — ABNORMAL HIGH (ref 11.5–15.5)
WBC: 40.9 10*3/uL — ABNORMAL HIGH (ref 4.0–10.5)
nRBC: 1.2 % — ABNORMAL HIGH (ref 0.0–0.2)

## 2023-07-23 LAB — GLUCOSE, CAPILLARY
Glucose-Capillary: 101 mg/dL — ABNORMAL HIGH (ref 70–99)
Glucose-Capillary: 119 mg/dL — ABNORMAL HIGH (ref 70–99)
Glucose-Capillary: 124 mg/dL — ABNORMAL HIGH (ref 70–99)
Glucose-Capillary: 128 mg/dL — ABNORMAL HIGH (ref 70–99)
Glucose-Capillary: 129 mg/dL — ABNORMAL HIGH (ref 70–99)
Glucose-Capillary: 138 mg/dL — ABNORMAL HIGH (ref 70–99)
Glucose-Capillary: 140 mg/dL — ABNORMAL HIGH (ref 70–99)

## 2023-07-23 LAB — HEPATITIS PANEL, ACUTE
HCV Ab: REACTIVE — AB
Hep A IgM: NONREACTIVE
Hep B C IgM: NONREACTIVE
Hepatitis B Surface Ag: NONREACTIVE

## 2023-07-23 LAB — APTT: aPTT: 32 s (ref 24–36)

## 2023-07-23 LAB — RENAL FUNCTION PANEL
Albumin: <1.5 g/dL — ABNORMAL LOW (ref 3.5–5.0)
Albumin: 1.7 g/dL — ABNORMAL LOW (ref 3.5–5.0)
Anion gap: 9 (ref 5–15)
Anion gap: 14 (ref 5–15)
BUN: 25 mg/dL — ABNORMAL HIGH (ref 6–20)
BUN: 24 mg/dL — ABNORMAL HIGH (ref 6–20)
CO2: 23 mmol/L (ref 22–32)
CO2: 23 mmol/L (ref 22–32)
Calcium: 8.2 mg/dL — ABNORMAL LOW (ref 8.9–10.3)
Calcium: 8.8 mg/dL — ABNORMAL LOW (ref 8.9–10.3)
Chloride: 99 mmol/L (ref 98–111)
Chloride: 93 mmol/L — ABNORMAL LOW (ref 98–111)
Creatinine, Ser: 1.23 mg/dL — ABNORMAL HIGH (ref 0.44–1.00)
Creatinine, Ser: 1.32 mg/dL — ABNORMAL HIGH (ref 0.44–1.00)
GFR, Estimated: 56 mL/min — ABNORMAL LOW (ref >60)
GFR, Estimated: 51 mL/min — ABNORMAL LOW (ref >60)
Glucose, Bld: 153 mg/dL — ABNORMAL HIGH (ref 70–99)
Glucose, Bld: 112 mg/dL — ABNORMAL HIGH (ref 70–99)
Phosphorus: 3.1 mg/dL (ref 2.5–4.6)
Phosphorus: 5.1 mg/dL — ABNORMAL HIGH (ref 2.5–4.6)
Potassium: 5.3 mmol/L — ABNORMAL HIGH (ref 3.5–5.1)
Potassium: 6.4 mmol/L (ref 3.5–5.1)
Sodium: 131 mmol/L — ABNORMAL LOW (ref 135–145)
Sodium: 130 mmol/L — ABNORMAL LOW (ref 135–145)

## 2023-07-23 LAB — HCV RNA QUANT
HCV Quantitative Log: 3.975 {Log} (ref >1.70)
HCV Quantitative: 9440 [IU]/mL (ref >50)

## 2023-07-23 LAB — MAGNESIUM: Magnesium: 2.5 mg/dL — ABNORMAL HIGH (ref 1.7–2.4)

## 2023-07-23 LAB — TRIGLYCERIDES: Triglycerides: 205 mg/dL — ABNORMAL HIGH (ref <150)

## 2023-07-23 LAB — LACTIC ACID, PLASMA: Lactic Acid, Venous: 5.6 mmol/L (ref 0.5–1.9)

## 2023-07-23 MED ORDER — VASOPRESSIN 20 UNITS/100 ML INFUSION FOR SHOCK
0.0000 [IU]/min | INTRAVENOUS | Status: DC
  Administered 2023-07-23 – 2023-07-24 (×2): 0.03 [IU]/min via INTRAVENOUS
  Filled 2023-07-23 (×3): qty 100

## 2023-07-23 MED ORDER — NOREPINEPHRINE 16 MG/250ML-% IV SOLN
0.0000 ug/min | INTRAVENOUS | Status: DC
  Administered 2023-07-23: 2 ug/min via INTRAVENOUS
  Administered 2023-07-24: 5 ug/min via INTRAVENOUS
  Filled 2023-07-23 (×2): qty 250

## 2023-07-23 MED ORDER — SODIUM ZIRCONIUM CYCLOSILICATE 10 G PO PACK
10.0000 g | PACK | Freq: Two times a day (BID) | ORAL | Status: DC
  Administered 2023-07-23: 10 g
  Filled 2023-07-23: qty 1

## 2023-07-23 MED ORDER — MIDAZOLAM-SODIUM CHLORIDE 100-0.9 MG/100ML-% IV SOLN
0.0000 mg/h | INTRAVENOUS | Status: DC
  Administered 2023-07-23: 2 mg/h via INTRAVENOUS
  Administered 2023-07-24: 4 mg/h via INTRAVENOUS
  Administered 2023-07-25 – 2023-07-27 (×3): 5 mg/h via INTRAVENOUS
  Filled 2023-07-23 (×5): qty 100

## 2023-07-23 MED ORDER — MIDAZOLAM BOLUS VIA INFUSION
0.0000 mg | INTRAVENOUS | Status: DC | PRN
  Administered 2023-07-24 (×2): 1 mg via INTRAVENOUS
  Administered 2023-07-24: 2 mg via INTRAVENOUS
  Administered 2023-07-24 – 2023-07-25 (×4): 1 mg via INTRAVENOUS

## 2023-07-23 MED ORDER — PRISMASOL BGK 0/2.5 32-2.5 MEQ/L EC SOLN
Status: DC
  Filled 2023-07-23 (×24): qty 5000

## 2023-07-23 MED ORDER — POLYETHYLENE GLYCOL 3350 17 G PO PACK: 17.0000 g | PACK | Freq: Every day | ORAL | Status: DC

## 2023-07-23 MED ORDER — SODIUM BICARBONATE 8.4 % IV SOLN
100.0000 meq | INTRAVENOUS | Status: AC
  Administered 2023-07-23: 100 meq via INTRAVENOUS
  Filled 2023-07-23: qty 100

## 2023-07-23 MED ORDER — CLONAZEPAM 1 MG PO TABS
1.0000 mg | ORAL_TABLET | Freq: Three times a day (TID) | ORAL | Status: DC
  Administered 2023-07-23 – 2023-07-24 (×6): 1 mg
  Filled 2023-07-23 (×7): qty 1

## 2023-07-23 MED ORDER — DOCUSATE SODIUM 50 MG/5ML PO LIQD: 100.0000 mg | Freq: Two times a day (BID) | ORAL | Status: DC

## 2023-07-23 NOTE — Progress Notes (Addendum)
Admit: 07/16/2023 LOS: 7  76F with chronic obstructive uropathy with b/l stents, hx/o cervical CA, CKD4 here with AKI, metabolic acidosis, hyperkalemia, AMS, VDRF, septic shock from ?UTI  Current CRRT Prescription: Start Date: 07/17/23 Catheter: R internal jugular Temp HD cath placed 07/16/23 CCM BFR: 200-250 Pre Blood Pump: 2K DFR: 2000 4K Replacement Rate: 2K Goal UF: -57mL/h Anticoagulation: fixed dose heparin Clotting: events occurring infrequently  S: Seen and examined in ICU/on CRRT. NE dose increased, running net even. Persistent hyperK.  Pressors: NE Net neg 0.6L. Uop charted 75cc  O: 12/22 0701 - 12/23 0700 In: 3175.9 [I.V.:1215.9; NG/GT:1580; IV Piggyback:350] Out: 3777.2 [Urine:75; Stool:545]  BP 99/70   Pulse (!) 114   Temp 97.9 F (36.6 C) (Axillary)   Resp (!) 28   Ht 5\' 6"  (1.676 m)   Wt 66.5 kg   SpO2 100%   BMI 23.66 kg/m   Gen: ill appearing, intubated/sedated CV: RRR Resp: mech breath sounds Abd: soft Ext: b/l ext wounds, no sig edema b/l le;s Neuro: sedated Dialysis access: RIJ temp HD catheter in use  Filed Weights   07/21/23 0500 07/22/23 0500 07/23/23 0400  Weight: 65.2 kg 65.3 kg 66.5 kg    Recent Labs  Lab 07/22/23 0310 07/22/23 1531 07/23/23 0255  NA 134* 132* 131*  K 5.2* 5.5* 5.3*  CL 103 100 99  CO2 23 24 23   GLUCOSE 148* 145* 153*  BUN 25* 22* 25*  CREATININE 1.65* 1.31* 1.23*  CALCIUM 8.0* 8.4* 8.2*  PHOS 3.3 3.1 3.1   Recent Labs  Lab 07/16/23 1652 07/16/23 1658 07/19/23 0656 07/19/23 1431 07/21/23 0442 07/22/23 0308 07/23/23 0254  WBC 46.1*   < > 50.0*   < > 62.8* 46.6* 40.9*  NEUTROABS 42.0*  --  45.5*  --   --   --   --   HGB 10.8*   < > 6.4*   < > 7.8* 7.1* 7.2*  HCT 36.0   < > 20.9*   < > 26.0* 23.8* 25.2*  MCV 95.2   < > 96.8   < > 99.2 97.9 103.3*  PLT 704*   < > 229   < > 271 251 309   < > = values in this interval not displayed.    Scheduled Meds:  sodium chloride   Intravenous Once    Chlorhexidine Gluconate Cloth  6 each Topical Daily   etomidate  0.3 mg/kg Intravenous Once   feeding supplement (PROSource TF20)  60 mL Per Tube BID   fiber supplement (BANATROL TF)  60 mL Per Tube BID   heparin injection (subcutaneous)  5,000 Units Subcutaneous Q8H   insulin aspart  0-9 Units Subcutaneous Q4H   linezolid  600 mg Per Tube Q12H   multivitamin  1 tablet Per Tube QHS   mouth rinse  15 mL Mouth Rinse Q2H   oxyCODONE  10 mg Per Tube Q6H   pantoprazole (PROTONIX) IV  40 mg Intravenous QHS   sodium chloride flush  5 mL Intracatheter Q8H   Continuous Infusions:  feeding supplement (VITAL 1.5 CAL) 50 mL/hr at 07/23/23 0700   fentaNYL infusion INTRAVENOUS 200 mcg/hr (07/23/23 0700)   heparin 10,000 units/ 20 mL infusion syringe 350 Units/hr (07/22/23 2048)   norepinephrine (LEVOPHED) Adult infusion 6 mcg/min (07/23/23 0700)   pencillin G potassium IV 3 Million Units (07/23/23 0735)   prismasol BGK 2/2.5 dialysis solution     prismasol BGK 2/2.5 replacement solution 400 mL/hr at 07/23/23 0551  prismasol BGK 2/2.5 replacement solution 400 mL/hr at 07/23/23 0551   propofol (DIPRIVAN) infusion 30 mcg/kg/min (07/23/23 0700)   PRN Meds:.acetaminophen, fentaNYL, heparin, heparin, midazolam, mouth rinse  ABG    Component Value Date/Time   PHART 7.225 (L) 07/21/2023 0039   PCO2ART 57.6 (H) 07/21/2023 0039   PO2ART 86 07/21/2023 0039   HCO3 23.6 07/21/2023 0039   TCO2 25 07/21/2023 0039   ACIDBASEDEF 4.0 (H) 07/21/2023 0039   O2SAT 93 07/21/2023 0039    A 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 Changing dialysate to 2k bath. Not ready to transition to IHD but concerned about overall prognosis and ability to tolerate longterm HD given her current clinical situation. GOC per primary service+family Cont fixed dose heparin Discussed with ICU RN  Addendum 432pm: persistent hyperK despite change in dialysate to 2k. Will add lokelma 10g BID per tube. Discussed with RN For elevated LFTs, possible that she has PRIS given that she has been on propofol? Discussed with primary service  Anthony Sar, MD  [facility]

## 2023-07-23 NOTE — Progress Notes (Signed)
   07/23/23 0826  Daily Weaning Assessment  Daily Assessment of Readiness to Wean Wean protocol criteria met (SBT performed)  SBT Method CPAP 5 cm H20 and PS 5 cm H20  Weaning Start Time 0826   RT placed Pt on CPAP/PS and is tolerating fairly well with mild agitation. MD and RN aware.

## 2023-07-23 NOTE — Progress Notes (Signed)
NAME:  Marie Brewer, MRN:  [id number], DOB:  [birth year], LOS: 7 ADMISSION DATE:  07/16/2023, CONSULTATION DATE:  07/16/23 REFERRING MD:  [facility]-EDP, CHIEF COMPLAINT:  Sepsis, presumed urosepsis   History of present illness   42 year old woman who presented to [facility] ED 12/16 for unresponsiveness. PMHx significant for CKD stage 4, bilateral ureteral obstruction (s/p stent placement 10/2022), cervical CA stage IIB (s/p chemotherapy/XRT, followed at [facility]), polysubstance abuse (current heroin use via inhalation).   Patient presented to [facility] ED with decreased responsiveness x 3 days, per patient's husband (at bedside). Has not been feeling well for about a month but is often hesitant to come to the ED and was adamantly refusing going to the hospital. PO intake has been poor, has been only drinking [facility] and not eating much. No reported fevers at home, but chills/general malaise. Intermittent nausea, no vomiting/diarrhea. Per husband, has complained of abdominal pain/pain with urination. Of note, has bilateral ureteral stents placed in 03/2022; she was supposed to follow up for stent exchange 01/2023 but did not present as scheduled. Additionally, husband notes she continues to use heroin via inhalation, denies injection.   On ED arrival, patient was afebrile, HR 111, BP 126/80, SpO2 100%. She was initially somnolent with GCS 12 but intermittently agitated/not following command, thrashing and causing risk to herself/staff. Ultimately patient was intubated for her safety/airway protection. Labs were notable for WBC 46.1, Hgb 10.8, Plt 704 (suspect concentrated), Na 130, K 5.7, CO2 < 7, BUN 158/Cr 12.13 (baseline ~4). Transaminases/Tbili WNL, Alk Phos mildly elevated. Lipase 133. TSH 4.046. LA 6.1 > 0.8 after IV fluids. CK 198. Ammonia 79, Ethanol < 10, APAP/salicylates negative. UDS pending. VBG 6.974/pCO2 23.4/bicarb 5.4. PCT 29.14, MRSA PCR positive (nares). BCx pending, cefepime/vanc initiated. CT  Chest/A/P demonstrated bilateral centrilobular micronodules (tree-in-bud) greatest in bilateral lower lobes, small amount pneumomediastinum (history of esophageal perf), thick-walled nondistended urinary bladder with Foley, bilateral hydronephrosis with bilateral ureteral stents.   PCCM consulted for ICU admission. Nephro consulted for ARF and emergent CRRT initiation.  Past Medical History  CKD stage IV Bilateral ureteral obstruction status post stent placement April 2024 Cervical cancer stage IIb s/p chemotherapy and radiation Heroin use  Significant Hospital Events   12/16 - Presented to [facility] ED with unresponsiveness, decreased PO intake. Unresponsiveness with intermittent agitation requiring intubation. Found to be in acute renal failure with Cr 12 (baseline ~4), BUN > 100. WBC 46. Briefly required Levophed prior to intubation. Broad-spectrum antibiotic started (cefepime/vanc). RIJ Trialysis catheter placed. CRRT started per Nephro. 12/17- IR placed bilateral percutaneous nephrostomy tubes and removed bilateral ureteral stents 12/19 started enteral methodone 12/20 did not tolerate coming of CRRT for a trial of iHD due to electrolyte derrangements 12/22 switched methadone to oxycodone due to transaminitis  Interim history/subjective:  Passing PS trial. Still on CRRT. Still on high sedation.   Objective   Blood pressure 122/72, pulse (!) 119, temperature 97.9 F (36.6 C), temperature source Axillary, resp. rate (!) 32, height 5\' 6"  (1.676 m), weight 66.5 kg, SpO2 100%.    Vent Mode: PSV;CPAP FiO2 (%):  [40 %] 40 % Set Rate:  [32 bmp] 32 bmp Vt Set:  [470 mL] 470 mL PEEP:  [5 cmH20] 5 cmH20 Pressure Support:  [5 cmH20] 5 cmH20 Plateau Pressure:  [20 cmH20-23 cmH20] 21 cmH20   Intake/Output Summary (Last 24 hours) at 07/23/2023 0943 Last data filed at 07/23/2023 0900 Gross per 24 hour  Intake 3336.15 ml  Output 3520 ml  Net -183.85 ml  Filed Weights   07/21/23 0500 07/22/23  0500 07/23/23 0400  Weight: 65.2 kg 65.3 kg 66.5 kg    Examination: Gen:      Intubated, sedated, acutely ill appearing, thin, cachectic HEENT:  ETT to vent Lungs:    sounds of mechanical ventilation auscultated no wheeze CV:         tachycardic, regular Abd:      + bowel sounds; soft, non-tender; no palpable masses, no distension Ext:    No edema Skin:      extensive lower extremity ulcerations and erythema, bandaged Neuro:   sedated, RASS -3, moves all 4 extremities     Labs/imaging reviewed  Na 131 K 5.3 Cr 1.23 BUN 23 CBGs 140-180 Alb<1.5 TG 205 WBC 40.9 Hgb 7.2  Resolved Hospital Problem list   Severe metabolic acidosis Uremia Hyperkalemia  Assessment & Plan:  42 y.o. year old female with pmh of cervical cancer s/p chemoradiation complicated by ureteral stenosis with stent placement, heroin abuse who present altered mental status and admitted to ICU 12/16 for septic shock.   Septic Shock Group A strep bacteremia, Bilateral lower extremity wounds Rhinovirus/MRSA pneumonia History of bilateral ureteral obstruction s/p nephrostomy tube placement Leukocytosis -continue penicillin G and linezolid -strict I and Os -CT A/P obtained as well as repeat blood cultures negative due to persistent shock and leukocytosis. Both negative thus far. C. Diff testing also negative.  - appreciate ID assistance  Oliguric Acute renal failure, dependent on RRT CKD stage IV - continue crrt per nephrology. Ideal outcome is renal recovery as she is a poor long term dialysis candidate with IVDU prior to admission. Volume removal as tolerated with goal net negative fluid balance -trend RFP this PM -strict I and Os -avoid nephrotoxic agents  Acute hypoxic respiratory failure Concern for aspiration pneumonitis - passed SBT, mental status barrier to extubation today -Full vent support (4-8cc/kg IBWW) -wean FiO2 for O2 sat>90% -Daily WUA/SBT once appropriate from a mental status standpoint   -VAP bundle -PAD protocol: Propofol and fentanyl RASS -1 to -2 -triglyceride while on propofol - continue oxycodone 10q6. Will add klonopin to help reduce IV sedation and facilitate potential ventilator liberation   History of polysubstance abuse with current heroin use, inhalation Husband endorses heroin use. High risk for withdrawal symptoms.  -UDS positive for cocaine  Diabetes - cbgs at goal  Small volume pneumomediastinum Small amount of pneumomediastinum seen on CT chest 12/16. Has history of esophageal perforation and inhales heroin. Repeat CXR 12/20 without abnormabilities. - Monitor closely for now, repeat chest xray if clinically indicated  History of cervical cancer Stage IIb, status post chemoradiation diagnosed in 2011.  Followed with Dr. Bertis Ruddy at [facility], last seen 3/23 with recommendations for CT abd/pelvis with 1/23 CT showing lymphadenopathy. CT 07/16/23 with similar prominent retroperitoneal and decreased inguinal lymph nodes. -continue outpatient follow-up  Best practice:  Diet: NPO, tube feeds Pain/Anxiety/Delirium protocol (if indicated): propofol, fentanyl DVT prophylaxis: Heparin GI prophylaxis: PPI Glucose control: SSI Code Status: Full Family Communication: husband at bedside, updated 12/23. Updated mother 12/22. Plan is for family meeting 12/24 at noon with her whole family to discuss goals of care.   The patient is critically ill due to respiratory failure, shock.  Critical care was necessary to treat or prevent imminent or life-threatening deterioration.  Critical care was time spent personally by me on the following activities: development of treatment plan with patient and/or surrogate as well as nursing, discussions with consultants, evaluation of patient's response to treatment,  examination of patient, obtaining history from patient or surrogate, ordering and performing treatments and interventions, ordering and review of laboratory studies, ordering and  review of radiographic studies, pulse oximetry, re-evaluation of patient's condition and participation in multidisciplinary rounds.   Critical Care Time devoted to patient care services described in this note is 35 minutes. This time reflects time of care of this signee Charlott Holler . This critical care time does not reflect separately billable procedures or procedure time, teaching time or supervisory time of PA/NP/Med student/Med Resident etc but could involve care discussion time.       Charlott Holler [facility] 07/23/2023 9:43 AM  Pager: see AMION  If no response to pager , please call critical care on call (see AMION) until 7pm After 7:00 pm call Elink

## 2023-07-23 NOTE — Plan of Care (Signed)
  Problem: Safety: Goal: Non-violent Restraint(s) Outcome: Not Progressing   Problem: Education: Goal: Ability to describe self-care measures that may prevent or decrease complications (Diabetes Survival Skills Education) will improve Outcome: Not Progressing Goal: Individualized Educational Video(s) Outcome: Not Progressing   Problem: Coping: Goal: Ability to adjust to condition or change in health will improve Outcome: Not Progressing   Problem: Fluid Volume: Goal: Ability to maintain a balanced intake and output will improve Outcome: Not Progressing   Problem: Health Behavior/Discharge Planning: Goal: Ability to identify and utilize available resources and services will improve Outcome: Not Progressing Goal: Ability to manage health-related needs will improve Outcome: Not Progressing   Problem: Metabolic: Goal: Ability to maintain appropriate glucose levels will improve Outcome: Not Progressing   Problem: Nutritional: Goal: Maintenance of adequate nutrition will improve Outcome: Not Progressing Goal: Progress toward achieving an optimal weight will improve Outcome: Not Progressing   Problem: Skin Integrity: Goal: Risk for impaired skin integrity will decrease Outcome: Not Progressing   Problem: Tissue Perfusion: Goal: Adequacy of tissue perfusion will improve Outcome: Not Progressing   Problem: Education: Goal: Knowledge of General Education information will improve Description: Including pain rating scale, medication(s)/side effects and non-pharmacologic comfort measures Outcome: Not Progressing   Problem: Health Behavior/Discharge Planning: Goal: Ability to manage health-related needs will improve Outcome: Not Progressing   Problem: Clinical Measurements: Goal: Ability to maintain clinical measurements within normal limits will improve Outcome: Not Progressing Goal: Will remain free from infection Outcome: Not Progressing Goal: Diagnostic test results will  improve Outcome: Not Progressing Goal: Respiratory complications will improve Outcome: Not Progressing Goal: Cardiovascular complication will be avoided Outcome: Not Progressing   Problem: Activity: Goal: Risk for activity intolerance will decrease Outcome: Not Progressing   Problem: Nutrition: Goal: Adequate nutrition will be maintained Outcome: Not Progressing   Problem: Coping: Goal: Level of anxiety will decrease Outcome: Not Progressing   Problem: Elimination: Goal: Will not experience complications related to bowel motility Outcome: Not Progressing Goal: Will not experience complications related to urinary retention Outcome: Not Progressing   Problem: Pain Management: Goal: General experience of comfort will improve Outcome: Not Progressing   Problem: Safety: Goal: Ability to remain free from injury will improve Outcome: Not Progressing   Problem: Skin Integrity: Goal: Risk for impaired skin integrity will decrease Outcome: Not Progressing

## 2023-07-23 NOTE — Progress Notes (Signed)
Pt was flipped back to full support due to increase agitation (intercostal muscle use, RR >36, Mve 20's). RN aware.

## 2023-07-23 NOTE — Progress Notes (Addendum)
Interdisciplinary Goals of Care Family Meeting   Date carried out: 07/23/2023  Location of the meeting: Bedside  Member's involved: Physician, Bedside Registered Nurse, and Family Member or next of kin  Durable Power of Attorney or acting medical decision maker: Mellody Dance, husband, Dianna, mother    Discussion: We discussed goals of care for [employer] .  Progressive clinical decline over the course of the afternoon with worsening shock and inability to remove net negative fluid with CRRT.  ABG with mixed metabolic respiratory acidosis, lactic acid 5.6. Discussed with renal and infectious disease. PRIS is a consideration, as well as DRESS. Will stop propofol, switch to versed. Plan is for family meeting tomorrow as patient has multiple family members who have difficulty getting up here. Husband Mellody Dance is POA although they are separated per sister at bedside. Family updated at bedside that she is showing signs of clinical deterioration despite all aggressive support - vent, CRRT, vasopressors. Concern for potential cardiac arrest. Spoke with patient's mother dianna and husband keith at bedside. Mother is coming in tonight. Agreeable to DNR.   Code status:   Code Status: Do not attempt resuscitation (DNR) PRE-ARREST INTERVENTIONS DESIRED   Disposition: Continue current acute care  Time spent for the meeting: 45 minutes for patient care and meeting.     Charlott Holler, MD  07/23/2023, 5:28 PM

## 2023-07-23 NOTE — Progress Notes (Signed)
Nutrition Follow-up  DOCUMENTATION CODES:  Severe malnutrition in context of social or environmental circumstances  INTERVENTION:  Continue tube feeding via NG tube: Vital 1.5 at 50 ml/h (1200 ml per day) Prosource TF20 60 ml BID Provides 1960 kcal, 121 gm protein, 917 ml free water daily Renal MVI Banatrol TID-provides 45kcal, 5g soluble fiber and 2g protein per  serving.   NUTRITION DIAGNOSIS:  Severe Malnutrition related to social / environmental circumstances (drug abuse) as evidenced by severe muscle depletion, severe fat depletion. - remains applicable  GOAL:  Patient will meet greater than or equal to 90% of their needs - goal met via TF  MONITOR:  TF tolerance, I & O's, Vent status, Labs, Weight trends  REASON FOR ASSESSMENT:  Consult Assessment of nutrition requirement/status (CRRT)  ASSESSMENT:  Pt with hx of cervical cancer (completed treatment 2011), bilateral ureteral obstruction (s/p stent placement 10/2022), CKD4, and polysubstance abuse presented to ED with abdominal pain and AMS for several days.  12/16 - presented to ED, intubated, CRRT initiated    Patient is currently intubated on ventilator support  Remains on CRRT. Per Nephrology, not ready to transition to [facility] however concern of prognosis and ability to tolerate longterm HD.  Passed SBT, mental status remains barrier to discharge.  Noted plans for family GOC discussion 12/24.  Tolerating TF at goal via NGT.  Banatrol added TID yesterday given ongoing loose stools.  MV: 14.3 L/min Temp (24hrs), Avg:98 F (36.7 C), Min:97.6 F (36.4 C), Max:98.7 F (37.1 C) MAP (cuff): 65  Admit weight: 70.3 kg  Current weight: 66.5 kg   Intake/Output Summary (Last 24 hours) at 07/23/2023 1644 Last data filed at 07/23/2023 1600 Gross per 24 hour  Intake 3379.09 ml  Output 3505 ml  Net -125.91 ml   Net IO Since Admission: 881.61 mL [07/23/23 1644]  Drains/Lines: L Nephrostomy tube- 35ml output x24  hours R nephrostomy tube- 40ml output x24 hours FMS RIJ temp HD cath  Nutritionally Relevant Medications: Scheduled Meds:  sodium chloride   Intravenous Once   Chlorhexidine Gluconate Cloth  6 each Topical Daily   clonazePAM  1 mg Per Tube TID   etomidate  0.3 mg/kg Intravenous Once   feeding supplement (PROSource TF20)  60 mL Per Tube BID   fiber supplement (BANATROL TF)  60 mL Per Tube BID   heparin injection (subcutaneous)  5,000 Units Subcutaneous Q8H   insulin aspart  0-9 Units Subcutaneous Q4H   linezolid  600 mg Per Tube Q12H   multivitamin  1 tablet Per Tube QHS   mouth rinse  15 mL Mouth Rinse Q2H   oxyCODONE  10 mg Per Tube Q6H   pantoprazole (PROTONIX) IV  40 mg Intravenous QHS   sodium bicarbonate  100 mEq Intravenous STAT   sodium chloride flush  5 mL Intracatheter Q8H   sodium zirconium cyclosilicate  10 g Per Tube BID   Continuous Infusions:  feeding supplement (VITAL 1.5 CAL) 50 mL/hr at 07/23/23 1600   fentaNYL infusion INTRAVENOUS 125 mcg/hr (07/23/23 1600)   heparin 10,000 units/ 20 mL infusion syringe 350 Units/hr (07/23/23 1535)   norepinephrine (LEVOPHED) Adult infusion 13 mcg/min (07/23/23 1600)   pencillin G potassium IV 100 mL/hr at 07/23/23 1600   prismasol BGK 2/2.5 dialysis solution 1,500 mL/hr at 07/23/23 1415   prismasol BGK 2/2.5 replacement solution 400 mL/hr at 07/23/23 0551   prismasol BGK 2/2.5 replacement solution 400 mL/hr at 07/23/23 0551   propofol (DIPRIVAN) infusion 20 mcg/kg/min (07/23/23 1600)  Labs Reviewed: Sodium 131 Potassium 5.3 (H) BUN 25 Cr 1.23 Phos 3.1 (WDL) Mg 2.5 (H) Albumin <1.5 AST 1347 ALT 824 GFR 56 CBG ranges from 111-140 mg/dL over the last 24 hours  Diet Order:   Diet Order             Diet NPO time specified  Diet effective now                   EDUCATION NEEDS:  Not appropriate for education at this time  Skin:  Skin Assessment: Reviewed RN Assessment  Last BM:  x24 hours via  FMS  Height:  Ht Readings from Last 1 Encounters:  07/16/23 5\' 6"  (1.676 m)    Weight:  Wt Readings from Last 1 Encounters:  07/23/23 66.5 kg   Ideal Body Weight:  59.1 kg  BMI:  Body mass index is 23.66 kg/m.  Estimated Nutritional Needs:  Kcal:  1900-2100 kcal/d Protein:  115-130g/d Fluid:  2L/d  Drusilla Kanner, RDN, LDN Clinical Nutrition

## 2023-07-23 NOTE — TOC Progression Note (Signed)
Transition of Care [facility]) - Progression Note    Patient Details  Name: Marie Brewer MRN: [id number] Date of Birth: [birth year]  [facility] CM/SW Contact  Harriet Masson, RN Phone Number: 07/23/2023, 1:42 PM  Clinical Narrative:    Plan is for family meeting 12/24 at noon with her whole family to discuss goals of care. TOC following.      Barriers to Discharge: Continued Medical Work up  Expected Discharge Plan and Services       Living arrangements for the past 2 months: Apartment                                       Social Determinants of Health (SDOH) Interventions SDOH Screenings   Food Insecurity: Patient Unable To Answer (07/19/2023)  Housing: Patient Unable To Answer (07/19/2023)  Transportation Needs: Patient Unable To Answer (07/19/2023)  Utilities: Patient Unable To Answer (07/19/2023)  Tobacco Use: High Risk (07/17/2023)    Readmission Risk Interventions     No data to display

## 2023-07-23 NOTE — Progress Notes (Signed)
[facility]  Date of Admission:  07/16/2023     Lines:  12/17-c bilateral nephrostomy tubes 12/16-c right internal jugular hd catheter     Abx: 12/17-c linezolid 12/19-c penicillin g  12/16-19 cefepime 12/17-12/18 metronidazole 12/16 vanc                                                           Assessment: 42 yo female with hx cervical cancer s/p chemoxrt and brachytherapy (unclear status of disease), presence of bilateral ureteral stent for distal ureteral stricture/obstructive uropathy (stents in for a year), ckd stage 4, admitted with acute ams, abd pain, hematuria, found to have:   Septic shock -- bp 42/16 on presentation cellulitis Group a strep bacteremia Hypoxic respiratory failure -- intubated on presentation in setting septic shock Ct imaging distended bladder and tree in bud bilateral basal lungs  AKI     12/16 mrsa nares pcr positive 12/17 ucx in progress 12/17 respiratory culture (BAL) in progress 12/16 bcx 1 of 2 set with group a strep by bcid   Source for group a strep might be lower ext open wound/cellulitis. No sign of nec fasc now but would need to closely monitor   Doesn't appear to have toxic shock syndrome at this time     She has chronic intermittent dysuria in setting of cervical cancer/xrt and bilateral ureteral stricture. No clinical change per history. Ct showed stable hydronephrosis   Bilateral basilar chest ct finding tree in bud ?aspiration. S/p BAL sampling     At this time covering broadly with abx    ------------ 12/18 id assessment Off pressors this morning Repeat bcx 12/17 CoNS likely contaminant 12/17 BAL cx ngtd 12/17 s/p ureteral stent removal bilaterally and placement perc-nephrostomy tubes 12/17 respiratory viral pcr rhino virus  12/17 ucx multiple species    Bilateral LE open wounds/scabs and well marked erythema without fluctuance/blister.   Suspect source of infection/sepsis  mainly driven by group a strep process with rhinovirus minorly contribute and explaining tree in bud changes on chest imaging   Urine process chronic and doesn't appear to contribute  Off crrt for the latter part of today   12/19 id assessment Crrt restarted and there was concern for membranes being clotted off with failed blood return --> hb 6's Remains off levophed Rash in legs stable no sign of nec fasc  1 episode of diarrhea today 12/19 am. Wbc up to 50 but maybe reactive due to noninfectious cause. Will monitor Abdomen soft Chest xray for the most part clear with nonspecific bibasilar opacity  Bal cx from 12/17 rare mrsa   Repeat bcx 12/16 showed CoNS contaminant  ----------- 07/20/23 id assessment Remains on crrt Afebrile Sbp 100s-110s No rash on exam but mild lft yesterday; bilateral LE cellulitis looks clinicaly improved Diarrhea likely tube feed/abx related -- cdiff testing negative Stable low vent level requirement -- no sign of HAP; cxr improved basilar opacities Pcn's draining well but query if some intraabd/renal abscess had developed  Moderate eosinophilia new 12/19 and leukemoid reaction ?adrenal insufficiency  Repeat bcx sent per primary team today 07/20/23   Pending tte   ----------------- 12/23 id assessment Remains on crrt Back on pressors Worsening transaminitis ?acute liver failure --> vs abx/medication side effect (  there was eosinophilia on 12/19) such as DRESS ?shock liver from septic shock (12/20 tte 70% ef) with delayed improvement ?budd chiary ?acute viral hepatitis Wbc peaked at 60s and has been trending down Borderline blood pressure on crrt Repeat abd pelc ct (noncontrast unrevealing); repeat bcx negative Bilateral lower ext cellulitis changes better  Some concern for vasculitis like lesion in the lower legs by my partner in setting possible cocaine use. Conservative care would be management  Previous random cortisol level  normal   Plan:  From id standpoint for group a strep, can stop linezolid today (will have had 7 days and would cover for the mrsa found in the lung which I don't think is pna) Continue penicillin g for now Differential for cbc to evaluate eosinophilia Acute hepatitis serology Defer budd chiary or other acute hepatitis w/u to pulm/ccm Other supportive care per pulm ccm Discussed with primary team         Principal Problem:   Group A streptococcal infection Active Problems:   Sepsis due to urinary tract infection (HCC)   Acute respiratory failure (HCC)   Altered mental status   Protein-calorie malnutrition, severe   Allergies  Allergen Reactions   Imitrex [Sumatriptan Base] Palpitations    Heart races   Cephalexin Rash    Patient passed amoxicillin challenge on 08/18/20, no adverse effect    Scheduled Meds:  sodium chloride   Intravenous Once   Chlorhexidine Gluconate Cloth  6 each Topical Daily   clonazePAM  1 mg Per Tube TID   etomidate  0.3 mg/kg Intravenous Once   feeding supplement (PROSource TF20)  60 mL Per Tube BID   fiber supplement (BANATROL TF)  60 mL Per Tube BID   heparin injection (subcutaneous)  5,000 Units Subcutaneous Q8H   insulin aspart  0-9 Units Subcutaneous Q4H   linezolid  600 mg Per Tube Q12H   multivitamin  1 tablet Per Tube QHS   mouth rinse  15 mL Mouth Rinse Q2H   oxyCODONE  10 mg Per Tube Q6H   pantoprazole (PROTONIX) IV  40 mg Intravenous QHS   sodium chloride flush  5 mL Intracatheter Q8H   Continuous Infusions:  feeding supplement (VITAL 1.5 CAL) 50 mL/hr at 07/23/23 1400   fentaNYL infusion INTRAVENOUS 150 mcg/hr (07/23/23 1400)   heparin 10,000 units/ 20 mL infusion syringe 350 Units/hr (07/22/23 2048)   norepinephrine (LEVOPHED) Adult infusion 11 mcg/min (07/23/23 1400)   pencillin G potassium IV Stopped (07/23/23 1150)   prismasol BGK 2/2.5 dialysis solution 1,500 mL/hr at 07/23/23 1415   prismasol BGK 2/2.5 replacement  solution 400 mL/hr at 07/23/23 0551   prismasol BGK 2/2.5 replacement solution 400 mL/hr at 07/23/23 0551   propofol (DIPRIVAN) infusion 30 mcg/kg/min (07/23/23 1400)   PRN Meds:.acetaminophen, fentaNYL, heparin, heparin, midazolam, mouth rinse   SUBJECTIVE: Wbc down Repeat bcx negative Repeat ct abd 12/20 reviewed unremarkable  Remains on crrt  Hypotensive -- back on pressors  Remains cometose/intubated   Sister by bedside  Family meeting tomorrow regarding goals of care      Review of Systems: ROS All other ROS was negative, except mentioned above     OBJECTIVE: Vitals:   07/23/23 1330 07/23/23 1345 07/23/23 1400 07/23/23 1415  BP: (!) 74/52 (!) 83/56 (!) 84/60 (!) 90/55  Pulse:   (!) 116   Resp: (!) 29 (!) 34 (!) 32 (!) 31  Temp:      TempSrc:      SpO2:   98%  Weight:      Height:       Body mass index is 23.66 kg/m.  Physical Exam General/constitutional: acutely ill appearing, comatose/sedated; older than stated age; cachectic HEENT: Normocephalic CV: rrr no mrg Lungs: clear to auscultation, normal respiratory effort Abd: Soft, Nontender Ext: trace ext edema; bilateral LE dressing clean/dry Skin: no other rash Neuro: sedated/comatose MSK: no peripheral joint swelling/warmth  Central line presence: right internal jugular hd cath site no redness/purulence   Lab Results Lab Results  Component Value Date   WBC 40.9 (H) 07/23/2023   HGB 7.2 (L) 07/23/2023   HCT 25.2 (L) 07/23/2023   MCV 103.3 (H) 07/23/2023   PLT 309 07/23/2023    Lab Results  Component Value Date   CREATININE 1.23 (H) 07/23/2023   BUN 25 (H) 07/23/2023   NA 131 (L) 07/23/2023   K 5.3 (H) 07/23/2023   CL 99 07/23/2023   CO2 23 07/23/2023    Lab Results  Component Value Date   ALT 824 (H) 07/23/2023   AST 1,347 (H) 07/23/2023   ALKPHOS 114 07/23/2023   BILITOT 0.3 07/23/2023      Microbiology: Recent Results (from the past 240 hours)  Resp panel by RT-PCR (RSV,  Flu A&B, Covid) Anterior Nasal Swab     Status: None   Collection Time: 07/16/23  4:49 PM   Specimen: Anterior Nasal Swab  Result Value Ref Range Status   SARS Coronavirus 2 by RT PCR NEGATIVE NEGATIVE Final   Influenza A by PCR NEGATIVE NEGATIVE Final   Influenza B by PCR NEGATIVE NEGATIVE Final    Comment: (NOTE) The Xpert Xpress SARS-CoV-2/FLU/RSV plus assay is intended as an aid in the diagnosis of influenza from Nasopharyngeal swab specimens and should not be used as a sole basis for treatment. Nasal washings and aspirates are unacceptable for Xpert Xpress SARS-CoV-2/FLU/RSV testing.  Fact Sheet for Patients: BloggerCourse.com  Fact Sheet for Healthcare Providers: SeriousBroker.it  This test is not yet approved or cleared by the [place] FDA and has been authorized for detection and/or diagnosis of SARS-CoV-2 by FDA under an Emergency Use Authorization (EUA). This EUA will remain in effect (meaning this test can be used) for the duration of the COVID-19 declaration under Section 564(b)(1) of the Act, 21 U.S.C. section 360bbb-3(b)(1), unless the authorization is terminated or revoked.     Resp Syncytial Virus by PCR NEGATIVE NEGATIVE Final    Comment: (NOTE) Fact Sheet for Patients: BloggerCourse.com  Fact Sheet for Healthcare Providers: SeriousBroker.it  This test is not yet approved or cleared by the [place] FDA and has been authorized for detection and/or diagnosis of SARS-CoV-2 by FDA under an Emergency Use Authorization (EUA). This EUA will remain in effect (meaning this test can be used) for the duration of the COVID-19 declaration under Section 564(b)(1) of the Act, 21 U.S.C. section 360bbb-3(b)(1), unless the authorization is terminated or revoked.  Performed at [facility], [address], [place], [place] [address]   Blood culture (routine x  2)     Status: Abnormal   Collection Time: 07/16/23  4:50 PM   Specimen: BLOOD  Result Value Ref Range Status   Specimen Description BLOOD SITE NOT SPECIFIED  Final   Special Requests   Final    BOTTLES DRAWN AEROBIC ONLY Blood Culture adequate volume   Culture  Setup Time   Final    GRAM POSITIVE COCCI IN CHAINS ANAEROBIC BOTTLE ONLY CRITICAL RESULT CALLED TO, READ BACK BY AND  VERIFIED WITH: PHARMD ELIZABETH M. [phone] [id number] FCP GRAM POSITIVE COCCI IN CLUSTERS BOTTLES DRAWN AEROBIC ONLY CRITICAL RESULT CALLED TO, READ BACK BY AND VERIFIED WITH: PHARMD JESSICA MILLEN ON 07/17/23 @ 1723 BY DRT    Culture (A)  Final    STREPTOCOCCUS PYOGENES STAPHYLOCOCCUS EPIDERMIDIS THE SIGNIFICANCE OF ISOLATING THIS ORGANISM FROM A SINGLE SET OF BLOOD CULTURES WHEN MULTIPLE SETS ARE DRAWN IS UNCERTAIN. PLEASE NOTIFY THE MICROBIOLOGY DEPARTMENT WITHIN ONE WEEK IF SPECIATION AND SENSITIVITIES ARE REQUIRED. HEALTH DEPARTMENT NOTIFIED Performed at [facility], [address] [place] [place], [place] [address]    Report Status 07/19/2023 FINAL  Final   Organism ID, Bacteria STREPTOCOCCUS PYOGENES  Final      Susceptibility   Streptococcus pyogenes - MIC*    PENICILLIN <=0.06 SENSITIVE Sensitive     CEFTRIAXONE <=0.12 SENSITIVE Sensitive     ERYTHROMYCIN <=0.12 SENSITIVE Sensitive     LEVOFLOXACIN 2 SENSITIVE Sensitive     VANCOMYCIN 0.5 SENSITIVE Sensitive     * STREPTOCOCCUS PYOGENES  Blood Culture ID Panel (Reflexed)     Status: Abnormal   Collection Time: 07/16/23  4:50 PM  Result Value Ref Range Status   Enterococcus faecalis NOT DETECTED NOT DETECTED Final   Enterococcus Faecium NOT DETECTED NOT DETECTED Final   Listeria monocytogenes NOT DETECTED NOT DETECTED Final   Staphylococcus species NOT DETECTED NOT DETECTED Final   Staphylococcus aureus (BCID) NOT DETECTED NOT DETECTED Final   Staphylococcus epidermidis NOT DETECTED NOT DETECTED Final   Staphylococcus lugdunensis NOT DETECTED NOT  DETECTED Final   Streptococcus species DETECTED (A) NOT DETECTED Final    Comment: CRITICAL RESULT CALLED TO, READ BACK BY AND VERIFIED WITH: PHARMD ELIZABETH M. [phone] [id number] FCP    Streptococcus agalactiae NOT DETECTED NOT DETECTED Final   Streptococcus pneumoniae NOT DETECTED NOT DETECTED Final   Streptococcus pyogenes DETECTED (A) NOT DETECTED Final    Comment: CRITICAL RESULT CALLED TO, READ BACK BY AND VERIFIED WITH: PHARMD ELIZABETH M. [phone] FCP    A.calcoaceticus-baumannii NOT DETECTED NOT DETECTED Final   Bacteroides fragilis NOT DETECTED NOT DETECTED Final   Enterobacterales NOT DETECTED NOT DETECTED Final   Enterobacter cloacae complex NOT DETECTED NOT DETECTED Final   Escherichia coli NOT DETECTED NOT DETECTED Final   Klebsiella aerogenes NOT DETECTED NOT DETECTED Final   Klebsiella oxytoca NOT DETECTED NOT DETECTED Final   Klebsiella pneumoniae NOT DETECTED NOT DETECTED Final   Proteus species NOT DETECTED NOT DETECTED Final   Salmonella species NOT DETECTED NOT DETECTED Final   Serratia marcescens NOT DETECTED NOT DETECTED Final   Haemophilus influenzae NOT DETECTED NOT DETECTED Final   Neisseria meningitidis NOT DETECTED NOT DETECTED Final   Pseudomonas aeruginosa NOT DETECTED NOT DETECTED Final   Stenotrophomonas maltophilia NOT DETECTED NOT DETECTED Final   Candida albicans NOT DETECTED NOT DETECTED Final   Candida auris NOT DETECTED NOT DETECTED Final   Candida glabrata NOT DETECTED NOT DETECTED Final   Candida krusei NOT DETECTED NOT DETECTED Final   Candida parapsilosis NOT DETECTED NOT DETECTED Final   Candida tropicalis NOT DETECTED NOT DETECTED Final   Cryptococcus neoformans/gattii NOT DETECTED NOT DETECTED Final    Comment: Performed at [facility], [address], [place], [place] [address]  MRSA Next Gen by PCR, Nasal     Status: Abnormal   Collection Time: 07/16/23  9:37 PM   Specimen: Nasal Mucosa; Nasal Swab  Result Value Ref Range Status    MRSA by PCR Next  Gen DETECTED (A) NOT DETECTED Final    Comment: RESULT CALLED TO, READ BACK BY AND VERIFIED WITH: B SCOTT  RN 07/16/2023 @ 2325 BY AB (NOTE) The GeneXpert MRSA Assay (FDA approved for NASAL specimens only), is one component of a comprehensive MRSA colonization surveillance program. It is not intended to diagnose MRSA infection nor to guide or monitor treatment for MRSA infections. Test performance is not FDA approved in patients less than 22 years old. Performed at [facility], [address] [place], [place] [address]   Blood culture (routine x 2)     Status: None   Collection Time: 07/16/23 10:36 PM   Specimen: BLOOD RIGHT HAND  Result Value Ref Range Status   Specimen Description BLOOD RIGHT HAND  Final   Special Requests   Final    BOTTLES DRAWN AEROBIC AND ANAEROBIC Blood Culture results may not be optimal due to an inadequate volume of blood received in culture bottles   Culture   Final    NO GROWTH 5 DAYS Performed at [facility], [address], [place], [place] [address]    Report Status 07/21/2023 FINAL  Final  Urine Culture (for pregnant, neutropenic or urologic patients or patients with an indwelling urinary catheter)     Status: Abnormal   Collection Time: 07/17/23  7:33 AM   Specimen: Urine, Catheterized  Result Value Ref Range Status   Specimen Description URINE, CATHETERIZED  Final   Special Requests   Final    NONE Performed at [facility], [address] [place], [place] [address]    Culture MULTIPLE SPECIES PRESENT, SUGGEST RECOLLECTION (A)  Final   Report Status 07/18/2023 FINAL  Final  Respiratory (~20 pathogens) panel by PCR     Status: Abnormal   Collection Time: 07/17/23 10:58 AM   Specimen: Nasopharyngeal Swab; Respiratory  Result Value Ref Range Status   Adenovirus NOT DETECTED NOT DETECTED Final   Coronavirus 229E NOT DETECTED NOT DETECTED Final    Comment: (NOTE) The Coronavirus on the Respiratory Panel,  DOES NOT test for the novel  Coronavirus (2019 nCoV)    Coronavirus HKU1 NOT DETECTED NOT DETECTED Final   Coronavirus NL63 NOT DETECTED NOT DETECTED Final   Coronavirus OC43 NOT DETECTED NOT DETECTED Final   Metapneumovirus NOT DETECTED NOT DETECTED Final   Rhinovirus / Enterovirus DETECTED (A) NOT DETECTED Final   Influenza A NOT DETECTED NOT DETECTED Final   Influenza B NOT DETECTED NOT DETECTED Final   Parainfluenza Virus 1 NOT DETECTED NOT DETECTED Final   Parainfluenza Virus 2 NOT DETECTED NOT DETECTED Final   Parainfluenza Virus 3 NOT DETECTED NOT DETECTED Final   Parainfluenza Virus 4 NOT DETECTED NOT DETECTED Final   Respiratory Syncytial Virus NOT DETECTED NOT DETECTED Final   Bordetella pertussis NOT DETECTED NOT DETECTED Final   Bordetella Parapertussis NOT DETECTED NOT DETECTED Final   Chlamydophila pneumoniae NOT DETECTED NOT DETECTED Final   Mycoplasma pneumoniae NOT DETECTED NOT DETECTED Final    Comment: Performed at [facility], [address], [place], [place] [address]  Culture, Respiratory w Gram Stain     Status: None   Collection Time: 07/17/23 11:14 AM   Specimen: Bronchoalveolar Lavage; Respiratory  Result Value Ref Range Status   Specimen Description BRONCHIAL ALVEOLAR LAVAGE  Final   Special Requests Normal  Final   Gram Stain   Final    ABUNDANT WBC PRESENT, PREDOMINANTLY PMN NO ORGANISMS SEEN Performed at [facility], [address] [place], [place] [address]    Culture RARE METHICILLIN RESISTANT STAPHYLOCOCCUS AUREUS  Final   Report Status 07/19/2023 FINAL  Final   Organism ID, Bacteria METHICILLIN RESISTANT STAPHYLOCOCCUS AUREUS  Final      Susceptibility   Methicillin resistant staphylococcus aureus - MIC*    CIPROFLOXACIN 4 RESISTANT Resistant     ERYTHROMYCIN >=8 RESISTANT Resistant     GENTAMICIN <=0.5 SENSITIVE Sensitive     OXACILLIN >=4 RESISTANT Resistant     TETRACYCLINE <=1 SENSITIVE Sensitive     VANCOMYCIN 1 SENSITIVE  Sensitive     TRIMETH/SULFA 160 RESISTANT Resistant     CLINDAMYCIN <=0.25 SENSITIVE Sensitive     RIFAMPIN <=0.5 SENSITIVE Sensitive     Inducible Clindamycin NEGATIVE Sensitive     LINEZOLID 2 SENSITIVE Sensitive     * RARE METHICILLIN RESISTANT STAPHYLOCOCCUS AUREUS  Acid Fast Smear (AFB)     Status: None   Collection Time: 07/17/23 11:14 AM   Specimen: Bronchoalveolar Lavage; Respiratory  Result Value Ref Range Status   AFB Specimen Processing Concentration  Final   Acid Fast Smear Negative  Final    Comment: (NOTE) Performed At: [facility] [address] [place], [place] [id number] Jolene Schimke MD XB:[id number]    Source (AFB) BRONCHIAL ALVEOLAR LAVAGE  Final    Comment: Performed at [facility], [address] [place], [place] [address]  Blood Culture ID Panel (Reflexed)     Status: Abnormal   Collection Time: 07/17/23  6:57 PM  Result Value Ref Range Status   Enterococcus faecalis NOT DETECTED NOT DETECTED Final   Enterococcus Faecium NOT DETECTED NOT DETECTED Final   Listeria monocytogenes NOT DETECTED NOT DETECTED Final   Staphylococcus species DETECTED (A) NOT DETECTED Final    Comment: CRITICAL RESULT CALLED TO, READ BACK BY AND VERIFIED WITH: PHARMD JESSICA MILLEN ON 07/17/23 @ 1700 BY DRT    Staphylococcus aureus (BCID) NOT DETECTED NOT DETECTED Final   Staphylococcus epidermidis DETECTED (A) NOT DETECTED Final    Comment: Methicillin (oxacillin) resistant coagulase negative staphylococcus. Possible blood culture contaminant (unless isolated from more than one blood culture draw or clinical case suggests pathogenicity). No antibiotic treatment is indicated for blood  culture contaminants. PHARMD JESSICA MILLEN ON 07/17/23 @ 1700 BY DRT    Staphylococcus lugdunensis NOT DETECTED NOT DETECTED Final   Streptococcus species NOT DETECTED NOT DETECTED Final   Streptococcus agalactiae NOT DETECTED NOT DETECTED Final   Streptococcus pneumoniae NOT  DETECTED NOT DETECTED Final   Streptococcus pyogenes NOT DETECTED NOT DETECTED Final   A.calcoaceticus-baumannii NOT DETECTED NOT DETECTED Final   Bacteroides fragilis NOT DETECTED NOT DETECTED Final   Enterobacterales NOT DETECTED NOT DETECTED Final   Enterobacter cloacae complex NOT DETECTED NOT DETECTED Final   Escherichia coli NOT DETECTED NOT DETECTED Final   Klebsiella aerogenes NOT DETECTED NOT DETECTED Final   Klebsiella oxytoca NOT DETECTED NOT DETECTED Final   Klebsiella pneumoniae NOT DETECTED NOT DETECTED Final   Proteus species NOT DETECTED NOT DETECTED Final   Salmonella species NOT DETECTED NOT DETECTED Final   Serratia marcescens NOT DETECTED NOT DETECTED Final   Haemophilus influenzae NOT DETECTED NOT DETECTED Final   Neisseria meningitidis NOT DETECTED NOT DETECTED Final   Pseudomonas aeruginosa NOT DETECTED NOT DETECTED Final   Stenotrophomonas maltophilia NOT DETECTED NOT DETECTED Final   Candida albicans NOT DETECTED NOT DETECTED Final   Candida auris NOT DETECTED NOT DETECTED Final   Candida glabrata  NOT DETECTED NOT DETECTED Final   Candida krusei NOT DETECTED NOT DETECTED Final   Candida parapsilosis NOT DETECTED NOT DETECTED Final   Candida tropicalis NOT DETECTED NOT DETECTED Final   Cryptococcus neoformans/gattii NOT DETECTED NOT DETECTED Final   Methicillin resistance mecA/C DETECTED (A) NOT DETECTED Final    Comment: CRITICAL RESULT CALLED TO, READ BACK BY AND VERIFIED WITH: PHARMD JESSICA MILLEN ON 07/17/23 @ 1700 BY DRT Performed at [facility], [address], [place], [place] [address]   C Difficile Quick Screen (NO PCR Reflex)     Status: None   Collection Time: 07/20/23  7:27 AM   Specimen: STOOL  Result Value Ref Range Status   C Diff antigen NEGATIVE NEGATIVE Final   C Diff toxin NEGATIVE NEGATIVE Final   C Diff interpretation No C. difficile detected.  Final    Comment: Performed at [facility], [address] [place],  [place] [address]  Culture, blood (Routine X 2) w Reflex to ID Panel     Status: None (Preliminary result)   Collection Time: 07/20/23  9:42 AM   Specimen: BLOOD RIGHT ARM  Result Value Ref Range Status   Specimen Description BLOOD RIGHT ARM  Final   Special Requests   Final    AEROBIC BOTTLE ONLY Blood Culture results may not be optimal due to an inadequate volume of blood received in culture bottles   Culture   Final    NO GROWTH 3 DAYS Performed at [facility], [address] [place], [place] [address]    Report Status PENDING  Incomplete  Culture, blood (Routine X 2) w Reflex to ID Panel     Status: None (Preliminary result)   Collection Time: 07/20/23  9:44 AM   Specimen: BLOOD LEFT HAND  Result Value Ref Range Status   Specimen Description BLOOD LEFT HAND  Final   Special Requests   Final    BOTTLES DRAWN AEROBIC AND ANAEROBIC Blood Culture adequate volume   Culture   Final    NO GROWTH 3 DAYS Performed at [facility], [address], [place], [place] [address]    Report Status PENDING  Incomplete     Serology:   Imaging: If present, new imagings (plain films, ct scans, and mri) have been personally visualized and interpreted; radiology reports have been reviewed. Decision making incorporated into the Impression / Recommendations.   12/20 ct abd pelv noncontrast 1. Bilateral percutaneous nephrostomies. No hydronephrosis. 2. Progression of bilateral pulmonary opacities. 3.  Aortic Atherosclerosis   12/20 tte  1. Left ventricular ejection fraction, by estimation, is 70 to 75%. The  left ventricle has hyperdynamic function. The left ventricle has no  regional wall motion abnormalities. Left ventricular diastolic parameters  are indeterminate.   2. Right ventricular systolic function is normal. The right ventricular  size is normal. Tricuspid regurgitation signal is inadequate for assessing  PA pressure.   3. The mitral valve is normal in structure. Trivial  mitral valve  regurgitation. No evidence of mitral stenosis.   4. The aortic valve is grossly normal. Aortic valve regurgitation is not  visualized. No aortic stenosis is present.   5. The inferior vena cava is normal in size with greater than 50%  respiratory variability, suggesting right atrial pressure of 3 mmHg.   Conclusion(s)/Recommendation(s): No evidence of valvular vegetations on  this transthoracic echocardiogram. Consider a transesophageal  echocardiogram to exclude infective endocarditis if clinically indicated.  12/20 cxr FINDINGS: The heart size and mediastinal contours are within normal limits. Endotracheal and nasogastric tubes are unchanged. Right internal jugular catheter is unchanged. Mildly decreased bibasilar opacities are noted suggesting improving pneumonia or edema. The visualized skeletal structures are unremarkable.   IMPRESSION: Stable support apparatus. Mildly improved bibasilar opacities as noted above.    07/17/23 cxr IMPRESSION: 1. Satisfactory position of endotracheal tube. 2. No acute cardiopulmonary abnormalities.     12/16 head ct 1. No acute intracranial abnormality. 2. Diffuse, moderately severe paranasal sinus disease.   12/16 ct abd pelv chest 1. Bilateral centrilobular micro nodules and tree-in-bud opacities greatest in the bilateral lower lobes compatible with small airway infection/inflammation and/or aspiration. 2. Small amount of pneumomediastinum. 3. Thick-walled nondistended urinary bladder about a Foley catheter. Correlate with urinalysis to exclude cystitis. 4. Bilateral ureteral stents. Bilateral hydronephrosis is similar to 08/05/2021. 5. Similar prominent retroperitoneal lymph nodes and decreased inguinal lymph nodes.       Raymondo Band, MD [facility] [phone] pager    07/23/2023, 2:56 PM

## 2023-07-24 DIAGNOSIS — J9691 Respiratory failure, unspecified with hypoxia: Secondary | ICD-10-CM | POA: Diagnosis not present

## 2023-07-24 DIAGNOSIS — A419 Sepsis, unspecified organism: Secondary | ICD-10-CM | POA: Diagnosis not present

## 2023-07-24 DIAGNOSIS — N390 Urinary tract infection, site not specified: Secondary | ICD-10-CM | POA: Diagnosis not present

## 2023-07-24 DIAGNOSIS — R6521 Severe sepsis with septic shock: Secondary | ICD-10-CM | POA: Diagnosis not present

## 2023-07-24 LAB — RENAL FUNCTION PANEL
Albumin: <1.5 g/dL — ABNORMAL LOW (ref 3.5–5.0)
Albumin: 1.5 g/dL — ABNORMAL LOW (ref 3.5–5.0)
Anion gap: 9 (ref 5–15)
Anion gap: 12 (ref 5–15)
BUN: 28 mg/dL — ABNORMAL HIGH (ref 6–20)
BUN: 46 mg/dL — ABNORMAL HIGH (ref 6–20)
CO2: 25 mmol/L (ref 22–32)
CO2: 23 mmol/L (ref 22–32)
Calcium: 8.3 mg/dL — ABNORMAL LOW (ref 8.9–10.3)
Calcium: 8.5 mg/dL — ABNORMAL LOW (ref 8.9–10.3)
Chloride: 96 mmol/L — ABNORMAL LOW (ref 98–111)
Chloride: 98 mmol/L (ref 98–111)
Creatinine, Ser: 1.46 mg/dL — ABNORMAL HIGH (ref 0.44–1.00)
Creatinine, Ser: 2.65 mg/dL — ABNORMAL HIGH (ref 0.44–1.00)
GFR, Estimated: 46 mL/min — ABNORMAL LOW (ref >60)
GFR, Estimated: 22 mL/min — ABNORMAL LOW (ref >60)
Glucose, Bld: 151 mg/dL — ABNORMAL HIGH (ref 70–99)
Glucose, Bld: 131 mg/dL — ABNORMAL HIGH (ref 70–99)
Phosphorus: 3.8 mg/dL (ref 2.5–4.6)
Phosphorus: 5.6 mg/dL — ABNORMAL HIGH (ref 2.5–4.6)
Potassium: 4 mmol/L (ref 3.5–5.1)
Potassium: 4.1 mmol/L (ref 3.5–5.1)
Sodium: 130 mmol/L — ABNORMAL LOW (ref 135–145)
Sodium: 133 mmol/L — ABNORMAL LOW (ref 135–145)

## 2023-07-24 LAB — HEPATIC FUNCTION PANEL
ALT: 722 U/L — ABNORMAL HIGH (ref 0–44)
AST: 1078 U/L — ABNORMAL HIGH (ref 15–41)
Albumin: <1.5 g/dL — ABNORMAL LOW (ref 3.5–5.0)
Alkaline Phosphatase: 100 U/L (ref 38–126)
Bilirubin, Direct: <0.1 mg/dL (ref 0.0–0.2)
Total Bilirubin: 0.5 mg/dL (ref <1.2)
Total Protein: 8.7 g/dL — ABNORMAL HIGH (ref 6.5–8.1)

## 2023-07-24 LAB — CBC
HCT: 23.2 % — ABNORMAL LOW (ref 36.0–46.0)
Hemoglobin: 6.8 g/dL — CL (ref 12.0–15.0)
MCH: 30.8 pg (ref 26.0–34.0)
MCHC: 29.3 g/dL — ABNORMAL LOW (ref 30.0–36.0)
MCV: 105 fL — ABNORMAL HIGH (ref 80.0–100.0)
Platelets: 301 10*3/uL (ref 150–400)
RBC: 2.21 MIL/uL — ABNORMAL LOW (ref 3.87–5.11)
RDW: 18.8 % — ABNORMAL HIGH (ref 11.5–15.5)
WBC: 32.6 10*3/uL — ABNORMAL HIGH (ref 4.0–10.5)
nRBC: 1 % — ABNORMAL HIGH (ref 0.0–0.2)

## 2023-07-24 LAB — APTT: aPTT: 33 s (ref 24–36)

## 2023-07-24 LAB — PREPARE RBC (CROSSMATCH)

## 2023-07-24 LAB — LACTIC ACID, PLASMA: Lactic Acid, Venous: 1.4 mmol/L (ref 0.5–1.9)

## 2023-07-24 LAB — MAGNESIUM: Magnesium: 2.6 mg/dL — ABNORMAL HIGH (ref 1.7–2.4)

## 2023-07-24 LAB — GLUCOSE, CAPILLARY
Glucose-Capillary: 104 mg/dL — ABNORMAL HIGH (ref 70–99)
Glucose-Capillary: 132 mg/dL — ABNORMAL HIGH (ref 70–99)
Glucose-Capillary: 136 mg/dL — ABNORMAL HIGH (ref 70–99)
Glucose-Capillary: 136 mg/dL — ABNORMAL HIGH (ref 70–99)
Glucose-Capillary: 145 mg/dL — ABNORMAL HIGH (ref 70–99)
Glucose-Capillary: 147 mg/dL — ABNORMAL HIGH (ref 70–99)
Glucose-Capillary: 148 mg/dL — ABNORMAL HIGH (ref 70–99)

## 2023-07-24 LAB — CK: Total CK: 617 U/L — ABNORMAL HIGH (ref 38–234)

## 2023-07-24 MED ORDER — SODIUM CHLORIDE 0.9% IV SOLUTION: Freq: Once | INTRAVENOUS | Status: AC

## 2023-07-24 MED ORDER — NAPROXEN 250 MG PO TABS
250.0000 mg | ORAL_TABLET | Freq: Once | ORAL | Status: AC
  Administered 2023-07-24: 250 mg via NASOGASTRIC
  Filled 2023-07-24: qty 1

## 2023-07-24 MED ORDER — SODIUM ZIRCONIUM CYCLOSILICATE 10 G PO PACK
10.0000 g | PACK | Freq: Every day | ORAL | Status: DC
  Administered 2023-07-24: 10 g
  Filled 2023-07-24: qty 1

## 2023-07-24 NOTE — Progress Notes (Addendum)
[facility] NEPHROLOGY PROGRESS NOTE  Admit: 07/16/2023 LOS: 8  60F with chronic obstructive uropathy with b/l stents, hx/o cervical CA, CKD4 here with AKI, metabolic acidosis, hyperkalemia, AMS, VDRF, septic shock from ?UTI  Current CRRT Prescription: Start Date: 07/17/23 Catheter: R internal jugular Temp HD cath placed 07/16/23 CCM BFR: 200-250 Pre Blood Pump: 2K DFR: 2k Replacement Rate: 2K Goal UF: net neg 26mL/h-100mL/hr Anticoagulation: fixed dose heparin Clotting: events occurring infrequently  S: Seen and examined in ICU/on CRRT. Off propofol given c/f PRIS, pressor requirements decreased. Ran net even overnight. Net positive ~0.7L, total ~1.5L since admit Started lokelma yesterday for persistent hyperK despite changes in CRRT bath Pressors: NE Uop charted 40cc Fio2 40%-stable Family meeting planned for today Now DNR  O: 12/23 0701 - 12/24 0700 In: 3453.4 [I.V.:1323.4; NG/GT:1675; IV Piggyback:335] Out: 2715 [Urine:40; Stool:650]  BP (!) 104/59   Pulse (!) 102   Temp (!) 97.4 F (36.3 C) (Oral)   Resp (!) 28   Ht 5\' 6"  (1.676 m)   Wt 67.1 kg   SpO2 100%   BMI 23.88 kg/m   Gen: ill appearing, intubated/sedated CV: RRR Resp: mech breath sounds Abd: soft Ext: b/l ext wounds (dressings in place), no sig edema b/l le;s Neuro: sedated Dialysis access: RIJ temp HD catheter in use  Filed Weights   07/22/23 0500 07/23/23 0400 07/24/23 0500  Weight: 65.3 kg 66.5 kg 67.1 kg    Recent Labs  Lab 07/23/23 0255 07/23/23 1537 07/23/23 1558 07/24/23 0457  NA 131* 130* 132* 130*  K 5.3* 6.4* 5.6* 4.0  CL 99 93*  --  96*  CO2 23 23  --  25  GLUCOSE 153* 112*  --  151*  BUN 25* 24*  --  28*  CREATININE 1.23* 1.32*  --  1.46*  CALCIUM 8.2* 8.8*  --  8.3*  PHOS 3.1 5.1*  --  3.8   Recent Labs  Lab 07/19/23 0656 07/19/23 1431 07/23/23 0254 07/23/23 1548 07/23/23 1558 07/24/23 0455  WBC 50.0*   < > 40.9* 45.0*  --  32.6*  NEUTROABS  45.5*  --   --  35.1*  --   --   HGB 6.4*   < > 7.2* 7.4* 9.9* 6.8*  HCT 20.9*   < > 25.2* 26.4* 29.0* 23.2*  MCV 96.8   < > 103.3* 106.0*  --  105.0*  PLT 229   < > 309 PLATELET CLUMPS NOTED ON SMEAR, UNABLE TO ESTIMATE  --  301   < > = values in this interval not displayed.    Scheduled Meds:  sodium chloride   Intravenous Once   sodium chloride   Intravenous Once   Chlorhexidine Gluconate Cloth  6 each Topical Daily   clonazePAM  1 mg Per Tube TID   etomidate  0.3 mg/kg Intravenous Once   feeding supplement (PROSource TF20)  60 mL Per Tube BID   fiber supplement (BANATROL TF)  60 mL Per Tube BID   heparin injection (subcutaneous)  5,000 Units Subcutaneous Q8H   insulin aspart  0-9 Units Subcutaneous Q4H   multivitamin  1 tablet Per Tube QHS   mouth rinse  15 mL Mouth Rinse Q2H   oxyCODONE  10 mg Per Tube Q6H   pantoprazole (PROTONIX) IV  40 mg Intravenous QHS   sodium chloride flush  5 mL Intracatheter Q8H   sodium zirconium cyclosilicate  10 g Per Tube Daily   Continuous Infusions:  feeding supplement (VITAL 1.5  CAL) 1,000 mL (07/24/23 0805)   fentaNYL infusion INTRAVENOUS 125 mcg/hr (07/24/23 0800)   heparin 10,000 units/ 20 mL infusion syringe 350 Units/hr (07/23/23 1535)   midazolam 2 mg/hr (07/24/23 0800)   norepinephrine (LEVOPHED) Adult infusion 10 mcg/min (07/24/23 0800)   pencillin G potassium IV 3 Million Units (07/24/23 0809)   prismasol BGK 2/2.5 dialysis solution 1,500 mL/hr at 07/24/23 0704   prismasol BGK 2/2.5 replacement solution 400 mL/hr at 07/23/23 2358   prismasol BGK 2/2.5 replacement solution 400 mL/hr at 07/23/23 1836   vasopressin 0.03 Units/min (07/24/23 0800)   PRN Meds:.acetaminophen, fentaNYL, heparin, heparin, midazolam, midazolam, mouth rinse  ABG    Component Value Date/Time   PHART 7.280 (L) 07/23/2023 1558   PCO2ART 50.1 (H) 07/23/2023 1558   PO2ART 93 07/23/2023 1558   HCO3 23.6 07/23/2023 1558   TCO2 25 07/23/2023 1558    ACIDBASEDEF 3.0 (H) 07/23/2023 1558   O2SAT 96 07/23/2023 1558    A 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 On CRRT and lokelma per tube Metabolic acidosis, lactic acidosis Anemia Intermittent Hypophsophatemia from CRRT, stable today Rhinovirus PNA Polysubstance abuse, IVDA Hx/o cervical cancer Elevated LFTs-per primary service Concern for PRIS-off propofol 12/23  P Family meeting planned for today per PCCM. C/w CRRT at current prescription, will aim for UF goals of net neg 50-100cc/hr. Not ready to transition off CRRT at this time Decrease lokelma to 10g per tube daily Given c/f PRIS, would check CK Cont fixed dose heparin Discussed with ICU RN and primary service  ADDENDUM 2:10pm: discussed with primary service. Planning on transition to comfort care tomorrow (waiting on more family and friends to come see her). Therefore, off CRRT. I met with the patient's mother at the bedside. Would not restart CRRT. If any changes in clinical status, would recommend calling family first (at the request of patient's mom).  Anthony Sar, MD  [facility]

## 2023-07-24 NOTE — Progress Notes (Signed)
[facility]  Date of Admission:  07/16/2023     Lines:  12/17-c bilateral nephrostomy tubes 12/16-c right internal jugular hd catheter     Abx: 12/19-c penicillin g  12/17-12/23 linezolid 12/16-19 cefepime 12/17-12/18 metronidazole 12/16 vanc                                                           Assessment: 42 yo female with hx cervical cancer s/p chemoxrt and brachytherapy (unclear status of disease), presence of bilateral ureteral stent for distal ureteral stricture/obstructive uropathy (stents in for a year), ckd stage 4, admitted with acute ams, abd pain, hematuria, found to have:   Septic shock -- bp 42/16 on presentation cellulitis Group a strep bacteremia Hypoxic respiratory failure -- intubated on presentation in setting septic shock Ct imaging distended bladder and tree in bud bilateral basal lungs  AKI     12/16 mrsa nares pcr positive 12/17 ucx in progress 12/17 respiratory culture (BAL) in progress 12/16 bcx 1 of 2 set with group a strep by bcid   Source for group a strep might be lower ext open wound/cellulitis. No sign of nec fasc now but would need to closely monitor   Doesn't appear to have toxic shock syndrome at this time     She has chronic intermittent dysuria in setting of cervical cancer/xrt and bilateral ureteral stricture. No clinical change per history. Ct showed stable hydronephrosis   Bilateral basilar chest ct finding tree in bud ?aspiration. S/p BAL sampling     At this time covering broadly with abx    ------------ 12/18 id assessment Off pressors this morning Repeat bcx 12/17 CoNS likely contaminant 12/17 BAL cx ngtd 12/17 s/p ureteral stent removal bilaterally and placement perc-nephrostomy tubes 12/17 respiratory viral pcr rhino virus  12/17 ucx multiple species    Bilateral LE open wounds/scabs and well marked erythema without fluctuance/blister.   Suspect source of  infection/sepsis mainly driven by group a strep process with rhinovirus minorly contribute and explaining tree in bud changes on chest imaging   Urine process chronic and doesn't appear to contribute  Off crrt for the latter part of today   12/19 id assessment Crrt restarted and there was concern for membranes being clotted off with failed blood return --> hb 6's Remains off levophed Rash in legs stable no sign of nec fasc  1 episode of diarrhea today 12/19 am. Wbc up to 50 but maybe reactive due to noninfectious cause. Will monitor Abdomen soft Chest xray for the most part clear with nonspecific bibasilar opacity  Bal cx from 12/17 rare mrsa   Repeat bcx 12/16 showed CoNS contaminant  ----------- 07/20/23 id assessment Remains on crrt Afebrile Sbp 100s-110s No rash on exam but mild lft yesterday; bilateral LE cellulitis looks clinicaly improved Diarrhea likely tube feed/abx related -- cdiff testing negative Stable low vent level requirement -- no sign of HAP; cxr improved basilar opacities Pcn's draining well but query if some intraabd/renal abscess had developed  Moderate eosinophilia new 12/19 and leukemoid reaction ?adrenal insufficiency  Repeat bcx sent per primary team today 07/20/23   Pending tte   ----------------- 12/23 id assessment Remains on crrt Back on pressors Worsening transaminitis ?acute liver failure --> vs abx/medication side effect (  there was eosinophilia on 12/19) such as DRESS ?shock liver from septic shock (12/20 tte 70% ef) with delayed improvement ?budd chiary ?acute viral hepatitis Wbc peaked at 60s and has been trending down Borderline blood pressure on crrt Repeat abd pelc ct (noncontrast unrevealing); repeat bcx negative Bilateral lower ext cellulitis changes better  Some concern for vasculitis like lesion in the lower legs by my partner in setting possible cocaine use. Conservative care would be management  Previous random cortisol  level normal   ---------------- 12/24 id assessment Propofol stopped and improvement of lft as well as hemodynamics Dress w/u so far negative  Renal concern the lft's elevation could be rhabdo (AST >> ALT)   Again group A strep infection appears well controlled.  Reviewed ct and hx --> no known cirrhosis  Acute hepatitis serology not concerning  Plan:  Continue penicillin g for now Family meeting planned per pulm ccm Other supportive care per pulm ccm Discussed with primary team         Principal Problem:   Group A streptococcal infection Active Problems:   Sepsis due to urinary tract infection (HCC)   Acute respiratory failure (HCC)   Altered mental status   Protein-calorie malnutrition, severe   Allergies  Allergen Reactions   Imitrex [Sumatriptan Base] Palpitations    Heart races   Cephalexin Rash    Patient passed amoxicillin challenge on 08/18/20, no adverse effect    Scheduled Meds:  sodium chloride   Intravenous Once   Chlorhexidine Gluconate Cloth  6 each Topical Daily   clonazePAM  1 mg Per Tube TID   etomidate  0.3 mg/kg Intravenous Once   feeding supplement (PROSource TF20)  60 mL Per Tube BID   fiber supplement (BANATROL TF)  60 mL Per Tube BID   heparin injection (subcutaneous)  5,000 Units Subcutaneous Q8H   insulin aspart  0-9 Units Subcutaneous Q4H   multivitamin  1 tablet Per Tube QHS   mouth rinse  15 mL Mouth Rinse Q2H   oxyCODONE  10 mg Per Tube Q6H   pantoprazole (PROTONIX) IV  40 mg Intravenous QHS   sodium chloride flush  5 mL Intracatheter Q8H   sodium zirconium cyclosilicate  10 g Per Tube Daily   Continuous Infusions:  feeding supplement (VITAL 1.5 CAL) 50 mL/hr at 07/24/23 1200   fentaNYL infusion INTRAVENOUS 125 mcg/hr (07/24/23 1200)   heparin 10,000 units/ 20 mL infusion syringe 350 Units/hr (07/23/23 1535)   midazolam 2 mg/hr (07/24/23 1200)   norepinephrine (LEVOPHED) Adult infusion 6 mcg/min (07/24/23 1040)    pencillin G potassium IV 100 mL/hr at 07/24/23 1000   prismasol BGK 2/2.5 dialysis solution 1,500 mL/hr at 07/24/23 0906   prismasol BGK 2/2.5 replacement solution 400 mL/hr at 07/23/23 2358   prismasol BGK 2/2.5 replacement solution 400 mL/hr at 07/23/23 1836   vasopressin Stopped (07/24/23 0803)   PRN Meds:.acetaminophen, fentaNYL, heparin, heparin, midazolam, midazolam, mouth rinse   SUBJECTIVE: Propofol stopped  Wbc down  Lft improving  Hemodynamics improving  Afebrile  Family meeting planned      Review of Systems: ROS All other ROS was negative, except mentioned above     OBJECTIVE: Vitals:   07/24/23 1130 07/24/23 1145 07/24/23 1200 07/24/23 1215  BP: (!) 99/57 104/62 115/61 104/60  Pulse: (!) 111 (!) 111 (!) 115 (!) 112  Resp: (!) 27 (!) 34 (!) 30 (!) 28  Temp:      TempSrc:      SpO2: 99% 100% 100%  99%  Weight:      Height:       Body mass index is 23.88 kg/m.  Physical Exam General/constitutional: acutely ill appearing, comatose/sedated; older than stated age; cachectic HEENT: Normocephalic CV: rrr no mrg Lungs: clear to auscultation, normal respiratory effort Abd: Soft, Nontender Ext: trace ext edema; bilateral LE dressing clean/dry Skin: no other rash Neuro: sedated/comatose MSK: no peripheral joint swelling/warmth  Central line presence: right internal jugular hd cath site no redness/purulence   Lab Results Lab Results  Component Value Date   WBC 32.6 (H) 07/24/2023   HGB 6.8 (LL) 07/24/2023   HCT 23.2 (L) 07/24/2023   MCV 105.0 (H) 07/24/2023   PLT 301 07/24/2023    Lab Results  Component Value Date   CREATININE 1.46 (H) 07/24/2023   BUN 28 (H) 07/24/2023   NA 130 (L) 07/24/2023   K 4.0 07/24/2023   CL 96 (L) 07/24/2023   CO2 25 07/24/2023    Lab Results  Component Value Date   ALT 722 (H) 07/24/2023   AST 1,078 (H) 07/24/2023   ALKPHOS 100 07/24/2023   BILITOT 0.5 07/24/2023      Microbiology: Recent Results  (from the past 240 hours)  Resp panel by RT-PCR (RSV, Flu A&B, Covid) Anterior Nasal Swab     Status: None   Collection Time: 07/16/23  4:49 PM   Specimen: Anterior Nasal Swab  Result Value Ref Range Status   SARS Coronavirus 2 by RT PCR NEGATIVE NEGATIVE Final   Influenza A by PCR NEGATIVE NEGATIVE Final   Influenza B by PCR NEGATIVE NEGATIVE Final    Comment: (NOTE) The Xpert Xpress SARS-CoV-2/FLU/RSV plus assay is intended as an aid in the diagnosis of influenza from Nasopharyngeal swab specimens and should not be used as a sole basis for treatment. Nasal washings and aspirates are unacceptable for Xpert Xpress SARS-CoV-2/FLU/RSV testing.  Fact Sheet for Patients: BloggerCourse.com  Fact Sheet for Healthcare Providers: SeriousBroker.it  This test is not yet approved or cleared by the [place] FDA and has been authorized for detection and/or diagnosis of SARS-CoV-2 by FDA under an Emergency Use Authorization (EUA). This EUA will remain in effect (meaning this test can be used) for the duration of the COVID-19 declaration under Section 564(b)(1) of the Act, 21 U.S.C. section 360bbb-3(b)(1), unless the authorization is terminated or revoked.     Resp Syncytial Virus by PCR NEGATIVE NEGATIVE Final    Comment: (NOTE) Fact Sheet for Patients: BloggerCourse.com  Fact Sheet for Healthcare Providers: SeriousBroker.it  This test is not yet approved or cleared by the [place] FDA and has been authorized for detection and/or diagnosis of SARS-CoV-2 by FDA under an Emergency Use Authorization (EUA). This EUA will remain in effect (meaning this test can be used) for the duration of the COVID-19 declaration under Section 564(b)(1) of the Act, 21 U.S.C. section 360bbb-3(b)(1), unless the authorization is terminated or revoked.  Performed at [facility], [address] [place], [place] [address]   Blood culture (routine x 2)     Status: Abnormal   Collection Time: 07/16/23  4:50 PM   Specimen: BLOOD  Result Value Ref Range Status   Specimen Description BLOOD SITE NOT SPECIFIED  Final   Special Requests   Final    BOTTLES DRAWN AEROBIC ONLY Blood Culture adequate volume   Culture  Setup Time   Final    GRAM POSITIVE COCCI IN CHAINS ANAEROBIC BOTTLE ONLY CRITICAL RESULT CALLED TO, READ BACK  BY AND VERIFIED WITH: PHARMD ELIZABETH M. [phone] [id number] FCP GRAM POSITIVE COCCI IN CLUSTERS BOTTLES DRAWN AEROBIC ONLY CRITICAL RESULT CALLED TO, READ BACK BY AND VERIFIED WITH: PHARMD JESSICA MILLEN ON 07/17/23 @ 1723 BY DRT    Culture (A)  Final    STREPTOCOCCUS PYOGENES STAPHYLOCOCCUS EPIDERMIDIS THE SIGNIFICANCE OF ISOLATING THIS ORGANISM FROM A SINGLE SET OF BLOOD CULTURES WHEN MULTIPLE SETS ARE DRAWN IS UNCERTAIN. PLEASE NOTIFY THE MICROBIOLOGY DEPARTMENT WITHIN ONE WEEK IF SPECIATION AND SENSITIVITIES ARE REQUIRED. HEALTH DEPARTMENT NOTIFIED Performed at [facility], [address] [place], [place], [place] [address]    Report Status 07/19/2023 FINAL  Final   Organism ID, Bacteria STREPTOCOCCUS PYOGENES  Final      Susceptibility   Streptococcus pyogenes - MIC*    PENICILLIN <=0.06 SENSITIVE Sensitive     CEFTRIAXONE <=0.12 SENSITIVE Sensitive     ERYTHROMYCIN <=0.12 SENSITIVE Sensitive     LEVOFLOXACIN 2 SENSITIVE Sensitive     VANCOMYCIN 0.5 SENSITIVE Sensitive     * STREPTOCOCCUS PYOGENES  Blood Culture ID Panel (Reflexed)     Status: Abnormal   Collection Time: 07/16/23  4:50 PM  Result Value Ref Range Status   Enterococcus faecalis NOT DETECTED NOT DETECTED Final   Enterococcus Faecium NOT DETECTED NOT DETECTED Final   Listeria monocytogenes NOT DETECTED NOT DETECTED Final   Staphylococcus species NOT DETECTED NOT DETECTED Final   Staphylococcus aureus (BCID) NOT DETECTED NOT DETECTED Final   Staphylococcus epidermidis NOT DETECTED NOT DETECTED  Final   Staphylococcus lugdunensis NOT DETECTED NOT DETECTED Final   Streptococcus species DETECTED (A) NOT DETECTED Final    Comment: CRITICAL RESULT CALLED TO, READ BACK BY AND VERIFIED WITH: PHARMD ELIZABETH M. [id number] FCP    Streptococcus agalactiae NOT DETECTED NOT DETECTED Final   Streptococcus pneumoniae NOT DETECTED NOT DETECTED Final   Streptococcus pyogenes DETECTED (A) NOT DETECTED Final    Comment: CRITICAL RESULT CALLED TO, READ BACK BY AND VERIFIED WITH: PHARMD ELIZABETH M. [id number] FCP    A.calcoaceticus-baumannii NOT DETECTED NOT DETECTED Final   Bacteroides fragilis NOT DETECTED NOT DETECTED Final   Enterobacterales NOT DETECTED NOT DETECTED Final   Enterobacter cloacae complex NOT DETECTED NOT DETECTED Final   Escherichia coli NOT DETECTED NOT DETECTED Final   Klebsiella aerogenes NOT DETECTED NOT DETECTED Final   Klebsiella oxytoca NOT DETECTED NOT DETECTED Final   Klebsiella pneumoniae NOT DETECTED NOT DETECTED Final   Proteus species NOT DETECTED NOT DETECTED Final   Salmonella species NOT DETECTED NOT DETECTED Final   Serratia marcescens NOT DETECTED NOT DETECTED Final   Haemophilus influenzae NOT DETECTED NOT DETECTED Final   Neisseria meningitidis NOT DETECTED NOT DETECTED Final   Pseudomonas aeruginosa NOT DETECTED NOT DETECTED Final   Stenotrophomonas maltophilia NOT DETECTED NOT DETECTED Final   Candida albicans NOT DETECTED NOT DETECTED Final   Candida auris NOT DETECTED NOT DETECTED Final   Candida glabrata NOT DETECTED NOT DETECTED Final   Candida krusei NOT DETECTED NOT DETECTED Final   Candida parapsilosis NOT DETECTED NOT DETECTED Final   Candida tropicalis NOT DETECTED NOT DETECTED Final   Cryptococcus neoformans/gattii NOT DETECTED NOT DETECTED Final    Comment: Performed at [facility], [address] [place], [place] [address]  MRSA Next Gen by PCR, Nasal     Status: Abnormal   Collection Time: 07/16/23  9:37 PM   Specimen:  Nasal Mucosa; Nasal Swab  Result Value Ref Range Status   MRSA by  PCR Next Gen DETECTED (A) NOT DETECTED Final    Comment: RESULT CALLED TO, READ BACK BY AND VERIFIED WITH: B SCOTT  RN 07/16/2023 @ 2325 BY AB (NOTE) The GeneXpert MRSA Assay (FDA approved for NASAL specimens only), is one component of a comprehensive MRSA colonization surveillance program. It is not intended to diagnose MRSA infection nor to guide or monitor treatment for MRSA infections. Test performance is not FDA approved in patients less than 56 years old. Performed at [facility], [address], [place], [place] [address]   Blood culture (routine x 2)     Status: None   Collection Time: 07/16/23 10:36 PM   Specimen: BLOOD RIGHT HAND  Result Value Ref Range Status   Specimen Description BLOOD RIGHT HAND  Final   Special Requests   Final    BOTTLES DRAWN AEROBIC AND ANAEROBIC Blood Culture results may not be optimal due to an inadequate volume of blood received in culture bottles   Culture   Final    NO GROWTH 5 DAYS Performed at [facility], [address], [place], [place] [address]    Report Status 07/21/2023 FINAL  Final  Urine Culture (for pregnant, neutropenic or urologic patients or patients with an indwelling urinary catheter)     Status: Abnormal   Collection Time: 07/17/23  7:33 AM   Specimen: Urine, Catheterized  Result Value Ref Range Status   Specimen Description URINE, CATHETERIZED  Final   Special Requests   Final    NONE Performed at [facility], [address] [place], [place] [address]    Culture MULTIPLE SPECIES PRESENT, SUGGEST RECOLLECTION (A)  Final   Report Status 07/18/2023 FINAL  Final  Respiratory (~20 pathogens) panel by PCR     Status: Abnormal   Collection Time: 07/17/23 10:58 AM   Specimen: Nasopharyngeal Swab; Respiratory  Result Value Ref Range Status   Adenovirus NOT DETECTED NOT DETECTED Final   Coronavirus 229E NOT DETECTED NOT DETECTED Final     Comment: (NOTE) The Coronavirus on the Respiratory Panel, DOES NOT test for the novel  Coronavirus (2019 nCoV)    Coronavirus HKU1 NOT DETECTED NOT DETECTED Final   Coronavirus NL63 NOT DETECTED NOT DETECTED Final   Coronavirus OC43 NOT DETECTED NOT DETECTED Final   Metapneumovirus NOT DETECTED NOT DETECTED Final   Rhinovirus / Enterovirus DETECTED (A) NOT DETECTED Final   Influenza A NOT DETECTED NOT DETECTED Final   Influenza B NOT DETECTED NOT DETECTED Final   Parainfluenza Virus 1 NOT DETECTED NOT DETECTED Final   Parainfluenza Virus 2 NOT DETECTED NOT DETECTED Final   Parainfluenza Virus 3 NOT DETECTED NOT DETECTED Final   Parainfluenza Virus 4 NOT DETECTED NOT DETECTED Final   Respiratory Syncytial Virus NOT DETECTED NOT DETECTED Final   Bordetella pertussis NOT DETECTED NOT DETECTED Final   Bordetella Parapertussis NOT DETECTED NOT DETECTED Final   Chlamydophila pneumoniae NOT DETECTED NOT DETECTED Final   Mycoplasma pneumoniae NOT DETECTED NOT DETECTED Final    Comment: Performed at [facility], [address], [place], [place] [address]  Culture, Respiratory w Gram Stain     Status: None   Collection Time: 07/17/23 11:14 AM   Specimen: Bronchoalveolar Lavage; Respiratory  Result Value Ref Range Status   Specimen Description BRONCHIAL ALVEOLAR LAVAGE  Final   Special Requests Normal  Final   Gram Stain   Final    ABUNDANT WBC PRESENT, PREDOMINANTLY PMN NO ORGANISMS SEEN Performed  at [facility], [address] [place], [place] [address]    Culture RARE METHICILLIN RESISTANT STAPHYLOCOCCUS AUREUS  Final   Report Status 07/19/2023 FINAL  Final   Organism ID, Bacteria METHICILLIN RESISTANT STAPHYLOCOCCUS AUREUS  Final      Susceptibility   Methicillin resistant staphylococcus aureus - MIC*    CIPROFLOXACIN 4 RESISTANT Resistant     ERYTHROMYCIN >=8 RESISTANT Resistant     GENTAMICIN <=0.5 SENSITIVE Sensitive     OXACILLIN >=4 RESISTANT Resistant      TETRACYCLINE <=1 SENSITIVE Sensitive     VANCOMYCIN 1 SENSITIVE Sensitive     TRIMETH/SULFA 160 RESISTANT Resistant     CLINDAMYCIN <=0.25 SENSITIVE Sensitive     RIFAMPIN <=0.5 SENSITIVE Sensitive     Inducible Clindamycin NEGATIVE Sensitive     LINEZOLID 2 SENSITIVE Sensitive     * RARE METHICILLIN RESISTANT STAPHYLOCOCCUS AUREUS  Acid Fast Smear (AFB)     Status: None   Collection Time: 07/17/23 11:14 AM   Specimen: Bronchoalveolar Lavage; Respiratory  Result Value Ref Range Status   AFB Specimen Processing Concentration  Final   Acid Fast Smear Negative  Final    Comment: (NOTE) Performed At: [facility] [address] [place], [place] [id number] Jolene Schimke MD XB:[id number]    Source (AFB) BRONCHIAL ALVEOLAR LAVAGE  Final    Comment: Performed at [facility], [address] [place], [place] [address]  Blood Culture ID Panel (Reflexed)     Status: Abnormal   Collection Time: 07/17/23  6:57 PM  Result Value Ref Range Status   Enterococcus faecalis NOT DETECTED NOT DETECTED Final   Enterococcus Faecium NOT DETECTED NOT DETECTED Final   Listeria monocytogenes NOT DETECTED NOT DETECTED Final   Staphylococcus species DETECTED (A) NOT DETECTED Final    Comment: CRITICAL RESULT CALLED TO, READ BACK BY AND VERIFIED WITH: PHARMD JESSICA MILLEN ON 07/17/23 @ 1700 BY DRT    Staphylococcus aureus (BCID) NOT DETECTED NOT DETECTED Final   Staphylococcus epidermidis DETECTED (A) NOT DETECTED Final    Comment: Methicillin (oxacillin) resistant coagulase negative staphylococcus. Possible blood culture contaminant (unless isolated from more than one blood culture draw or clinical case suggests pathogenicity). No antibiotic treatment is indicated for blood  culture contaminants. PHARMD JESSICA MILLEN ON 07/17/23 @ 1700 BY DRT    Staphylococcus lugdunensis NOT DETECTED NOT DETECTED Final   Streptococcus species NOT DETECTED NOT DETECTED Final   Streptococcus agalactiae NOT  DETECTED NOT DETECTED Final   Streptococcus pneumoniae NOT DETECTED NOT DETECTED Final   Streptococcus pyogenes NOT DETECTED NOT DETECTED Final   A.calcoaceticus-baumannii NOT DETECTED NOT DETECTED Final   Bacteroides fragilis NOT DETECTED NOT DETECTED Final   Enterobacterales NOT DETECTED NOT DETECTED Final   Enterobacter cloacae complex NOT DETECTED NOT DETECTED Final   Escherichia coli NOT DETECTED NOT DETECTED Final   Klebsiella aerogenes NOT DETECTED NOT DETECTED Final   Klebsiella oxytoca NOT DETECTED NOT DETECTED Final   Klebsiella pneumoniae NOT DETECTED NOT DETECTED Final   Proteus species NOT DETECTED NOT DETECTED Final   Salmonella species NOT DETECTED NOT DETECTED Final   Serratia marcescens NOT DETECTED NOT DETECTED Final   Haemophilus influenzae NOT DETECTED NOT DETECTED Final   Neisseria meningitidis NOT DETECTED NOT DETECTED Final   Pseudomonas aeruginosa NOT DETECTED NOT DETECTED Final   Stenotrophomonas maltophilia NOT DETECTED NOT DETECTED Final   Candida albicans NOT DETECTED NOT DETECTED Final   Candida auris NOT DETECTED NOT DETECTED Final  Candida glabrata NOT DETECTED NOT DETECTED Final   Candida krusei NOT DETECTED NOT DETECTED Final   Candida parapsilosis NOT DETECTED NOT DETECTED Final   Candida tropicalis NOT DETECTED NOT DETECTED Final   Cryptococcus neoformans/gattii NOT DETECTED NOT DETECTED Final   Methicillin resistance mecA/C DETECTED (A) NOT DETECTED Final    Comment: CRITICAL RESULT CALLED TO, READ BACK BY AND VERIFIED WITH: PHARMD JESSICA MILLEN ON 07/17/23 @ 1700 BY DRT Performed at [facility], [address] [place], [place] [address]   C Difficile Quick Screen (NO PCR Reflex)     Status: None   Collection Time: 07/20/23  7:27 AM   Specimen: STOOL  Result Value Ref Range Status   C Diff antigen NEGATIVE NEGATIVE Final   C Diff toxin NEGATIVE NEGATIVE Final   C Diff interpretation No C. difficile detected.  Final    Comment:  Performed at [facility], [address], [place], [place] [address]  Culture, blood (Routine X 2) w Reflex to ID Panel     Status: None (Preliminary result)   Collection Time: 07/20/23  9:42 AM   Specimen: BLOOD RIGHT ARM  Result Value Ref Range Status   Specimen Description BLOOD RIGHT ARM  Final   Special Requests   Final    AEROBIC BOTTLE ONLY Blood Culture results may not be optimal due to an inadequate volume of blood received in culture bottles   Culture   Final    NO GROWTH 4 DAYS Performed at [facility], [address] [place], [place] [address]    Report Status PENDING  Incomplete  Culture, blood (Routine X 2) w Reflex to ID Panel     Status: None (Preliminary result)   Collection Time: 07/20/23  9:44 AM   Specimen: BLOOD LEFT HAND  Result Value Ref Range Status   Specimen Description BLOOD LEFT HAND  Final   Special Requests   Final    BOTTLES DRAWN AEROBIC AND ANAEROBIC Blood Culture adequate volume   Culture   Final    NO GROWTH 4 DAYS Performed at [facility], [address] [place], [place] [address]    Report Status PENDING  Incomplete     Serology:   Imaging: If present, new imagings (plain films, ct scans, and mri) have been personally visualized and interpreted; radiology reports have been reviewed. Decision making incorporated into the Impression / Recommendations.   12/20 ct abd pelv noncontrast 1. Bilateral percutaneous nephrostomies. No hydronephrosis. 2. Progression of bilateral pulmonary opacities. 3.  Aortic Atherosclerosis   12/20 tte  1. Left ventricular ejection fraction, by estimation, is 70 to 75%. The  left ventricle has hyperdynamic function. The left ventricle has no  regional wall motion abnormalities. Left ventricular diastolic parameters  are indeterminate.   2. Right ventricular systolic function is normal. The right ventricular  size is normal. Tricuspid regurgitation signal is inadequate for assessing  PA  pressure.   3. The mitral valve is normal in structure. Trivial mitral valve  regurgitation. No evidence of mitral stenosis.   4. The aortic valve is grossly normal. Aortic valve regurgitation is not  visualized. No aortic stenosis is present.   5. The inferior vena cava is normal in size with greater than 50%  respiratory variability, suggesting right atrial pressure of 3 mmHg.   Conclusion(s)/Recommendation(s): No evidence of valvular vegetations on  this transthoracic echocardiogram. Consider a transesophageal  echocardiogram to exclude infective endocarditis if clinically indicated.  12/20 cxr FINDINGS: The heart size and mediastinal contours are within normal limits. Endotracheal and nasogastric tubes are unchanged. Right internal jugular catheter is unchanged. Mildly decreased bibasilar opacities are noted suggesting improving pneumonia or edema. The visualized skeletal structures are unremarkable.   IMPRESSION: Stable support apparatus. Mildly improved bibasilar opacities as noted above.    07/17/23 cxr IMPRESSION: 1. Satisfactory position of endotracheal tube. 2. No acute cardiopulmonary abnormalities.     12/16 head ct 1. No acute intracranial abnormality. 2. Diffuse, moderately severe paranasal sinus disease.   12/16 ct abd pelv chest 1. Bilateral centrilobular micro nodules and tree-in-bud opacities greatest in the bilateral lower lobes compatible with small airway infection/inflammation and/or aspiration. 2. Small amount of pneumomediastinum. 3. Thick-walled nondistended urinary bladder about a Foley catheter. Correlate with urinalysis to exclude cystitis. 4. Bilateral ureteral stents. Bilateral hydronephrosis is similar to 08/05/2021. 5. Similar prominent retroperitoneal lymph nodes and decreased inguinal lymph nodes.       Raymondo Band, MD [facility] [phone] pager    07/24/2023, 12:44  PM

## 2023-07-24 NOTE — Progress Notes (Signed)
Critical hemoglobin of 6.8 called into [facility] MD.  Per Pola Corn MD, day team to address hemoglobin due to pt being asymptomatic, pressor requirements trending down and family meeting later today.

## 2023-07-24 NOTE — Progress Notes (Signed)
RN called RT stating pts SpO2 decreased to 80's while on CPAP/PS. RN felt comfortable placing pt back on FS with previous settings. RN read back settings back to RT. RT now at bedside with RN to find pt with increased WOB/tachypneic and decreased SpO2. Pt is now on 100% FiO2.

## 2023-07-24 NOTE — IPAL (Signed)
  Interdisciplinary Goals of Care Family Meeting   Date carried out: 07/24/2023  Location of the meeting: Conference room  Member's involved: Physician, Bedside Registered Nurse, and Family Member or next of kin  Durable Power of Attorney or acting medical decision maker: Mellody Dance husband, Dianne mother, Ricky brother.     Discussion: We discussed goals of care for Marie Brewer .  Discussed her poor health prior to this hospital stay, and her lack of desire for seeking medical care due to being "stubborn." Discussed her septic shock and multi-organ failure. High Likelihood for passing away this admission, but in a best case scenario would be in an LTAC and then nursing home, very low chance of returning to home with independent living. Family expresses grief with this news, but acknowledge she would not want to live like this. They would like to give her a little more time for additional family to see her.   Code status:   Code Status: Do not attempt resuscitation (DNR) PRE-ARREST INTERVENTIONS DESIRED   Disposition: Continue current acute care. Expect transition to comfort care measures 9am on 12/25 but would call husband and mother if there is clinical decline before then. Will not resume CRRT at this time.   Time spent for the meeting: 40 minutes.    Charlott Holler, MD  07/24/2023, 1:14 PM

## 2023-07-24 NOTE — Progress Notes (Signed)
eLink Physician-Brief Progress Note Patient Name: Marie Brewer DOB: April 15, 1981 MRN: [id number]   Date of Service  07/24/2023  HPI/Events of Note  Fever 101.2  Elevated AST/ALT Going for comfort care in AM.   S/p PRBC earlier, family do not want to repeat.   eICU Interventions  Ice packs, cooling blanks. One dose Naproxen via NG tube.      Intervention Category Intermediate Interventions: Other:  Ranee Gosselin 07/24/2023, 7:44 PM

## 2023-07-24 NOTE — Progress Notes (Addendum)
NAME:  Marie Brewer, MRN:  [id number], DOB:  02-11-1981, LOS: 8 ADMISSION DATE:  07/16/2023, CONSULTATION DATE:  07/16/23 REFERRING MD:  [facility]-EDP, CHIEF COMPLAINT:  Sepsis, presumed urosepsis   History of present illness   42 year old woman who presented to [facility] ED 12/16 for unresponsiveness. PMHx significant for CKD stage 4, bilateral ureteral obstruction (s/p stent placement 10/2022), cervical CA stage IIB (s/p chemotherapy/XRT, followed at [facility]), polysubstance abuse (current heroin use via inhalation).   Patient presented to [facility] ED with decreased responsiveness x 3 days, per patient's husband (at bedside). Has not been feeling well for about a month but is often hesitant to come to the ED and was adamantly refusing going to the hospital. PO intake has been poor, has been only drinking [facility] and not eating much. No reported fevers at home, but chills/general malaise. Intermittent nausea, no vomiting/diarrhea. Per husband, has complained of abdominal pain/pain with urination. Of note, has bilateral ureteral stents placed in 03/2022; she was supposed to follow up for stent exchange 01/2023 but did not present as scheduled. Additionally, husband notes she continues to use heroin via inhalation, denies injection.   On ED arrival, patient was afebrile, HR 111, BP 126/80, SpO2 100%. She was initially somnolent with GCS 12 but intermittently agitated/not following command, thrashing and causing risk to herself/staff. Ultimately patient was intubated for her safety/airway protection. Labs were notable for WBC 46.1, Hgb 10.8, Plt 704 (suspect concentrated), Na 130, K 5.7, CO2 < 7, BUN 158/Cr 12.13 (baseline ~4). Transaminases/Tbili WNL, Alk Phos mildly elevated. Lipase 133. TSH 4.046. LA 6.1 > 0.8 after IV fluids. CK 198. Ammonia 79, Ethanol < 10, APAP/salicylates negative. UDS pending. VBG 6.974/pCO2 23.4/bicarb 5.4. PCT 29.14, MRSA PCR positive (nares). BCx pending, cefepime/vanc initiated. CT  Chest/A/P demonstrated bilateral centrilobular micronodules (tree-in-bud) greatest in bilateral lower lobes, small amount pneumomediastinum (history of esophageal perf), thick-walled nondistended urinary bladder with Foley, bilateral hydronephrosis with bilateral ureteral stents.   PCCM consulted for ICU admission. Nephro consulted for ARF and emergent CRRT initiation.  Past Medical History  CKD stage IV Bilateral ureteral obstruction status post stent placement April 2024 Cervical cancer stage IIb s/p chemotherapy and radiation Heroin use  Significant Hospital Events   12/16 - Presented to [facility] ED with unresponsiveness, decreased PO intake. Unresponsiveness with intermittent agitation requiring intubation. Found to be in acute renal failure with Cr 12 (baseline ~4), BUN > 100. WBC 46. Briefly required Levophed prior to intubation. Broad-spectrum antibiotic started (cefepime/vanc). RIJ Trialysis catheter placed. CRRT started per Nephro. 12/17- IR placed bilateral percutaneous nephrostomy tubes and removed bilateral ureteral stents 12/19 started enteral methodone 12/20 did not tolerate coming of CRRT for a trial of iHD due to electrolyte derrangements 12/22 switched methadone to oxycodone due to transaminitis  Interim history/subjective:  Stopped propofol last night. Now on 9 mcg of norepinephrine.   Objective   Blood pressure 111/63, pulse (!) 102, temperature (!) 97.4 F (36.3 C), temperature source Oral, resp. rate (!) 22, height 5\' 6"  (1.676 m), weight 67.1 kg, SpO2 100%.    Vent Mode: PSV;CPAP FiO2 (%):  [40 %] 40 % Set Rate:  [32 bmp] 32 bmp Vt Set:  [470 mL] 470 mL PEEP:  [5 cmH20] 5 cmH20 Pressure Support:  [5 cmH20-10 cmH20] 10 cmH20 Plateau Pressure:  [21 cmH20-25 cmH20] 25 cmH20   Intake/Output Summary (Last 24 hours) at 07/24/2023 0816 Last data filed at 07/24/2023 0800 Gross per 24 hour  Intake 3391.79 ml  Output 2666 ml  Net  725.79 ml    Filed Weights   07/22/23  0500 07/23/23 0400 07/24/23 0500  Weight: 65.3 kg 66.5 kg 67.1 kg    Examination: Gen:      Intubated, sedated, acutely ill appearing, cachectic HEENT:  ETT to vent Lungs:    sounds of mechanical ventilation auscultated no wheeze CV:         tachycardic, regular Abd:      + bowel sounds; soft, non-tender; no palpable masses, no distension Ext:    No edema Skin:      lower extremity wounds in bandages Neuro:   sedated, RASS -4, moves all 4 extremities    Labs/imaging reviewed    Na 130 K 4.0 Cr 1.46 Mg 2.6 AST 1078 ALT 722  WBC 32.6 Hgb 6.8  Resolved Hospital Problem list   Severe metabolic acidosis Uremia Hyperkalemia  Assessment & Plan:  42 y.o. year old female with pmh of cervical cancer s/p chemoradiation complicated by ureteral stenosis with stent placement, heroin abuse who present altered mental status and admitted to ICU 12/16 for septic shock.   Septic Shock Group A strep bacteremia, Bilateral lower extremity wounds Rhinovirus/MRSA pneumonia History of bilateral ureteral obstruction s/p nephrostomy tube placement Leukocytosis -continue penicillin G and stopped linezolid yesterday for toxin suppression/,mrsa pna -strict I and Os - slow to resolve septic shock may have been secondary to PRIS.  - appreciate ID assistance  Concern for PRIS - propofol stopped 12/23 evening and switched to versed.  - monitor LFTs, lactic acid, CK - pressors requirements have come down significantly since stopping propofol  Oliguric Acute renal failure, dependent on RRT CKD stage IV - continue crrt per nephrology. Volume removal today as tolerated to faciliate veniltator liberation -trend RFP this PM -strict I and Os -avoid nephrotoxic agents  Acute hypoxic respiratory failure Concern for aspiration pneumonitis - passed SBT, mental status barrier to extubation today, SBT today -Full vent support (4-8cc/kg IBWW) -wean FiO2 for O2 sat>90% -Daily WUA/SBT once appropriate from  a mental status standpoint  -VAP bundle -PAD protocol: versed and fentanyl RASS -1 to -2 - continue oxycodone 10q6. Will add klonopin to help reduce IV sedation and facilitate potential ventilator liberation  Acute on chronic anemia - likely secondary to sepsis, renal failure - transfuse 1 unt prbg today for hgb <7  History of polysubstance abuse with current heroin use, inhalation Husband endorses heroin use. High risk for withdrawal symptoms.  -UDS positive for cocaine  Diabetes - cbgs at goal  Small volume pneumomediastinum Small amount of pneumomediastinum seen on CT chest 12/16. Has history of esophageal perforation and inhales heroin. Repeat CXR 12/20 without abnormabilities. - Monitor closely for now, repeat chest xray if clinically indicated  History of cervical cancer Stage IIb, status post chemoradiation diagnosed in 2011.  Followed with Dr. Bertis Ruddy at [facility], last seen 3/23 with recommendations for CT abd/pelvis with 1/23 CT showing lymphadenopathy. CT 07/16/23 with similar prominent retroperitoneal and decreased inguinal lymph nodes. -continue outpatient follow-up  Best practice:  Diet: NPO, tube feeds Pain/Anxiety/Delirium protocol (if indicated): propofol, fentanyl DVT prophylaxis: Heparin GI prophylaxis: PPI Glucose control: SSI Code Status: DNR Family Communication: family meeting at noon 12/24.  The patient is critically ill due to shock, respiratory failure.  Critical care was necessary to treat or prevent imminent or life-threatening deterioration.  Critical care was time spent personally by me on the following activities: development of treatment plan with patient and/or surrogate as well as nursing, discussions with consultants, evaluation of patient's  response to treatment, examination of patient, obtaining history from patient or surrogate, ordering and performing treatments and interventions, ordering and review of laboratory studies, ordering and review of  radiographic studies, pulse oximetry, re-evaluation of patient's condition and participation in multidisciplinary rounds.   Critical Care Time devoted to patient care services described in this note is 36 minutes. This time reflects time of care of this signee Charlott Holler . This critical care time does not reflect separately billable procedures or procedure time, teaching time or supervisory time of PA/NP/Med student/Med Resident etc but could involve care discussion time.       Charlott Holler [facility] 07/24/2023 8:22 AM  Pager: see AMION  If no response to pager , please call critical care on call (see AMION) until 7pm After 7:00 pm call Elink

## 2023-07-25 LAB — CULTURE, BLOOD (ROUTINE X 2)
Culture: NO GROWTH
Special Requests: ADEQUATE

## 2023-07-25 LAB — GLUCOSE, CAPILLARY
Glucose-Capillary: 131 mg/dL — ABNORMAL HIGH (ref 70–99)
Glucose-Capillary: 103 mg/dL — ABNORMAL HIGH (ref 70–99)
Glucose-Capillary: 109 mg/dL — ABNORMAL HIGH (ref 70–99)

## 2023-07-25 LAB — BPAM RBC
Blood Product Expiration Date: 202501192359
ISSUE DATE / TIME: 202412241052
Unit Type and Rh: 9500

## 2023-07-25 LAB — TYPE AND SCREEN
ABO/RH(D): O NEG
Antibody Screen: NEGATIVE
Unit division: 0

## 2023-07-25 MED ORDER — MORPHINE 100MG IN NS 100ML (1MG/ML) PREMIX INFUSION
0.0000 mg/h | INTRAVENOUS | Status: DC
Start: 2023-07-25 — End: 2023-07-25
  Administered 2023-07-25: 40 mg/h via INTRAVENOUS

## 2023-07-25 MED ORDER — ACETAMINOPHEN 325 MG PO TABS: 650.0000 mg | ORAL_TABLET | Freq: Four times a day (QID) | ORAL | Status: DC | PRN

## 2023-07-25 MED ORDER — GLYCOPYRROLATE 0.2 MG/ML IJ SOLN
0.2000 mg | INTRAMUSCULAR | Status: DC | PRN
  Administered 2023-07-25: 0.2 mg via INTRAVENOUS
  Filled 2023-07-25: qty 1

## 2023-07-25 MED ORDER — GLYCOPYRROLATE 1 MG PO TABS: 1.0000 mg | ORAL_TABLET | ORAL | Status: DC | PRN

## 2023-07-25 MED ORDER — POLYVINYL ALCOHOL 1.4 % OP SOLN: 1.0000 [drp] | Freq: Four times a day (QID) | OPHTHALMIC | Status: DC | PRN

## 2023-07-25 MED ORDER — MORPHINE BOLUS VIA INFUSION
5.0000 mg | INTRAVENOUS | Status: DC | PRN
Start: 2023-07-25 — End: 2023-07-25

## 2023-07-25 MED ORDER — MORPHINE 100MG IN NS 100ML (1MG/ML) PREMIX INFUSION
INTRAVENOUS | Status: AC
  Administered 2023-07-25: 100 mg
  Filled 2023-07-25: qty 100

## 2023-07-25 MED ORDER — LORAZEPAM 2 MG/ML IJ SOLN
2.0000 mg | INTRAMUSCULAR | Status: DC | PRN
  Filled 2023-07-25: qty 2

## 2023-07-25 MED ORDER — MORPHINE 100MG IN NS 100ML (1MG/ML) PREMIX INFUSION
INTRAVENOUS | Status: AC
  Administered 2023-07-25: 5 mg/h via INTRAVENOUS
  Filled 2023-07-25: qty 100

## 2023-07-25 MED ORDER — SODIUM CHLORIDE 0.9 % IV SOLN
8.0000 mg/h | INTRAVENOUS | Status: DC
  Administered 2023-07-25: 20 mg/h via INTRAVENOUS
  Administered 2023-07-25: 8 mg/h via INTRAVENOUS
  Administered 2023-07-26 – 2023-07-27 (×4): 20 mg/h via INTRAVENOUS
  Filled 2023-07-25 (×2): qty 20
  Filled 2023-07-25: qty 10
  Filled 2023-07-25 (×3): qty 20
  Filled 2023-07-25: qty 5
  Filled 2023-07-25 (×2): qty 20

## 2023-07-25 MED ORDER — GLYCOPYRROLATE 0.2 MG/ML IJ SOLN: 0.2000 mg | INTRAMUSCULAR | Status: DC | PRN

## 2023-07-25 MED ORDER — ACETAMINOPHEN 650 MG RE SUPP
650.0000 mg | Freq: Four times a day (QID) | RECTAL | Status: DC | PRN
  Filled 2023-07-25: qty 1

## 2023-07-25 MED ORDER — SODIUM CHLORIDE 0.9% FLUSH: 3.0000 mL | INTRAVENOUS | Status: DC | PRN

## 2023-07-25 MED ORDER — SODIUM CHLORIDE 0.9% FLUSH
3.0000 mL | Freq: Two times a day (BID) | INTRAVENOUS | Status: DC
  Administered 2023-07-25 – 2023-07-27 (×4): 10 mL via INTRAVENOUS

## 2023-07-25 MED ORDER — MORPHINE 100MG IN NS 100ML (1MG/ML) PREMIX INFUSION: 0.0000 mg/h | INTRAVENOUS | Status: DC

## 2023-07-25 NOTE — Progress Notes (Signed)
[facility] NEPHROLOGY PROGRESS NOTE  Admit: 07/16/2023 LOS: 9  62F with chronic obstructive uropathy with b/l stents, hx/o cervical CA, CKD4 here with AKI, metabolic acidosis, hyperkalemia, AMS, VDRF, septic shock from ?UTI  S: Seen and examined in ICU. Off CRRT. Plan on transition to comfort care this AM. Discussed with ICU RN. DNR  CRRT Prescription: Start Date: 07/17/23. Stopped CRRT 07/24/23 Catheter: R internal jugular Temp HD cath placed 07/16/23 CCM BFR: 200-250 Pre Blood Pump: 2K DFR: 2k Replacement Rate: 2K Goal UF: net neg 44mL/h-100mL/hr Anticoagulation: fixed dose heparin Clotting: events occurring infrequently  O: 12/24 0701 - 12/25 0700 In: 2760.7 [I.V.:593.7; Blood:362; NG/GT:1390; IV Piggyback:415] Out: 407 [Urine:35; Stool:200]  BP (!) 101/57 (BP Location: Right Arm)   Pulse (!) 103   Temp 97.8 F (36.6 C) (Axillary)   Resp (!) 32   Ht 5\' 6"  (1.676 m)   Wt 68 kg   SpO2 99%   BMI 24.20 kg/m   Gen: ill appearing, intubated/sedated CV: RRR Resp: mech breath sounds Abd: soft Ext: b/l ext wounds (dressings in place), no sig edema b/l le;s Neuro: sedated Dialysis access: RIJ temp HD catheter in use  Filed Weights   07/23/23 0400 07/24/23 0500 07/25/23 0500  Weight: 66.5 kg 67.1 kg 68 kg    Recent Labs  Lab 07/23/23 1537 07/23/23 1558 07/24/23 0457 07/24/23 1559  NA 130* 132* 130* 133*  K 6.4* 5.6* 4.0 4.1  CL 93*  --  96* 98  CO2 23  --  25 23  GLUCOSE 112*  --  151* 131*  BUN 24*  --  28* 46*  CREATININE 1.32*  --  1.46* 2.65*  CALCIUM 8.8*  --  8.3* 8.5*  PHOS 5.1*  --  3.8 5.6*   Recent Labs  Lab 07/19/23 0656 07/19/23 1431 07/23/23 0254 07/23/23 1548 07/23/23 1558 07/24/23 0455  WBC 50.0*   < > 40.9* 45.0*  --  32.6*  NEUTROABS 45.5*  --   --  35.1*  --   --   HGB 6.4*   < > 7.2* 7.4* 9.9* 6.8*  HCT 20.9*   < > 25.2* 26.4* 29.0* 23.2*  MCV 96.8   < > 103.3* 106.0*  --  105.0*  PLT 229   < > 309 PLATELET  CLUMPS NOTED ON SMEAR, UNABLE TO ESTIMATE  --  301   < > = values in this interval not displayed.    Scheduled Meds:  sodium chloride   Intravenous Once   Chlorhexidine Gluconate Cloth  6 each Topical Daily   clonazePAM  1 mg Per Tube TID   etomidate  0.3 mg/kg Intravenous Once   feeding supplement (PROSource TF20)  60 mL Per Tube BID   fiber supplement (BANATROL TF)  60 mL Per Tube BID   heparin injection (subcutaneous)  5,000 Units Subcutaneous Q8H   insulin aspart  0-9 Units Subcutaneous Q4H   multivitamin  1 tablet Per Tube QHS   mouth rinse  15 mL Mouth Rinse Q2H   oxyCODONE  10 mg Per Tube Q6H   pantoprazole (PROTONIX) IV  40 mg Intravenous QHS   sodium chloride flush  5 mL Intracatheter Q8H   sodium zirconium cyclosilicate  10 g Per Tube Daily   Continuous Infusions:  feeding supplement (VITAL 1.5 CAL) 50 mL/hr at 07/25/23 0700   fentaNYL infusion INTRAVENOUS 125 mcg/hr (07/25/23 0700)   heparin 10,000 units/ 20 mL infusion syringe 350 Units/hr (07/23/23 1535)   midazolam 4  mg/hr (07/25/23 0700)   norepinephrine (LEVOPHED) Adult infusion 8 mcg/min (07/25/23 0700)   pencillin G potassium IV 100 mL/hr at 07/25/23 0700   prismasol BGK 2/2.5 dialysis solution 1,500 mL/hr at 07/24/23 0906   prismasol BGK 2/2.5 replacement solution 400 mL/hr at 07/23/23 2358   prismasol BGK 2/2.5 replacement solution 400 mL/hr at 07/23/23 1836   vasopressin Stopped (07/24/23 0803)   PRN Meds:.fentaNYL, heparin, heparin, midazolam, midazolam, mouth rinse  ABG    Component Value Date/Time   PHART 7.280 (L) 07/23/2023 1558   PCO2ART 50.1 (H) 07/23/2023 1558   PO2ART 93 07/23/2023 1558   HCO3 23.6 07/23/2023 1558   TCO2 25 07/23/2023 1558   ACIDBASEDEF 3.0 (H) 07/23/2023 1558   O2SAT 96 07/23/2023 1558    A 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 On CRRT and lokelma per tube Metabolic acidosis, lactic acidosis Anemia Intermittent Hypophsophatemia from CRRT, stable today Rhinovirus PNA Polysubstance abuse, IVDA Hx/o cervical cancer Elevated LFTs-per primary service Concern for PRIS-off propofol 12/23  P Off CRRT with plans to transition to comfort care later this morning. No labs. Will sign off from a nephrology perspective. Please call back if plans change. Discussed with ICU RN and primary service  Anthony Sar, MD  [facility]

## 2023-07-25 NOTE — Procedures (Signed)
Extubation Procedure Note  Patient Details:   Name: Marie Brewer DOB: Feb 03, 1981 MRN: [id number]   Airway Documentation:    Vent end date: 07/25/23 Vent end time: 1517   Evaluation  O2 sats: transiently fell during during procedure Complications: No apparent complications Patient did tolerate procedure well. Bilateral Breath Sounds: Diminished   No  Pt had an compassionate extubation per order.  Kerri Perches 07/25/2023, 3:17 PM

## 2023-07-25 NOTE — TOC CM/SW Note (Signed)
Transition of Care [facility]) - Inpatient Brief Assessment   Patient Details  Name: Marie Brewer MRN: [id number] Date of Birth: 23-Dec-1980  Transition of Care [facility]) CM/SW Contact:    Marliss Coots, LCSW Phone Number: 07/25/2023, 2:08 PM   Clinical Narrative:  2:08 PM Per chart review, patient is now DNR-comfort care and continues to be on a ventilator. TOC will continue to follow and be available.  Transition of Care Asessment: Insurance and Status: Insurance coverage has been reviewed Patient has primary care physician: No Home environment has been reviewed: Optometrist Home Services: No current home services Social Drivers of Health Review:  (Patient unable to answer) Readmission risk has been reviewed: Yes Transition of care needs:  (TOC will continue to follow)

## 2023-07-25 NOTE — Progress Notes (Signed)
NAME:  Marie Brewer, MRN:  [id number], DOB:  April 30, 1981, LOS: 9 ADMISSION DATE:  07/16/2023, CONSULTATION DATE:  07/16/23 REFERRING MD:  Countryman-EDP, CHIEF COMPLAINT:  Sepsis, presumed urosepsis   History of present illness   42 year old woman who presented to [facility] ED 12/16 for unresponsiveness. PMHx significant for CKD stage 4, bilateral ureteral obstruction (s/p stent placement 10/2022), cervical CA stage IIB (s/p chemotherapy/XRT, followed at [facility]), polysubstance abuse (current heroin use via inhalation).   Patient presented to [facility] ED with decreased responsiveness x 3 days, per patient's husband (at bedside). Has not been feeling well for about a month but is often hesitant to come to the ED and was adamantly refusing going to the hospital. PO intake has been poor, has been only drinking [facility] and not eating much. No reported fevers at home, but chills/general malaise. Intermittent nausea, no vomiting/diarrhea. Per husband, has complained of abdominal pain/pain with urination. Of note, has bilateral ureteral stents placed in 03/2022; she was supposed to follow up for stent exchange 01/2023 but did not present as scheduled. Additionally, husband notes she continues to use heroin via inhalation, denies injection.   On ED arrival, patient was afebrile, HR 111, BP 126/80, SpO2 100%. She was initially somnolent with GCS 12 but intermittently agitated/not following command, thrashing and causing risk to herself/staff. Ultimately patient was intubated for her safety/airway protection. Labs were notable for WBC 46.1, Hgb 10.8, Plt 704 (suspect concentrated), Na 130, K 5.7, CO2 < 7, BUN 158/Cr 12.13 (baseline ~4). Transaminases/Tbili WNL, Alk Phos mildly elevated. Lipase 133. TSH 4.046. LA 6.1 > 0.8 after IV fluids. CK 198. Ammonia 79, Ethanol < 10, APAP/salicylates negative. UDS pending. VBG 6.974/pCO2 23.4/bicarb 5.4. PCT 29.14, MRSA PCR positive (nares). BCx pending, cefepime/vanc initiated. CT  Chest/A/P demonstrated bilateral centrilobular micronodules (tree-in-bud) greatest in bilateral lower lobes, small amount pneumomediastinum (history of esophageal perf), thick-walled nondistended urinary bladder with Foley, bilateral hydronephrosis with bilateral ureteral stents.   PCCM consulted for ICU admission. Nephro consulted for ARF and emergent CRRT initiation.  Past Medical History  CKD stage IV Bilateral ureteral obstruction status post stent placement April 2024 Cervical cancer stage IIb s/p chemotherapy and radiation Heroin use  Significant Hospital Events   12/16 - Presented to [facility] ED with unresponsiveness, decreased PO intake. Unresponsiveness with intermittent agitation requiring intubation. Found to be in acute renal failure with Cr 12 (baseline ~4), BUN > 100. WBC 46. Briefly required Levophed prior to intubation. Broad-spectrum antibiotic started (cefepime/vanc). RIJ Trialysis catheter placed. CRRT started per Nephro. 12/17- IR placed bilateral percutaneous nephrostomy tubes and removed bilateral ureteral stents 12/19 started enteral methodone 12/20 did not tolerate coming of CRRT for a trial of iHD due to electrolyte derrangements 12/22 switched methadone to oxycodone due to transaminitis 12/24 family meeting, made DNR  Interim history/subjective:  No overnight issues. Stopped CRRT yesterday. Family at bedside ready to make comfort care  Objective   Blood pressure (!) 100/59, pulse (!) 108, temperature 99.4 F (37.4 C), temperature source Axillary, resp. rate (!) 32, height 5\' 6"  (1.676 m), weight 68 kg, SpO2 99%.    Vent Mode: PRVC FiO2 (%):  [40 %-100 %] 50 % Set Rate:  [32 bmp] 32 bmp Vt Set:  [470 mL-4701 mL] 4701 mL PEEP:  [5 cmH20] 5 cmH20 Pressure Support:  [10 cmH20] 10 cmH20 Plateau Pressure:  [19 cmH20] 19 cmH20   Intake/Output Summary (Last 24 hours) at 07/25/2023 0943 Last data filed at 07/25/2023 0900 Gross per 24 hour  Intake  2521.16 ml  Output  275 ml  Net 2246.16 ml    Filed Weights   07/23/23 0400 07/24/23 0500 07/25/23 0500  Weight: 66.5 kg 67.1 kg 68 kg    Examination: Gen:      Intubated, sedated, acutely and chronically ill appearing HEENT:  ETT to vent Lungs:    sounds of mechanical ventilation auscultated no wheeze CV:         tachycardic, regular Abd:      + bowel sounds; soft, non-tender; no palpable masses, no distension Ext:    No edema, decreased muscle bulk and tone Skin:      lower extremity wounds in badages Neuro:   sedated, RASS -2, moves all 4 extremities, moves purposefully but does not consistently follow commands     Labs/imaging reviewed    No new labs. Cbgs wnl  Resolved Hospital Problem list   Severe metabolic acidosis Uremia Hyperkalemia  Assessment & Plan:  42 y.o. year old female with pmh of cervical cancer s/p chemoradiation complicated by ureteral stenosis with stent placement, heroin abuse who present altered mental status and admitted to ICU 12/16 for septic shock.   Septic Shock Group A strep bacteremia, Bilateral lower extremity wounds Rhinovirus/MRSA pneumonia History of bilateral ureteral obstruction s/p nephrostomy tube placement Leukocytosis Concern for PRIS Oliguric Acute renal failure, dependent on RRT CKD stage IV Acute hypoxic respiratory failure Concern for aspiration pneumonitis Acute on chronic anemia History of polysubstance abuse with current heroin use, inhalation, cocaine Diabetes Small volume pneumomediastinum History of cervical cancer  Plan is for transition to comfort measures today. Family and pastor at bedside. Agreeable to transition. Comfort orders initiated.   Best practice:  Diet: NPO, tube feeds Pain/Anxiety/Delirium protocol (if indicated): morphine, ativan DVT prophylaxis: Heparin GI prophylaxis: PPI Glucose control: SSI Code Status: DNR Family Communication: family meeting at noon 12/24, comfort measures 12/25.   The patient is  critically ill due to respiratory failure, shock.  Critical care was necessary to treat or prevent imminent or life-threatening deterioration.  Critical care was time spent personally by me on the following activities: development of treatment plan with patient and/or surrogate as well as nursing, discussions with consultants, evaluation of patient's response to treatment, examination of patient, obtaining history from patient or surrogate, ordering and performing treatments and interventions, ordering and review of laboratory studies, ordering and review of radiographic studies, pulse oximetry, re-evaluation of patient's condition and participation in multidisciplinary rounds.   Critical Care Time devoted to patient care services described in this note is 31 minutes. This time reflects time of care of this signee Charlott Holler . This critical care time does not reflect separately billable procedures or procedure time, teaching time or supervisory time of PA/NP/Med student/Med Resident etc but could involve care discussion time.       Charlott Holler [place] [facility] 07/25/2023 9:43 AM  Pager: see AMION  If no response to pager , please call critical care on call (see AMION) until 7pm After 7:00 pm call Elink

## 2023-07-26 NOTE — Plan of Care (Signed)
Comfort care continued per family wishes and MD orders.

## 2023-07-26 NOTE — Progress Notes (Signed)
NAME:  Marie Brewer, MRN:  [id number], DOB:  06-23-1981, LOS: 10 ADMISSION DATE:  07/16/2023, CONSULTATION DATE:  07/16/23 REFERRING MD:  Elisabeth Pigeon, CHIEF COMPLAINT:  Sepsis, presumed urosepsis   History of present illness   42 year old woman who presented to [facility] ED 12/16 for unresponsiveness. PMHx significant for CKD stage 4, bilateral ureteral obstruction (s/p stent placement 10/2022), cervical CA stage IIB (s/p chemotherapy/XRT, followed at [facility]), polysubstance abuse (current heroin use via inhalation).   Patient presented to [facility] ED with decreased responsiveness x 3 days, per patient's husband (at bedside). Has not been feeling well for about a month but is often hesitant to come to the ED and was adamantly refusing going to the hospital. PO intake has been poor, has been only drinking [facility] and not eating much. No reported fevers at home, but chills/general malaise. Intermittent nausea, no vomiting/diarrhea. Per husband, has complained of abdominal pain/pain with urination. Of note, has bilateral ureteral stents placed in 03/2022; she was supposed to follow up for stent exchange 01/2023 but did not present as scheduled. Additionally, husband notes she continues to use heroin via inhalation, denies injection.   On ED arrival, patient was afebrile, HR 111, BP 126/80, SpO2 100%. She was initially somnolent with GCS 12 but intermittently agitated/not following command, thrashing and causing risk to herself/staff. Ultimately patient was intubated for her safety/airway protection. Labs were notable for WBC 46.1, Hgb 10.8, Plt 704 (suspect concentrated), Na 130, K 5.7, CO2 < 7, BUN 158/Cr 12.13 (baseline ~4). Transaminases/Tbili WNL, Alk Phos mildly elevated. Lipase 133. TSH 4.046. LA 6.1 > 0.8 after IV fluids. CK 198. Ammonia 79, Ethanol < 10, APAP/salicylates negative. UDS pending. VBG 6.974/pCO2 23.4/bicarb 5.4. PCT 29.14, MRSA PCR positive (nares). BCx pending, cefepime/vanc initiated. CT  Chest/A/P demonstrated bilateral centrilobular micronodules (tree-in-bud) greatest in bilateral lower lobes, small amount pneumomediastinum (history of esophageal perf), thick-walled nondistended urinary bladder with Foley, bilateral hydronephrosis with bilateral ureteral stents.   PCCM consulted for ICU admission. Nephro consulted for ARF and emergent CRRT initiation.  Past Medical History  CKD stage IV Bilateral ureteral obstruction status post stent placement April 2024 Cervical cancer stage IIb s/p chemotherapy and radiation Heroin use  Significant Hospital Events   12/16 - Presented to [facility] ED with unresponsiveness, decreased PO intake. Unresponsiveness with intermittent agitation requiring intubation. Found to be in acute renal failure with Cr 12 (baseline ~4), BUN > 100. WBC 46. Briefly required Levophed prior to intubation. Broad-spectrum antibiotic started (cefepime/vanc). RIJ Trialysis catheter placed. CRRT started per Nephro. 12/17- IR placed bilateral percutaneous nephrostomy tubes and removed bilateral ureteral stents 12/19 started enteral methodone 12/20 did not tolerate coming of CRRT for a trial of iHD due to electrolyte derrangements 12/22 switched methadone to oxycodone due to transaminitis 12/24 family meeting, made DNR 12/25 palliative extubation  Interim history/subjective:  Extubated yesterday. Family at bedside, husband, mother brother  Objective   Blood pressure (!) 79/40, pulse (!) 111, temperature 99.4 F (37.4 C), temperature source Axillary, resp. rate 17, height 5\' 6"  (1.676 m), weight 68 kg, SpO2 (!) 75%.    Vent Mode: PRVC FiO2 (%):  [50 %] 50 % Set Rate:  [12 bmp] 12 bmp PEEP:  [5 cmH20] 5 cmH20 Plateau Pressure:  [15 cmH20] 15 cmH20   Intake/Output Summary (Last 24 hours) at 07/26/2023 1020 Last data filed at 07/26/2023 0700 Gross per 24 hour  Intake 1486.29 ml  Output 390 ml  Net 1096.29 ml    Filed Weights   07/23/23 0400  07/24/23 0500  07/25/23 0500  Weight: 66.5 kg 67.1 kg 68 kg    Examination: Resting comfortably, no respiratory distress Appears to be actively dying   Resolved Hospital Problem list   Severe metabolic acidosis Uremia Hyperkalemia  Assessment & Plan:  42 y.o. year old female with pmh of cervical cancer s/p chemoradiation complicated by ureteral stenosis with stent placement, heroin abuse who present altered mental status and admitted to ICU 12/16 for septic shock.   Septic Shock Group A strep bacteremia, Bilateral lower extremity wounds Rhinovirus/MRSA pneumonia History of bilateral ureteral obstruction s/p nephrostomy tube placement Leukocytosis Concern for PRIS Oliguric Acute renal failure, dependent on RRT CKD stage IV Acute hypoxic respiratory failure Concern for aspiration pneumonitis Acute on chronic anemia History of polysubstance abuse with current heroin use, inhalation, cocaine Diabetes Small volume pneumomediastinum History of cervical cancer  Continue comfort measures. On versed, fentanyl, dilaudid gtt. Family support provided. Would be appropriate for transfer to 6N.   I spent 35 minutes in total visit time for this patient, with more than 50% spent counseling/coordinating care.  Durel Salts, MD Pulmonary and Critical Care Medicine [facility] 07/26/2023 10:27 AM Pager: see AMION  If no response to pager, please call critical care on call (see AMION) until 7pm After 7:00 pm call Elink

## 2023-07-26 NOTE — Progress Notes (Signed)
Nutrition Brief Note  Chart reviewed. Pt now transitioning to comfort care.  No further nutrition interventions planned at this time.  Please re-consult as needed.   Greig Castilla, RD, LDN Registered Dietitian II Please reach out via secure chat Weekend on-call pager # available in [facility]

## 2023-07-27 MED ORDER — MIDAZOLAM HCL 2 MG/2ML IJ SOLN
2.0000 mg | INTRAMUSCULAR | Status: DC | PRN
  Administered 2023-07-27: 2 mg via INTRAVENOUS
  Filled 2023-07-27: qty 2

## 2023-07-27 NOTE — Plan of Care (Signed)
Unable to assess patient orientation level. Patient remains nonverbal. Patient tolerating cont. Fentanyl and Dilaudid pain medication drip. Patient nephrostomy tubes remain intact along with fecal management tube. Patient turned Q2 hours as tolerated. Skin assessed with Carley Hammed, RN. Sacral wound noted along with breakdown on B/L legs and B/L heels. Comfort measures remain in place. VSS. Patient does not seem to be in distress.   Problem: Education: Goal: Knowledge of General Education information will improve Description: Including pain rating scale, medication(s)/side effects and non-pharmacologic comfort measures Outcome: Progressing   Problem: Health Behavior/Discharge Planning: Goal: Ability to manage health-related needs will improve Outcome: Progressing   Problem: Clinical Measurements: Goal: Ability to maintain clinical measurements within normal limits will improve Outcome: Progressing   Problem: Clinical Measurements: Goal: Will remain free from infection Outcome: Progressing   Problem: Clinical Measurements: Goal: Diagnostic test results will improve Outcome: Progressing   Problem: Clinical Measurements: Goal: Respiratory complications will improve Outcome: Progressing   Problem: Clinical Measurements: Goal: Cardiovascular complication will be avoided Outcome: Progressing   Problem: Activity: Goal: Risk for activity intolerance will decrease Outcome: Progressing   Problem: Nutrition: Goal: Adequate nutrition will be maintained Outcome: Progressing   Problem: Coping: Goal: Level of anxiety will decrease Outcome: Progressing   Problem: Elimination: Goal: Will not experience complications related to bowel motility Outcome: Progressing   Problem: Elimination: Goal: Will not experience complications related to urinary retention Outcome: Progressing   Problem: Pain Management: Goal: General experience of comfort will improve Outcome: Progressing   Problem:  Safety: Goal: Ability to remain free from injury will improve Outcome: Progressing   Problem: Skin Integrity: Goal: Risk for impaired skin integrity will decrease Outcome: Progressing

## 2023-07-27 NOTE — Progress Notes (Signed)
NAME:  Marie Brewer, MRN:  [id number], DOB:  24-Dec-1980, LOS: 11 ADMISSION DATE:  07/16/2023, CONSULTATION DATE:  07/16/23 REFERRING MD:  Countryman-EDP, CHIEF COMPLAINT:  Sepsis, presumed urosepsis   History of present illness   42 year old woman who presented to [facility] ED 12/16 for unresponsiveness. PMHx significant for CKD stage 4, bilateral ureteral obstruction (s/p stent placement 10/2022), cervical CA stage IIB (s/p chemotherapy/XRT, followed at [facility]), polysubstance abuse (current heroin use via inhalation).   Patient presented to [facility] ED with decreased responsiveness x 3 days, per patient's husband (at bedside). Has not been feeling well for about a month but is often hesitant to come to the ED and was adamantly refusing going to the hospital. PO intake has been poor, has been only drinking [facility] and not eating much. No reported fevers at home, but chills/general malaise. Intermittent nausea, no vomiting/diarrhea. Per husband, has complained of abdominal pain/pain with urination. Of note, has bilateral ureteral stents placed in 03/2022; she was supposed to follow up for stent exchange 01/2023 but did not present as scheduled. Additionally, husband notes she continues to use heroin via inhalation, denies injection.   On ED arrival, patient was afebrile, HR 111, BP 126/80, SpO2 100%. She was initially somnolent with GCS 12 but intermittently agitated/not following command, thrashing and causing risk to herself/staff. Ultimately patient was intubated for her safety/airway protection. Labs were notable for WBC 46.1, Hgb 10.8, Plt 704 (suspect concentrated), Na 130, K 5.7, CO2 < 7, BUN 158/Cr 12.13 (baseline ~4). Transaminases/Tbili WNL, Alk Phos mildly elevated. Lipase 133. TSH 4.046. LA 6.1 > 0.8 after IV fluids. CK 198. Ammonia 79, Ethanol < 10, APAP/salicylates negative. UDS pending. VBG 6.974/pCO2 23.4/bicarb 5.4. PCT 29.14, MRSA PCR positive (nares). BCx pending, cefepime/vanc initiated. CT  Chest/A/P demonstrated bilateral centrilobular micronodules (tree-in-bud) greatest in bilateral lower lobes, small amount pneumomediastinum (history of esophageal perf), thick-walled nondistended urinary bladder with Foley, bilateral hydronephrosis with bilateral ureteral stents.   PCCM consulted for ICU admission. Nephro consulted for ARF and emergent CRRT initiation.  Past Medical History  CKD stage IV Bilateral ureteral obstruction status post stent placement April 2024 Cervical cancer stage IIb s/p chemotherapy and radiation Heroin use  Significant Hospital Events   12/16 - Presented to [facility] ED with unresponsiveness, decreased PO intake. Unresponsiveness with intermittent agitation requiring intubation. Found to be in acute renal failure with Cr 12 (baseline ~4), BUN > 100. WBC 46. Briefly required Levophed prior to intubation. Broad-spectrum antibiotic started (cefepime/vanc). RIJ Trialysis catheter placed. CRRT started per Nephro. 12/17- IR placed bilateral percutaneous nephrostomy tubes and removed bilateral ureteral stents 12/19 started enteral methodone 12/20 did not tolerate coming of CRRT for a trial of iHD due to electrolyte derrangements 12/22 switched methadone to oxycodone due to transaminitis 12/24 family meeting, made DNR 12/25 palliative extubation  Interim history/subjective:  Remains on fentanyl/ dilaudid gtt.  Versed gtt stopped, changed to pushes  Objective   Blood pressure (!) 92/46, pulse (!) 104, temperature (!) 103 F (39.4 C), temperature source Axillary, resp. rate (!) 22, height 5\' 6"  (1.676 m), weight 66.6 kg, SpO2 (!) 85%.        Intake/Output Summary (Last 24 hours) at 07/27/2023 1137 Last data filed at 07/27/2023 0700 Gross per 24 hour  Intake 676.75 ml  Output --  Net 676.75 ml    Filed Weights   07/24/23 0500 07/25/23 0500 07/27/23 0248  Weight: 67.1 kg 68 kg 66.6 kg    Examination: General:  older appearing female lying in  bed, NAD Neuro:  unresponsive CV: rr, HR borderline ST PULM:  non labored, clear anteriorly, RA GI: soft, hypoBS, FMS Extremities: warm/dry, dressings to BLE   Resolved Hospital Problem list   Severe metabolic acidosis Uremia Hyperkalemia  Assessment & Plan:  42 y.o. year old female with pmh of cervical cancer s/p chemoradiation complicated by ureteral stenosis with stent placement, heroin abuse who present altered mental status and admitted to ICU 12/16 for septic shock.   Septic Shock Group A strep bacteremia, Bilateral lower extremity wounds Rhinovirus/MRSA pneumonia History of bilateral ureteral obstruction s/p nephrostomy tube placement Leukocytosis Concern for PRIS Oliguric Acute renal failure, dependent on RRT CKD stage IV Acute hypoxic respiratory failure Concern for aspiration pneumonitis Acute on chronic anemia History of polysubstance abuse with current heroin use, inhalation, cocaine Diabetes Small volume pneumomediastinum History of cervical cancer - comfort - off versed gtt.  Continue prn versed prn - cont dilaudid and fentanyl gtts for comfort, prn fentanyl pushes - prn tylenol, robinul  - ongoing family support - BP relatively stable in 80's, RA sats 80's.  tx to palliative floor and to [facility] 12/28.    Husband stepped out per bedside RN, pending update 12/27     Posey Boyer, MSN, AG-ACNP-BC [place] [facility] 07/27/2023, 11:37 AM  See Amion for pager If no response to pager , please call [phone] until 7pm After 7:00 pm call Elink  [phone]

## 2023-08-01 DEATH — deceased

## 2023-08-07 LAB — FUNGUS CULTURE RESULT

## 2023-08-07 LAB — FUNGUS CULTURE WITH STAIN

## 2023-08-07 LAB — FUNGAL ORGANISM REFLEX

## 2023-09-02 LAB — ACID FAST CULTURE WITH REFLEXED SENSITIVITIES (MYCOBACTERIA): Acid Fast Culture: NEGATIVE

## 2023-11-07 ENCOUNTER — Other Ambulatory Visit (HOSPITAL_BASED_OUTPATIENT_CLINIC_OR_DEPARTMENT_OTHER): Payer: Self-pay
